# Patient Record
Sex: Male | Born: 1940 | Race: Black or African American | Hispanic: No | Smoking: Former smoker
Health system: Southern US, Community
[De-identification: ages and names within clinical notes are randomized; demographics above are authoritative.]

## PROBLEM LIST (undated history)

## (undated) DIAGNOSIS — I10 Essential (primary) hypertension: Secondary | ICD-10-CM

## (undated) DIAGNOSIS — K279 Peptic ulcer, site unspecified, unspecified as acute or chronic, without hemorrhage or perforation: Secondary | ICD-10-CM

## (undated) DIAGNOSIS — Z789 Other specified health status: Secondary | ICD-10-CM

## (undated) DIAGNOSIS — I2510 Atherosclerotic heart disease of native coronary artery without angina pectoris: Secondary | ICD-10-CM

## (undated) DIAGNOSIS — C801 Malignant (primary) neoplasm, unspecified: Secondary | ICD-10-CM

## (undated) DIAGNOSIS — J189 Pneumonia, unspecified organism: Secondary | ICD-10-CM

## (undated) DIAGNOSIS — J449 Chronic obstructive pulmonary disease, unspecified: Secondary | ICD-10-CM

## (undated) DIAGNOSIS — R918 Other nonspecific abnormal finding of lung field: Secondary | ICD-10-CM

## (undated) DIAGNOSIS — M1990 Unspecified osteoarthritis, unspecified site: Secondary | ICD-10-CM

## (undated) DIAGNOSIS — E119 Type 2 diabetes mellitus without complications: Secondary | ICD-10-CM

## (undated) DIAGNOSIS — F17200 Nicotine dependence, unspecified, uncomplicated: Secondary | ICD-10-CM

## (undated) DIAGNOSIS — R0600 Dyspnea, unspecified: Secondary | ICD-10-CM

## (undated) DIAGNOSIS — E785 Hyperlipidemia, unspecified: Secondary | ICD-10-CM

## (undated) DIAGNOSIS — M109 Gout, unspecified: Secondary | ICD-10-CM

## (undated) DIAGNOSIS — K922 Gastrointestinal hemorrhage, unspecified: Secondary | ICD-10-CM

## (undated) HISTORY — DX: Type 2 diabetes mellitus without complications: E11.9

## (undated) HISTORY — DX: Peptic ulcer, site unspecified, unspecified as acute or chronic, without hemorrhage or perforation: K27.9

## (undated) HISTORY — DX: Essential (primary) hypertension: I10

## (undated) HISTORY — DX: Hyperlipidemia, unspecified: E78.5

## (undated) HISTORY — PX: CARDIAC CATHETERIZATION: SHX172

## (undated) HISTORY — DX: Nicotine dependence, unspecified, uncomplicated: F17.200

## (undated) HISTORY — DX: Gout, unspecified: M10.9

## (undated) HISTORY — DX: Atherosclerotic heart disease of native coronary artery without angina pectoris: I25.10

## (undated) HISTORY — DX: Gastrointestinal hemorrhage, unspecified: K92.2

## (undated) HISTORY — PX: CORONARY STENT PLACEMENT: SHX1402

---

## 2003-08-08 ENCOUNTER — Encounter: Payer: Self-pay | Admitting: Cardiology

## 2003-09-26 ENCOUNTER — Ambulatory Visit (HOSPITAL_COMMUNITY): Admission: RE | Admit: 2003-09-26 | Discharge: 2003-09-28 | Payer: Self-pay | Admitting: Cardiology

## 2004-10-28 ENCOUNTER — Ambulatory Visit: Payer: Self-pay | Admitting: Endocrinology

## 2004-11-05 ENCOUNTER — Ambulatory Visit: Payer: Self-pay | Admitting: Endocrinology

## 2004-11-12 ENCOUNTER — Ambulatory Visit: Payer: Self-pay | Admitting: Endocrinology

## 2004-12-24 ENCOUNTER — Ambulatory Visit: Payer: Self-pay | Admitting: Internal Medicine

## 2004-12-31 ENCOUNTER — Ambulatory Visit: Payer: Self-pay | Admitting: Endocrinology

## 2005-01-04 ENCOUNTER — Encounter: Payer: Self-pay | Admitting: Cardiovascular Disease

## 2005-01-04 ENCOUNTER — Ambulatory Visit: Payer: Self-pay | Admitting: Internal Medicine

## 2005-01-04 ENCOUNTER — Ambulatory Visit: Payer: Self-pay

## 2005-01-06 ENCOUNTER — Ambulatory Visit: Payer: Self-pay | Admitting: Internal Medicine

## 2005-01-07 ENCOUNTER — Ambulatory Visit: Payer: Self-pay | Admitting: Internal Medicine

## 2005-01-10 ENCOUNTER — Ambulatory Visit: Payer: Self-pay | Admitting: *Deleted

## 2005-01-11 ENCOUNTER — Ambulatory Visit (HOSPITAL_COMMUNITY): Admission: RE | Admit: 2005-01-11 | Discharge: 2005-01-12 | Payer: Self-pay | Admitting: *Deleted

## 2005-01-27 ENCOUNTER — Ambulatory Visit: Payer: Self-pay | Admitting: Internal Medicine

## 2005-03-11 ENCOUNTER — Ambulatory Visit: Payer: Self-pay | Admitting: Internal Medicine

## 2005-04-08 ENCOUNTER — Ambulatory Visit: Payer: Self-pay | Admitting: Internal Medicine

## 2005-08-05 ENCOUNTER — Ambulatory Visit: Payer: Self-pay | Admitting: Internal Medicine

## 2005-09-05 ENCOUNTER — Ambulatory Visit: Payer: Self-pay | Admitting: Endocrinology

## 2005-11-11 ENCOUNTER — Ambulatory Visit: Payer: Self-pay | Admitting: Endocrinology

## 2005-11-18 ENCOUNTER — Ambulatory Visit: Payer: Self-pay | Admitting: Endocrinology

## 2005-12-16 ENCOUNTER — Ambulatory Visit: Payer: Self-pay | Admitting: Endocrinology

## 2005-12-29 ENCOUNTER — Ambulatory Visit: Payer: Self-pay | Admitting: Endocrinology

## 2006-03-06 ENCOUNTER — Ambulatory Visit (HOSPITAL_COMMUNITY): Admission: RE | Admit: 2006-03-06 | Discharge: 2006-03-06 | Payer: Self-pay | Admitting: Endocrinology

## 2006-03-06 ENCOUNTER — Ambulatory Visit: Payer: Self-pay | Admitting: Endocrinology

## 2006-04-07 ENCOUNTER — Ambulatory Visit: Payer: Self-pay | Admitting: Endocrinology

## 2006-04-25 ENCOUNTER — Ambulatory Visit: Payer: Self-pay | Admitting: Endocrinology

## 2006-05-02 ENCOUNTER — Ambulatory Visit: Payer: Self-pay | Admitting: Internal Medicine

## 2006-05-18 ENCOUNTER — Ambulatory Visit: Payer: Self-pay | Admitting: Internal Medicine

## 2006-05-23 ENCOUNTER — Ambulatory Visit: Payer: Self-pay | Admitting: Endocrinology

## 2006-05-23 ENCOUNTER — Ambulatory Visit: Payer: Self-pay | Admitting: Internal Medicine

## 2006-06-19 ENCOUNTER — Ambulatory Visit: Payer: Self-pay | Admitting: Internal Medicine

## 2006-06-19 ENCOUNTER — Encounter (INDEPENDENT_AMBULATORY_CARE_PROVIDER_SITE_OTHER): Payer: Self-pay | Admitting: Specialist

## 2006-06-26 ENCOUNTER — Ambulatory Visit: Payer: Self-pay | Admitting: Internal Medicine

## 2006-11-28 ENCOUNTER — Ambulatory Visit: Payer: Self-pay | Admitting: Endocrinology

## 2006-11-28 LAB — CONVERTED CEMR LAB
Albumin: 3.8 g/dL (ref 3.5–5.2)
Alkaline Phosphatase: 80 units/L (ref 39–117)
Basophils Absolute: 0.1 10*3/uL (ref 0.0–0.1)
CO2: 28 meq/L (ref 19–32)
Glomerular Filtration Rate, Af Am: 126 mL/min/{1.73_m2}
Glucose, Bld: 106 mg/dL — ABNORMAL HIGH (ref 70–99)
Ketones, ur: NEGATIVE mg/dL
LDL Cholesterol: 54 mg/dL (ref 0–99)
Lymphocytes Relative: 33.6 % (ref 12.0–46.0)
Monocytes Relative: 14.3 % — ABNORMAL HIGH (ref 3.0–11.0)
Nitrite: NEGATIVE
Platelets: 331 10*3/uL (ref 150–400)
Potassium: 4 meq/L (ref 3.5–5.1)
Sodium: 136 meq/L (ref 135–145)
Specific Gravity, Urine: 1.02 (ref 1.000–1.03)
TSH: 0.95 microintl units/mL (ref 0.35–5.50)
Total Bilirubin: 0.6 mg/dL (ref 0.3–1.2)
Total Protein: 7.2 g/dL (ref 6.0–8.3)
Uric Acid, Serum: 3.9 mg/dL (ref 2.4–7.0)
Urobilinogen, UA: 0.2 (ref 0.0–1.0)
VLDL: 7 mg/dL (ref 0–40)
pH: 6 (ref 5.0–8.0)

## 2006-12-08 ENCOUNTER — Ambulatory Visit: Payer: Self-pay | Admitting: Endocrinology

## 2007-02-09 ENCOUNTER — Ambulatory Visit: Payer: Self-pay | Admitting: Internal Medicine

## 2007-03-09 ENCOUNTER — Ambulatory Visit: Payer: Self-pay | Admitting: Endocrinology

## 2007-09-14 ENCOUNTER — Ambulatory Visit: Payer: Self-pay | Admitting: Internal Medicine

## 2007-10-10 ENCOUNTER — Ambulatory Visit: Payer: Self-pay

## 2007-10-10 ENCOUNTER — Ambulatory Visit: Payer: Self-pay | Admitting: Internal Medicine

## 2007-10-10 LAB — CONVERTED CEMR LAB
HDL: 57.1 mg/dL (ref >39.0)
LDL Cholesterol: 58 mg/dL (ref 0–99)
Triglycerides: 52 mg/dL (ref 0–149)
VLDL: 10 mg/dL (ref 0–40)

## 2008-01-21 ENCOUNTER — Ambulatory Visit: Payer: Self-pay | Admitting: Endocrinology

## 2008-01-21 DIAGNOSIS — M109 Gout, unspecified: Secondary | ICD-10-CM

## 2008-01-21 LAB — CONVERTED CEMR LAB
ALT: 18 units/L (ref 0–53)
AST: 20 units/L (ref 0–37)
Albumin: 3.4 g/dL — ABNORMAL LOW (ref 3.5–5.2)
Bilirubin, Direct: 0.1 mg/dL (ref 0.0–0.3)
Calcium: 9.1 mg/dL (ref 8.4–10.5)
Chloride: 95 meq/L — ABNORMAL LOW (ref 96–112)
Cholesterol: 110 mg/dL (ref 0–200)
Creatinine, Ser: 0.9 mg/dL (ref 0.4–1.5)
Creatinine,U: 154 mg/dL
Eosinophils Relative: 2.4 % (ref 0.0–5.0)
Glucose, Bld: 111 mg/dL — ABNORMAL HIGH (ref 70–99)
HCT: 41.3 % (ref 39.0–52.0)
Ketones, ur: NEGATIVE mg/dL
LDL Cholesterol: 53 mg/dL (ref 0–99)
Neutrophils Relative %: 55.6 % (ref 43.0–77.0)
Nitrite: NEGATIVE
RBC: 4.24 M/uL (ref 4.22–5.81)
RDW: 14.1 % (ref 11.5–14.6)
Sodium: 139 meq/L (ref 135–145)
Specific Gravity, Urine: 1.015 (ref 1.000–1.03)
TSH: 0.83 microintl units/mL (ref 0.35–5.50)
Total Bilirubin: 0.6 mg/dL (ref 0.3–1.2)
Total Protein, Urine: NEGATIVE mg/dL
Uric Acid, Serum: 5.1 mg/dL (ref 2.4–7.0)
Urobilinogen, UA: 0.2 (ref 0.0–1.0)
VLDL: 11 mg/dL (ref 0–40)
WBC: 11.8 10*3/uL — ABNORMAL HIGH (ref 4.5–10.5)
pH: 7 (ref 5.0–8.0)

## 2008-01-28 ENCOUNTER — Ambulatory Visit: Payer: Self-pay | Admitting: Endocrinology

## 2008-01-28 DIAGNOSIS — R05 Cough: Secondary | ICD-10-CM | POA: Insufficient documentation

## 2008-01-28 DIAGNOSIS — R059 Cough, unspecified: Secondary | ICD-10-CM | POA: Insufficient documentation

## 2008-03-31 ENCOUNTER — Ambulatory Visit: Payer: Self-pay | Admitting: Internal Medicine

## 2008-05-14 ENCOUNTER — Ambulatory Visit: Payer: Self-pay | Admitting: Internal Medicine

## 2008-05-14 DIAGNOSIS — E785 Hyperlipidemia, unspecified: Secondary | ICD-10-CM

## 2008-05-14 DIAGNOSIS — K279 Peptic ulcer, site unspecified, unspecified as acute or chronic, without hemorrhage or perforation: Secondary | ICD-10-CM

## 2008-05-14 DIAGNOSIS — I10 Essential (primary) hypertension: Secondary | ICD-10-CM

## 2008-05-14 DIAGNOSIS — M79609 Pain in unspecified limb: Secondary | ICD-10-CM

## 2008-05-14 DIAGNOSIS — I2510 Atherosclerotic heart disease of native coronary artery without angina pectoris: Secondary | ICD-10-CM | POA: Insufficient documentation

## 2008-09-23 ENCOUNTER — Ambulatory Visit: Payer: Self-pay | Admitting: Endocrinology

## 2008-09-23 LAB — CONVERTED CEMR LAB: Hgb A1c MFr Bld: 6.6 % — ABNORMAL HIGH (ref 4.6–6.0)

## 2008-10-27 ENCOUNTER — Ambulatory Visit: Payer: Self-pay | Admitting: Internal Medicine

## 2008-11-28 ENCOUNTER — Telehealth (INDEPENDENT_AMBULATORY_CARE_PROVIDER_SITE_OTHER): Payer: Self-pay | Admitting: *Deleted

## 2009-03-24 ENCOUNTER — Ambulatory Visit: Payer: Self-pay | Admitting: Endocrinology

## 2009-03-26 LAB — CONVERTED CEMR LAB: Uric Acid, Serum: 4.3 mg/dL (ref 4.0–7.8)

## 2009-05-29 ENCOUNTER — Ambulatory Visit: Payer: Self-pay | Admitting: Internal Medicine

## 2009-06-03 ENCOUNTER — Ambulatory Visit: Payer: Self-pay | Admitting: Endocrinology

## 2009-06-03 DIAGNOSIS — E876 Hypokalemia: Secondary | ICD-10-CM | POA: Insufficient documentation

## 2009-06-03 LAB — CONVERTED CEMR LAB
ALT: 21 units/L (ref 0–53)
AST: 23 units/L (ref 0–37)
Albumin: 3.7 g/dL (ref 3.5–5.2)
Alkaline Phosphatase: 79 units/L (ref 39–117)
Basophils Relative: 1.2 % (ref 0.0–3.0)
Calcium: 9.2 mg/dL (ref 8.4–10.5)
Eosinophils Relative: 2.4 % (ref 0.0–5.0)
GFR calc non Af Amer: 173.61 mL/min (ref >60)
Glucose, Bld: 91 mg/dL (ref 70–99)
HCT: 39.8 % (ref 39.0–52.0)
HDL: 57.8 mg/dL (ref >39.00)
Hemoglobin: 13.7 g/dL (ref 13.0–17.0)
Hgb A1c MFr Bld: 6.5 % (ref 4.6–6.5)
Lymphocytes Relative: 31.7 % (ref 12.0–46.0)
Lymphs Abs: 2.7 10*3/uL (ref 0.7–4.0)
Microalb Creat Ratio: 13.1 mg/g (ref 0.0–30.0)
Microalb, Ur: 1.5 mg/dL (ref 0.0–1.9)
Monocytes Relative: 15.2 % — ABNORMAL HIGH (ref 3.0–12.0)
Neutro Abs: 4.2 10*3/uL (ref 1.4–7.7)
PSA: 3 ng/mL (ref 0.10–4.00)
Potassium: 3.4 meq/L — ABNORMAL LOW (ref 3.5–5.1)
RBC: 4.04 M/uL — ABNORMAL LOW (ref 4.22–5.81)
Sodium: 140 meq/L (ref 135–145)
TSH: 0.56 microintl units/mL (ref 0.35–5.50)
Total CHOL/HDL Ratio: 2
WBC: 8.5 10*3/uL (ref 4.5–10.5)

## 2009-07-23 ENCOUNTER — Ambulatory Visit: Payer: Self-pay | Admitting: Internal Medicine

## 2009-07-23 LAB — CONVERTED CEMR LAB
BUN: 14 mg/dL (ref 6–23)
Chloride: 102 meq/L (ref 96–112)
Creatinine, Ser: 0.9 mg/dL (ref 0.4–1.5)
GFR calc non Af Amer: 108.69 mL/min (ref >60)
Glucose, Bld: 82 mg/dL (ref 70–99)
Potassium: 3.6 meq/L (ref 3.5–5.1)

## 2009-10-01 ENCOUNTER — Ambulatory Visit: Payer: Self-pay | Admitting: Endocrinology

## 2009-10-01 DIAGNOSIS — L918 Other hypertrophic disorders of the skin: Secondary | ICD-10-CM

## 2009-10-01 DIAGNOSIS — L908 Other atrophic disorders of skin: Secondary | ICD-10-CM

## 2009-10-07 ENCOUNTER — Telehealth (INDEPENDENT_AMBULATORY_CARE_PROVIDER_SITE_OTHER): Payer: Self-pay | Admitting: *Deleted

## 2010-01-26 ENCOUNTER — Telehealth (INDEPENDENT_AMBULATORY_CARE_PROVIDER_SITE_OTHER): Payer: Self-pay | Admitting: *Deleted

## 2010-03-15 ENCOUNTER — Ambulatory Visit: Payer: Self-pay | Admitting: Internal Medicine

## 2010-03-15 DIAGNOSIS — R5381 Other malaise: Secondary | ICD-10-CM

## 2010-03-15 DIAGNOSIS — I2510 Atherosclerotic heart disease of native coronary artery without angina pectoris: Secondary | ICD-10-CM

## 2010-03-15 DIAGNOSIS — R5383 Other fatigue: Secondary | ICD-10-CM

## 2010-03-15 DIAGNOSIS — F17200 Nicotine dependence, unspecified, uncomplicated: Secondary | ICD-10-CM

## 2010-03-16 ENCOUNTER — Telehealth: Payer: Self-pay | Admitting: Internal Medicine

## 2010-04-19 ENCOUNTER — Encounter: Payer: Self-pay | Admitting: Endocrinology

## 2010-07-14 ENCOUNTER — Ambulatory Visit: Payer: Self-pay | Admitting: Endocrinology

## 2010-07-14 DIAGNOSIS — R21 Rash and other nonspecific skin eruption: Secondary | ICD-10-CM | POA: Insufficient documentation

## 2010-08-11 ENCOUNTER — Ambulatory Visit: Payer: Self-pay | Admitting: Endocrinology

## 2010-08-11 ENCOUNTER — Encounter: Payer: Self-pay | Admitting: Endocrinology

## 2010-08-11 DIAGNOSIS — R972 Elevated prostate specific antigen [PSA]: Secondary | ICD-10-CM

## 2010-08-11 LAB — CONVERTED CEMR LAB
Bilirubin Urine: NEGATIVE
Creatinine,U: 42.8 mg/dL
Hemoglobin, Urine: NEGATIVE
Hgb A1c MFr Bld: 6.6 % — ABNORMAL HIGH (ref 4.6–6.5)
Microalb Creat Ratio: 3.5 mg/g (ref 0.0–30.0)
Microalb, Ur: 1.5 mg/dL (ref 0.0–1.9)
Nitrite: NEGATIVE
Total Protein, Urine: NEGATIVE mg/dL
Urobilinogen, UA: 0.2 (ref 0.0–1.0)

## 2010-09-08 ENCOUNTER — Encounter: Payer: Self-pay | Admitting: Endocrinology

## 2010-09-08 ENCOUNTER — Encounter: Payer: Self-pay | Admitting: Internal Medicine

## 2011-01-09 LAB — CONVERTED CEMR LAB
AST: 25 units/L (ref 0–37)
BUN: 13 mg/dL (ref 6–23)
Basophils Absolute: 0.1 10*3/uL (ref 0.0–0.1)
CO2: 35 meq/L — ABNORMAL HIGH (ref 19–32)
Calcium: 8.7 mg/dL (ref 8.4–10.5)
Calcium: 9 mg/dL (ref 8.4–10.5)
Cholesterol: 121 mg/dL (ref 0–200)
Creatinine, Ser: 0.8 mg/dL (ref 0.4–1.5)
Creatinine, Ser: 0.86 mg/dL (ref 0.40–1.50)
Eosinophils Absolute: 0.2 10*3/uL (ref 0.0–0.7)
GFR calc non Af Amer: 124.26 mL/min (ref >60)
Glucose, Bld: 105 mg/dL — ABNORMAL HIGH (ref 70–99)
HDL: 61.8 mg/dL (ref >39.00)
Lymphocytes Relative: 29.4 % (ref 12.0–46.0)
MCHC: 33.5 g/dL (ref 30.0–36.0)
Monocytes Relative: 13.7 % — ABNORMAL HIGH (ref 3.0–12.0)
Neutrophils Relative %: 53.4 % (ref 43.0–77.0)
Platelets: 306 10*3/uL (ref 150.0–400.0)
Potassium: 3.4 meq/L — ABNORMAL LOW (ref 3.5–5.3)
RDW: 15.8 % — ABNORMAL HIGH (ref 11.5–14.6)
Sodium: 142 meq/L (ref 135–145)
Triglycerides: 46 mg/dL (ref 0.0–149.0)
VLDL: 9.2 mg/dL (ref 0.0–40.0)

## 2011-01-13 NOTE — Progress Notes (Signed)
Summary: PT WANTS LAB RESULTS   Phone Note Call from Patient Call back at Home Phone [phone]   Reason for Call: Talk to Nurse, Talk to Doctor, Lab or Test Results Summary of Call: PT CALL TO GET LAB RESULTS FROM YESTERDAY Initial call taken by: Omer Jack,  March 16, 2010 11:59 AM  Follow-up for Phone Call        [facility] with normal lab results. Follow-up by: Suzan Garibaldi RN

## 2011-01-13 NOTE — Letter (Signed)
Summary: [facility] Office Visit Note   [facility] Office Visit Note   Imported By: Roderic Ovens 09/27/2010 11:19:09  _____________________________________________________________________  External Attachment:    Type:   Image     Comment:   External Document

## 2011-01-13 NOTE — Progress Notes (Signed)
Summary: refill   Phone Note Refill Request Message from:  Patient on January 26, 2010 1:34 PM  Refills Requested: Medication #1:  PLAVIX 75 MG TABS 1 tab once daily [facility] [phone]  Initial call taken by: Judie Grieve,  January 26, 2010 1:35 PM  Follow-up for Phone Call        Rx faxed to pharmacy Follow-up by: Oswald Hillock,  January 27, 2010 10:51 AM    Prescriptions: PLAVIX 75 MG TABS (CLOPIDOGREL BISULFATE) 1 tab once daily  #30 x 6   Entered by:   Oswald Hillock   Authorized by:   Sherrill Raring, MD, [facility]   Signed by:   Oswald Hillock on 01/27/2010   Method used:   Electronically to        [facility] [phone]* (retail)       [address]       [place], [place]  [address]       Ph: [phone] or [phone]       Fax: [phone]   RxID:   [id number]

## 2011-01-13 NOTE — Letter (Signed)
Summary: [facility]   Imported By: Sherian Rein 09/13/2010 14:53:31  _____________________________________________________________________  External Attachment:    Type:   Image     Comment:   External Document

## 2011-01-13 NOTE — Miscellaneous (Signed)
  Medications Added VIAGRA 100 MG TABS (SILDENAFIL CITRATE) as dir       Clinical Lists Changes  Medications: Removed medication of CIALIS 10 MG TABS (TADALAFIL) 1 every day only as needed Added new medication of VIAGRA 100 MG TABS (SILDENAFIL CITRATE) as dir - Signed Rx of VIAGRA 100 MG TABS (SILDENAFIL CITRATE) as dir;  #10 x 0;  Signed;  Entered by: Minus Breeding MD;  Authorized by: Minus Breeding MD;  Method used: Electronically to [facility] [phone]*, [address], [place], [place], [place]  [address], Ph: [phone] or [phone], Fax: [phone]    Prescriptions: VIAGRA 100 MG TABS (SILDENAFIL CITRATE) as dir  #10 x 0   Entered and Authorized by:   Minus Breeding MD   Signed by:   Minus Breeding MD on 04/19/2010   Method used:   Electronically to        [facility] [phone]* (retail)       [address]       [place], [place]  [address]       Ph: [phone] or [phone]       Fax: [phone]   RxID:   [id number]

## 2011-01-13 NOTE — Assessment & Plan Note (Signed)
Summary: yearly f/u / # / cd   Vital Signs:  Patient profile:   70 year old male Height:      73 inches (185.42 cm) Weight:      175 pounds (79.55 kg) BMI:     23.17 O2 Sat:      93 % on Room air Temp:     97.5 degrees F (36.39 degrees C) oral Pulse rate:   93 / minute BP sitting:   128 / 76  (left arm) Cuff size:   large  Vitals Entered By: Brenton Grills MA (August 11, 2010 8:16 AM)  O2 Flow:  Room air CC: Yearly F/U/aj Is Patient Diabetic? Yes   Primary Provider:  dr [name]  CC:  Yearly F/U/aj.  History of Present Illness: here for regular wellness examination.  He's feeling pretty well in general.   Current Medications (verified): 1)  Allopurinol 300 Mg Tabs (Allopurinol) .... Take 1 By Mouth Qd 2)  Crestor 40 Mg Tabs (Rosuvastatin Calcium) .... Take 1 Tablet By Mouth Once A Day 3)  Plavix 75 Mg Tabs (Clopidogrel Bisulfate) .Marland Kitchen.. 1 Tab Once Daily 4)  Methocarbamol 500 Mg Tabs (Methocarbamol) .... At Bedtime As Needed Cramps 5)  Accu-Chek Compact  Strp (Glucose Blood) .... Once Daily, 250.00, and Lancets 6)  Aspirin 81 Mg Tbec (Aspirin) .... Take One Tablet By Mouth Daily 7)  Hyzaar 100-25 Mg Tabs (Losartan Potassium-Hctz) .... Take 1 Tablet Ever Day in The Am 8)  Klor-Con 10 10 Meq Cr-Tabs (Potassium Chloride) .Marland Kitchen.. 1 Qd 9)  Asmanex 30 Metered Doses 220 Mcg/inh Aepb (Mometasone Furoate) .Marland Kitchen.. 1 Puff Every Day 10)  Viagra 100 Mg Tabs (Sildenafil Citrate) .... As Dir 11)  Clotrimazole-Betamethasone 1-0.05 % Crea (Clotrimazole-Betamethasone) .... Three Times A Day As Needed For Rash  Allergies (verified): No Known Drug Allergies  Family History: Reviewed history from 01/28/2008 and no changes required. 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    Physical Exam  General:  normal appearance.   Head:  head: no deformity eyes: no periorbital swelling, no proptosis external nose and ears are normal mouth: no lesion seen Neck:  Supple without thyroid enlargement or tenderness.  Chest Wall:  anterior keloids Heart:  Regular rate and rhythm without murmurs or gallops noted. Normal S1,S2.   Abdomen:  abdomen is soft, nontender.  no hepatosplenomegaly.   not distended.  no hernia  Rectal:  normal external and internal exam.  heme neg  Msk:  muscle bulk and strength are grossly normal.  no obvious joint swelling.  gait is normal and steady  Pulses:  dorsalis pedis intact bilat.  no carotid bruit  Extremities:  no deformity.  no ulcer on the feet.  feet are of normal color and temp.  no edema mycotic toenails.   Neurologic:  cn 2-12 grossly intact.   readily moves all 4's.   sensation is intact to touch on the feet  Skin:  mild eczematous rash on the ankles not diaphoretic Cervical Nodes:  No significant adenopathy.  Psych:  Alert and cooperative; normal mood and affect; normal attention span and concentration.   Additional Exam:  SEPARATE EVALUATION  FOLLOWS--EACH PROBLEM HERE IS NEW, NOT RESPONDING TO TREATMENT, OR POSES SIGNIFICANT RISK TO THE PATIENT'S HEALTH: HISTORY OF THE PRESENT ILLNESS: pt states chronic dry cough in the chest, and slight assoc wheezing.  he says he misses the asmanex due to cost.  elev psa is noted PAST MEDICAL HISTORY reviewed and up to date today REVIEW OF SYSTEMS: denies hemoptysis PHYSICAL EXAMINATION: see vs page clear to auscultation.  no respiratory distress prostate:  normal LAB/XRAY RESULTS: psa=4.4 IMPRESSION: elev psa, uncertain etiology copd.  refer pulm is  refused PLAN: see instruction sheet   Impression & Recommendations:  Problem # 1:  ROUTINE GENERAL MEDICAL EXAM@HEALTH  CARE FACL (ICD-V70.0)  Other Orders: Spirometry w/Graph (94010) Pneumococcal Vaccine ([address]) Admin 1st Vaccine (40981) Admin 1st Vaccine (19147) Flu Vaccine 11yrs + (82956) T-2 View CXR (71020TC) TLB-Uric Acid, Blood (84550-URIC) TLB-A1C / Hgb A1C (Glycohemoglobin) (83036-A1C) TLB-Microalbumin/Creat Ratio, Urine (82043-MALB) TLB-Udip w/ Micro (81001-URINE) TLB-PSA (Prostate Specific Antigen) (84153-PSA) Urology Referral (Urology) Est. Patient Level III (21308) Est. Patient 65& > (65784)  Patient Instructions: 1)  blood tests and chest x ray are being ordered for you today.  please call [phone] to hear your test results. 2)  pending the test results, please continue the same medications for now 3)  please consider these measures for your health:  minimize alcohol.  do not use tobacco products.  have a colonoscopy at least every 10 years from age 85.  keep firearms safely stored.  always use seat belts.  have working smoke alarms in your home.  see an eye doctor and dentist regularly.  never drive under the influence of alcohol or drugs (including prescription drugs).   4)  please let me know what your wishes would be, if artificial life support measures should become necessary.  it is critically important to prevent falling down (keep floor areas well-lit, dry, and free of loose objects) 5)  (update: i left message on phone-tree:  red urol)     Orders Added: 1)  Spirometry w/Graph [94010] 2)  Pneumococcal Vaccine [90732] 3)  Admin 1st Vaccine [90471] 4)  Admin 1st Vaccine [90471] 5)  Flu Vaccine 58yrs + [90658] 6)  T-2 View CXR [71020TC] 7)  TLB-Uric Acid, Blood [84550-URIC] 8)  TLB-A1C / Hgb A1C (Glycohemoglobin) [83036-A1C] 9)  TLB-Microalbumin/Creat Ratio, Urine [82043-MALB] 10)  TLB-Udip w/ Micro [81001-URINE] 11)  TLB-PSA (Prostate Specific Antigen)  [84132-GMW] 12)  Urology Referral [Urology] 13)  Est. Patient Level III [10272] 14)  Est. Patient 65& > [53664]    Immunizations Administered:  Pneumonia Vaccine:    Vaccine Type: Pneumovax    Site: right deltoid    Mfr: Merck    Dose: 0.5 ml    Route: IM    Given by: Brenton Grills MA    Exp. Date: 01/02/2012    Lot #: [id number]    VIS given: 07/09/96 version given August 11, 2010. Flu Vaccine Consent Questions     Do you have a history of severe allergic reactions to this vaccine? no    Any prior history of allergic reactions to egg and/or gelatin? no    Do you have a sensitivity to the preservative Thimersol? no    Do you have a past history of Guillan-Barre Syndrome? no    Do you currently have an acute febrile illness? no    Have you ever had a severe reaction to latex? no    Vaccine information given and explained to patient? yes    Are you currently pregnant?  no    Lot Number:[id number]   Exp Date:06/11/2011   Site Given  Left Deltoid IM    Route: IM    Given by: Brenton Grills MA    Exp. Date: 01/02/2012    Lot #: [id number]    VIS given: 07/09/96 version given August 11, 2010.    Marland Kitchenlbflu

## 2011-01-13 NOTE — Assessment & Plan Note (Signed)
Summary: DR SAE PT/NO  CLINIC --ITCHY FOOT RASH--STC   Vital Signs:  Patient profile:   70 year old male Height:      73 inches (185.42 cm) Weight:      178 pounds (80.91 kg) BMI:     23.57 O2 Sat:      93 % on Room air Temp:     98.3 degrees F (36.83 degrees C) oral Pulse rate:   101 / minute BP sitting:   134 / 66  (left arm) Cuff size:   large  Vitals Entered By: Brenton Grills MA (July 14, 2010 10:57 AM)  O2 Flow:  Room air CC: rash on both feet x 1 week/has used cortizone cream on rash with some relief/aj   Primary Provider:  dr [name]  CC:  rash on both feet x 1 week/has used cortizone cream on rash with some relief/aj.  History of Present Illness: pt states 1 week of slight rash on both feet, and associated itching.    Current Medications (verified): 1)  Allopurinol 300 Mg Tabs (Allopurinol) .... Take 1 By Mouth Qd 2)  Crestor 40 Mg Tabs (Rosuvastatin Calcium) .... Take 1 Tablet By Mouth Once A Day 3)  Plavix 75 Mg Tabs (Clopidogrel Bisulfate) .Marland Kitchen.. 1 Tab Once Daily 4)  Methocarbamol 500 Mg Tabs (Methocarbamol) .... At Bedtime As Needed Cramps 5)  Accu-Chek Compact  Strp (Glucose Blood) .... Once Daily, 250.00, and Lancets 6)  Aspirin 81 Mg Tbec (Aspirin) .... Take One Tablet By Mouth Daily 7)  Hyzaar 100-25 Mg Tabs (Losartan Potassium-Hctz) .... Take 1 Tablet Ever Day in The Am 8)  Klor-Con 10 10 Meq Cr-Tabs (Potassium Chloride) .Marland Kitchen.. 1 Qd 9)  Asmanex 30 Metered Doses 220 Mcg/inh Aepb (Mometasone Furoate) .Marland Kitchen.. 1 Puff Every Day 10)  Viagra 100 Mg Tabs (Sildenafil Citrate) .... As Dir  Allergies (verified): No Known Drug Allergies  Past History:  Past Medical History: Last updated: 09/23/2008 Peptic ulcer disease - per pt hx  LEG PAIN (ICD-729.5) PEPTIC ULCER DISEASE (ICD-533.90) CORONARY ARTERY DISEASE (ICD-414.00) HYPERLIPIDEMIA (ICD-272.4) HYPERTENSION (ICD-401.9) COUGH (ICD-786.2) ROUTINE GENERAL MEDICAL EXAM@HEALTH  CARE FACL (ICD-V70.0) AODM  (ICD-250.00) GOUT, UNSPECIFIED (ICD-274.9) OBSERVATION FOR SUSPECTED MALIGNANT NEOPLASM (ICD-V71.1)  Review of Systems  The patient denies fever.    Physical Exam  General:  normal appearance.   Pulses:  dorsalis pedis intact bilat.   Extremities:  no deformity.  no ulcer on the feet.  feet are of normal color and temp.  no edema. there is a mild polymacular rash on the dorsal aspect of the feet.  there is a 1-cm area of avulsed skin (pt says due to scratching) on the dorsal aspect of the left foot.  no erythema.  Neurologic:  sensation is intact to touch on the feet    Impression & Recommendations:  Problem # 1:  SKIN RASH (ICD-782.1) Assessment New  Medications Added to Medication List This Visit: 1)  Clotrimazole-betamethasone 1-0.05 % Crea (Clotrimazole-betamethasone) .... Three times a day as needed for rash  Other Orders: Est. Patient Level III (62376)  Patient Instructions: 1)  betamethasone-clotrimazole cream three times a day as needed for rash. 2)  Please schedule a physical appointment in 1 month. Prescriptions: CLOTRIMAZOLE-BETAMETHASONE 1-0.05 % CREA (CLOTRIMAZOLE-BETAMETHASONE) three times a day as needed for rash  #1 med tube x 1   Entered and Authorized by:   Minus Breeding MD   Signed by:   Minus Breeding MD on 07/14/2010   Method used:  [facility] to        [facility] [phone]* (retail)       [address]       [place], [place]  [address]       Ph: [phone] or [phone]       Fax: [phone]   RxID:   [id number]

## 2011-01-13 NOTE — Assessment & Plan Note (Signed)
Summary: rov/dmp  Medications Added AMLODIPINE BESYLATE 10 MG TABS (AMLODIPINE BESYLATE) Take one tablet by mouth daily Pt. is out of this Med. HYZAAR 100-25 MG TABS (LOSARTAN POTASSIUM-HCTZ) take 1 tablet ever day in the am ASMANEX 30 METERED DOSES 220 MCG/INH AEPB (MOMETASONE FUROATE) 1 puff every day        Visit Type:  Follow-up Primary Provider:  dr [name]  CC:  cough.  History of Present Illness: Patient is a 70 year old with a history of CAD (last PTCA/stent to the RCA for instent restenosis.)  Also a hx of HTN, dyslipidemia, tobacco use   He denies chest pain.  Breathing is unchanged.  He does feel tired.  COntinues to smoke about 1 ppd.  Did not tolerate Chantix.  Problems Prior to Update: 1)  Other Malaise and Fatigue  (ICD-780.79) 2)  Other Spec Hypertrophic&atrophic Condition Skin  (ICD-701.8) 3)  Hypokalemia  (ICD-276.8) 4)  Encounter For Long-term Use of Other Medications  (ICD-V58.69) 5)  Leg Pain  (ICD-729.5) 6)  Peptic Ulcer Disease  (ICD-533.90) 7)  Coronary Artery Disease  (ICD-414.00) 8)  Hyperlipidemia  (ICD-272.4) 9)  Hypertension  (ICD-401.9) 10)  Cough  (ICD-786.2) 11)  Routine General Medical Exam@health  Care Facl  (ICD-V70.0) 12)  Aodm  (ICD-250.00) 13)  Gout, Unspecified  (ICD-274.9) 14)  Observation For Suspected Malignant Neoplasm  (ICD-V71.1)  Current Medications (verified): 1)  Allopurinol 300 Mg Tabs (Allopurinol) .... Take 1 By Mouth Qd 2)  Crestor 40 Mg Tabs (Rosuvastatin Calcium) .... Take 1 Tablet By Mouth Once A Day 3)  Plavix 75 Mg Tabs (Clopidogrel Bisulfate) .Marland Kitchen.. 1 Tab Once Daily 4)  Amlodipine Besylate 10 Mg Tabs (Amlodipine Besylate) .... Take One Tablet By Mouth Daily Pt. Is Out of This Med. 5)  Methocarbamol 500 Mg Tabs (Methocarbamol) .... At Bedtime As Needed Cramps 6)  Accu-Chek Compact  Strp (Glucose Blood) .... Once Daily, 250.00, and Lancets 7)  Aspirin 81 Mg Tbec (Aspirin) .... Take One Tablet By Mouth Daily 8)  Hyzaar  100-25 Mg Tabs (Losartan Potassium-Hctz) .... Take 1 Tablet Ever Day in The Am 9)  Klor-Con 10 10 Meq Cr-Tabs (Potassium Chloride) .Marland Kitchen.. 1 Qd 10)  Cialis 10 Mg Tabs (Tadalafil) .Marland Kitchen.. 1 Every Day Only As Needed  Allergies: No Known Drug Allergies  Past History:  Past medical, surgical, family and social histories (including risk factors) reviewed, and no changes noted (except as noted below).  Past Medical History: Reviewed history from 09/23/2008 and no changes required. Peptic ulcer disease - per pt hx  LEG PAIN (ICD-729.5) PEPTIC ULCER DISEASE (ICD-533.90) CORONARY ARTERY DISEASE (ICD-414.00) HYPERLIPIDEMIA (ICD-272.4) HYPERTENSION (ICD-401.9) COUGH (ICD-786.2) ROUTINE GENERAL MEDICAL EXAM@HEALTH  CARE FACL (ICD-V70.0) AODM (ICD-250.00) GOUT, UNSPECIFIED (ICD-274.9) OBSERVATION FOR SUSPECTED MALIGNANT NEOPLASM (ICD-V71.1)  Past Surgical History: Reviewed history from 05/14/2008 and no changes required. s/p stent coronary  Family History: Reviewed history from 01/28/2008 and no changes required. 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  Neg to the above problem except as noted above.  Vital Signs:  Patient profile:   70 year old male Height:      73 inches Weight:      182 pounds BMI:     24.10 Pulse rate:   92 / minute BP sitting:   125 / 65  (left  arm) Cuff size:   large  Vitals Entered By: Burnett Kanaris, CNA (March 15, 2010 8:40 AM)  Physical Exam  Additional Exam:  Patient is in NAD HEENT:  Normocephalic, atraumatic. EOMI, PERRLA.  Neck: JVP is normal. No thyromegaly. No bruits.  Lungs: Decreased airflow with exp wheezes. Heart: Regular rate and rhythm. Normal S1, S2. No S3.   No significant murmurs. PMI not displaced.  Abdomen:  Supple, nontender.  Normal bowel sounds. No masses. No hepatomegaly.  Extremities:   Good distal pulses throughout. No lower extremity edema.  Musculoskeletal :moving all extremities.  Neuro:   alert and oriented x3.    EKG  Procedure date:  03/15/2010  Findings:      NSR.  92 bpm.  Impression & Recommendations:  Problem # 1:  CAD, NATIVE VESSEL (ICD-414.01) No symptoms to suggest active ischemia.  Keep on same regimen.  Problem # 2:  HYPERTENSION (ICD-401.9) Has been out of amlodipine for 2 to 3 months.  He checks bp and it is around 120s systolic.  will not refill.  Problem # 3:  HYPERLIPIDEMIA (ICD-272.4) Check fasting today. His updated medication list for this problem includes:    Crestor 40 Mg Tabs (Rosuvastatin calcium) .Marland Kitchen... Take 1 tablet by mouth once a day  Orders: TLB-Lipid Panel (80061-LIPID) TLB-AST (SGOT) (84450-SGOT)  Problem # 4:  HYPOKALEMIA (ICD-276.8) Check today.  Problem # 5:  OTHER MALAISE AND FATIGUE (ICD-780.79) Check labs. TLB-CBC Platelet - w/Differential (85025-CBCD) TLB-TSH (Thyroid Stimulating Hormone) (84443-TSH)  Problem # 6:  TOBACCO ABUSE (ICD-305.1) Counselled.  Given Rx for Asmanex since he is wheezing.  Needs to quit tobacco.  Other Orders: EKG w/ Interpretation (93000) TLB-BMP (Basic Metabolic Panel-BMET) (80048-METABOL)  Patient Instructions: 1)  Your physician recommends that you return for lab work in: lab work today...we will call you with results 2)  Your physician wants you to follow-up in: 9 months  You will receive a reminder letter in the mail two months in advance. If you don't receive a letter, please call our office to schedule the follow-up appointment. Prescriptions: ASMANEX 30 METERED DOSES 220 MCG/INH AEPB (MOMETASONE FUROATE) 1 puff every day  #1 x 12   Entered by:   Layne Benton, RN, BSN   Authorized by:   Sherrill Raring, MD, [facility]   Signed by:   Layne Benton, RN, BSN on 03/15/2010   Method used:   Electronically to         [facility] [phone]* (retail)       [address]       [place], [place]  [address]       Ph: [phone] or [phone]       Fax: [phone]   RxID:   [id number]

## 2011-01-18 ENCOUNTER — Telehealth: Payer: Self-pay | Admitting: Internal Medicine

## 2011-01-27 NOTE — Progress Notes (Signed)
Summary: RX REFILL   Phone Note Refill Request Message from:  Patient on January 18, 2011 3:34 PM  Refills Requested: Medication #1:  PLAVIX 75 MG TABS 1 tab once daily  Method Requested: Telephone to Pharmacy Initial call taken by: Roe Coombs,  January 18, 2011 3:34 PM

## 2011-03-25 ENCOUNTER — Other Ambulatory Visit: Payer: Self-pay | Admitting: Endocrinology

## 2011-04-14 ENCOUNTER — Encounter: Payer: Self-pay | Admitting: Internal Medicine

## 2011-04-14 ENCOUNTER — Other Ambulatory Visit: Payer: Self-pay | Admitting: Endocrinology

## 2011-04-15 ENCOUNTER — Ambulatory Visit (INDEPENDENT_AMBULATORY_CARE_PROVIDER_SITE_OTHER): Payer: BC Managed Care – PPO | Admitting: Internal Medicine

## 2011-04-15 ENCOUNTER — Encounter: Payer: Self-pay | Admitting: Internal Medicine

## 2011-04-15 VITALS — BP 142/64 | HR 102 | Resp 18 | Ht 73.0 in | Wt 173.1 lb

## 2011-04-15 DIAGNOSIS — I10 Essential (primary) hypertension: Secondary | ICD-10-CM

## 2011-04-15 DIAGNOSIS — I2510 Atherosclerotic heart disease of native coronary artery without angina pectoris: Secondary | ICD-10-CM

## 2011-04-15 DIAGNOSIS — E785 Hyperlipidemia, unspecified: Secondary | ICD-10-CM

## 2011-04-15 NOTE — Progress Notes (Signed)
HPIPatient is a 70 year old with a history of CAD (last PTCA/stent to the RCA for instent restenosis.)  Also a hx of HTN, dyslipidemia, tobacco use   He denies chest pain.  Breathing is unchanged.  He does feel tired.  COntinues to smoke about 1 ppd.  Did not tolerate Chantix.   No Known Allergies  Current Outpatient Prescriptions  Medication Sig Dispense Refill  . allopurinol (ZYLOPRIM) 300 MG tablet Take 300 mg by mouth daily.        Marland Kitchen aspirin 81 MG tablet Take 81 mg by mouth daily.        . clopidogrel (PLAVIX) 75 MG tablet Take 75 mg by mouth daily.        Marland Kitchen glucose blood (ACCU-CHEK COMPACT STRIPS) test strip Once daily, dx 250.00       . losartan-hydrochlorothiazide (HYZAAR) 100-25 MG per tablet Take 1 tablet by mouth daily.        . methocarbamol (ROBAXIN) 500 MG tablet 1 tablet at bedtime as needed for cramps      . potassium chloride (KLOR-CON) 10 MEQ CR tablet Take 10 mEq by mouth daily.        . rosuvastatin (CRESTOR) 40 MG tablet Take 40 mg by mouth daily.        Marland Kitchen VIAGRA 100 MG tablet TAKE AS NEEDED  1 tablet  2  . DISCONTD: ASMANEX 30 METERED DOSES 220 MCG/INH inhaler 1 PUFF EVERY DAY  0.24 g  3    Past Medical History  Diagnosis Date  . CAD, NATIVE VESSEL 03/15/2010  . CORONARY ARTERY DISEASE 05/14/2008  . Gout, unspecified 01/21/2008  . HYPERLIPIDEMIA 05/14/2008  . HYPERTENSION 05/14/2008  . HYPOKALEMIA 06/03/2009  . Other malaise and fatigue 03/15/2010  . PEPTIC ULCER DISEASE 05/14/2008  . TOBACCO ABUSE 03/15/2010  . AODM 01/21/2008    Past Surgical History  Procedure Date  . Coronary stent placement     s/p    Family History  Problem Relation Age of Onset  . Cancer Sister     Uncertain type  . Cancer Brother     Uncertain type    History   Social History  . Marital Status: Single    Spouse Name: N/A    Number of Children: 2  . 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 Smoking status: Current Everyday  Smoker  . Smokeless tobacco: Not on file  . Alcohol Use: Yes     1 beer/day  . Drug Use:   . Sexually Active:    Other Topics Concern  . Not on file   Social History Narrative   Widowed 2003    Review of Systems:  All systems reviewed.  They are negative to the above problem except as previously stated.  Vital Signs: BP 142/64  Pulse 102  Resp 18  Ht 6\' 1"  (1.854 m)  Wt 173 lb 1.9 oz (78.527 kg)  BMI 22.84 kg/m2  Physical Exam  HEENT:  Normocephalic, atraumatic. EOMI, PERRLA.  Neck: JVP is normal. No thyromegaly. No bruits.  Lungs: clear to auscultation. No rales no wheezes.  Heart: Regular rate and rhythm. Normal S1, S2. No S3.   No significant murmurs. PMI not displaced.  Abdomen:  Supple, nontender. Normal bowel sounds. No masses. No hepatomegaly.  Extremities:   Good distal pulses throughout. No lower extremity edema.  Musculoskeletal :moving all extremities.  Neuro:   alert and  oriented x3.  CN II-XII grossly intact.  EKG:  Sinus tachycardia.  102 bpm.  RA abnormality. Assessment and Plan:

## 2011-04-15 NOTE — Telephone Encounter (Signed)
Rx called into Pharmacy 

## 2011-04-15 NOTE — Patient Instructions (Signed)
Fasting lab work at [facility] on 5/7. We will call you with results.  Your physician wants you to follow-up in: 12 months with Dr.Ross  You will receive a reminder letter in the mail two months in advance. If you don't receive a letter, please call our office to schedule the follow-up appointment.

## 2011-04-16 NOTE — Assessment & Plan Note (Signed)
Patient appear to be doing well.  He denies any symptoms to suggest angina.  Follow.  Stay active.

## 2011-04-16 NOTE — Assessment & Plan Note (Signed)
Will set up for fasting lipids. 

## 2011-04-16 NOTE — Assessment & Plan Note (Signed)
Keep on current regimen. 

## 2011-04-18 ENCOUNTER — Ambulatory Visit (INDEPENDENT_AMBULATORY_CARE_PROVIDER_SITE_OTHER)
Admission: RE | Admit: 2011-04-18 | Discharge: 2011-04-18 | Disposition: A | Discharge status: Home / Self Care | Payer: BC Managed Care – PPO | Source: Ambulatory Visit | Attending: Endocrinology | Admitting: Endocrinology

## 2011-04-18 ENCOUNTER — Other Ambulatory Visit (INDEPENDENT_AMBULATORY_CARE_PROVIDER_SITE_OTHER): Payer: BC Managed Care – PPO

## 2011-04-18 ENCOUNTER — Ambulatory Visit (INDEPENDENT_AMBULATORY_CARE_PROVIDER_SITE_OTHER): Payer: BC Managed Care – PPO | Admitting: Endocrinology

## 2011-04-18 ENCOUNTER — Encounter: Payer: Self-pay | Admitting: Endocrinology

## 2011-04-18 VITALS — BP 134/68 | HR 101 | Temp 98.0°F | Ht 73.0 in | Wt 174.0 lb

## 2011-04-18 DIAGNOSIS — I2510 Atherosclerotic heart disease of native coronary artery without angina pectoris: Secondary | ICD-10-CM

## 2011-04-18 DIAGNOSIS — J439 Emphysema, unspecified: Secondary | ICD-10-CM | POA: Insufficient documentation

## 2011-04-18 DIAGNOSIS — J449 Chronic obstructive pulmonary disease, unspecified: Secondary | ICD-10-CM

## 2011-04-18 DIAGNOSIS — E119 Type 2 diabetes mellitus without complications: Secondary | ICD-10-CM

## 2011-04-18 DIAGNOSIS — E7800 Pure hypercholesterolemia, unspecified: Secondary | ICD-10-CM

## 2011-04-18 LAB — BASIC METABOLIC PANEL
BUN: 12 mg/dL (ref 6–23)
Creatinine, Ser: 0.7 mg/dL (ref 0.4–1.5)
GFR: 135.51 mL/min (ref >60.00)
Glucose, Bld: 100 mg/dL — ABNORMAL HIGH (ref 70–99)
Potassium: 4 mEq/L (ref 3.5–5.1)

## 2011-04-18 LAB — LIPID PANEL
LDL Cholesterol: 50 mg/dL (ref 0–99)
VLDL: 9.2 mg/dL (ref 0.0–40.0)

## 2011-04-18 MED ORDER — FLUTICASONE-SALMETEROL 100-50 MCG/DOSE IN AEPB: 1.0000 | INHALATION_SPRAY | Freq: Two times a day (BID) | RESPIRATORY_TRACT | Status: DC

## 2011-04-18 MED ORDER — AZITHROMYCIN 500 MG PO TABS: 500.0000 mg | ORAL_TABLET | Freq: Every day | ORAL | Status: DC

## 2011-04-18 NOTE — Patient Instructions (Addendum)
A chest x-ray is being ordered for you today.  please call [phone] to hear your test results.  You will be prompted to enter the 9-digit "MRN" number that appears at the top left of this page, followed by #.  Then you will hear the message. Take loratadine-d (non-prescription) as needed for congestion.  i have sent a prescription for an antibiotic to your pharmacy. i have also sent a prescription for "advair-100."  take 1 puff 2x a day.  rinse mouth after using. Your regular physical is due in September. Refer to a lung specialist.  you will be called with a day and time for an appointment.  Also, i'll ask a home-health agency to get you set up with oxygen.  you will be called with a day and time for an appointment at your home.   It is critically important to quit smoking.  Let me know if you want a prescription to help. here is a sample of "symbicort-160" (similar to "advair-100").  take 1 puff 2x a day.  rinse mouth after using.

## 2011-04-18 NOTE — Progress Notes (Signed)
  Subjective:    Patient ID: Ryan Contreras, male    DOB: 07/02/1941, 69 y.o.   MRN: [id number]  HPI Pt states 1 week of slight wheezing in the chest, and assoc nasal congestion.  He feels allergy probs contribute to sxs, which include rhinorrhea Past Medical History  Diagnosis Date  . CAD, NATIVE VESSEL 03/15/2010  . CORONARY ARTERY DISEASE 05/14/2008  . Gout, unspecified 01/21/2008  . HYPERLIPIDEMIA 05/14/2008  . HYPERTENSION 05/14/2008  . HYPOKALEMIA 06/03/2009  . Other malaise and fatigue 03/15/2010  . PEPTIC ULCER DISEASE 05/14/2008  . TOBACCO ABUSE 03/15/2010  . AODM 01/21/2008    Past Surgical History  Procedure Date  . Coronary stent placement     s/p    History   Social History  . Marital Status: Single    Spouse Name: N/A    Number of Children: 2  . 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 Smoking status: Current Everyday Smoker  . Smokeless tobacco: Not on file  . Alcohol Use: Yes     1 beer/day  . Drug Use:   . Sexually Active:    Other Topics Concern  . Not on file   Social History Narrative   Widowed 2003    Current Outpatient Prescriptions on File Prior to Visit  Medication Sig Dispense Refill  . allopurinol (ZYLOPRIM) 300 MG tablet Take 300 mg by mouth daily.        Marland Kitchen aspirin 81 MG tablet Take 81 mg by mouth daily.        . clopidogrel (PLAVIX) 75 MG tablet Take 75 mg by mouth daily.        Marland Kitchen glucose blood (ACCU-CHEK COMPACT STRIPS) test strip Once daily, dx 250.00       . losartan-hydrochlorothiazide (HYZAAR) 100-25 MG per tablet Take 1 tablet by mouth daily.        . methocarbamol (ROBAXIN) 500 MG tablet 1 tablet at bedtime as needed for cramps      . potassium chloride (KLOR-CON) 10 MEQ CR tablet Take 10 mEq by mouth daily.        . rosuvastatin (CRESTOR) 40 MG tablet Take 40 mg by mouth daily.        Marland Kitchen VIAGRA 100 MG tablet TAKE AS NEEDED  1 tablet  2    No Known Allergies  Family History    Problem Relation Age of Onset  . Cancer Sister     Uncertain type  . Cancer Brother     Uncertain type    BP 134/68  Pulse 101  Temp(Src) 98 F (36.7 C) (Oral)  Ht 6\' 1"  (1.854 m)  Wt 174 lb (78.926 kg)  BMI 22.96 kg/m2  SpO2 85%    Review of Systems Fever is resolved.  He has right ear congestion, but no otalgia.    Objective:   Physical Exam GENERAL: no distress head: no deformity eyes: no periorbital swelling, no proptosis external nose and ears are normal mouth: no lesion seen Ears:  Tm's are red, left > right.    i reviewed cxr report   Assessment & Plan:  Severe copd, worse Hypoxia, due to copd Smoker.  persistent

## 2011-04-22 ENCOUNTER — Telehealth: Payer: Self-pay

## 2011-04-22 NOTE — Telephone Encounter (Signed)
Left message on machine for pt to return my call  

## 2011-04-22 NOTE — Telephone Encounter (Signed)
Pt called requesting results of Xray and lab results. Results are not on the phone tree system, please advise.

## 2011-04-22 NOTE — Telephone Encounter (Signed)
Pt was advised of result of cxr at ov, after he came back from x-ray downstairs.  Showed emphysema only.  There we no labs that day.

## 2011-04-25 NOTE — Telephone Encounter (Signed)
Pt advised of results , referral

## 2011-04-26 NOTE — Assessment & Plan Note (Signed)
[facility] HEALTHCARE                            CARDIOLOGY OFFICE NOTE   RED, MANDT                       MRN:          [id number]  DATE:09/14/2007                            DOB:          11-18-1941    IDENTIFICATION:  [name] is a 70 year old gentleman with a history  of CAD, hypertension, dyslipidemia.  I last saw him back in February of  this year.   In the interval, he denies chest pain.  He is still smoking about 1/2  pack per day.  Active some.   CURRENT MEDICATIONS:  Include:  1. Plavix 75.  2. Aspirin 81 b.i.d.  3. Allopurinol 300.  4. Crestor 40.  5. Hyzaar?  6. Amlodipine 5.  7. Metformin 500 daily.   Patient out of Advair.  Stopped tramadol.   PHYSICAL EXAMINATION:  Patient is in no distress.  His blood pressure is 159/70.  Did not take his medicines until 1 hour  ago.  Pulse is 78.  Weight 188, which is up 3 pounds from last visit.  LUNGS:  Relatively clear.  CARDIAC:  Regular rate and rhythm.  S1 and S2.  No S3.  ABDOMEN:  Benign.  EXTREMITIES:  No significant edema.   IMPRESSION:  1. Coronary artery disease.  Last cardiac catheterization was back in      2006.  Patient had percutaneous coronary intervention with Cypher      stent to the right coronary artery.  He has been on Plavix now for      over a year.  This is for restenosis and a drug-eluting stent.      Would continue for now.  2. Dyslipidemia.  Last lipid panel LDL was 94, HDL 38.  Continue.  I      will need to check a followup.  3. Hypertension.  Not optimally controlled.  He is unclear about his      medicine.  Question if he is on Hyzaar.  We will need to check with      [facility].  If he stopped, he needs to start.  4. Healthcare maintenance.  Counseled on tobacco.  Counseled on      weight.  We will check an abdominal ultrasound with his vascular      disease to evaluate his aorta.  Otherwise, set followup for later      in the winter.    [name],  MD, [facility]  Electronically Signed   PVR/MedQ  DD: 09/14/2007  DT: 09/15/2007  Job #: [id number]

## 2011-04-26 NOTE — Assessment & Plan Note (Signed)
[facility] HEALTHCARE                            CARDIOLOGY OFFICE NOTE   AYODEJI, KEIMIG                       MRN:          [id number]  DATE:03/31/2008                            DOB:          [birth year]    IDENTIFICATION:  Ryan Contreras is a 70 year old gentleman with CAD,  dyslipidemia, hypertension.  I last saw him back in October.   In the interval, his breathing is about the same.  He continues to smoke  about a half-pack per day.  Denies chest pain, not too active.  He does  work at [employer] and is on his feet throughout the day, not clear how  far he walks.   His current medicines include:  1. Plavix 75.  2. Aspirin 81 b.i.d.  3. Allopurinol 300.  4. Crestor 40.  5. Advair (p.r.n.) 100/50 one puff b.i.d. p.r.n.  6. Amlodipine 5.  7. Metformin 500.  8. Hyzaar 50/12.5.  9. Tramadol APAP 37.5/325 p.r.n.   PHYSICAL EXAMINATION:  The patient is in no distress.  Blood pressure is  142/75, pulse is 90 and regular, weight 186 which is down 2 pounds from  October.  His lungs are relatively clear, some rhonchi.  CARDIAC EXAM:  Regular rate and rhythm, S1-S2.  No murmurs.  No S3.  ABDOMEN:  Is benign.  EXTREMITIES:  No edema.   1. Coronary artery disease.  Last catheterization back in 2006.  The      patient had percutaneous transluminal coronary angioplasty/Cypher      stent to the right coronary artery.  Has been maintained on Plavix      therapy.  This is for restenosis.  I would continue on this.  2. Dyslipidemia.  Last lipid panel in October, LDL was excellent at      58, HDL was 57.  Would continue.  3. Hypertension, fair control.  Would not change.  4. Health care maintenance.  Encouraged him to stop smoking.      Encouraged him to increase his activity.   NOTE:  Abdominal ultrasound was done last fall, showed normal aorta.  Mild atherosclerosis.   I will set to see him back in the winter, sooner if problems develop.     [name], MD, [facility]  Electronically Signed    PVR/MedQ  DD: 03/31/2008  DT: 03/31/2008  Job #: [id number]   cc:   [name], MD

## 2011-04-26 NOTE — Assessment & Plan Note (Signed)
[facility] HEALTHCARE                            CARDIOLOGY OFFICE NOTE   Ryan Contreras, Ryan Contreras                       MRN:          [id number]  DATE:10/27/2008                            DOB:          November 29, 1941    IDENTIFICATION:  [name] is a 70 year old gentleman.  He has a  history of CAD, dyslipidemia, hypertension.  I last saw him in April.   In the interval, he notes some cough, mainly at night.  Denies chest  pain.  Breathing is relatively unchanged.  Note, he continues to smoke.   CURRENT MEDICINES:  1. Plavix 75.  2. Aspirin 81 daily.  3. Allopurinol 300 daily.  4. Crestor 40 daily.  5. Advair p.r.n.  6. Amlodipine 5 daily.  7. Hyzaar 50/12.5.   PHYSICAL EXAMINATION:  GENERAL:  The patient is in no distress.  VITAL SIGNS:  Blood pressure is 140/70, pulse is 83, weight not taken.  LUNGS:  Mild decreased air flow bilaterally.  CARDIAC:  Regular rate and rhythm, S1, S2.  No S3, no murmurs.  ABDOMEN:  Benign.  EXTREMITIES:  No edema.   A 12-lead EKG normal sinus rhythm 83 beats per minute.   IMPRESSION:  1. Coronary artery disease.  Last catheterization in 2006.  He      underwent percutaneous transluminal coronary angioplasty/Cypher      stent to the right coronary artery.  This had in-stent restenosis.      I would keep him on the Plavix and aspirin.  Continue other      medicines.  2. Dyslipidemia.  Last lipid panel was back in October.  Excellent      control with an LDL of 58, HDL of 57 that was on Crestor 40.  He      could back down to Crestor 20 and followup a LipoMed in 8 weeks'      time.  3. Hypertension.  Blood pressure is in the 140.  It had been 150.  I      would increase his Norvasc to 10.  4. Health care maintenance.  Counseled on smoking cessation.  I will      also encourage him to continue walking.     [name], MD, [facility]  Electronically Signed    PVR/MedQ  DD: 10/27/2008  DT: 10/28/2008  Job #: [id number]   cc:   Ryan Signs A. [name], MD

## 2011-04-28 ENCOUNTER — Encounter: Payer: Self-pay | Admitting: Pulmonary Disease

## 2011-04-29 NOTE — Assessment & Plan Note (Signed)
[facility] HEALTHCARE                              CARDIOLOGY OFFICE NOTE   Ryan, Contreras                       MRN:          [id number]  DATE:06/26/2006                            DOB:          December 05, 1941    IDENTIFICATION:  [name] is a 70 year old gentleman with a history of  CAD, hypertension, dyslipidemia.  I last saw him in June.  When I saw him I  went up on his antihypertensives.   In the interval he has done okay.  He denies chest pain.   CURRENT MEDICATIONS:  1.  Plavix 75 daily.  2.  Aspirin 81 b.i.d.  3.  Allopurinol 300 daily.  4.  Crestor 40 daily.  5.  Advair p.r.n.  6.  Tramadol q.4h. p.r.n.  7.  Hyzaar 100/25 daily.   PHYSICAL EXAMINATION:  GENERAL:  Patient is in no distress.  VITAL SIGNS:  Blood pressure is 146/78 on arrival, 120/82 on my check.  Pulse 82, weight 177.  LUNGS:  Clear.  CARDIAC:  Regular rate rhythm, S1, S2.  EXTREMITIES:  No edema.   IMPRESSION:  1.  Hypertension, better control.  Would continue on current regimen.  Check      BMET today.  2.  Coronary artery disease clinically stable.  I would continue on Plavix.      Again, he had restenosis and a drug-eluting stent.  Would continue      indefinitely.  3.  Dyslipidemia.  Adequate lipid control back in June.  Would continue on      current regimen.   I will set to see him back in January, sooner if problems develop.   ADDENDUM:  Note, patient had recent colonoscopy done.  Had polyp sent for  pathology.  I do not have those results available.  Follow up in GI.                                [name], MD, [facility]    PVR/MedQ  DD:  06/26/2006  DT:  06/26/2006  Job #:  [id number]

## 2011-04-29 NOTE — Assessment & Plan Note (Signed)
[facility] HEALTHCARE                            CARDIOLOGY OFFICE NOTE   KANAAN, KAGAWA                       MRN:          [id number]  DATE:02/09/2007                            DOB:          July 16, 1941    IDENTIFICATION:  [name] is a 70 year old gentleman with a history  of CAD, hypertension, dyslipidemia.  Last seen in July.   In the interval, he has done okay.  He is still smoking, though, about a  pack a day.  He has had some cough.  Does not take his Advair all the  time.  Denies chest pain.  Breathing is about the same.   CURRENT MEDICATIONS:  1. Plavix 75 daily.  2. Aspirin 81 mg b.i.d.  3. Allopurinol 300 daily.  4. Crestor 40 daily.  5. Advair 100/50 b.i.d. p.r.n.  6. Tramadol q.4 p.r.n.  7. Hyzaar 100/25 daily.  8. Amlodipine 5 daily.  9. Metformin 500 daily.   PHYSICAL EXAM:  The patient is in no distress.  Blood pressure is 140/73, pulse is 86, weight 185, up from 177 in July.  LUNGS:  Clear, but decreased air flow.  Occasional wheeze on the right.  CARDIAC:  Regular rate and rhythm.  S1, S2.  No S3.  No significant  murmurs.  EXTREMITIES:  No edema.   IMPRESSION:  1. Coronary artery disease, clinically stable.  Would continue on      current regimen.  Again, he has had restenosis and a drug-eluting      stent.  Needs Plavix indefinitely.  2. Hypertension, adequate control.  Would follow.  3. Dyslipidemia.  Excellent control by lipid panel.  Would continue on      current regimen.  4. Health care maintenance.  Counseled him on smoking.  Told him to      try to use the Advair on a regular basis to see if his cough      improves.  Encouraged him to walk.   Otherwise, I will set to see him back in the fall, sooner if problems  develop.     [name], MD, [facility]  Electronically Signed    PVR/MedQ  DD: 02/09/2007  DT: 02/09/2007  Job #: [id number]

## 2011-04-29 NOTE — Cardiovascular Report (Signed)
NAME:  Ryan Contreras, Ryan Contreras                ACCOUNT NO.:  [id number]   MEDICAL RECORD NO.:  [id number]          PATIENT TYPE:  OIB   LOCATION:  2855                         FACILITY:  [facility]   PHYSICIAN:  Carole Binning, M.D. LHCDATE OF BIRTH:  03/17/1941   DATE OF PROCEDURE:  DATE OF DISCHARGE:                              CARDIAC CATHETERIZATION   PROCEDURE PERFORMED:  1.  Left heart catheterization with coronary angiography and left      ventriculography.  2.  Percutaneous coronary intervention with placement of a drug-eluting      stent in the proximal right coronary artery.   INDICATIONS:  Ryan Contreras is a 70 year old male with a history of coronary  disease.  In 2004, he underwent placement of a bare metal stent in the  proximal right coronary by Dr. Juanda Chance.  This was a 4.0 x 24-mm stent.  The  patient has been followed in the office by Dr. [name].  He has been  noted to have more frequent ventricular ectopy.  He has had occasional  shortness of breath but no chest pain.  A stress nuclear study revealed  significant inferior wall ischemia.  He is thus referred for cardiac  catheterization.   CATHETERIZATION PROCEDURAL NOTE:  A 6-French sheath was placed in the right  femoral artery.  Coronary angiography was performed with standard Judkins 6-  French catheters.  Left ventriculography was performed with an angled  pigtail catheter.  Contrast was Omnipaque. There were no complications.   RESULTS:   HEMODYNAMICS:  1.  Left ventricular pressure 130/14.  2.  Aortic pressure 130/80. There was no aortic valve gradient.   LEFT VENTRICULOGRAM:  1.  Wall motion is normal.  2.  Ejection fraction is estimated at 55%.  3.  There is trace mitral regurgitation.   CORONARY ARTERIOGRAPHY:  1.  Left main is normal.  2.  Left anterior descending artery has a 60-70% stenosis in the proximal      vessel at the origin of the small first diagonal branch.  The LAD is      otherwise normal  giving rise to a small first diagonal branch, a normal      size second diagonal branch.  3.  Left circumflex has a 30% stenosis in the proximal vessel and a tubular      60% stenosis in the mid vessel extending into the proximal portion of      the first obtuse marginal branch.  The circumflex gives rise to a small      ramus intermedius, a very large first obtuse marginal, and a small      second obtuse marginal branch.  The first obtuse marginal has a 60%      stenosis at its origin as described above.  4.  Right coronary artery is a dominant vessel.  There is a stent in      proximal vessel.  Within the stent there is a 75% in-stent restenosis.      Beyond the stent in the mid vessel, there is a diffuse 30% stenosis.  The distal right coronary artery gives rise to normal size posterior      descending artery, a normal size first posterolateral branch, and a      small second posterolateral branch.   IMPRESSION:  1.  Left ventricular systolic function in the low range of normal.  2.  Three-vessel coronary artery disease characterized by significant in-      stent restenosis in the proximal right coronary artery.  There is      moderate disease in the left anterior descending artery and left      circumflex coronary artery which is not significantly changed compared      to a previous catheterization.  In addition, stress nuclear scan does      document a significant inferior wall ischemia.   PLAN:  Percutaneous intervention of the right coronary artery.   PERCUTANEOUS TRANSLUMINAL CORONARY ANGIOPLASTY PROCEDURAL NOTE:  We utilized  preexisting 6-French sheath in the right femoral artery.  Angiomax was  administered per protocol.  We used a 6-French JR-4 guiding catheter.  An  Asahi soft coronary guidewire was advanced under fluoroscopic guidance into  the distal right coronary artery.  We then positioned a 3.5 x 23-mm Cypher  drug-eluting stent across across the restenotic  segment within the  previously placed stent.  The stent was deployed at 18 atmospheres.  We then  went in with a 4.0 x 20-mm Quantum balloon and inflated this to 14  atmosphere in the distal aspect of stent and 16 atmospheres  in the proximal  aspect of stent.  Intracoronary nitroglycerin was administered.  Final  angiographic images were obtained revealing patency of the right coronary  artery with 0% residual stenosis at the stent site and TIMI III flow into  the distal vessel.   COMPLICATIONS:  None.   RESULTS:  Successful placement of a drug-eluting stent in the proximal right  coronary, a tubular 75% in-stent restenosis was reduced to 0% residual TIMI  III flow.   PLAN:  1.  Angiomax will be discontinued.  2.  Plavix is recommended.  The patient will be treated with Plavix for 1      year.      MWP/MEDQ  D:  01/11/2005  T:  01/11/2005  Job:  [id number]   cc:   [name], M.D.   Sean A. [name], M.D. [facility]   Cardiac Cath Lab

## 2011-04-29 NOTE — Discharge Summary (Signed)
NAME:  Ryan Contreras, Ryan Contreras                          ACCOUNT NO.:  [id number]   MEDICAL RECORD NO.:  [id number]                   PATIENT TYPE:  OIB   LOCATION:  6532                                 FACILITY:  [facility]   PHYSICIAN:  [name], M.D.                  DATE OF BIRTH:  1941-06-05   DATE OF ADMISSION:  09/26/2003  DATE OF DISCHARGE:  09/27/2003                           DISCHARGE SUMMARY - REFERRING   PROCEDURES:  1. Cardiac catheterization.  2. Coronary arteriogram.  3. Left ventriculogram.  4. Closure by Angio-Seal.  5. Percutaneous transluminal coronary angioplasty and Express stent to one     vessel.   HOSPITAL COURSE:  [name] is a 70 year old male with no known history  of coronary artery disease.  He was seen by Dr. [name] and because  of cardiac risk factors and EKG changes, he had a stress test.  The  Cardiolite images showed reversibility in the inferior area with an EF of  40%, so he was scheduled for catheterization.   [name] came in for catheterization on September 26, 2003 and the cardiac  catheterization showed a normal left main, an LAD with a 70% hypodense  lesion in the branch of the OM with a 50% stenosis.  The RCA had an 80%  stenosis and this was treated with an Express stent, reducing the stenosis  to less than 10%.  His EF was 55% with inferior hypokinesis.  He was closed  with Angio-Seal.   His post-procedure CK-MB is 369/11.0.  This was drawn on September 26, 2003  and it will be rechecked on September 27, 2003.  If this is trending down, he  will be considered stable and ready for discharge.  Additionally, he had  some problems with hypoglycemia, where his CBG dropped to 77 and he had an  acute episode of diaphoresis.  This resolved with orange juice.  His CBG  this next morning was 112.  The patient is encouraged to eat regular meals  and stick to a low-fat diet.  He has continued on his home medications and  any medication  changes for better blood pressure control with a blood  pressure of 150/56 and a rate generally greater than 80 will be per M.D.  Additionally, he is a smoker and has an expiratory wheeze, so he was started  on Wellbutrin and a Combivent inhaler will be considered.  Mr. Morren will  follow up with his primary care physician and with cardiology at discharge.   LABORATORY VALUES:  Hemoglobin 14.2, hematocrit 41.7, WBC 11.8, platelets  279,000.  Sodium 138, potassium 3.6, chloride 100, CO2 32, BUN 13,  creatinine 0.8, glucose 112.  CK/MB 369/11.0.   DISCHARGE CONDITION:  Improved.   DISCHARGE DIAGNOSES:  1. Coronary artery disease, status post percutaneous transluminal coronary     angioplasty and Express stent to the right coronary artery, this  admission.  2. Residual coronary artery disease in the left anterior descending at 70%.  3. Preserved left ventricular function with an ejection fraction of 55%, but     inferior hypokinesis.  4. Hypertension.  5. Dyslipidemia.  6. Tobacco use.  7. Family history of premature coronary artery disease.   DISCHARGE INSTRUCTIONS:  1. His activity level is to include no driving for two days and no strenuous     activity for a week.  2. He is to stick to a low-fat diet.  3. He is to call the office for problems with the catheterization site.  4. He is to follow up with Dr. [name] and with Dr. Sherol Dade. [facility] in     two weeks.   DISCHARGE MEDICATIONS:  1. Aspirin 325 mg daily.  2. Plavix 75 mg daily.  3. Lovastatin 80 mg daily.  4. Nitroglycerin as needed (p.r.n.).  5. Maxzide 37.5/25 mg daily.  6. Tylenol p.r.n.  7. Wellbutrin 150 mg daily for three days, then one tab b.i.d.      Lavella Hammock, P.A. [facility]                  [name], M.D.    RG/MEDQ  D:  09/27/2003  T:  09/27/2003  Job:  [id number]   cc:   [name], M.D.   Sean A. [name], M.D. [facility]

## 2011-04-29 NOTE — Discharge Summary (Signed)
Ryan Contreras, Ryan Contreras                ACCOUNT NO.:  [id number]   MEDICAL RECORD NO.:  [id number]          PATIENT TYPE:  OIB   LOCATION:  6525                         FACILITY:  [facility]   PHYSICIAN:  Carole Binning, M.D. LHCDATE OF BIRTH:  Aug 02, 1941   DATE OF ADMISSION:  01/11/2005  DATE OF DISCHARGE:  01/12/2005                                 DISCHARGE SUMMARY   PROCEDURE:  1.  Cardiac catheterization.  2.  Coronary arteriogram.  3.  Left ventriculogram.  4.  Percutaneous transluminal coronary angiography and CYPHER stent x1.   HISTORY OF PRESENT ILLNESS:  The patient is a 70 year old male with known  coronary artery disease.  He was seen in the office and was having  occasional shortness of breath and increased cardiac ectopy.  A Cardiolite  showed inferior ischemia and an ejection fraction of 47%.  These changes  were similar to the changes he had prior to the percutaneous intervention to  the RCA in 2004.  The patient was evaluated by Dr. [name], who felt  that outpatient cardiac catheterization and possible percutaneous  intervention was indicated.  The patient was admitted for this on January 11, 2005.   HOSPITAL COURSE:  The cardiac catheterization showed a 70% LAD, a 60% ramus,  60% circumflex and 75% in-stent restenosis in the RCA.  Dr. Emilie Rutter.  Pulsipher inserted a CYPHER stent, and reduced the stenosis from 75% to 0%,  with TIMI-3 flow.  There were no complications.  He was loaded on Plavix in  the catheterization laboratory and is to continue on it for one year.   Post-procedure his electrocardiogram is without ischemic changes.  He had a  peri-procedure myocardial infarction with his last intervention, and the  cardiac enzymes are pending.  If he does well overnight and has no elevation  in his cardiac enzymes, he is tentatively considered stable for discharge on  January 12, 2005, without outpatient followup arranged.   DISCHARGE DIAGNOSES:  1.  Dyspnea  on exertion, probable anginal equivalent.  2.  Status post cardiac catheterization this admission, with a percutaneous      transluminal coronary angiography and a CYPHER stent to the right      coronary artery, for in-stent restenosis.  3.  Residual coronary artery disease of 60%-70% in the circumflex and the      left anterior descending coronary artery, with medical therapy      recommended.  4.  Hypertension.  5.  Dyslipidemia.  6.  History of erectile dysfunction.  7.  Depression.  8.  Family history of premature coronary artery disease.   DISCHARGE INSTRUCTIONS:  1.  His activity level is to include no driving for two days, and no      strenuous activity for one week.  2.  He is to call the office for problems with the catheterization site.  3.  He is to stick to a low-fat diet.  4.  He is to follow up with Dr. [name] on January 27, 2005, at 3      p.m.  5.  He is to follow up with his primary care physician as needed.   DISCHARGE MEDICATIONS:  1.  Coated aspirin 325 mg daily.  2.  Plavix 75 mg daily.  3.  Toprol XL 25 mg daily.  4.  Crestor, as prior to admission.  5.  Triamterene/HCTZ 37.5/12.5 mg daily.  6.  Nitroglycerin sublingual p.r.n.      RB/MEDQ  D:  01/11/2005  T:  01/11/2005  Job:  [id number]   cc:   [name], M.D. [facility]   [name], M.D.

## 2011-04-29 NOTE — Discharge Summary (Signed)
NAME:  Ryan Contreras, Ryan Contreras                          ACCOUNT NO.:  [id number]   MEDICAL RECORD NO.:  [id number]                   PATIENT TYPE:  OIB   LOCATION:  4730                                 FACILITY:  [facility]   PHYSICIAN:  Jeremaine Maraj Dicator                     DATE OF BIRTH:  09/05/1941   DATE OF ADMISSION:  09/26/2003  DATE OF DISCHARGE:  09/28/2003                           DISCHARGE SUMMARY - REFERRING   PRIMARY CAREGIVER:  Sean A. [name], M.D.   DISCHARGE DIAGNOSES:  1. Abnormal dobutamine Cardiolite study on August 08, 2003, showing moderate     reversibility inferiorly.  Ejection fraction estimated at 40%.  Left     ventricle mildly dilated.  2. Coronary artery disease found at left heart catheterization on September 26, 2003.  This study showed 80% right coronary artery stenosis, 70%     proximal left anterior descending stenosis after the septal perforator,     and a 50% stenosis at the distal left circumflex.  Ejection fraction 55%.  3. Elevated enzymes post catheterization day #1, trending down during the     day.  Would desire early followup in the office with Dr. Tenny Craw.   SECONDARY DIAGNOSES:  1. Hypertension.  2. Dyslipidemia.  3. Current long-term history of tobacco habituation.   DISCHARGE DISPOSITION:  Mr. Digilio is ready for discharge on September 28, 2003, postprocedure day #2.  The catheterization site at the right groin had  no swelling and no bruit.  The right lower extremity is well perfused.  The  patient has had no chest pain during this hospitalization.  He is achieving  98% oxygen saturations on room air.  Cardiac enzymes were obtained  postprocedure on September 26, 2003, at 2258 hours, CK 369 and CK-MB 11.  On  September 27, 2003, at 0845 hours, CK 435 and CK-MB 18.2.  On September 27, 2003, at 1510 hours, CK 409 and CK-MB 15.7.  The patient has maintained  sinus rhythm this hospitalization.  Mental status has been clear.  He has  been afebrile.  He is  go home on September 28, 2003, on the following  medications.   DISCHARGE MEDICATIONS:  1. Enteric-coated aspirin 325 mg daily.  2. Plavix 75 mg daily.  3. Mevacor 80 mg daily at bedtime.  4. Nitroglycerin 0.4 mg one tablet sublingual every five minutes x 3 as     needed.  5. Maxzide 37.5/25 mg one tablet daily.  6. Tylenol 325 mg one to two tablets every four to six hours as needed for     pain.  7. Wellbutrin 150 mg daily for three days and then to increase to two     tablets daily.   DISCHARGE ACTIVITY:  No driving for the next two days.  No strenuous  activity for one week.   DISCHARGE DIET:  Low-sodium, low-cholesterol diet.  WOUND CARE:  If he has any swelling or increased pain at the catheterization  site, he is to call [phone].   FOLLOWUP:  He will have an office visit followup with Dr. Tenny Craw in one week.  Dr. Charlott Rakes office will call with that appointment.   BRIEF HISTORY:  The patient is a 70 year old gentleman referred to the  cardiology clinic for evaluation of abnormal Cardiolite study.  This study  was ordered by his primary caregiver.  The patient has a prior history of  hypertension, dyslipidemia, and tobacco use.  The patient denies any recent  history of chest pain.  He has no significant shortness of breath and has  noted no fatigue, increased dyspnea, or chest pain with exertion.  The  patient was seen by Dr. [name].  Because of his risk factors and a change  in his electrocardiogram, which showed T-wave inversion in the high lateral  lead, he was set up for a dobutamine Cardiolite study.  The perfusion study  showed that the left ventricle was mildly dilated and mildly to moderately  decreased uptake in the inferior wall.  There appeared to be moderate  reversibility inferiorly.  Ejection fraction estimated at 40%.   PROCEDURES:  On September 26, 2003, left heart catheterization by way of the  right femoral artery.  This study showed a proximal 80% stenosis  of the  right coronary artery which was reduced to 10% after placement of a 4.8 x 24  Express 2 stent.  He also has residual disease, a 70% stenosis after the  first septal perforator of the left anterior descending, a 50% distal left  circumflex stenosis, and ejection fraction 55%.   HOSPITAL COURSE:  The patient was admitted to [facility] on September 26, 2003, for a left heart catheterization.  This study is as above.  An  Express stent was placed in the right coronary artery.  The patient  tolerated the procedure well.  He had mild enzyme elevations after the  procedure and was followed for one day.  On September 27, 2003, these were  trending down.  The patient has remained chest pain-free during this  hospitalization.  He goes home with the medications and followup as dictated  above.      Maple Mirza, P.A.                    Keshauna Degraffenreid Dicator    GM/MEDQ  D:  09/28/2003  T:  09/28/2003  Job:  [id number]   cc:   [name], M.D.   Sean A. [name], M.D. [facility]

## 2011-04-29 NOTE — Cardiovascular Report (Signed)
NAME:  Ryan Contreras, Ryan Contreras                          ACCOUNT NO.:  [id number]   MEDICAL RECORD NO.:  [id number]                   PATIENT TYPE:  OIB   LOCATION:  6532                                 FACILITY:  [facility]   PHYSICIAN:  [name], M.D.                  DATE OF BIRTH:  09-Sep-1941   DATE OF PROCEDURE:  09/26/2003  DATE OF DISCHARGE:                              CARDIAC CATHETERIZATION   CLINICAL HISTORY:  [name] is 70 years old and has positive risk  factors for coronary disease and continues to smoke, but has had no  documented coronary disease.  Dr. [name] performed a screening Cardiolite  scan and it showed an ejection fraction of 40% and inferior scar with  superimposed ischemia.  He was seen in consultation by [name] who  scheduled him for evaluation angiography.   PROCEDURE:  The procedure was performed via the right femoral artery using  arterial sheath and 6 French preformed coronary catheter.  A front wall  arterial puncture was performed and Omnipaque contrast was used.  After  completion of the diagnostic study, made decision to proceed with  intervention on the lesion in the proximal right coronary artery.   The patient was given Angiomax bolus and infusion and was given 300 mg of  Plavix.  We used a JR-4 6 [place] guiding cathter with side holes and a short  Luge wire.  We crossed the lesion with the wire without difficulty.  We  direct stented with a 4.0 x 24-mm Express stent deploying this with one  inflation of 18 atmospheres for 30 seconds.  Repeat diagnostics were then  performed through the guiding catheter.  The patient tolerated the procedure  well and the laboratory in satisfactory condition.   RESULTS:  Left main coronary artery:  The left main coronary was free of  significant disease.   Left anterior descending artery:  Left anterior descending artery gave rise  to a septal perforator and a diagonal branch.  Between the septal perforator  and diagonal branch there was hypodense area that appeared to be narrowed  about 70%.   Circumflex artery:  The circumflex artery gave rise to an atrial branch, AV  branch, and a marginal branch.  The distal portion of the marginal branch  had a 50% narrowing.   Right coronary artery:  The right coronary artery was a large vessel that  gave rise to a right ventricular branch, posterior descending branch, and  three posterior lateral branches.  There was 80% narrowing in the proximal  right coronary artery located between two moderate angled bends.   LEFT VENTRICULOGRAM:  The left ventriculogram performed in the RAO  projection showed hypokinesis of the mid inferior wall.  The overall wall  motion was good with an estimated ejection fraction of 65%.   Following stenting of the lesion in the proximal right coronary artery the  stenosis improved from 80% to 10%.  There was dissection seen.   CONCLUSIONS:  1. Coronary artery disease with 70% stenosis in the proximal LAD, 50%     stenosis in the distal marginal branch of     the circumflex artery and 80% narrowing in the proximal right coronary     artery with normal LV function.  2. Successful stenting of the lesion in the proximal right coronary artery     with improvement in stent narrowing from 80% to 10% using a bare metal     stent.                                                 [name], M.D.    BB/MEDQ  D:  09/26/2003  T:  09/27/2003  Job:  [id number]   cc:   [name], M.D. [facility]   [name], M.D.

## 2011-05-04 ENCOUNTER — Institutional Professional Consult (permissible substitution): Payer: Medicare Other | Admitting: Pulmonary Disease

## 2011-05-05 ENCOUNTER — Encounter: Payer: Self-pay | Admitting: Pulmonary Disease

## 2011-05-05 ENCOUNTER — Ambulatory Visit (INDEPENDENT_AMBULATORY_CARE_PROVIDER_SITE_OTHER): Payer: Medicare Other | Admitting: Pulmonary Disease

## 2011-05-05 VITALS — BP 148/66 | HR 80 | Temp 98.3°F | Ht 73.0 in | Wt 177.6 lb

## 2011-05-05 DIAGNOSIS — J449 Chronic obstructive pulmonary disease, unspecified: Secondary | ICD-10-CM

## 2011-05-05 NOTE — Progress Notes (Signed)
  Subjective:    Patient ID: Ryan Contreras, male    DOB: [birth year], 70 y.o.   MRN: [id number]  HPI The pt is a 70y/o male who I have been asked to see for possible copd.  He has a long history of smoking, and continues to do so.  He is a very poor historian, and it is difficult to get a dyspnea history from him.  He is unsure how much sob he really has.  He gets winded with walking a flight of stairs, but will not with taking trash cans to street or bringing groceries in from the car.  He has mild chronic cough with only occasional discolored mucus.  He denies chest congestion.  His most recent cxr shows hyperinflation, and he has not had recent pfts.  He currently is on symbicort, but is unsure how much it has actually helped.  He denies LE edema. He also has oxygen that he rarely wears.   Review of Systems  Constitutional: Negative for fever and unexpected weight change.  HENT: Positive for dental problem. Negative for ear pain, nosebleeds, congestion, sore throat, rhinorrhea, sneezing, trouble swallowing, postnasal drip and sinus pressure.   Eyes: Negative for redness and itching.  Respiratory: Positive for cough and shortness of breath. Negative for chest tightness and wheezing.   Cardiovascular: Negative for palpitations and leg swelling.  Gastrointestinal: Negative for nausea and vomiting.  Genitourinary: Negative for dysuria.  Musculoskeletal: Negative for joint swelling.  Skin: Negative for rash.  Neurological: Negative for headaches.  Hematological: Does not bruise/bleed easily.  Psychiatric/Behavioral: Negative for dysphoric mood. The patient is not nervous/anxious.        Objective:   Physical Exam Constitutional:  Well developed, no acute distress  HENT:  Nares patent without discharge  Oropharynx without exudate, palate and uvula are elongated  Eyes:  Perrla, eomi, no scleral icterus  Neck:  No JVD, no TMG  Cardiovascular:  Normal rate, regular rhythm, no rubs or  gallops.  No murmurs        Intact distal pulses  Pulmonary :  decreased breath sounds, no stridor or respiratory distress   No rales, rhonchi, or wheezing  Abdominal:  Soft, nondistended, bowel sounds present.  No tenderness noted.   Musculoskeletal:  Mild lower extremity edema noted.  Lymph Nodes:  No cervical lymphadenopathy noted  Skin:  No cyanosis noted  Neurologic:  Alert, appropriate, moves all 4 extremities without obvious deficit.         Assessment & Plan:

## 2011-05-05 NOTE — Assessment & Plan Note (Signed)
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  At this point, I have asked him to stay on symbicort and to try and completely stop smoking.  Will schedule for full pfts, and see him back the same day for review.

## 2011-05-05 NOTE — Patient Instructions (Signed)
Stay on symbicort 160/4.5  2 inhalations am and pm.  Rinse mouth well. Will schedule for breathing studies, and see you back same day. You need to stop smoking.  followup with me same day as breathing studies.

## 2011-05-18 ENCOUNTER — Telehealth: Payer: Self-pay | Admitting: Internal Medicine

## 2011-05-18 MED ORDER — LOSARTAN POTASSIUM-HCTZ 100-25 MG PO TABS: 1.0000 | ORAL_TABLET | Freq: Every day | ORAL | Status: DC

## 2011-05-18 NOTE — Telephone Encounter (Signed)
Pt needs his b/p meds called in and he doesn't know what the name of it is and he is completely out and Pharm has already given him some and they wont give anymore and he needs [facility] [place] [phone]

## 2011-05-20 ENCOUNTER — Ambulatory Visit (INDEPENDENT_AMBULATORY_CARE_PROVIDER_SITE_OTHER): Payer: Medicare Other | Admitting: Pulmonary Disease

## 2011-05-20 ENCOUNTER — Encounter: Payer: Self-pay | Admitting: Pulmonary Disease

## 2011-05-20 VITALS — BP 122/66 | HR 94 | Temp 97.9°F | Ht 73.0 in | Wt 180.0 lb

## 2011-05-20 DIAGNOSIS — J449 Chronic obstructive pulmonary disease, unspecified: Secondary | ICD-10-CM

## 2011-05-20 LAB — PULMONARY FUNCTION TEST

## 2011-05-20 MED ORDER — TIOTROPIUM BROMIDE MONOHYDRATE 18 MCG IN CAPS: 18.0000 ug | ORAL_CAPSULE | Freq: Every day | RESPIRATORY_TRACT | Status: DC

## 2011-05-20 NOTE — Progress Notes (Signed)
PFT done today. 

## 2011-05-20 NOTE — Assessment & Plan Note (Signed)
The pt has severe airflow obstruction that I suspect is due to emphysema.  He is still smoking, and I have told him the first line therapy is smoking cessation.  He has done better with the symbicort, but would also like to try adding spiriva to his regimen to see if his symptoms improve.

## 2011-05-20 NOTE — Patient Instructions (Signed)
Stay on symbicort inhaler as you are doing.  2 inhalations am and pm.  Keep mouth rinsed. Will try you on spiriva one inhalation each am.  If this helps you, go ahead and fill prescription and stay on medication. You must stop smoking!!  This is the most important thing you can do. followup with me in 4mos.

## 2011-05-20 NOTE — Progress Notes (Signed)
  Subjective:    Patient ID: Ryan Contreras, male    DOB: 07/15/1941, 70 y.o.   MRN: [id number]  HPI The pt comes in today for f/u of his recent pfts.  He was found to have severe airflow obstruction, no restriction.  I have reviewed the study with him in detail, and answered all of his questions.  He is continuing to smoke, but is trying to cut back.    Review of Systems  Constitutional: Negative for fever and unexpected weight change.  HENT: Positive for dental problem. Negative for ear pain, nosebleeds, congestion, sore throat, rhinorrhea, sneezing, trouble swallowing, postnasal drip and sinus pressure.   Eyes: Negative for redness and itching.  Respiratory: Positive for cough, shortness of breath and wheezing. Negative for chest tightness.   Cardiovascular: Negative for palpitations and leg swelling.  Gastrointestinal: Negative for nausea and vomiting.  Genitourinary: Negative for dysuria.  Musculoskeletal: Negative for joint swelling.  Skin: Negative for rash.  Neurological: Negative for headaches.  Hematological: Does not bruise/bleed easily.  Psychiatric/Behavioral: Negative for dysphoric mood. The patient is not nervous/anxious.        Objective:   Physical Exam Wd male in nad.   Chest with decreased bs, no wheezing Cor with rrr LE with mild edema, no cyanosis  Alert and oriented, moves all 4        Assessment & Plan:

## 2011-06-02 ENCOUNTER — Other Ambulatory Visit: Payer: Self-pay | Admitting: Endocrinology

## 2011-06-09 ENCOUNTER — Encounter: Payer: Self-pay | Admitting: Pulmonary Disease

## 2011-07-16 ENCOUNTER — Encounter: Payer: Self-pay | Admitting: Internal Medicine

## 2011-07-20 ENCOUNTER — Other Ambulatory Visit: Payer: Self-pay | Admitting: *Deleted

## 2011-07-20 MED ORDER — ALLOPURINOL 300 MG PO TABS: 300.0000 mg | ORAL_TABLET | Freq: Every day | ORAL | Status: DC

## 2011-07-20 NOTE — Telephone Encounter (Signed)
R'cd fax from [facility] for refill of Allopurinol  Last OV-04/18/2011  Last filled-06/02/2011

## 2011-08-05 ENCOUNTER — Other Ambulatory Visit: Payer: Self-pay | Admitting: Internal Medicine

## 2011-08-26 ENCOUNTER — Other Ambulatory Visit: Payer: Self-pay | Admitting: Endocrinology

## 2011-09-19 ENCOUNTER — Ambulatory Visit: Payer: Medicare Other | Admitting: Pulmonary Disease

## 2011-10-07 ENCOUNTER — Other Ambulatory Visit: Payer: Self-pay | Admitting: Endocrinology

## 2011-10-12 ENCOUNTER — Ambulatory Visit (INDEPENDENT_AMBULATORY_CARE_PROVIDER_SITE_OTHER): Payer: BC Managed Care – PPO | Admitting: Pulmonary Disease

## 2011-10-12 ENCOUNTER — Encounter: Payer: Self-pay | Admitting: Pulmonary Disease

## 2011-10-12 VITALS — BP 110/72 | HR 92 | Temp 98.2°F | Ht 73.0 in | Wt 189.6 lb

## 2011-10-12 DIAGNOSIS — J449 Chronic obstructive pulmonary disease, unspecified: Secondary | ICD-10-CM

## 2011-10-12 NOTE — Progress Notes (Signed)
  Subjective:    Patient ID: Ryan Contreras, male    DOB: 1941/06/22, 70 y.o.   MRN: [id number]  HPI The patient comes in today for followup of his known emphysema.  Spiriva was added to his regimen at the last visit, and the patient feels this has helped his breathing to some degree.  He still has significant dyspnea on exertion at times, but unfortunately continues to smoke.  He has tried to cut back on his quantity of smoking.  He has a mild cough with clear mucus production, but no chest congestion.   Review of Systems  Constitutional: Negative for fever and unexpected weight change.  HENT: Negative for ear pain, nosebleeds, congestion, sore throat, rhinorrhea, sneezing, trouble swallowing, dental problem, postnasal drip and sinus pressure.   Eyes: Negative for redness and itching.  Respiratory: Positive for cough, shortness of breath and wheezing. Negative for chest tightness.   Cardiovascular: Negative for palpitations and leg swelling.  Gastrointestinal: Negative for nausea and vomiting.  Genitourinary: Negative for dysuria.  Musculoskeletal: Negative for joint swelling.  Skin: Negative for rash.  Neurological: Negative for headaches.  Hematological: Does not bruise/bleed easily.  Psychiatric/Behavioral: Negative for dysphoric mood. The patient is not nervous/anxious.        Objective:   Physical Exam Overweight male in no acute distress Nose without purulence or discharge noted Chest with decreased breath sounds, no wheezes or rhonchi Cardiac exam with regular rate and rhythm Lower extremities without edema, no cyanosis noted Alert and oriented, moves all 4 extremities.       Assessment & Plan:

## 2011-10-12 NOTE — Patient Instructions (Signed)
Will give you the flu shot today Stay on symbicort and spiriva Really work on quitting smoking.  This is the key for your breathing to improve followup with me in 6mos, but call if your breathing worsens.

## 2011-10-12 NOTE — Assessment & Plan Note (Signed)
The patient has seen some improvement in his breathing with intensification of his bronchodilator regimen.  However, I have explained to him that smoking cessation is the key component to further improvement.  However like him to continue on his current pulmonary medications, and to work on some type of conditioning program.  Will give the patient a flu shot today, and he is to follow up with me in 6 months.

## 2011-10-21 ENCOUNTER — Other Ambulatory Visit: Payer: Self-pay | Admitting: Internal Medicine

## 2011-12-07 ENCOUNTER — Ambulatory Visit: Payer: BC Managed Care – PPO | Admitting: Internal Medicine

## 2011-12-07 ENCOUNTER — Ambulatory Visit (INDEPENDENT_AMBULATORY_CARE_PROVIDER_SITE_OTHER): Payer: BC Managed Care – PPO | Admitting: Internal Medicine

## 2011-12-07 ENCOUNTER — Telehealth: Payer: Self-pay | Admitting: Pulmonary Disease

## 2011-12-07 ENCOUNTER — Encounter: Payer: Self-pay | Admitting: Internal Medicine

## 2011-12-07 VITALS — BP 120/82 | HR 97 | Temp 98.9°F

## 2011-12-07 DIAGNOSIS — J441 Chronic obstructive pulmonary disease with (acute) exacerbation: Secondary | ICD-10-CM

## 2011-12-07 DIAGNOSIS — J069 Acute upper respiratory infection, unspecified: Secondary | ICD-10-CM

## 2011-12-07 MED ORDER — DOXYCYCLINE HYCLATE 100 MG PO TABS: 100.0000 mg | ORAL_TABLET | Freq: Two times a day (BID) | ORAL | Status: DC

## 2011-12-07 MED ORDER — PREDNISONE (PAK) 10 MG PO TABS: 10.0000 mg | ORAL_TABLET | ORAL | Status: DC

## 2011-12-07 NOTE — Patient Instructions (Signed)
It was good to see you today. use doxycycline antibiotics twice a day for one week and prednisone taper for next 6 days to help cough and wheeze symptoms Your prescription(s) have been submitted to your pharmacy. Please take as directed and contact our office if you believe you are having problem(s) with the medication(s). Continue Spiriva and Symbicort inhalers as discussed, call if symptoms worse or unimproved with this treatment

## 2011-12-07 NOTE — Telephone Encounter (Signed)
I spoke with pt and he states he has been coughing and had some increase SOB x 1 week. I offered OV with CDY tomorrow. I advised pt if he felt like he could not wait until then then he either needed to go to UC/ED or call his pcp to see if he could get in with them. Pt voiced his understanding and states he will call his pcp first.

## 2011-12-07 NOTE — Progress Notes (Signed)
  Subjective:    HPI  complains of chest congestion and cold symptoms  Onset 4-5 days ago, wax/wane symptoms  Initially associated with rhinorrhea, sneezing, sore throat, mild headache and low grade fever Now shortness of breath, productive cough, myalgias, and mild-mod chest congestion No relief with OTC meds Variable compliance with prescribed inhalers, therapy limited by cost of medications Precipitated by sick contacts  Past Medical History  Diagnosis Date  . CAD, NATIVE VESSEL 03/15/2010  . CORONARY ARTERY DISEASE 05/14/2008  . Gout, unspecified 01/21/2008  . HYPERLIPIDEMIA 05/14/2008  . HYPERTENSION 05/14/2008  . HYPOKALEMIA 06/03/2009  . Other malaise and fatigue 03/15/2010  . PEPTIC ULCER DISEASE 05/14/2008  . TOBACCO ABUSE 03/15/2010  . AODM 01/21/2008    Review of Systems Constitutional: No night sweats, no unexpected weight change Pulmonary: No pleurisy or hemoptysis Cardiovascular: No chest pain or palpitations     Objective:   Physical Exam BP 120/82  Pulse 97  Temp(Src) 98.9 F (37.2 C) (Oral)  SpO2 90% GEN: nontoxic appearing but audible chest congestion HENT: NCAT, no sinus tenderness bilaterally, nares without clear discharge, oropharynx mild erythema, no exudate Eyes: Vision grossly intact, no conjunctivitis Lungs: L base rhonchi with faint end exp wheeze, no increased work of breathing at rest Cardiovascular: Regular rate and rhythm, no bilateral edema      Assessment & Plan:  Viral URI  Cough, postnasal drip related to above COPD exac due to above   Empiric antibiotics prescribed due to coexisting medical dz (COPD) Pred pak taper - new prescriptions done Continue spiriva and Symicort - encouraged compliance with these (limited due to cost) Symptomatic care with Tylenol or Advil, hydration and rest -  salt gargle advised as needed

## 2011-12-12 ENCOUNTER — Telehealth: Payer: Self-pay

## 2011-12-12 NOTE — Telephone Encounter (Signed)
Patient informed. 

## 2011-12-12 NOTE — Telephone Encounter (Signed)
Pt called stating he has not has a BM in 3 days. Pt has tried OTC Miralax with no help. Pt is appt for 12/14/2011 but is requesting Rx to treat until appt, please advise.

## 2011-12-12 NOTE — Telephone Encounter (Signed)
OK for OTC mag citrate - 1 bottle now

## 2011-12-14 ENCOUNTER — Ambulatory Visit (INDEPENDENT_AMBULATORY_CARE_PROVIDER_SITE_OTHER): Payer: BC Managed Care – PPO | Admitting: Internal Medicine

## 2011-12-14 ENCOUNTER — Encounter: Payer: Self-pay | Admitting: Internal Medicine

## 2011-12-14 VITALS — BP 100/72 | HR 108 | Temp 97.3°F

## 2011-12-14 DIAGNOSIS — K5900 Constipation, unspecified: Secondary | ICD-10-CM | POA: Diagnosis not present

## 2011-12-14 DIAGNOSIS — K5901 Slow transit constipation: Secondary | ICD-10-CM

## 2011-12-14 MED ORDER — LACTULOSE 10 GM/15ML PO SOLN: 20.0000 g | Freq: Three times a day (TID) | ORAL | Status: DC | PRN

## 2011-12-14 MED ORDER — SENNA-DOCUSATE SODIUM 8.6-50 MG PO TABS: 1.0000 | ORAL_TABLET | Freq: Every day | ORAL | Status: DC

## 2011-12-14 NOTE — Progress Notes (Signed)
  Subjective:    Patient ID: Ryan Contreras, male    DOB: [birth year], 71 y.o.   MRN: [id number]  Constipation This is a recurrent problem. The current episode started in the past 7 days. The problem is unchanged. His stool frequency is 1 time per week or less. The patient is not on a high fiber diet. He does not exercise regularly. There has not been adequate water intake. Associated symptoms include back pain and bloating. Pertinent negatives include no abdominal pain, diarrhea, fecal incontinence, fever, nausea, rectal pain, vomiting or weight loss. Risk factors include obesity. He has tried stool softeners for the symptoms. The treatment provided mild relief.    Past Medical History  Diagnosis Date  . CAD, NATIVE VESSEL   . Gout, unspecified   . HYPERLIPIDEMIA   . HYPERTENSION   . PEPTIC ULCER DISEASE   . TOBACCO ABUSE   . Type 2 diabetes mellitus     Review of Systems  Constitutional: Negative for fever and weight loss.  Gastrointestinal: Positive for constipation and bloating. Negative for nausea, vomiting, abdominal pain, diarrhea and rectal pain.  Musculoskeletal: Positive for back pain.       Objective:   Physical Exam BP 100/72  Pulse 108  Temp(Src) 97.3 F (36.3 C) (Oral)  SpO2 90% Gen: NAD Lung: CTA CV: RRR Abd: S +BS, NTND  Lab Results  Component Value Date   WBC 8.8 03/15/2010   HGB 14.4 03/15/2010   HCT 42.9 03/15/2010   PLT 306.0 03/15/2010   GLUCOSE 100* 04/18/2011   CHOL 114 04/18/2011   TRIG 46.0 04/18/2011   HDL 55.30 04/18/2011   LDLCALC 50 04/18/2011   ALT 21 06/03/2009   AST 25 03/15/2010   NA 139 04/18/2011   K 4.0 04/18/2011   CL 96 04/18/2011   CREATININE 0.7 04/18/2011   BUN 12 04/18/2011   CO2 37* 04/18/2011   TSH 0.51 03/15/2010   PSA 4.40* 08/11/2010   HGBA1C 6.6* 08/11/2010   MICROALBUR 1.5 08/11/2010       Assessment & Plan:  Constipation - lactulose and Senna - also advised Witch hazel pads and increased water/fiber

## 2011-12-14 NOTE — Patient Instructions (Signed)
It was good to see you today. For your constipation, use Lactulose drink 3x/day until bowels start - also start Senokot S every day Your prescription(s) have been submitted to your pharmacy. Please take as directed and contact our office if you believe you are having problem(s) with the medication(s). Constipation in Adults Constipation is having fewer than 2 bowel movements per week. Usually, the stools are hard. As we grow older, constipation is more common. If you try to fix constipation with laxatives, the problem may get worse. This is because laxatives taken over a long period of time make the colon muscles weaker. A low-fiber diet, not taking in enough fluids, and taking some medicines may make these problems worse. MEDICATIONS THAT MAY CAUSE CONSTIPATION  Water pills (diuretics).     Calcium channel blockers (used to control blood pressure and for the heart).     Certain pain medicines (narcotics).     Anticholinergics.    Anti-inflammatory agents.     Antacids that contain aluminum.  DISEASES THAT CONTRIBUTE TO CONSTIPATION  Diabetes.     Parkinson's disease.     Dementia.    Stroke.    Depression.    Illnesses that cause problems with salt and water metabolism.  HOME CARE INSTRUCTIONS    Constipation is usually best cared for without medicines. Increasing dietary fiber and eating more fruits and vegetables is the best way to manage constipation.     Slowly increase fiber intake to 25 to 38 grams per day. Whole grains, fruits, vegetables, and legumes are good sources of fiber. A dietitian can further help you incorporate high-fiber foods into your diet.     Drink enough water and fluids to keep your urine clear or pale yellow.     A fiber supplement may be added to your diet if you cannot get enough fiber from foods.     Increasing your activities also helps improve regularity.     Suppositories, as suggested by your caregiver, will also help. If you are using  antacids, such as aluminum or calcium containing products, it will be helpful to switch to products containing magnesium if your caregiver says it is okay.     If you have been given a liquid injection (enema) today, this is only a temporary measure. It should not be relied on for treatment of longstanding (chronic) constipation.     Stronger measures, such as magnesium sulfate, should be avoided if possible. This may cause uncontrollable diarrhea. Using magnesium sulfate may not allow you time to make it to the bathroom.  SEEK IMMEDIATE MEDICAL CARE IF:    There is bright red blood in the stool.     The constipation stays for more than 4 days.     There is belly (abdominal) or rectal pain.     You do not seem to be getting better.     You have any questions or concerns.  MAKE SURE YOU:    Understand these instructions.     Will watch your condition.     Will get help right away if you are not doing well or get worse.  Document Released: 08/26/2004 Document Revised: 08/10/2011 Document Reviewed: 07/16/2008 [facility] Patient Information 2012 [place], [place].

## 2011-12-16 ENCOUNTER — Other Ambulatory Visit (INDEPENDENT_AMBULATORY_CARE_PROVIDER_SITE_OTHER): Payer: BC Managed Care – PPO

## 2011-12-16 ENCOUNTER — Ambulatory Visit (INDEPENDENT_AMBULATORY_CARE_PROVIDER_SITE_OTHER): Payer: BC Managed Care – PPO | Admitting: Endocrinology

## 2011-12-16 ENCOUNTER — Encounter: Payer: Self-pay | Admitting: Endocrinology

## 2011-12-16 DIAGNOSIS — Z79899 Other long term (current) drug therapy: Secondary | ICD-10-CM

## 2011-12-16 DIAGNOSIS — I10 Essential (primary) hypertension: Secondary | ICD-10-CM | POA: Diagnosis not present

## 2011-12-16 DIAGNOSIS — R972 Elevated prostate specific antigen [PSA]: Secondary | ICD-10-CM | POA: Diagnosis not present

## 2011-12-16 DIAGNOSIS — E785 Hyperlipidemia, unspecified: Secondary | ICD-10-CM | POA: Diagnosis not present

## 2011-12-16 DIAGNOSIS — E119 Type 2 diabetes mellitus without complications: Secondary | ICD-10-CM

## 2011-12-16 DIAGNOSIS — E876 Hypokalemia: Secondary | ICD-10-CM | POA: Diagnosis not present

## 2011-12-16 DIAGNOSIS — M109 Gout, unspecified: Secondary | ICD-10-CM

## 2011-12-16 DIAGNOSIS — I2510 Atherosclerotic heart disease of native coronary artery without angina pectoris: Secondary | ICD-10-CM

## 2011-12-16 LAB — CBC WITH DIFFERENTIAL/PLATELET
Basophils Relative: 0.1 % (ref 0.0–3.0)
Eosinophils Absolute: 0.1 10*3/uL (ref 0.0–0.7)
Eosinophils Relative: 0.4 % (ref 0.0–5.0)
Hemoglobin: 13.5 g/dL (ref 13.0–17.0)
Lymphocytes Relative: 11.5 % — ABNORMAL LOW (ref 12.0–46.0)
MCHC: 32.9 g/dL (ref 30.0–36.0)
Neutro Abs: 17.9 10*3/uL — ABNORMAL HIGH (ref 1.4–7.7)
Neutrophils Relative %: 78.2 % — ABNORMAL HIGH (ref 43.0–77.0)
RBC: 4.18 Mil/uL — ABNORMAL LOW (ref 4.22–5.81)
WBC: 22.9 10*3/uL (ref 4.5–10.5)

## 2011-12-16 LAB — URINALYSIS, ROUTINE W REFLEX MICROSCOPIC
Bilirubin Urine: NEGATIVE
Leukocytes, UA: NEGATIVE
Nitrite: NEGATIVE
Specific Gravity, Urine: 1.01 (ref 1.000–1.030)
Total Protein, Urine: NEGATIVE
pH: 6.5 (ref 5.0–8.0)

## 2011-12-16 LAB — HEMOGLOBIN A1C
Hgb A1c MFr Bld: 6.9 % — ABNORMAL HIGH (ref 4.6–6.5)
Hgb A1c MFr Bld: 6.9 % — ABNORMAL HIGH (ref 4.6–6.5)

## 2011-12-16 LAB — MICROALBUMIN / CREATININE URINE RATIO
Creatinine,U: 83.7 mg/dL
Microalb Creat Ratio: 0.7 mg/g (ref 0.0–30.0)
Microalb, Ur: 0.6 mg/dL (ref 0.0–1.9)

## 2011-12-16 MED ORDER — FLUCONAZOLE 150 MG PO TABS: 150.0000 mg | ORAL_TABLET | Freq: Every day | ORAL | Status: AC

## 2011-12-16 MED ORDER — HYDROCODONE-ACETAMINOPHEN 10-325 MG PO TABS: 1.0000 | ORAL_TABLET | Freq: Four times a day (QID) | ORAL | Status: AC | PRN

## 2011-12-16 MED ORDER — LACTULOSE 10 GM/15ML PO SOLN: 20.0000 g | Freq: Two times a day (BID) | ORAL | Status: AC

## 2011-12-16 NOTE — Progress Notes (Signed)
  Subjective:    Patient ID: Ryan Contreras, male    DOB: 1941/12/04, 71 y.o.   MRN: [id number]  HPI Pt reports 1 week of pain at the perineal area.  No assoc brbpr.   Constipation:  Much better with lactulose. DM:  He has lost a few lbs only.   Past Medical History  Diagnosis Date  . CAD, NATIVE VESSEL   . Gout, unspecified   . HYPERLIPIDEMIA   . HYPERTENSION   . PEPTIC ULCER DISEASE   . TOBACCO ABUSE   . Type 2 diabetes mellitus     Past Surgical History  Procedure Laterality Date  . Coronary stent placement      s/p    History   Social History  . Marital Status: Single    Spouse Name: N/A    Number of Children: 2  . 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 Smoking status: Current Every Day Smoker -- 1.00 packs/day for 50 years    Types: Cigarettes  . Smokeless tobacco: Not on file     Comment: currently smoking 5 to 6 cigs daily  . Alcohol Use: Yes     Comment: 1 beer/day  . Drug Use: Not on file  . Sexually Active: Not on file   Other Topics Concern  . Not on file   Social History Narrative   Widowed 2003    Current Outpatient Prescriptions on File Prior to Visit  Medication Sig Dispense Refill  . aspirin 81 MG tablet Take 81 mg by mouth daily.        Marland Kitchen glucose blood (ACCU-CHEK COMPACT STRIPS) test strip Once daily, dx 250.00       . methocarbamol (ROBAXIN) 500 MG tablet 1 tablet at bedtime as needed for cramps       No current facility-administered medications on file prior to visit.    No Known Allergies  Family History  Problem Relation Age of Onset  . Cancer Sister     Uncertain type  . Cancer Brother     Uncertain type    BP 92/62  Pulse 93  Temp(Src) 98.7 F (37.1 C) (Oral)  Ht 6\' 1"  (1.854 m)  Wt 184 lb 9.6 oz (83.734 kg)  BMI 24.36 kg/m2  SpO2 93%    Review of Systems  Gastrointestinal: Negative for anal bleeding.  Genitourinary: Negative for hematuria and  difficulty urinating.       Objective:   Physical Exam VITAL SIGNS:  See vs page GENERAL: no distress ABDOMEN: abdomen is soft, nontender.  no hepatosplenomegaly.   not distended.  no hernia Rectal: nst/heme neg.  There is slight swelling/tend at the perineal area.    a1c is noted    Assessment & Plan:  Rectal pain, new, uncertain etiology Constipation, improved DM, well-controlled

## 2011-12-16 NOTE — Patient Instructions (Addendum)
Reduce lactulose to 2x a day. blood and urine tests are being requested for you today.  please call [phone] to hear your test results.  You will be prompted to enter the 9-digit "MRN" number that appears at the top left of this page, followed by #.  Then you will hear the message. I hope you feel better soon.  If you don't feel better by next week, please call back.  i have sent a prescription to your pharmacy, for a different type of antibiotic. Here is a prescription for a pain pill.

## 2011-12-17 ENCOUNTER — Encounter: Payer: Self-pay | Admitting: Family Medicine

## 2011-12-17 ENCOUNTER — Ambulatory Visit (INDEPENDENT_AMBULATORY_CARE_PROVIDER_SITE_OTHER): Payer: BC Managed Care – PPO | Admitting: Family Medicine

## 2011-12-17 VITALS — BP 100/60 | HR 86 | Temp 97.7°F | Ht 73.0 in | Wt 185.0 lb

## 2011-12-17 DIAGNOSIS — K612 Anorectal abscess: Secondary | ICD-10-CM

## 2011-12-17 DIAGNOSIS — K610 Anal abscess: Secondary | ICD-10-CM

## 2011-12-17 NOTE — Progress Notes (Signed)
  Subjective:    Patient ID: Ryan Contreras, male    DOB: [birth year], 71 y.o.   MRN: [id number]  HPI  Acute visit. Saturday clinic. Few day history of perianal pain. Seen yesterday. Complete blood count significant for white blood cells 22,000. Patient was told (per answering service) to followup today to reassess. Has had some progression of perianal pain and swelling. No fever or chills. Appetite fair. Has pain with sitting and walking.  History type 2 diabetes.   Review of Systems  Constitutional: Negative for fever, chills and appetite change.  Gastrointestinal: Negative for abdominal pain and diarrhea.       Objective:   Physical Exam  Constitutional: He appears well-developed and well-nourished.  Cardiovascular: Normal rate and regular rhythm.   Pulmonary/Chest: Effort normal and breath sounds normal. No respiratory distress. He has no wheezes. He has no rales.  Abdominal: Soft. There is no tenderness.  Genitourinary:       Patient has significant perianal swelling mostly around the 12:00 position. Tender to palpation with some increased fluctuance. No erythema.          Assessment & Plan:  Large perianal abscess. We recommended incision and drainage and patient consented. Skin prepped with Betadine. Anesthesia 1% plain Xylocaine. Using a #11 blade a linear incision made approximately one and1/2 cm length area of maximum fluctuance. Copious amount of purulence expressed. Abscess cavity explored with curved hemostats. Cavity packed with quarter-inch gauze. Patient tolerated well. Outer dressing applied. Wound care instruction given. Keep dry. Reassess in 2 days.  Patient felt better with considerably less pain following I&D

## 2011-12-17 NOTE — Patient Instructions (Signed)
Keep area dry until follow up. You will need to have packing removed then. Go ahead and start antibiotic prescribed

## 2011-12-19 ENCOUNTER — Ambulatory Visit (INDEPENDENT_AMBULATORY_CARE_PROVIDER_SITE_OTHER): Payer: BC Managed Care – PPO | Admitting: Endocrinology

## 2011-12-19 ENCOUNTER — Telehealth: Payer: Self-pay

## 2011-12-19 ENCOUNTER — Encounter: Payer: Self-pay | Admitting: Endocrinology

## 2011-12-19 VITALS — BP 122/66 | HR 77 | Temp 98.0°F | Ht 72.0 in | Wt 184.0 lb

## 2011-12-19 DIAGNOSIS — R972 Elevated prostate specific antigen [PSA]: Secondary | ICD-10-CM

## 2011-12-19 DIAGNOSIS — K612 Anorectal abscess: Secondary | ICD-10-CM

## 2011-12-19 LAB — BASIC METABOLIC PANEL
CO2: 34 mEq/L — ABNORMAL HIGH (ref 19–32)
Calcium: 8.6 mg/dL (ref 8.4–10.5)
Creatinine, Ser: 0.9 mg/dL (ref 0.4–1.5)
GFR: 115.26 mL/min (ref >60.00)
Sodium: 131 mEq/L — ABNORMAL LOW (ref 135–145)

## 2011-12-19 LAB — HEPATIC FUNCTION PANEL
ALT: 26 U/L (ref 0–53)
Bilirubin, Direct: 0.2 mg/dL (ref 0.0–0.3)
Total Bilirubin: 0.7 mg/dL (ref 0.3–1.2)
Total Protein: 8 g/dL (ref 6.0–8.3)

## 2011-12-19 LAB — LIPID PANEL
LDL Cholesterol: 51 mg/dL (ref 0–99)
Total CHOL/HDL Ratio: 2

## 2011-12-19 LAB — TSH: TSH: 0.56 u[IU]/mL (ref 0.35–5.50)

## 2011-12-19 LAB — URIC ACID: Uric Acid, Serum: 3.5 mg/dL — ABNORMAL LOW (ref 4.0–7.8)

## 2011-12-19 NOTE — Telephone Encounter (Signed)
[facility] Triage Call Report Triage Record Num: [id number] Operator: Kelle Darting Patient Name: [facility] Call Date & Time: 12/16/2011 6:00:53PM Patient Phone: [phone] PCP: Romero Belling Patient Gender: Male PCP Fax : [phone] Patient DOB: June 27, 1941 Practice Name: [facility] Reason for Call: Caller: Karen/; PCP: Romero Belling; CB#: [phone]; Call Reason: Clydie Braun is calling from [facility] regarding a CBC ordered on Jameson, Burda by [name], Sean.;Critical result of a WBC: 22,900; Pt's CB # of [phone] OR [phone]; Sx Onset: 6 days ago (12/11/11); Sx notes: Pt. was seen for perineal pain 12/16/11, no fever at office per Dr. Caryl Never who was on-call; Pt. at this time is feeling OK, still having pain that is the same, denies any chills at this time, was placed on Abx that he is to start tonight; Afebrile; Guideline Used: Note; Appt Scheduled?: No; Pt. understands that if his pain gets worse or if he starts having chills he is to go to the ED for evaluation per Dr. Caryl Never, if things are OK, can be re-evaluated in the office tomorrow. Protocol(s) Used: Office Note Recommended Outcome per Protocol: Information Noted and Sent to Office Reason for Outcome: Caller information to office Care Advice: ~ 12/16/2011 6:30:05PM Page 1 of 1 [email or web address]

## 2011-12-19 NOTE — Patient Instructions (Addendum)
Keep the draining area covered with paper towels, until the draining stops.   I hope you feel better soon.  If you don't feel better by next week, please call back.   (update: ref urol)

## 2011-12-23 NOTE — Progress Notes (Signed)
Subjective:    Patient ID: Ryan Contreras, male    DOB: 06/27/1941, 71 y.o.   MRN: [id number]  HPI The state of at least three ongoing medical problems is addressed today: DM: he has not required any med for this.  No weight change. Pt had i and d of perineal abscess 2 days ago.  He feels better. Pt has not recently seen urol for elev psa.  No urol sxs Past Medical History  Diagnosis Date  . CAD, NATIVE VESSEL   . Gout, unspecified   . HYPERLIPIDEMIA   . HYPERTENSION   . PEPTIC ULCER DISEASE   . TOBACCO ABUSE   . Type 2 diabetes mellitus     Past Surgical History  Procedure Date  . Coronary stent placement     s/p    History   Social History  . Marital Status: Single    Spouse Name: N/A    Number of Children: 2  . 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 Smoking status: Current Everyday Smoker -- 1.0 packs/day for 50 years    Types: Cigarettes  . Smokeless tobacco: Not on file   Comment: currently smoking 5 to 6 cigs daily  . Alcohol Use: Yes     1 beer/day  . Drug Use: Not on file  . Sexually Active: Not on file   Other Topics Concern  . Not on file   Social History Narrative   Widowed 2003    Current Outpatient Prescriptions on File Prior to Visit  Medication Sig Dispense Refill  . allopurinol (ZYLOPRIM) 300 MG tablet Take 1 tablet (300 mg total) by mouth daily.  30 tablet  4  . aspirin 81 MG tablet Take 81 mg by mouth daily.        . budesonide-formoterol (SYMBICORT) 160-4.5 MCG/ACT inhaler Inhale 2 puffs into the lungs 2 (two) times daily.        . clopidogrel (PLAVIX) 75 MG tablet TAKE 1 TABLET BY MOUTH EVERY DAY  30 tablet  6  . CRESTOR 40 MG tablet TAKE 1 TABLET BY MOUTH EVERY DAY  30 tablet  4  . glucose blood (ACCU-CHEK COMPACT STRIPS) test strip Once daily, dx 250.00       . HYDROcodone-acetaminophen (NORCO) 10-325 MG per tablet Take 1 tablet by mouth every 6 (six) hours as needed for  pain.  30 tablet  0  . KLOR-CON 10 10 MEQ CR tablet TAKE 1 TABLET DAILY  30 tablet  4  . lactulose (CHRONULAC) 10 GM/15ML solution Take 30 mLs (20 g total) by mouth 2 (two) times daily.  240 mL  5  . losartan-hydrochlorothiazide (HYZAAR) 100-25 MG per tablet Take 1 tablet by mouth daily.  30 tablet  12  . methocarbamol (ROBAXIN) 500 MG tablet 1 tablet at bedtime as needed for cramps      . sennosides-docusate sodium (SENOKOT-S) 8.6-50 MG tablet Take 1 tablet by mouth daily.  30 tablet  0  . tiotropium (SPIRIVA HANDIHALER) 18 MCG inhalation capsule Place 1 capsule (18 mcg total) into inhaler and inhale daily.  30 capsule  6  . VIAGRA 100 MG tablet TAKE AS NEEDED  1 tablet  4    No Known Allergies  Family History  Problem Relation Age of Onset  . Cancer Sister     Uncertain type  . Cancer Brother     Uncertain type    BP  122/66  Pulse 77  Temp(Src) 98 F (36.7 C) (Oral)  Ht 6' (1.829 m)  Wt 184 lb (83.462 kg)  BMI 24.95 kg/m2  SpO2 93%  Review of Systems Denies fever and dysuria    Objective:   Physical Exam VITAL SIGNS:  See vs page GENERAL: no distress Perineum:  Slight drainage persists.  Slight tenderness, but no fluctuance  Lab Results  Component Value Date   WBC 22.9* 12/16/2011   HGB 13.5 12/16/2011   HCT 40.9 12/16/2011   PLT 429.0* 12/16/2011   GLUCOSE 103* 12/16/2011   CHOL 123 12/16/2011   TRIG 77.0 12/16/2011   HDL 56.60 12/16/2011   LDLCALC 51 12/16/2011   ALT 26 12/16/2011   AST 29 12/16/2011   NA 131* 12/16/2011   K 4.6 12/16/2011   CL 89* 12/16/2011   CREATININE 0.9 12/16/2011   BUN 14 12/16/2011   CO2 34* 12/16/2011   TSH 0.56 12/16/2011   PSA 4.70* 12/16/2011   HGBA1C 6.9* 12/16/2011   MICROALBUR 0.6 12/16/2011      Assessment & Plan:  Perineal abscess, better elev psa, slightly worse DM, well-controlled

## 2011-12-27 ENCOUNTER — Ambulatory Visit: Payer: BC Managed Care – PPO | Admitting: Internal Medicine

## 2012-01-06 ENCOUNTER — Encounter: Payer: Self-pay | Admitting: Endocrinology

## 2012-01-06 ENCOUNTER — Ambulatory Visit (INDEPENDENT_AMBULATORY_CARE_PROVIDER_SITE_OTHER)
Admission: RE | Admit: 2012-01-06 | Discharge: 2012-01-06 | Disposition: A | Discharge status: Home / Self Care | Payer: BC Managed Care – PPO | Source: Ambulatory Visit | Attending: Endocrinology | Admitting: Endocrinology

## 2012-01-06 ENCOUNTER — Ambulatory Visit (INDEPENDENT_AMBULATORY_CARE_PROVIDER_SITE_OTHER): Payer: BC Managed Care – PPO | Admitting: Endocrinology

## 2012-01-06 VITALS — BP 130/88 | HR 101 | Temp 97.9°F | Ht 73.0 in | Wt 185.0 lb

## 2012-01-06 DIAGNOSIS — J209 Acute bronchitis, unspecified: Secondary | ICD-10-CM

## 2012-01-06 DIAGNOSIS — R05 Cough: Secondary | ICD-10-CM

## 2012-01-06 DIAGNOSIS — R059 Cough, unspecified: Secondary | ICD-10-CM

## 2012-01-06 MED ORDER — PROMETHAZINE-CODEINE 6.25-10 MG/5ML PO SYRP: 5.0000 mL | ORAL_SOLUTION | ORAL | Status: AC | PRN

## 2012-01-06 MED ORDER — AZITHROMYCIN 500 MG PO TABS: 500.0000 mg | ORAL_TABLET | Freq: Every day | ORAL | Status: AC

## 2012-01-06 NOTE — Progress Notes (Signed)
Subjective:    Patient ID: Ryan Contreras, male    DOB: 22-Feb-1941, 71 y.o.   MRN: [id number]  HPI Pt states few weeks of slight prod-quality cough in the chest, but no assoc fever. Past Medical History  Diagnosis Date  . CAD, NATIVE VESSEL   . Gout, unspecified   . HYPERLIPIDEMIA   . HYPERTENSION   . PEPTIC ULCER DISEASE   . TOBACCO ABUSE   . Type 2 diabetes mellitus     Past Surgical History  Procedure Date  . Coronary stent placement     s/p    History   Social History  . Marital Status: Single    Spouse Name: N/A    Number of Children: 2  . 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 Smoking status: Current Everyday Smoker -- 1.0 packs/day for 50 years    Types: Cigarettes  . Smokeless tobacco: Not on file   Comment: currently smoking 5 to 6 cigs daily  . Alcohol Use: Yes     1 beer/day  . Drug Use: Not on file  . Sexually Active: Not on file   Other Topics Concern  . Not on file   Social History Narrative   Widowed 2003    Current Outpatient Prescriptions on File Prior to Visit  Medication Sig Dispense Refill  . allopurinol (ZYLOPRIM) 300 MG tablet Take 1 tablet (300 mg total) by mouth daily.  30 tablet  4  . aspirin 81 MG tablet Take 81 mg by mouth daily.        . budesonide-formoterol (SYMBICORT) 160-4.5 MCG/ACT inhaler Inhale 2 puffs into the lungs 2 (two) times daily.        . clopidogrel (PLAVIX) 75 MG tablet TAKE 1 TABLET BY MOUTH EVERY DAY  30 tablet  6  . CRESTOR 40 MG tablet TAKE 1 TABLET BY MOUTH EVERY DAY  30 tablet  4  . glucose blood (ACCU-CHEK COMPACT STRIPS) test strip Once daily, dx 250.00       . KLOR-CON 10 10 MEQ CR tablet TAKE 1 TABLET DAILY  30 tablet  4  . losartan-hydrochlorothiazide (HYZAAR) 100-25 MG per tablet Take 1 tablet by mouth daily.  30 tablet  12  . methocarbamol (ROBAXIN) 500 MG tablet 1 tablet at bedtime as needed for cramps      . sennosides-docusate  sodium (SENOKOT-S) 8.6-50 MG tablet Take 1 tablet by mouth daily.  30 tablet  0  . tiotropium (SPIRIVA HANDIHALER) 18 MCG inhalation capsule Place 1 capsule (18 mcg total) into inhaler and inhale daily.  30 capsule  6  . VIAGRA 100 MG tablet TAKE AS NEEDED  1 tablet  4    No Known Allergies  Family History  Problem Relation Age of Onset  . Cancer Sister     Uncertain type  . Cancer Brother     Uncertain type    BP 130/88  Pulse 101  Temp(Src) 97.9 F (36.6 C) (Oral)  Ht 6\' 1"  (1.854 m)  Wt 185 lb (83.915 kg)  BMI 24.41 kg/m2  SpO2 93%    Review of Systems Denies earache and sob.      Objective:   Physical Exam VITAL SIGNS:  See vs page GENERAL: no distress head: no deformity eyes: no periorbital swelling, no proptosis external nose and ears are normal mouth: no lesion seen LUNGS:  Clear to auscultation   CXR: NAD  Assessment & Plan:  URI, new

## 2012-01-06 NOTE — Patient Instructions (Addendum)
A chest-x-ray is being requested for you today.  please call [phone] to hear your test results.  You will be prompted to enter the 9-digit "MRN" number that appears at the top left of this page, followed by #.  Then you will hear the message. Here are 2 prescriptions:  Antibiotic, and cough syrup. here is a sample of "advair-250."  take 1 puff 2x a day.  rinse mouth after using. I hope you feel better soon.  If you don't feel better by next week, please call back.   (update: i left message on phone-tree:  rx as we discussed)

## 2012-01-16 ENCOUNTER — Other Ambulatory Visit: Payer: Self-pay | Admitting: Endocrinology

## 2012-01-25 DIAGNOSIS — N401 Enlarged prostate with lower urinary tract symptoms: Secondary | ICD-10-CM | POA: Diagnosis not present

## 2012-01-25 DIAGNOSIS — R972 Elevated prostate specific antigen [PSA]: Secondary | ICD-10-CM | POA: Diagnosis not present

## 2012-01-25 DIAGNOSIS — N529 Male erectile dysfunction, unspecified: Secondary | ICD-10-CM | POA: Diagnosis not present

## 2012-02-04 ENCOUNTER — Other Ambulatory Visit: Payer: Self-pay | Admitting: Endocrinology

## 2012-02-20 ENCOUNTER — Other Ambulatory Visit: Payer: Self-pay | Admitting: Internal Medicine

## 2012-03-03 ENCOUNTER — Other Ambulatory Visit: Payer: Self-pay | Admitting: Endocrinology

## 2012-03-23 ENCOUNTER — Telehealth: Payer: Self-pay | Admitting: Endocrinology

## 2012-03-23 NOTE — Telephone Encounter (Signed)
Per pt lost inhaler -requesting another--pt ph# [phone]

## 2012-03-23 NOTE — Telephone Encounter (Signed)
Pt has lost his Spriva inhaler and wants office to call pharmacy so that he can get an early refill-was prescribed by Dr. Zada Girt.

## 2012-03-23 NOTE — Telephone Encounter (Signed)
Pt has refills, but insurance won't cover rx until 4/16, pt just picked up rx on 3/23, without insurance it will be $290. Called pt to inform, no answer, unable to leave message.

## 2012-03-23 NOTE — Telephone Encounter (Signed)
Please refill x 1  

## 2012-03-26 NOTE — Telephone Encounter (Signed)
Pt informed that he can pick up Spiriva inhaler tomorrow.

## 2012-04-11 ENCOUNTER — Encounter: Payer: Self-pay | Admitting: Pulmonary Disease

## 2012-04-11 ENCOUNTER — Ambulatory Visit (INDEPENDENT_AMBULATORY_CARE_PROVIDER_SITE_OTHER): Payer: BC Managed Care – PPO | Admitting: Pulmonary Disease

## 2012-04-11 VITALS — BP 124/68 | HR 91 | Temp 98.0°F | Ht 73.0 in | Wt 193.6 lb

## 2012-04-11 DIAGNOSIS — J449 Chronic obstructive pulmonary disease, unspecified: Secondary | ICD-10-CM | POA: Diagnosis not present

## 2012-04-11 MED ORDER — BUDESONIDE-FORMOTEROL FUMARATE 160-4.5 MCG/ACT IN AERO: 2.0000 | INHALATION_SPRAY | Freq: Two times a day (BID) | RESPIRATORY_TRACT | Status: DC

## 2012-04-11 NOTE — Progress Notes (Signed)
  Subjective:    Patient ID: Ryan Contreras, male    DOB: 1941-06-25, 71 y.o.   MRN: [id number]  HPI The patient comes in today for followup of his COPD.  He is stable on Spiriva, but he has stopped using symbicort because his prescription ran out.  He has had a few episodes of acute asthmatic bronchitis since the last visit, however is continuing to smoke.  He currently feels that he is at his usual baseline with no chest congestion or purulence.   Review of Systems  Constitutional: Negative for fever and unexpected weight change.  HENT: Positive for congestion. Negative for ear pain, nosebleeds, sore throat, rhinorrhea, sneezing, trouble swallowing, dental problem, postnasal drip and sinus pressure.   Eyes: Negative for redness and itching.  Respiratory: Positive for cough and wheezing. Negative for chest tightness and shortness of breath.   Cardiovascular: Negative for palpitations and leg swelling.  Gastrointestinal: Negative for nausea and vomiting.  Genitourinary: Negative for dysuria.  Musculoskeletal: Negative for joint swelling.  Skin: Negative for rash.  Neurological: Negative for headaches.  Hematological: Does not bruise/bleed easily.  Psychiatric/Behavioral: Negative for dysphoric mood. The patient is not nervous/anxious.        Objective:   Physical Exam Well developed male in nad Nose without purulence or discharge Chest with decreased bs, no wheezing Cor with rrr LE without edema, no cyanosis Alert and oriented, moves all 4 extremities       Assessment & Plan:

## 2012-04-11 NOTE — Assessment & Plan Note (Signed)
The patient has severe obstructive lung disease, and unfortunately continues to smoke.  He has discontinued symbicort on his own, and I stressed to him the importance of inhaled corticosteroids in this patients who are having recurrent exacerbations.  I have also explained to him that smoking cessation would probably eliminate most of his flareups.  He will follow up in 6 months if doing well.

## 2012-04-11 NOTE — Patient Instructions (Signed)
Stay on spiriva, and you need to get back on symbicort to prevent "flareups".   Stop smoking.  This is the key to staying well.  Stay as active as possible. followup with me in 6mos.

## 2012-04-19 ENCOUNTER — Encounter: Payer: Self-pay | Admitting: Internal Medicine

## 2012-04-19 ENCOUNTER — Ambulatory Visit (INDEPENDENT_AMBULATORY_CARE_PROVIDER_SITE_OTHER): Payer: BC Managed Care – PPO | Admitting: Internal Medicine

## 2012-04-19 VITALS — BP 100/52 | HR 84 | Temp 97.8°F | Resp 16 | Wt 191.5 lb

## 2012-04-19 DIAGNOSIS — H60399 Other infective otitis externa, unspecified ear: Secondary | ICD-10-CM | POA: Diagnosis not present

## 2012-04-19 DIAGNOSIS — H6690 Otitis media, unspecified, unspecified ear: Secondary | ICD-10-CM | POA: Insufficient documentation

## 2012-04-19 DIAGNOSIS — H6090 Unspecified otitis externa, unspecified ear: Secondary | ICD-10-CM

## 2012-04-19 MED ORDER — NEOMYCIN-COLIST-HC-THONZONIUM 3.3-3-10-0.5 MG/ML OT SUSP: 3.0000 [drp] | Freq: Four times a day (QID) | OTIC | Status: AC

## 2012-04-19 MED ORDER — AMOXICILLIN 875 MG PO TABS: 875.0000 mg | ORAL_TABLET | Freq: Two times a day (BID) | ORAL | Status: DC

## 2012-04-19 NOTE — Patient Instructions (Signed)
Otitis Externa Otitis externa ("swimmer's ear") is a germ (bacterial) or fungal infection of the outer ear canal (from the eardrum to the outside of the ear). Swimming in dirty water may cause swimmer's ear. It also may be caused by moisture in the ear from water remaining after swimming or bathing. Often the first signs of infection may be itching in the ear canal. This may progress to ear canal swelling, redness, and pus drainage, which may be signs of infection. HOME CARE INSTRUCTIONS   Apply the antibiotic drops to the ear canal as prescribed by your doctor.   This can be a very painful medical condition. A strong pain reliever may be prescribed.   Only take over-the-counter or prescription medicines for pain, discomfort, or fever as directed by your caregiver.   If your caregiver has given you a follow-up appointment, it is very important to keep that appointment. Not keeping the appointment could result in a chronic or permanent injury, pain, hearing loss and disability. If there is any problem keeping the appointment, you must call back to this facility for assistance.  PREVENTION   It is important to keep your ear dry. Use the corner of a towel to wick water out of the ear canal after swimming or bathing.   Avoid scratching in your ear. This can damage the ear canal or remove the protective wax lining the canal and make it easier for germs (bacteria) or a fungus to grow.   You may use ear drops made of rubbing alcohol and vinegar after swimming to prevent future "swimmer's ear" infections. Make up a small bottle of equal parts white vinegar and alcohol. Put 3 or 4 drops into each ear after swimming.   Avoid swimming in lakes, polluted water, or poorly chlorinated pools.  SEEK MEDICAL CARE IF:   An oral temperature above 102 F (38.9 C) develops.   Your ear is still painful after 3 days and shows signs of getting worse (redness, swelling, pain, or pus).  MAKE SURE YOU:   Understand  these instructions.   Will watch your condition.   Will get help right away if you are not doing well or get worse.  Document Released: 11/28/2005 Document Revised: 11/17/2011 Document Reviewed: 07/04/2008 Exit[employer] Patient Information 2012 [employer]   Otitis Media, Adult A middle ear infection is an infection in the space behind the eardrum. The medical name for this is "otitis media." It may happen after a common cold. It is caused by a germ that starts growing in that space. You may feel swollen glands in your neck on the side of the ear infection. HOME CARE INSTRUCTIONS   Take your medicine as directed until it is gone, even if you feel better after the first few days.   Only take over-the-counter or prescription medicines for pain, discomfort, or fever as directed by your caregiver.   Occasional use of a nasal decongestant a couple times per day may help with discomfort and help the eustachian tube to drain better.  Follow up with your caregiver in 10 to 14 days or as directed, to be certain that the infection has cleared. Not keeping the appointment could result in a chronic or permanent injury, pain, hearing loss and disability. If there is any problem keeping the appointment, you must call back to this facility for assistance. SEEK IMMEDIATE MEDICAL CARE IF:   You are not getting better in 2 to 3 days.   You have pain that is not controlled   with medication.   You feel worse instead of better.   You cannot use the medication as directed.   You develop swelling, redness or pain around the ear or stiffness in your neck.  MAKE SURE YOU:   Understand these instructions.   Will watch your condition.   Will get help right away if you are not doing well or get worse.  Document Released: 09/02/2004 Document Revised: 11/17/2011 Document Reviewed: 07/04/2008 Exit[employer] Patient Information 2012 [employer]. 

## 2012-04-20 ENCOUNTER — Encounter: Payer: Self-pay | Admitting: Internal Medicine

## 2012-04-20 NOTE — Assessment & Plan Note (Signed)
Start amoxil 

## 2012-04-20 NOTE — Progress Notes (Signed)
Subjective:    Patient ID: Ryan Contreras, male    DOB: 01-28-1941, 71 y.o.   MRN: [id number]  Otalgia  There is pain in the right ear. This is a new problem. The current episode started in the past 7 days. The problem occurs constantly. The problem has been gradually worsening. There has been no fever. The pain is at a severity of 2/10. The pain is mild. Pertinent negatives include no abdominal pain, coughing, diarrhea, drainage, ear discharge, headaches, hearing loss, neck pain, rash, rhinorrhea, sore throat or vomiting. He has tried nothing for the symptoms.      Review of Systems  Constitutional: Negative for fever, chills, diaphoresis, activity change, appetite change, fatigue and unexpected weight change.  HENT: Positive for ear pain. Negative for hearing loss, nosebleeds, congestion, sore throat, facial swelling, rhinorrhea, sneezing, drooling, mouth sores, trouble swallowing, neck pain, neck stiffness, dental problem, voice change, postnasal drip, sinus pressure, tinnitus and ear discharge.   Eyes: Negative.   Respiratory: Negative for apnea, cough, choking, chest tightness, shortness of breath, wheezing and stridor.   Cardiovascular: Negative for chest pain, palpitations and leg swelling.  Gastrointestinal: Negative for vomiting, abdominal pain and diarrhea.  Genitourinary: Negative.   Musculoskeletal: Negative for myalgias, back pain, joint swelling, arthralgias and gait problem.  Skin: Negative for color change, pallor, rash and wound.  Neurological: Negative for headaches.  Hematological: Negative for adenopathy. Does not bruise/bleed easily.       Objective:   Physical Exam  Vitals reviewed. Constitutional: He is oriented to person, place, and time. Vital signs are normal. He appears well-developed and well-nourished.  Non-toxic appearance. He does not have a sickly appearance. He does not appear ill. No distress.  HENT:  Head: Normocephalic and atraumatic. No trismus in  the jaw.  Right Ear: Hearing normal. No lacerations. There is swelling (EAC is mildly swollen). No drainage. No foreign bodies. No mastoid tenderness. Tympanic membrane is injected, erythematous and retracted. Tympanic membrane is not scarred, not perforated and not bulging. Tympanic membrane mobility is normal. No middle ear effusion. No hemotympanum. No decreased hearing is noted.  Left Ear: Hearing, tympanic membrane, external ear and ear canal normal.  Nose: No mucosal edema or rhinorrhea. Right sinus exhibits no maxillary sinus tenderness and no frontal sinus tenderness. Left sinus exhibits no maxillary sinus tenderness and no frontal sinus tenderness.  Mouth/Throat: Mucous membranes are normal. Mucous membranes are not pale, not dry and not cyanotic. No uvula swelling. No oropharyngeal exudate, posterior oropharyngeal edema, posterior oropharyngeal erythema or tonsillar abscesses.  Eyes: Conjunctivae are normal. Right eye exhibits no discharge. Left eye exhibits no discharge. No scleral icterus.  Neck: Normal range of motion. Neck supple. No JVD present. No tracheal deviation present. No thyromegaly present.  Cardiovascular: Normal rate, regular rhythm, normal heart sounds and intact distal pulses.  Exam reveals no gallop and no friction rub.   No murmur heard. Pulmonary/Chest: Effort normal and breath sounds normal. No stridor. No respiratory distress. He has no wheezes. He has no rales. He exhibits no tenderness.  Abdominal: Soft. Bowel sounds are normal. He exhibits no distension and no mass. There is no tenderness. There is no rebound and no guarding.  Musculoskeletal: Normal range of motion. He exhibits no edema and no tenderness.  Lymphadenopathy:       Head (right side): No preauricular, no posterior auricular and no occipital adenopathy present.       Head (left side): No preauricular, no posterior auricular and no occipital adenopathy  present.    He has no cervical adenopathy.    He  has no axillary adenopathy.  Neurological: He is oriented to person, place, and time.  Skin: Skin is warm and dry. No rash noted. He is not diaphoretic. No erythema. No pallor.  Psychiatric: He has a normal mood and affect. His behavior is normal. Judgment and thought content normal.          Assessment & Plan:

## 2012-04-20 NOTE — Assessment & Plan Note (Signed)
Start cortisporin otic susp 

## 2012-04-26 ENCOUNTER — Ambulatory Visit: Payer: BC Managed Care – PPO | Admitting: Internal Medicine

## 2012-04-27 ENCOUNTER — Encounter: Payer: Self-pay | Admitting: Internal Medicine

## 2012-04-27 ENCOUNTER — Ambulatory Visit (INDEPENDENT_AMBULATORY_CARE_PROVIDER_SITE_OTHER): Payer: BC Managed Care – PPO | Admitting: Internal Medicine

## 2012-04-27 VITALS — BP 130/68 | HR 86 | Ht 73.0 in | Wt 190.0 lb

## 2012-04-27 DIAGNOSIS — I2510 Atherosclerotic heart disease of native coronary artery without angina pectoris: Secondary | ICD-10-CM | POA: Diagnosis not present

## 2012-04-27 DIAGNOSIS — R0602 Shortness of breath: Secondary | ICD-10-CM

## 2012-04-27 NOTE — Progress Notes (Addendum)
HPI Patient is a 70 year old with a history of CAD (last intervention was PTCA/stent to RCA for instent restenosis).  Also a history of HTN, HL, tobacco use, COPD.  I saw him in May of last year. Last lipid panel in January LDL was 51, HDL was 56. Since seen denies CP.  Does get a little tired quicker.  MAy be because of breathing   COPD is a little worse.  Still smoking about 1/2 ppd.  But he has slowed down. Has never had CP  Just SOB. No Known Allergies  Current Outpatient Prescriptions  Medication Sig Dispense Refill  . allopurinol (ZYLOPRIM) 300 MG tablet TAKE 1 TABLET BY MOUTH EVERY DAY  30 tablet  4  . aspirin 81 MG tablet Take 81 mg by mouth daily.        . budesonide-formoterol (SYMBICORT) 160-4.5 MCG/ACT inhaler Inhale 2 puffs into the lungs 2 (two) times daily.  1 Inhaler  5  . clopidogrel (PLAVIX) 75 MG tablet TAKE 1 TABLET BY MOUTH EVERY DAY  30 tablet  3  . CRESTOR 40 MG tablet TAKE 1 TABLET BY MOUTH EVERY DAY  30 tablet  5  . glucose blood (ACCU-CHEK COMPACT STRIPS) test strip Once daily, dx 250.00       . KLOR-CON 10 10 MEQ tablet TAKE 1 TABLET DAILY  30 tablet  5  . losartan-hydrochlorothiazide (HYZAAR) 100-25 MG per tablet Take 1 tablet by mouth daily.  30 tablet  12  . methocarbamol (ROBAXIN) 500 MG tablet 1 tablet at bedtime as needed for cramps      . neomycin-colistin-hydrocortisone-thonzonium (CORTISPORIN-TC) 3.02-11-09-0.5 MG/ML otic suspension Place 3 drops into the right ear 4 (four) times daily.  10 mL  0  . tiotropium (SPIRIVA HANDIHALER) 18 MCG inhalation capsule Place 1 capsule (18 mcg total) into inhaler and inhale daily.  30 capsule  6  . VIAGRA 100 MG tablet TAKE AS NEEDED  1 tablet  4    Past Medical History  Diagnosis Date  . CAD, NATIVE VESSEL   . Gout, unspecified   . HYPERLIPIDEMIA   . HYPERTENSION   . PEPTIC ULCER DISEASE   . TOBACCO ABUSE   . Type 2 diabetes mellitus     Past Surgical History  Procedure Date  . Coronary stent placement    s/p    Family History  Problem Relation Age of Onset  . Cancer Sister     Uncertain type  . Cancer Brother     Uncertain type    History   Social History  . Marital Status: Single    Spouse Name: N/A    Number of Children: 2  . 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 Smoking status: Current Everyday Smoker -- 1.0 packs/day for 50 years    Types: Cigarettes  . Smokeless tobacco: Not on file   Comment: currently smoking 5 to 6 cigs daily  . Alcohol Use: Yes     1 beer/day  . Drug Use: Not on file  . Sexually Active: Not on file   Other Topics Concern  . Not on file   Social History Narrative   Widowed 2003    Review of Systems:  All systems reviewed.  They are negative to the above problem except as previously stated.  Vital Signs: BP 130/68  Pulse 86  Ht 6\' 1"  (1.854 m)  Wt 190 lb (86.183 kg)  BMI 25.07 kg/m2  Physical Exam Patient is in NAD  Coughing. HEENT:  Normocephalic, atraumatic. EOMI, PERRLA.  Neck: JVP is normal. No thyromegaly. No bruits.  Lungs: Decreased airflow.  Wheezes.  No rales.  Heart: Regular rate and rhythm. Normal S1, S2. No S3.   No significant murmurs. PMI not displaced.  Abdomen:  Supple, nontender. Normal bowel sounds. No masses. No hepatomegaly.  Extremities:   Good distal pulses throughout. No lower extremity edema.  Musculoskeletal :moving all extremities.  Neuro:   alert and oriented x3.  CN II-XII grossly intact.  EKG:  SR  86.    Assessment and Plan:  1.  CAD.  Patient has never had CP.  Last cath in 2006 he had 60 to 70% LAD lesion; 60% LCx and 70% instent restensosi of RCA.  Had intervention.  Cath prompted by abnormal myoview. HE has never had CP  Just SOB.  He now notes that he is more SOB  More fatigued.  Exam does show decreased airflow and this may explain all or part of his fatigue. His sats did go down to 89% with walking I would recomm a dobutamine  myoview.  2/  COPD  He needs to stop smoking.  I will not prescribe O2 today.  Will f/u with [facility]  3.  HL  Keep on Crestor.   06/04/12.  Patient had [facility] which showed inferior scar with mild periinfarct ischemia.  LVEF 50% with inf hypokinesis. Echo showed normal LVEF and normal wall motion.  Question if prob with RCA   He had known COPD but would recomm L heart cath to rule out progressive CAD.  Has had progressive dyspnea.  No history of CP in past, even remote.  [name]

## 2012-04-27 NOTE — Patient Instructions (Signed)
Your physician has requested that you have a dobutamine myoview. For furth information please visit [email or web address] Please follow instruction sheet, as given.  Your physician wants you to follow-up in:12 months You will receive a reminder letter in the mail two months in advance. If you don't receive a letter, please call our office to schedule the follow-up appointment.

## 2012-05-03 ENCOUNTER — Ambulatory Visit (HOSPITAL_COMMUNITY): Payer: BC Managed Care – PPO | Attending: Cardiology | Admitting: Radiology

## 2012-05-03 VITALS — BP 131/80 | Ht 73.0 in | Wt 190.0 lb

## 2012-05-03 DIAGNOSIS — R0602 Shortness of breath: Secondary | ICD-10-CM | POA: Diagnosis not present

## 2012-05-03 DIAGNOSIS — I2510 Atherosclerotic heart disease of native coronary artery without angina pectoris: Secondary | ICD-10-CM | POA: Diagnosis not present

## 2012-05-03 MED ORDER — TECHNETIUM TC 99M TETROFOSMIN IV KIT
33.0000 | PACK | Freq: Once | INTRAVENOUS | Status: AC | PRN
  Administered 2012-05-03: 33 via INTRAVENOUS

## 2012-05-03 MED ORDER — TECHNETIUM TC 99M TETROFOSMIN IV KIT
11.0000 | PACK | Freq: Once | INTRAVENOUS | Status: AC | PRN
  Administered 2012-05-03: 11 via INTRAVENOUS

## 2012-05-03 MED ORDER — REGADENOSON 0.4 MG/5ML IV SOLN
0.4000 mg | Freq: Once | INTRAVENOUS | Status: AC
  Administered 2012-05-03: 0.4 mg via INTRAVENOUS

## 2012-05-03 NOTE — Progress Notes (Signed)
[facility] SITE 3 NUCLEAR MED [address] [place] [address] [phone]  Cardiology Nuclear Med Cahlil Sattar is a 71 y.o. male     MRN : [id number]     DOB: 28-Jan-1941  Procedure Date: 05/03/2012  Nuclear Med Background Indication for Stress Test:  Evaluation for Ischemia, Stent Patency and PTCA Patency History:  2006 MPS- inferior wall ischemia, 2006-PTCA, Stent- RCA EF 55% Cardiac Risk Factors: Hypertension, Lipids, NIDDM and Smoker  Symptoms:  DOE, Fatigue and Rapid HR   Nuclear Pre-Procedure Caffeine/Decaff Intake:  None NPO After: 12:00am   Lungs:  Mild expiratory wheezes, Inhaler used prior to stress test O2 Sat: 95% on room air. IV 0.9% NS with Angio Cath:  20g  IV Site: R Antecubital  IV Started by:  Stanton Kidney, EMT-P  Chest Size (in):  44 Cup Size: n/a  Height: 6\' 1"  (1.854 m)  Weight:  190 lb (86.183 kg)  BMI:  Body mass index is 25.07 kg/(m^2). Tech Comments:  NA    Nuclear Med Study 1 or 2 day study: 1 day  Stress Test Type:  Lexiscan  Reading MD: Willa Rough, MD  Order Authorizing Provider:  [name], MD  Resting Radionuclide: Technetium 20m Tetrofosmin  Resting Radionuclide Dose: 10.7 mCi   Stress Radionuclide:  Technetium 73m Tetrofosmin  Stress Radionuclide Dose: 31.9 mCi           Stress Protocol Rest HR: 65 Stress HR: 85  Rest BP: 131/80 Stress BP: 135/79  Exercise Time (min): n/a METS: n/a   Predicted Max HR: 152 bpm % Max HR: 55.92 bpm Rate Pressure Product: 21308   Dose of Adenosine (mg):  n/a Dose of Lexiscan: 0.4 mg  Dose of Atropine (mg): n/a Dose of Dobutamine: n/a mcg/kg/min (at max HR)  Stress Test Technologist: Bonnita Levan, RN  Nuclear Technologist:  Domenic Polite, CNMT     Rest Procedure:  Myocardial perfusion imaging was performed at rest 45 minutes following the intravenous administration of Technetium 74m Tetrofosmin. Rest ECG: NSR - Normal EKG  Stress Procedure:  The patient received IV  Lexiscan 0.4 mg over 15-seconds.  Technetium 13m Tetrofosmin injected at 30-seconds.  There were no significant changes with Lexiscan.  Quantitative spect images were obtained after a 45 minute delay. Stress ECG: No significant change from baseline ECG  QPS Raw Data Images:  Patient motion noted; appropriate software correction applied. Stress Images:  There is moderate decrease activity and a moderate sized area affecting the base, mid, and apical inferior segments. Rest Images:  Images are similar to the stress study with slightly less decreased activity Subtraction (SDS):  Slight ischemia Transient Ischemic Dilatation (Normal <1.22):  0.97 Lung/Heart Ratio (Normal <0.45):  0.30  Quantitative Gated Spect Images QGS EDV:  116 ml QGS ESV:  58 ml  Impression Exercise Capacity:  Lexiscan with no exercise. BP Response:  Normal blood pressure Clinical Symptoms:  feels weird ECG Impression:  No significant ST segment change suggestive of ischemia. Comparison with Prior Nuclear Study:   By history the patient had a nuclear study in 2006. This study is not available for review at this time.  Overall Impression:  There is a moderate scar in the inferior wall and inferior septum. There is mild peri-infarct ischemia. There is hypokinesis in these areas.  LV Ejection Fraction: 50%.  LV Wall Motion:  Hypokinesis of the inferior wall and inferior septum.  Willa Rough, MD

## 2012-05-08 ENCOUNTER — Other Ambulatory Visit: Payer: Self-pay | Admitting: *Deleted

## 2012-05-08 DIAGNOSIS — R0602 Shortness of breath: Secondary | ICD-10-CM

## 2012-05-15 ENCOUNTER — Ambulatory Visit (HOSPITAL_COMMUNITY): Payer: BC Managed Care – PPO | Attending: Cardiovascular Disease

## 2012-05-15 DIAGNOSIS — J449 Chronic obstructive pulmonary disease, unspecified: Secondary | ICD-10-CM | POA: Insufficient documentation

## 2012-05-15 DIAGNOSIS — R0609 Other forms of dyspnea: Secondary | ICD-10-CM | POA: Diagnosis not present

## 2012-05-15 DIAGNOSIS — R0602 Shortness of breath: Secondary | ICD-10-CM

## 2012-05-15 DIAGNOSIS — J4489 Other specified chronic obstructive pulmonary disease: Secondary | ICD-10-CM | POA: Insufficient documentation

## 2012-05-15 DIAGNOSIS — R0989 Other specified symptoms and signs involving the circulatory and respiratory systems: Secondary | ICD-10-CM | POA: Diagnosis not present

## 2012-05-15 DIAGNOSIS — R5383 Other fatigue: Secondary | ICD-10-CM | POA: Insufficient documentation

## 2012-05-15 DIAGNOSIS — E119 Type 2 diabetes mellitus without complications: Secondary | ICD-10-CM | POA: Insufficient documentation

## 2012-05-15 DIAGNOSIS — F17200 Nicotine dependence, unspecified, uncomplicated: Secondary | ICD-10-CM | POA: Insufficient documentation

## 2012-05-15 DIAGNOSIS — R5381 Other malaise: Secondary | ICD-10-CM | POA: Insufficient documentation

## 2012-05-15 DIAGNOSIS — I2510 Atherosclerotic heart disease of native coronary artery without angina pectoris: Secondary | ICD-10-CM | POA: Insufficient documentation

## 2012-05-15 DIAGNOSIS — E785 Hyperlipidemia, unspecified: Secondary | ICD-10-CM | POA: Insufficient documentation

## 2012-05-15 DIAGNOSIS — I10 Essential (primary) hypertension: Secondary | ICD-10-CM | POA: Insufficient documentation

## 2012-05-15 NOTE — Progress Notes (Signed)
Echocardiogram performed.  

## 2012-05-17 ENCOUNTER — Telehealth: Payer: Self-pay | Admitting: Internal Medicine

## 2012-05-17 NOTE — Telephone Encounter (Signed)
Pt had test done and would like results

## 2012-05-17 NOTE — Telephone Encounter (Signed)
I spoke with the pt and gave him the preliminary results on his echocardiogram.  I made him aware that Dr Tenny Craw has not reviewed this test at this time but his EF was normal and wall motion was normal.

## 2012-06-01 ENCOUNTER — Encounter: Payer: Self-pay | Admitting: *Deleted

## 2012-06-01 ENCOUNTER — Telehealth: Payer: Self-pay | Admitting: Internal Medicine

## 2012-06-01 DIAGNOSIS — R0602 Shortness of breath: Secondary | ICD-10-CM

## 2012-06-01 DIAGNOSIS — I2510 Atherosclerotic heart disease of native coronary artery without angina pectoris: Secondary | ICD-10-CM

## 2012-06-01 DIAGNOSIS — Z01810 Encounter for preprocedural cardiovascular examination: Secondary | ICD-10-CM

## 2012-06-01 NOTE — Telephone Encounter (Signed)
New problem:  Test results.  

## 2012-06-01 NOTE — Telephone Encounter (Signed)
LHC scheduled for  Friday 06/08/12 with Dr Eden Emms @ 9:30 am labs 06/05/12 for review/ orders placed. Dr Tenny Craw to write [facility] cath orders.

## 2012-06-04 ENCOUNTER — Other Ambulatory Visit: Payer: Self-pay | Admitting: *Deleted

## 2012-06-04 MED ORDER — LOSARTAN POTASSIUM-HCTZ 100-25 MG PO TABS: 1.0000 | ORAL_TABLET | Freq: Every day | ORAL | Status: DC

## 2012-06-04 NOTE — Telephone Encounter (Signed)
Orders done.  Addended HP

## 2012-06-05 ENCOUNTER — Other Ambulatory Visit (INDEPENDENT_AMBULATORY_CARE_PROVIDER_SITE_OTHER): Payer: BC Managed Care – PPO

## 2012-06-05 DIAGNOSIS — Z01810 Encounter for preprocedural cardiovascular examination: Secondary | ICD-10-CM

## 2012-06-05 DIAGNOSIS — I2510 Atherosclerotic heart disease of native coronary artery without angina pectoris: Secondary | ICD-10-CM | POA: Diagnosis not present

## 2012-06-05 DIAGNOSIS — R0602 Shortness of breath: Secondary | ICD-10-CM

## 2012-06-05 LAB — BASIC METABOLIC PANEL
BUN: 15 mg/dL (ref 6–23)
Calcium: 8.9 mg/dL (ref 8.4–10.5)
Creatinine, Ser: 0.9 mg/dL (ref 0.4–1.5)
GFR: 110.58 mL/min (ref >60.00)

## 2012-06-05 LAB — CBC WITH DIFFERENTIAL/PLATELET
Basophils Relative: 0.7 % (ref 0.0–3.0)
Eosinophils Relative: 2.2 % (ref 0.0–5.0)
Lymphocytes Relative: 30.4 % (ref 12.0–46.0)
MCV: 98.5 fl (ref 78.0–100.0)
Monocytes Absolute: 1.5 10*3/uL — ABNORMAL HIGH (ref 0.1–1.0)
Neutrophils Relative %: 53.2 % (ref 43.0–77.0)
Platelets: 264 10*3/uL (ref 150.0–400.0)
RBC: 4.23 Mil/uL (ref 4.22–5.81)
WBC: 11.1 10*3/uL — ABNORMAL HIGH (ref 4.5–10.5)

## 2012-06-05 LAB — PROTIME-INR
INR: 0.9 ratio (ref 0.8–1.0)
Prothrombin Time: 10.3 s (ref 10.2–12.4)

## 2012-06-06 ENCOUNTER — Telehealth: Payer: Self-pay | Admitting: Cardiovascular Disease

## 2012-06-06 NOTE — Telephone Encounter (Signed)
LMTCB ./CY 

## 2012-06-06 NOTE — Telephone Encounter (Signed)
New msg Pt was concerned about procedure he is to have on Friday. Please call

## 2012-06-07 ENCOUNTER — Telehealth: Payer: Self-pay | Admitting: *Deleted

## 2012-06-07 NOTE — Telephone Encounter (Signed)
Fu call Patient wants to know will he stay overnight for his procedure. He is anxious

## 2012-06-07 NOTE — Telephone Encounter (Signed)
Spoke with pt and advised he would go home unless there was an intervention--pt states he would rather stay in hosp overnite as he has no one to care for him--advised he should make arrangements for someone to at his home in the event he would need help--pt agrees to find someone

## 2012-06-08 ENCOUNTER — Inpatient Hospital Stay (HOSPITAL_BASED_OUTPATIENT_CLINIC_OR_DEPARTMENT_OTHER)
Admission: RE | Admit: 2012-06-08 | Discharge: 2012-06-08 | Disposition: A | Discharge status: Home / Self Care | Payer: BC Managed Care – PPO | Source: Ambulatory Visit | Attending: Cardiovascular Disease | Admitting: Cardiovascular Disease

## 2012-06-08 ENCOUNTER — Encounter (HOSPITAL_BASED_OUTPATIENT_CLINIC_OR_DEPARTMENT_OTHER)
Admission: RE | Disposition: A | Discharge status: Home / Self Care | Payer: Self-pay | Source: Ambulatory Visit | Attending: Cardiovascular Disease

## 2012-06-08 DIAGNOSIS — J4489 Other specified chronic obstructive pulmonary disease: Secondary | ICD-10-CM | POA: Insufficient documentation

## 2012-06-08 DIAGNOSIS — I2510 Atherosclerotic heart disease of native coronary artery without angina pectoris: Secondary | ICD-10-CM | POA: Insufficient documentation

## 2012-06-08 DIAGNOSIS — J449 Chronic obstructive pulmonary disease, unspecified: Secondary | ICD-10-CM | POA: Insufficient documentation

## 2012-06-08 DIAGNOSIS — E785 Hyperlipidemia, unspecified: Secondary | ICD-10-CM | POA: Insufficient documentation

## 2012-06-08 DIAGNOSIS — I10 Essential (primary) hypertension: Secondary | ICD-10-CM | POA: Insufficient documentation

## 2012-06-08 DIAGNOSIS — Z9861 Coronary angioplasty status: Secondary | ICD-10-CM | POA: Insufficient documentation

## 2012-06-08 DIAGNOSIS — R9439 Abnormal result of other cardiovascular function study: Secondary | ICD-10-CM | POA: Insufficient documentation

## 2012-06-08 DIAGNOSIS — F17200 Nicotine dependence, unspecified, uncomplicated: Secondary | ICD-10-CM | POA: Insufficient documentation

## 2012-06-08 SURGERY — JV LEFT HEART CATHETERIZATION WITH CORONARY ANGIOGRAM
Anesthesia: Moderate Sedation

## 2012-06-08 MED ORDER — ONDANSETRON HCL 4 MG/2ML IJ SOLN: 4.0000 mg | Freq: Four times a day (QID) | INTRAMUSCULAR | Status: DC | PRN

## 2012-06-08 MED ORDER — SODIUM CHLORIDE 0.9 % IV SOLN: 250.0000 mL | INTRAVENOUS | Status: DC

## 2012-06-08 MED ORDER — SODIUM CHLORIDE 0.9 % IV SOLN: INTRAVENOUS | Status: DC

## 2012-06-08 MED ORDER — ASPIRIN EC 325 MG PO TBEC: 325.0000 mg | DELAYED_RELEASE_TABLET | Freq: Every day | ORAL | Status: DC

## 2012-06-08 MED ORDER — SODIUM CHLORIDE 0.9 % IJ SOLN: 3.0000 mL | Freq: Two times a day (BID) | INTRAMUSCULAR | Status: DC

## 2012-06-08 MED ORDER — ASPIRIN 81 MG PO CHEW: 324.0000 mg | CHEWABLE_TABLET | ORAL | Status: DC

## 2012-06-08 MED ORDER — SODIUM CHLORIDE 0.9 % IJ SOLN: 3.0000 mL | INTRAMUSCULAR | Status: DC | PRN

## 2012-06-08 MED ORDER — SODIUM CHLORIDE 0.45 % IV SOLN: INTRAVENOUS | Status: AC

## 2012-06-08 MED ORDER — SODIUM CHLORIDE 0.9 % IV SOLN: 250.0000 mL | INTRAVENOUS | Status: DC | PRN

## 2012-06-08 MED ORDER — ACETAMINOPHEN 325 MG PO TABS: 650.0000 mg | ORAL_TABLET | ORAL | Status: DC | PRN

## 2012-06-08 MED ORDER — DIAZEPAM 2 MG PO TABS: 2.0000 mg | ORAL_TABLET | Freq: Two times a day (BID) | ORAL | Status: DC | PRN

## 2012-06-08 MED ORDER — OXYCODONE-ACETAMINOPHEN 5-325 MG PO TABS: 1.0000 | ORAL_TABLET | ORAL | Status: DC | PRN

## 2012-06-08 NOTE — OR Nursing (Signed)
Dr Nishan at bedside to discuss results and treatment plan with pt and family 

## 2012-06-08 NOTE — H&P (Signed)
Ryan Pates, MD 06/04/2012 1:44 PM Addendum  HPI  Patient is a 71 year old with a history of CAD (last intervention was PTCA/stent to RCA for instent restenosis). Also a history of HTN, HL, tobacco use, COPD. I saw him in May of last year.  Last lipid panel in January LDL was 51, HDL was 56.  Since seen denies CP. Does get a little tired quicker. MAy be because of breathing COPD is a little worse. Still smoking about 1/2 ppd. But he has slowed down.  Has never had CP Just SOB.  No Known Allergies  Current Outpatient Prescriptions   Medication  Sig  Dispense  Refill   .  allopurinol (ZYLOPRIM) 300 MG tablet  TAKE 1 TABLET BY MOUTH EVERY DAY  30 tablet  4   .  aspirin 81 MG tablet  Take 81 mg by mouth daily.     .  budesonide-formoterol (SYMBICORT) 160-4.5 MCG/ACT inhaler  Inhale 2 puffs into the lungs 2 (two) times daily.  1 Inhaler  5   .  clopidogrel (PLAVIX) 75 MG tablet  TAKE 1 TABLET BY MOUTH EVERY DAY  30 tablet  3   .  CRESTOR 40 MG tablet  TAKE 1 TABLET BY MOUTH EVERY DAY  30 tablet  5   .  glucose blood (ACCU-CHEK COMPACT STRIPS) test strip  Once daily, dx 250.00     .  KLOR-CON 10 10 MEQ tablet  TAKE 1 TABLET DAILY  30 tablet  5   .  losartan-hydrochlorothiazide (HYZAAR) 100-25 MG per tablet  Take 1 tablet by mouth daily.  30 tablet  12   .  methocarbamol (ROBAXIN) 500 MG tablet  1 tablet at bedtime as needed for cramps     .  neomycin-colistin-hydrocortisone-thonzonium (CORTISPORIN-TC) 3.02-11-09-0.5 MG/ML otic suspension  Place 3 drops into the right ear 4 (four) times daily.  10 mL  0   .  tiotropium (SPIRIVA HANDIHALER) 18 MCG inhalation capsule  Place 1 capsule (18 mcg total) into inhaler and inhale daily.  30 capsule  6   .  VIAGRA 100 MG tablet  TAKE AS NEEDED  1 tablet  4    Past Medical History   Diagnosis  Date   .  CAD, NATIVE VESSEL    .  Gout, unspecified    .  HYPERLIPIDEMIA    .  HYPERTENSION    .  PEPTIC ULCER DISEASE    .  TOBACCO ABUSE    .  Type 2 diabetes  mellitus     Past Surgical History   Procedure  Date   .  Coronary stent placement      s/p    Family History   Problem  Relation  Age of Onset   .  Cancer  Sister       Uncertain type    .  Cancer  Brother       Uncertain type    History    Social History   .  Marital Status:  Single     Spouse Name:  N/A     Number of Children:  2   .  Years of Education:  N/A    Occupational History   .  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  Smoking status:  Current Everyday Smoker -- 1.0 packs/day for 50 years     Types:  Cigarettes   .  Smokeless tobacco:  Not on file  Comment: currently smoking 5 to 6 cigs daily    .  Alcohol Use:  Yes      1 beer/day   .  Drug Use:  Not on file   .  Sexually Active:  Not on file    Other Topics  Concern   .  Not on file    Social History Narrative    Widowed 2003    Review of Systems: All systems reviewed. They are negative to the above problem except as previously stated.  Vital Signs:  BP 130/68  Pulse 86  Ht 6\' 1"  (1.854 m)  Wt 190 lb (86.183 kg)  BMI 25.07 kg/m2  Physical Exam  Patient is in NAD Coughing.  HEENT: Normocephalic, atraumatic. EOMI, PERRLA.  Neck: JVP is normal. No thyromegaly. No bruits.  Lungs: Decreased airflow. Wheezes. No rales.  Heart: Regular rate and rhythm. Normal S1, S2. No S3. No significant murmurs. PMI not displaced.  Abdomen: Supple, nontender. Normal bowel sounds. No masses. No hepatomegaly.  Extremities: Good distal pulses throughout. No lower extremity edema.  Musculoskeletal :moving all extremities.  Neuro: alert and oriented x3. CN II-XII grossly intact.  EKG: SR 86.  Assessment and Plan:  1. CAD. Patient has never had CP. Last cath in 2006 he had 60 to 70% LAD lesion; 60% LCx and 70% instent restensosi of RCA. Had intervention. Cath prompted by abnormal myoview.  HE has never had CP Just SOB. He now notes that he is more SOB More fatigued. Exam does show decreased airflow  and this may explain all or part of his fatigue.  His sats did go down to 89% with walking  I would recomm a dobutamine myoview.  2/ COPD He needs to stop smoking. I will not prescribe O2 today. Will f/u with [facility]  3. HL Keep on Crestor.  06/04/12.  Patient had [facility] which showed inferior scar with mild periinfarct ischemia. LVEF 50% with inf hypokinesis.  Echo showed normal LVEF and normal wall motion.  Question if prob with RCA  He had known COPD but would recomm L heart cath to rule out progressive CAD. Has had progressive dyspnea. No history of CP in past, even remote.     Patient examned chart reviewed  Set up for cath by Dr Tenny Craw.  Myovue abnormal done 05/03/12   Overall Impression: There is a moderate scar in the inferior wall and inferior septum. There is mild peri-infarct ischemia. There is hypokinesis in these areas.  LV Ejection Fraction: 50%. LV Wall Motion: Hypokinesis of the inferior wall and inferior septum.  Risks discussed willing to proceed  Ryan Contreras 9:15 AM 06/08/2012

## 2012-06-08 NOTE — CV Procedure (Addendum)
      Catheterization   Indication: CAD with distant history of stent Abnormal myovue with inferior infarct and mild perinfarct ischemia  Procedure: After informed consent and clinical "time out" the right groin was prepped and draped in a sterile fashion.  A 5Fr sheath was placed in the right femoral artery using seldinger technique and local lidocaine.  Standard JL4, JR4 and angled pigtail catheters were used to engage the coronary arteries.  Coronary arteries were visualized in orthogonal views using caudal and cranial angulation.  RAO ventriculography was done using 25* cc of contrast.    Medications:   Versed: 2 mg's  Fentanyl: 0 ug's  Coronary Arteries: Right dominant with no anomalies  LM: Normal  LAD: normal proximally, mid vessel has a hypodense area with moderate 60% stenosis.  Difficult to isolate from D1 take off.  Looks similar to last cath in 2006 Distal vessel is normal    D1: 40% ostial     Circumflex: 40% mid vessel disease   OM1: large branching artery mid circumflex lesion involves take off of OM 40%    RCA: Widely patent stent in proximal vessel  40% mid vessel disease normal distal vessel   PDA: normal  PLA: normal  Ventriculography: EF: 55 %, minimsl inferobasal hypokinesis  Hemodynamics:  Aortic Pressure: 136 69  mmHg  LV Pressure: 132 17  mmHg  Impression:  Will need to review films with interventionalist.  None available.  LAD lesion looks same as 2006.  Patient not having chest pain and myovue not ischemic In anterior distribution.  Likely favor medical Rx with D/C smoking.    Addendum:  Films reviewed with Dr Gerarda Gunther with medical Rx does not think LAD is flow limiting  Charlton Haws 06/08/2012 10:00 AM

## 2012-06-08 NOTE — OR Nursing (Signed)
Discharge instructions reviewed and signed, pt stated understanding, ambulated in hall without difficulty, site level 0, transported to sister's car via wheelchair 

## 2012-06-08 NOTE — OR Nursing (Signed)
Tegaderm dressing applied, site level 0, bedrest begins at 1015 

## 2012-06-21 ENCOUNTER — Encounter: Payer: Self-pay | Admitting: Internal Medicine

## 2012-06-21 ENCOUNTER — Ambulatory Visit (INDEPENDENT_AMBULATORY_CARE_PROVIDER_SITE_OTHER): Payer: BC Managed Care – PPO | Admitting: Internal Medicine

## 2012-06-21 VITALS — BP 138/69 | HR 98 | Ht 73.0 in | Wt 193.0 lb

## 2012-06-21 DIAGNOSIS — I2510 Atherosclerotic heart disease of native coronary artery without angina pectoris: Secondary | ICD-10-CM | POA: Diagnosis not present

## 2012-06-21 NOTE — Progress Notes (Signed)
HPI Patient is a 71 year old with a history of CAD (last intervention was PTCA/stent to RCA for instent restenosis). Also a history of HTN, HL, tobacco use, COPD. I Last lipid panel in January LDL was 51, HDL was 56.   I saw the patient earlier this summer.  He was complaining of worsening SOB.  With his Hx I recomm a myoview scan to r/o ischemia.  This showed inferior scar with mild periinfarct ischemia.  Echo showed normal walll motion.  With his hx of interventions to the RCA I recomm a L heart cath to define, r/o worsening in RCA. No Known Allergies  Current Outpatient Prescriptions  Medication Sig Dispense Refill  . allopurinol (ZYLOPRIM) 300 MG tablet TAKE 1 TABLET BY MOUTH EVERY DAY  30 tablet  4  . aspirin 81 MG tablet Take 81 mg by mouth daily.        . budesonide-formoterol (SYMBICORT) 160-4.5 MCG/ACT inhaler Inhale 2 puffs into the lungs 2 (two) times daily.  1 Inhaler  5  . clopidogrel (PLAVIX) 75 MG tablet TAKE 1 TABLET BY MOUTH EVERY DAY  30 tablet  3  . CRESTOR 40 MG tablet TAKE 1 TABLET BY MOUTH EVERY DAY  30 tablet  5  . glucose blood (ACCU-CHEK COMPACT STRIPS) test strip Once daily, dx 250.00       . KLOR-CON 10 10 MEQ tablet TAKE 1 TABLET DAILY  30 tablet  5  . losartan-hydrochlorothiazide (HYZAAR) 100-25 MG per tablet Take 1 tablet by mouth daily.  30 tablet  12  . methocarbamol (ROBAXIN) 500 MG tablet 1 tablet at bedtime as needed for cramps      . VIAGRA 100 MG tablet TAKE AS NEEDED  1 tablet  4  . tiotropium (SPIRIVA HANDIHALER) 18 MCG inhalation capsule Place 1 capsule (18 mcg total) into inhaler and inhale daily.  30 capsule  6    Past Medical History  Diagnosis Date  . CAD, NATIVE VESSEL   . Gout, unspecified   . HYPERLIPIDEMIA   . HYPERTENSION   . PEPTIC ULCER DISEASE   . TOBACCO ABUSE   . Type 2 diabetes mellitus     Past Surgical History  Procedure Date  . Coronary stent placement     s/p    Family History  Problem Relation Age of Onset  . Cancer  Sister     Uncertain type  . Cancer Brother     Uncertain type    History   Social History  . Marital Status: Single    Spouse Name: N/A    Number of Children: 2  . 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 Smoking status: Current Everyday Smoker -- 1.0 packs/day for 50 years    Types: Cigarettes  . Smokeless tobacco: Not on file   Comment: currently smoking 5 to 6 cigs daily  . Alcohol Use: Yes     1 beer/day  . Drug Use: Not on file  . Sexually Active: Not on file   Other Topics Concern  . Not on file   Social History Narrative   Widowed 2003    Review of Systems:  All systems reviewed.  They are negative to the above problem except as previously stated.  Vital Signs: BP 138/69  Pulse 98  Ht 6\' 1"  (1.854 m)  Wt 193 lb (87.544 kg)  BMI 25.46 kg/m2  SpO2 91%  Physical Exam  Patient is in NAD HEENT:  Normocephalic, atraumatic. EOMI  Neck: JVP is normal.  Lungs: Decreased airflow.  No Rales. Heart: Regular rate and rhythm. Normal S1, S2. No S3.   No significant murmurs. PMI not displaced.  Abdomen:  Supple, nontender. Normal bowel sounds. No masses. No hepatomegaly.  Extremities:   Good distal pulses throughout. No lower extremity edema.  Musculoskeletal :moving all extremities.  Neuro:   alert and oriented x3.  CN II-XII grossly intact.  EKG:  SInus rhythm 85 bpm. Assessment and Plan:  1.  CAD>  Recent cath shows no severe stenoses.  Symptoms are most likely related to COPD.  Would keep on Plavix with history of instent restenosis even with recent findings. Keep on same regimen.  COunselled again on smoking cessation.  2.  HTN  BP is adequate.  Keep on same regimen  3.  HL  LDL is 51.  HDL 57.  Keep on same regimen.

## 2012-06-21 NOTE — Patient Instructions (Signed)
Your physician wants you to follow-up in: January 2014 You will receive a reminder letter in the mail two months in advance. If you don't receive a letter, please call our office to schedule the follow-up appointment.

## 2012-07-17 ENCOUNTER — Other Ambulatory Visit: Payer: Self-pay | Admitting: Internal Medicine

## 2012-07-21 ENCOUNTER — Ambulatory Visit (INDEPENDENT_AMBULATORY_CARE_PROVIDER_SITE_OTHER): Payer: BC Managed Care – PPO | Admitting: Family Medicine

## 2012-07-21 ENCOUNTER — Encounter: Payer: Self-pay | Admitting: Family Medicine

## 2012-07-21 VITALS — BP 122/62 | HR 81 | Temp 98.1°F | Ht 73.0 in | Wt 192.0 lb

## 2012-07-21 DIAGNOSIS — T148XXA Other injury of unspecified body region, initial encounter: Secondary | ICD-10-CM | POA: Diagnosis not present

## 2012-07-21 NOTE — Assessment & Plan Note (Signed)
New.  Pt on both ASA and Plavix.  No evidence of hematoma or skin lesion.  Likely due to recent outdoor work despite pt's inability to remember an injury.  Reviewed supportive care and red flags that should prompt return.  Pt expressed understanding and is in agreement w/ plan.

## 2012-07-21 NOTE — Patient Instructions (Addendum)
This appears to be a large bruise and not a bite It is not infected Apply heat or ice (whichever feels better) to help the discoloration go away Call with any questions or concerns- particularly if the swelling or pain worsens Hang in there!!!

## 2012-07-21 NOTE — Progress Notes (Signed)
  Subjective:    Patient ID: Ryan Contreras, male    DOB: Jul 23, 1941, 71 y.o.   MRN: [id number]  HPI Bite on arm- pt was cutting shrubs on Thursday, discovered Friday evening on L upper arm.  Not painful.  No known injury to arm.  Not swollen.   Review of Systems For ROS see HPI     Objective:   Physical Exam  Vitals reviewed. Constitutional: He appears well-developed and well-nourished. No distress.  Skin: Skin is warm and dry. No rash noted. No erythema.       Pt w/ large bruise w/out hematoma on L upper arm Corresponding smaller bruise on R upper arm No evidence of bite at either site          Assessment & Plan:

## 2012-08-01 ENCOUNTER — Telehealth: Payer: Self-pay | Admitting: Internal Medicine

## 2012-08-01 DIAGNOSIS — N401 Enlarged prostate with lower urinary tract symptoms: Secondary | ICD-10-CM | POA: Diagnosis not present

## 2012-08-01 DIAGNOSIS — R972 Elevated prostate specific antigen [PSA]: Secondary | ICD-10-CM | POA: Diagnosis not present

## 2012-08-01 NOTE — Telephone Encounter (Signed)
New problem:  Per Fannie Knee patient on ASA &  Plavix  , need to do prostate ultrasound  &  biopsy. Please advise on medication when to stop / start. Also patient will be on Levaquin 500 mg one time day. Starting the day before the procedure . Would this be O.K with Dr. Tenny Craw.

## 2012-08-01 NOTE — Telephone Encounter (Signed)
Ryan Contreras is scheduled for a ultrasound & biopsy of the prostate at [facility] on 09/17/12.  They want to know if he can hold his ASA (81mg ) and Plavix?  If so, for how long before and how long after?  They prefer that he hold the ASA 5days prior and the Plavix 7 days prior if that is ok.  They also will have him on Levaquin for 3 days (day before, day of procedure and day after).

## 2012-08-08 NOTE — Telephone Encounter (Signed)
OK to hold ASA and Plavix prior to procedure as described.  Would resume after.

## 2012-08-09 NOTE — Telephone Encounter (Signed)
[facility]ed [facility] and left message that Dr.Ross has approved this patient to hold ASA 5 days and Plavix 7 days prior to prostate biopsy and to resume medications after procedure.

## 2012-08-14 ENCOUNTER — Ambulatory Visit (INDEPENDENT_AMBULATORY_CARE_PROVIDER_SITE_OTHER)
Admission: RE | Admit: 2012-08-14 | Discharge: 2012-08-14 | Disposition: A | Discharge status: Home / Self Care | Payer: BC Managed Care – PPO | Source: Ambulatory Visit | Attending: Endocrinology | Admitting: Endocrinology

## 2012-08-14 ENCOUNTER — Ambulatory Visit (INDEPENDENT_AMBULATORY_CARE_PROVIDER_SITE_OTHER): Payer: BC Managed Care – PPO | Admitting: Endocrinology

## 2012-08-14 ENCOUNTER — Encounter: Payer: Self-pay | Admitting: Endocrinology

## 2012-08-14 ENCOUNTER — Other Ambulatory Visit (INDEPENDENT_AMBULATORY_CARE_PROVIDER_SITE_OTHER): Payer: BC Managed Care – PPO

## 2012-08-14 VITALS — BP 122/68 | HR 82 | Temp 97.9°F | Resp 16 | Wt 190.5 lb

## 2012-08-14 DIAGNOSIS — E119 Type 2 diabetes mellitus without complications: Secondary | ICD-10-CM

## 2012-08-14 DIAGNOSIS — R05 Cough: Secondary | ICD-10-CM

## 2012-08-14 DIAGNOSIS — J4489 Other specified chronic obstructive pulmonary disease: Secondary | ICD-10-CM

## 2012-08-14 DIAGNOSIS — J209 Acute bronchitis, unspecified: Secondary | ICD-10-CM

## 2012-08-14 DIAGNOSIS — J449 Chronic obstructive pulmonary disease, unspecified: Secondary | ICD-10-CM

## 2012-08-14 LAB — HEMOGLOBIN A1C: Hgb A1c MFr Bld: 6.3 % (ref 4.6–6.5)

## 2012-08-14 MED ORDER — DOXYCYCLINE HYCLATE 100 MG PO TABS: 100.0000 mg | ORAL_TABLET | Freq: Two times a day (BID) | ORAL | Status: AC

## 2012-08-14 NOTE — Patient Instructions (Addendum)
A chest-x-ray, and a blood test are being requested for you today.  You will receive a letter with results. i have sent a prescription to your pharmacy, for an antibiotic pill. Please resume your inhalers.  Here are some samples.   Please come in for a regular physical after 12/15/12.

## 2012-08-14 NOTE — Progress Notes (Signed)
Subjective:    Patient ID: Ryan Contreras, male    DOB: June 28, 1941, 71 y.o.   MRN: [id number]  HPI Pt states 1 week of moderate prod-quality cough in the chest, and assoc rhinorrhea.  He has a slight right earache.   He ran out of his mdi's Past Medical History  Diagnosis Date  . CAD, NATIVE VESSEL   . Gout, unspecified   . HYPERLIPIDEMIA   . HYPERTENSION   . PEPTIC ULCER DISEASE   . TOBACCO ABUSE   . Type 2 diabetes mellitus     Past Surgical History  Procedure Date  . Coronary stent placement     s/p    History   Social History  . Marital Status: Single    Spouse Name: N/A    Number of Children: 2  . 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 Smoking status: Current Everyday Smoker -- 1.0 packs/day for 50 years    Types: Cigarettes  . Smokeless tobacco: Not on file   Comment: currently smoking 5 to 6 cigs daily  . Alcohol Use: Yes     1 beer/day  . Drug Use: Not on file  . Sexually Active: Not on file   Other Topics Concern  . Not on file   Social History Narrative   Widowed 2003    Current Outpatient Prescriptions on File Prior to Visit  Medication Sig Dispense Refill  . allopurinol (ZYLOPRIM) 300 MG tablet TAKE 1 TABLET BY MOUTH EVERY DAY  30 tablet  4  . aspirin 81 MG tablet Take 81 mg by mouth daily.        . budesonide-formoterol (SYMBICORT) 160-4.5 MCG/ACT inhaler Inhale 2 puffs into the lungs 2 (two) times daily.  1 Inhaler  5  . clopidogrel (PLAVIX) 75 MG tablet TAKE 1 TABLET BY MOUTH EVERY DAY  30 tablet  11  . CRESTOR 40 MG tablet TAKE 1 TABLET BY MOUTH EVERY DAY  30 tablet  5  . glucose blood (ACCU-CHEK COMPACT STRIPS) test strip Once daily, dx 250.00       . KLOR-CON 10 10 MEQ tablet TAKE 1 TABLET DAILY  30 tablet  5  . losartan-hydrochlorothiazide (HYZAAR) 100-25 MG per tablet Take 1 tablet by mouth daily.  30 tablet  12  . methocarbamol (ROBAXIN) 500 MG tablet 1 tablet at bedtime  as needed for cramps      . VIAGRA 100 MG tablet TAKE AS NEEDED  1 tablet  4  . tiotropium (SPIRIVA HANDIHALER) 18 MCG inhalation capsule Place 1 capsule (18 mcg total) into inhaler and inhale daily.  30 capsule  6    No Known Allergies  Family History  Problem Relation Age of Onset  . Cancer Sister     Uncertain type  . Cancer Brother     Uncertain type    BP 122/68  Pulse 82  Temp 97.9 F (36.6 C) (Oral)  Resp 16  Wt 190 lb 8 oz (86.41 kg)  Review of Systems Denies sob and fever    Objective:   Physical Exam VITAL SIGNS:  See vs page GENERAL: no distress head: no deformity eyes: no periorbital swelling, no proptosis external nose and ears are normal mouth: no lesion seen Both eac's and tm's are normal LUNGS:  Clear to auscultation  Lab Results  Component Value Date   HGBA1C 6.3 08/14/2012   (cxr: nad)    Assessment &  Plan:  DM: well-controlled on no medication Acute bronchitis, recurrent COPD, therapy limited by noncompliance.  i'll do the best i can.

## 2012-08-15 ENCOUNTER — Encounter: Payer: Self-pay | Admitting: Endocrinology

## 2012-08-17 ENCOUNTER — Telehealth: Payer: Self-pay | Admitting: *Deleted

## 2012-08-17 NOTE — Telephone Encounter (Signed)
Called pt to inform of lab results, pt informed (letter also mailed to pt). Pt also informed to come in to have 02 sat done for paperwork from [facility].

## 2012-08-24 ENCOUNTER — Other Ambulatory Visit: Payer: Self-pay | Admitting: Endocrinology

## 2012-08-31 ENCOUNTER — Telehealth: Payer: Self-pay | Admitting: Endocrinology

## 2012-08-31 NOTE — Telephone Encounter (Signed)
Caller: Jalyn/Patient; Patient Name: Ryan Contreras; PCP: Romero Belling (Adults only); Best Callback Phone Number: [phone]; Reason for call: Was in on 08/14/12 and treated for ear infection.  Calling today because ear buzzing present on the 9/3 visit still persists.  The right ear.  Denies other symptoms.  describes buzzing as constant--patient can still sleep but is aware of it when he is awake-feels like hearing is decreased; Triaged using Ear: Hearing Change with a disposition to see provider within 72 hours decreased hearing or ringing in ear and symptoms began after starting new med.  Patient think it may have been present with ear infection prior to starting treatment but is not sure.  Offered to send to scheduler to make an appointment, but declined.  Stated he would call back.  Care advice given.  Caller demonstrated understanding.

## 2012-09-06 ENCOUNTER — Encounter: Payer: Self-pay | Admitting: Endocrinology

## 2012-09-06 ENCOUNTER — Ambulatory Visit (INDEPENDENT_AMBULATORY_CARE_PROVIDER_SITE_OTHER): Payer: BC Managed Care – PPO | Admitting: Endocrinology

## 2012-09-06 VITALS — BP 134/78 | HR 93 | Temp 97.0°F | Wt 191.0 lb

## 2012-09-06 DIAGNOSIS — H6120 Impacted cerumen, unspecified ear: Secondary | ICD-10-CM

## 2012-09-06 NOTE — Patient Instructions (Addendum)
Try putting some drops of peroxide into your right ear, 3 times a day. I hope you feel better soon.  If you don't feel better by next week, please come back. Please come back for a regular physical appointment in 3 months

## 2012-09-08 NOTE — Progress Notes (Signed)
  Subjective:    Patient ID: Ryan Contreras, male    DOB: Feb 05, 1941, 71 y.o.   MRN: [id number]  HPI Pt states few days of sensation of "blockage," at the right ear, and assoc decreased hearing Past Medical History  Diagnosis Date  . CAD, NATIVE VESSEL   . Gout, unspecified   . HYPERLIPIDEMIA   . HYPERTENSION   . PEPTIC ULCER DISEASE   . TOBACCO ABUSE   . Type 2 diabetes mellitus     Past Surgical History  Procedure Date  . Coronary stent placement     s/p    History   Social History  . Marital Status: Single    Spouse Name: N/A    Number of Children: 2  . 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 Smoking status: Current Every Day Smoker -- 1.0 packs/day for 50 years    Types: Cigarettes  . Smokeless tobacco: Not on file   Comment: currently smoking 5 to 6 cigs daily  . Alcohol Use: Yes     1 beer/day  . Drug Use: Not on file  . Sexually Active: Not on file   Other Topics Concern  . Not on file   Social History Narrative   Widowed 2003    Current Outpatient Prescriptions on File Prior to Visit  Medication Sig Dispense Refill  . allopurinol (ZYLOPRIM) 300 MG tablet TAKE 1 TABLET BY MOUTH EVERY DAY  30 tablet  4  . aspirin 81 MG tablet Take 81 mg by mouth daily.        . budesonide-formoterol (SYMBICORT) 160-4.5 MCG/ACT inhaler Inhale 2 puffs into the lungs 2 (two) times daily.  1 Inhaler  5  . clopidogrel (PLAVIX) 75 MG tablet TAKE 1 TABLET BY MOUTH EVERY DAY  30 tablet  11  . CRESTOR 40 MG tablet TAKE 1 TABLET BY MOUTH EVERY DAY  30 tablet  5  . glucose blood (ACCU-CHEK COMPACT STRIPS) test strip Once daily, dx 250.00       . KLOR-CON 10 10 MEQ tablet TAKE 1 TABLET DAILY  30 tablet  5  . losartan-hydrochlorothiazide (HYZAAR) 100-25 MG per tablet Take 1 tablet by mouth daily.  30 tablet  12  . methocarbamol (ROBAXIN) 500 MG tablet 1 tablet at bedtime as needed for cramps      . VIAGRA 100 MG tablet  TAKE AS NEEDED  1 tablet  4    No Known Allergies  Family History  Problem Relation Age of Onset  . Cancer Sister     Uncertain type  . Cancer Brother     Uncertain type    BP 134/78  Pulse 93  Temp 97 F (36.1 C) (Oral)  Wt 191 lb (86.637 kg)  SpO2 94%    Review of Systems Denies otalgia    Objective:   Physical Exam VITAL SIGNS:  See vs page GENERAL: no distress Right eac: occluded with cerumen  Intervention: right eac is irrigated  Repeat exam: much less cerumen, and pt feels better       Assessment & Plan:  Cerumen impaction, better

## 2012-09-11 ENCOUNTER — Encounter: Payer: Self-pay | Admitting: Internal Medicine

## 2012-09-13 ENCOUNTER — Telehealth: Payer: Self-pay | Admitting: Endocrinology

## 2012-09-13 NOTE — Telephone Encounter (Signed)
Pt was seen last week by Dr. [name] for a cerumen impaction. Pt states that problem has not gotten any better. It is "about the same." Please advise.

## 2012-09-13 NOTE — Telephone Encounter (Signed)
We do not have supplies in yet.

## 2012-09-13 NOTE — Telephone Encounter (Signed)
If we have the ability to rinse ears here, please advise ov, and we'll address

## 2012-09-14 NOTE — Telephone Encounter (Signed)
Harriett Sine from [place] office is calling Pt to get them placed on [facility] acute schedule today if possible.

## 2012-09-17 ENCOUNTER — Ambulatory Visit (INDEPENDENT_AMBULATORY_CARE_PROVIDER_SITE_OTHER): Payer: BC Managed Care – PPO | Admitting: Internal Medicine

## 2012-09-17 ENCOUNTER — Encounter: Payer: Self-pay | Admitting: Internal Medicine

## 2012-09-17 VITALS — BP 110/72 | HR 91 | Temp 98.0°F | Ht 73.0 in | Wt 195.0 lb

## 2012-09-17 DIAGNOSIS — H6120 Impacted cerumen, unspecified ear: Secondary | ICD-10-CM

## 2012-09-17 DIAGNOSIS — J449 Chronic obstructive pulmonary disease, unspecified: Secondary | ICD-10-CM | POA: Diagnosis not present

## 2012-09-17 DIAGNOSIS — I10 Essential (primary) hypertension: Secondary | ICD-10-CM | POA: Diagnosis not present

## 2012-09-17 NOTE — Assessment & Plan Note (Signed)
BP Readings from Last 3 Encounters:  09/17/12 110/72  09/06/12 134/78  08/14/12 122/68   The current medical regimen is effective;  continue present plan and medications.

## 2012-09-17 NOTE — Assessment & Plan Note (Signed)
Continue tobacco abuse, severe obst dz - no acute flare today The current medical regimen is effective;  continue present plan and medications. Advised cessation today

## 2012-09-17 NOTE — Patient Instructions (Signed)
It was good to see you today. We have reviewed your prior records including labs and tests today Medications reviewed, no changes at this time. Your ears have been irrigated of wax today -let us know if continued hearing problems persist for referral to audiologist and hearing testing Continue to think about giving up cigarettes! Use nicotine gum, nicotine patches or electronic cigarettes. If you're interested in medication to help you quit, please call  - let me know how I can help!

## 2012-09-17 NOTE — Progress Notes (Signed)
  Subjective:    Patient ID: Ryan Contreras, male    DOB: [birth year], 71 y.o.   MRN: [id number]  HPI  Here for wax in ear associated with mild decrease hearing - no pain or drainage Hx same  Past Medical History  Diagnosis Date  . CAD, NATIVE VESSEL   . Gout, unspecified   . HYPERLIPIDEMIA   . HYPERTENSION   . PEPTIC ULCER DISEASE   . TOBACCO ABUSE   . Type 2 diabetes mellitus      Review of Systems  Constitutional: Negative for fever and fatigue.  HENT: Positive for hearing loss. Negative for ear pain, tinnitus and ear discharge.   Neurological: Negative for dizziness and headaches.       Objective:   Physical Exam BP 110/72  Pulse 91  Temp 98 F (36.7 C) (Oral)  Ht 6\' 1"  (1.854 m)  Wt 195 lb (88.451 kg)  BMI 25.73 kg/m2  SpO2 92% Weight: 195 lb (88.451 kg)  Constitutional:  He appears well-developed and well-nourished. No distress.  HENT: NCAT, non tender sinus - L>R cerumen impaction, after irrigation, B TMs clear without erythema or effusion Neck: Normal range of motion. Neck supple. No JVD present. No thyromegaly present.  Cardiovascular: Normal rate, regular rhythm and normal heart sounds.  No murmur heard. no BLE edema Pulmonary/Chest: Effort normal and breath sounds normal. No respiratory distress. no wheezes.  Neurological: he is alert and oriented to person, place, and time. No cranial nerve deficit. Coordination normal.  Psychiatric: he has a normal mood and affect. behavior is normal. Judgment and thought content normal.   Procedure: wax removal Reason: wax impaction Risks and benefits of procedure discussed with the patient who agrees to proceed. Ear(s) irrigated with warm water. Large amount of wax removed. Instrumentation with metal ear loop was performed to accomplish wax removal. the patient tolerated procedure well.    Assessment & Plan:   Cerumen impaction - see above, resolved

## 2012-09-19 ENCOUNTER — Other Ambulatory Visit: Payer: Self-pay | Admitting: Endocrinology

## 2012-09-19 NOTE — Telephone Encounter (Signed)
Refill req for [facility]. Last filled on 01/26/12, Pt last seen on 9/26.

## 2012-10-03 ENCOUNTER — Ambulatory Visit: Payer: BC Managed Care – PPO | Admitting: Pulmonary Disease

## 2012-10-03 DIAGNOSIS — R972 Elevated prostate specific antigen [PSA]: Secondary | ICD-10-CM | POA: Diagnosis not present

## 2012-10-12 ENCOUNTER — Ambulatory Visit: Payer: BC Managed Care – PPO | Admitting: Pulmonary Disease

## 2012-10-25 ENCOUNTER — Telehealth: Payer: Self-pay | Admitting: Endocrinology

## 2012-10-25 NOTE — Telephone Encounter (Signed)
Tiffany with [facility] called stating that a CMN was sent over and the MD stated the patient did not need O2 and when the discharge order was sent over the MD wrote back that the order was from 2012 and could not be signed in 2013. Tiffany would like to know if they should discontinue the O2 or what? Please advise.

## 2012-10-26 ENCOUNTER — Ambulatory Visit (INDEPENDENT_AMBULATORY_CARE_PROVIDER_SITE_OTHER): Payer: BC Managed Care – PPO | Admitting: Pulmonary Disease

## 2012-10-26 ENCOUNTER — Encounter: Payer: Self-pay | Admitting: Pulmonary Disease

## 2012-10-26 VITALS — BP 112/60 | HR 72 | Temp 98.3°F | Ht 73.0 in | Wt 196.2 lb

## 2012-10-26 DIAGNOSIS — J449 Chronic obstructive pulmonary disease, unspecified: Secondary | ICD-10-CM

## 2012-10-26 NOTE — Progress Notes (Signed)
  Subjective:    Patient ID: Ryan Contreras, male    DOB: [birth year], 71 y.o.   MRN: [id number]  HPI The patient comes in today for followup of his known COPD.  He is continuing to smoke, and unfortunately has continued to have issues with access to medications.  He is using a dulera that he was given as needed, and it is unclear if he is really taking his maintenance medications.  He is having issues affording his medications.  He believes his exertional tolerance is at baseline, and denies having chest congestion or purulence.   Review of Systems  Constitutional: Negative for fever and unexpected weight change.  HENT: Positive for congestion and rhinorrhea. Negative for ear pain, nosebleeds, sore throat, sneezing, trouble swallowing, dental problem, postnasal drip and sinus pressure.   Eyes: Negative for redness and itching.  Respiratory: Positive for cough and wheezing. Negative for chest tightness and shortness of breath.   Cardiovascular: Negative for palpitations and leg swelling.  Gastrointestinal: Negative for nausea and vomiting.  Genitourinary: Negative for dysuria.  Musculoskeletal: Negative for joint swelling.  Skin: Negative for rash.  Neurological: Negative for headaches.  Hematological: Bruises/bleeds easily.  Psychiatric/Behavioral: Negative for dysphoric mood. The patient is not nervous/anxious.        Objective:   Physical Exam Well-developed male in no acute distress Nose without purulent discharge noted Neck without JVD or lymphadenopathy Chest with very decreased breath sounds throughout, no wheezing Cardiac exam with regular rate and rhythm Lower extremities without edema, no cyanosis Alert and oriented, moves all 4 extremities.       Assessment & Plan:

## 2012-10-26 NOTE — Patient Instructions (Addendum)
Stay on symbicort 160/4.5  2 inhalations am and pm everyday, and keep mouth rinsed. Stay on spiriva one inhalation each am Stay on albuterol for rescue/emergencies only, 2 puffs every 6 hrs if needed. Stop smoking.  This is the key to staying well. followup with me in 6mos

## 2012-10-26 NOTE — Assessment & Plan Note (Signed)
The patient unfortunately continues to smoke, and is having a difficult time affording his medications.  He tells me that he has not had a recent flareup, and I have stressed the importance of total smoking cessation.  He has had a flu shot this year.  I will continue him on his same medications, and see if we can get him qualified for the patient assistance program.

## 2012-12-03 ENCOUNTER — Other Ambulatory Visit: Payer: Self-pay

## 2012-12-03 ENCOUNTER — Telehealth: Payer: Self-pay | Admitting: Endocrinology

## 2012-12-03 MED ORDER — SILDENAFIL CITRATE 100 MG PO TABS: 100.0000 mg | ORAL_TABLET | ORAL | Status: DC | PRN

## 2012-12-03 NOTE — Telephone Encounter (Signed)
Patient called in requesting a refill of viagra

## 2012-12-10 ENCOUNTER — Telehealth: Payer: Self-pay | Admitting: Endocrinology

## 2012-12-10 MED ORDER — ROSUVASTATIN CALCIUM 40 MG PO TABS: 40.0000 mg | ORAL_TABLET | Freq: Every day | ORAL | Status: DC

## 2012-12-10 NOTE — Telephone Encounter (Signed)
The patient called to request refill of his "cholesterol medicine".  I believe the patient is requesting Crestor rx refill. The patient stated he is out of his medication and stated his pharmacy has had no response from Dr. George Hugh office. Please call the patient at [phone].

## 2012-12-18 ENCOUNTER — Ambulatory Visit (INDEPENDENT_AMBULATORY_CARE_PROVIDER_SITE_OTHER): Payer: BC Managed Care – PPO | Admitting: Endocrinology

## 2012-12-18 VITALS — BP 122/76 | HR 100 | Temp 97.5°F | Wt 196.0 lb

## 2012-12-18 DIAGNOSIS — E119 Type 2 diabetes mellitus without complications: Secondary | ICD-10-CM | POA: Diagnosis not present

## 2012-12-18 DIAGNOSIS — M109 Gout, unspecified: Secondary | ICD-10-CM

## 2012-12-18 DIAGNOSIS — E876 Hypokalemia: Secondary | ICD-10-CM

## 2012-12-18 DIAGNOSIS — Z0000 Encounter for general adult medical examination without abnormal findings: Secondary | ICD-10-CM | POA: Diagnosis not present

## 2012-12-18 DIAGNOSIS — R972 Elevated prostate specific antigen [PSA]: Secondary | ICD-10-CM

## 2012-12-18 DIAGNOSIS — I10 Essential (primary) hypertension: Secondary | ICD-10-CM | POA: Diagnosis not present

## 2012-12-18 DIAGNOSIS — E785 Hyperlipidemia, unspecified: Secondary | ICD-10-CM

## 2012-12-18 DIAGNOSIS — Z79899 Other long term (current) drug therapy: Secondary | ICD-10-CM

## 2012-12-18 MED ORDER — VARENICLINE TARTRATE 0.5 MG PO TABS: 0.5000 mg | ORAL_TABLET | Freq: Two times a day (BID) | ORAL | Status: DC

## 2012-12-18 NOTE — Progress Notes (Signed)
Subjective:    Patient ID: Ryan Contreras, male    DOB: 1941-03-16, 72 y.o.   MRN: [id number]  HPI here for regular wellness examination.  He's feeling pretty well in general, and says chronic med probs are stable, except as noted below Past Medical History  Diagnosis Date  . CAD, NATIVE VESSEL   . Gout, unspecified   . HYPERLIPIDEMIA   . HYPERTENSION   . PEPTIC ULCER DISEASE   . TOBACCO ABUSE   . Type 2 diabetes mellitus     Past Surgical History  Procedure Date  . Coronary stent placement     s/p    History   Social History  . Marital Status: Single    Spouse Name: N/A    Number of Children: 2  . 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 Smoking status: Current Every Day Smoker -- 1.0 packs/day for 50 years    Types: Cigarettes  . Smokeless tobacco: Not on file     Comment: currently smoking 5 to 6 cigs daily  . Alcohol Use: Yes     Comment: 1 beer/day  . Drug Use: Not on file  . Sexually Active: Not on file   Other Topics Concern  . Not on file   Social History Narrative   Widowed 2003    Current Outpatient Prescriptions on File Prior to Visit  Medication Sig Dispense Refill  . allopurinol (ZYLOPRIM) 300 MG tablet TAKE 1 TABLET BY MOUTH EVERY DAY  30 tablet  4  . aspirin 81 MG tablet Take 81 mg by mouth daily.        . budesonide-formoterol (SYMBICORT) 160-4.5 MCG/ACT inhaler Inhale 2 puffs into the lungs 2 (two) times daily.  1 Inhaler  5  . clopidogrel (PLAVIX) 75 MG tablet TAKE 1 TABLET BY MOUTH EVERY DAY  30 tablet  11  . glucose blood (ACCU-CHEK COMPACT STRIPS) test strip Once daily, dx 250.00       . KLOR-CON 10 10 MEQ tablet TAKE 1 TABLET DAILY  30 tablet  5  . losartan-hydrochlorothiazide (HYZAAR) 100-25 MG per tablet Take 1 tablet by mouth daily.  30 tablet  12  . methocarbamol (ROBAXIN) 500 MG tablet 1 tablet at bedtime as needed for cramps      . rosuvastatin (CRESTOR) 40 MG tablet  Take 1 tablet (40 mg total) by mouth daily.  30 tablet  5  . sildenafil (VIAGRA) 100 MG tablet Take 1 tablet (100 mg total) by mouth as needed for erectile dysfunction.  1 tablet  4  . tiotropium (SPIRIVA HANDIHALER) 18 MCG inhalation capsule Place 18 mcg into inhaler and inhale daily.        No Known Allergies  Family History  Problem Relation Age of Onset  . Cancer Sister     Uncertain type  . Cancer Brother     Uncertain type    BP 122/76  Pulse 100  Temp 97.5 F (36.4 C) (Oral)  Wt 196 lb (88.905 kg)  SpO2 95%     Review of Systems  Constitutional: Negative for fever and unexpected weight change.  HENT: Negative for hearing loss.   Eyes: Negative for visual disturbance.  Respiratory: Negative for shortness of breath.   Cardiovascular: Negative for chest pain.  Gastrointestinal: Negative for anal bleeding.  Genitourinary: Negative for hematuria.  Musculoskeletal: Negative for back pain.  Skin: Negative for rash.  Neurological: Negative for  syncope and headaches.  Hematological: Does not bruise/bleed easily.  Psychiatric/Behavioral: Negative for dysphoric mood.       Objective:   Physical Exam VS: see vs page GEN: no distress HEAD: head: no deformity eyes: no periorbital swelling, no proptosis external nose and ears are normal mouth: no lesion seen NECK: supple, thyroid is not enlarged CHEST WALL: no deformity.  Multiple keliods LUNGS: clear to auscultation BREASTS:  No gynecomastia CV: reg rate and rhythm, no murmur ABD: abdomen is soft, nontender.  no hepatosplenomegaly.  not distended.  no hernia GENITALIA/RECTAL/PROSTATE:  sees urology MUSCULOSKELETAL: muscle bulk and strength are grossly normal.  no obvious joint swelling.  gait is normal and steady EXTEMITIES: no deformity.  no ulcer on the feet.  feet are of normal color and temp.  no edema.  There is bilateral onychomycosis, and varicosities. PULSES: dorsalis pedis intact bilat.  no carotid  bruit NEURO:  cn 2-12 grossly intact.   readily moves all 4's.  sensation is intact to touch on the feet SKIN:  Normal texture and temperature.  No rash or suspicious lesion is visible.   NODES:  None palpable at the neck PSYCH: alert, oriented x3.  Does not appear anxious nor depressed.       Assessment & Plan:  Wellness visit today, with problems stable, except as noted. we discussed code status.  pt requests full code, but would not want to be started or maintained on artificial life-support measures if there was not a reasonable chance of recovery

## 2012-12-18 NOTE — Patient Instructions (Addendum)
please consider these measures for your health:  minimize alcohol.  do not use tobacco products.  have a colonoscopy at least every 10 years from age 70.  keep firearms safely stored.  always use seat belts.  have working smoke alarms in your home.  see an eye doctor and dentist regularly.  never drive under the influence of alcohol or drugs (including prescription drugs).  please let me know what your wishes would be, if artificial life support measures should become necessary.  it is critically important to prevent falling down (keep floor areas well-lit, dry, and free of loose objects.  If you have a cane, walker, or wheelchair, you should use it, even for short trips around the house.  Also, try not to rush). i have sent a prescription to your pharmacy, for a medication to help you quit smoking:    Varenicline oral tablets What is this medicine? VARENICLINE (var EN i kleen) is used to help people quit smoking. It can reduce the symptoms caused by stopping smoking. It is used with a patient support program recommended by your physician. This medicine may be used for other purposes; ask your health care provider or pharmacist if you have questions. What should I tell my health care provider before I take this medicine? They need to know if you have any of these conditions: -bipolar disorder, depression, schizophrenia or other mental illness -heart disease -kidney disease -peripheral vascular disease -stroke -suicidal thoughts, plans, or attempt; a previous suicide attempt by you or a family member -an unusual or allergic reaction to varenicline, other medicines, foods, dyes, or preservatives -pregnant or trying to get pregnant -breast-feeding How should I use this medicine? You should set a date to stop smoking and tell your doctor. Start this medicine one week before the quit date. You can also start taking this medicine before you choose a quit date, and then pick a quit date that is between  8 and 35 days of treatment with this medicine. Stick to your plan; ask about support groups or other ways to help you remain a 'quitter'. Take this medicine by mouth after eating. Take with a full glass of water. Follow the directions on the prescription label. Take your doses at regular intervals. Do not take your medicine more often than directed. A special MedGuide will be given to you by the pharmacist with each prescription and refill. Be sure to read this information carefully each time. Talk to your pediatrician regarding the use of this medicine in children. This medicine is not approved for use in children. Overdosage: If you think you have taken too much of this medicine contact a poison control center or emergency room at once. NOTE: This medicine is only for you. Do not share this medicine with others. What if I miss a dose? If you miss a dose, take it as soon as you can. If it is almost time for your next dose, take only that dose. Do not take double or extra doses. What may interact with this medicine? -insulin -other stop smoking aids -theophylline -warfarin This list may not describe all possible interactions. Give your health care provider a list of all the medicines, herbs, non-prescription drugs, or dietary supplements you use. Also tell them if you smoke, drink alcohol, or use illegal drugs. Some items may interact with your medicine. What should I watch for while using this medicine? Visit your doctor or health care professional for regular check ups. Ask for ongoing advice and encouragement  from your doctor or healthcare professional, friends, and family to help you quit. If you smoke while on this medication, quit again Your mouth may get dry. Chewing sugarless gum or sucking hard candy, and drinking plenty of water may help. Contact your doctor if the problem does not go away or is severe. You may get drowsy or dizzy. Do not drive, use machinery, or do anything that needs  mental alertness until you know how this medicine affects you. Do not stand or sit up quickly, especially if you are an older patient. This reduces the risk of dizzy or fainting spells. The use of this medicine may increase the chance of suicidal thoughts or actions. Pay special attention to how you are responding while on this medicine. Any worsening of mood, or thoughts of suicide or dying should be reported to your health care professional right away. What side effects may I notice from receiving this medicine? Side effects that you should report to your doctor or health care professional as soon as possible: -allergic reactions like skin rash, itching or hives, swelling of the face, lips, tongue, or throat -breathing problems -changes in vision -chest pain or chest tightness -confusion, trouble speaking or understanding -fast, irregular heartbeat -feeling faint or lightheaded, falls -fever -pain in legs when walking -problems with balance, talking, walking -ringing in ears -sudden numbness or weakness of the face, arm or leg -suicidal thoughts or other mood changes -trouble passing urine or change in the amount of urine -unusual bleeding or bruising -unusually weak or tired Side effects that usually do not require medical attention (report to your doctor or health care professional if they continue or are bothersome): -constipation -headache -nausea, vomiting -strange dreams -stomach gas -trouble sleeping This list may not describe all possible side effects. Call your doctor for medical advice about side effects. You may report side effects to FDA at 1-800-FDA-1088. Where should I keep my medicine? Keep out of the reach of children. Store at room temperature between 15 and 30 degrees C (59 and 86 degrees F). Throw away any unused medicine after the expiration date. NOTE: This sheet is a summary. It may not cover all possible information. If you have questions about this medicine,  talk to your doctor, pharmacist, or health care provider.  2013, [employer]. (07/05/2010 3:12:38 PM)

## 2012-12-24 ENCOUNTER — Ambulatory Visit: Payer: BC Managed Care – PPO | Admitting: Internal Medicine

## 2012-12-31 ENCOUNTER — Encounter: Payer: Self-pay | Admitting: Internal Medicine

## 2012-12-31 ENCOUNTER — Ambulatory Visit (INDEPENDENT_AMBULATORY_CARE_PROVIDER_SITE_OTHER): Payer: BC Managed Care – PPO | Admitting: Internal Medicine

## 2012-12-31 VITALS — BP 154/82 | HR 94 | Temp 98.6°F | Resp 16 | Wt 195.0 lb

## 2012-12-31 DIAGNOSIS — D485 Neoplasm of uncertain behavior of skin: Secondary | ICD-10-CM

## 2012-12-31 NOTE — Progress Notes (Signed)
Patient ID: Ryan Contreras, male   DOB: 18-Sep-1941, 72 y.o.   MRN: [id number]  Ref by Dr [name]   Procedure Note :     Procedure :  Skin biopsy   Indication:  Changing mole (s ) on face, eyelids - polypoid; symptomatic   Risks including unsuccessful procedure , bleeding, infection, bruising, scar, a need for another complete procedure and others were explained to the patient in detail as well as the benefits. Informed consent was obtained and signed.   The patient was placed in a decubitus position.  Lesion #1 on  R upper eyelid  measuring 3 mm   Skin over lesion #1  was prepped with Betadine and alcohol  and anesthetized with 0.3 cc of 2% lidocaine and epinephrine, using a 25-gauge 1 inch needle.  Shave biopsy with a sterile Dermablade was carried out in the usual fashion. Hyfrecator was used to destroy the rest of the lesion potentially left behind and for hemostasis. Band-Aid was applied with antibiotic ointment.    Lesion #2 on L upper eyelid    measuring 3 mm   Skin over lesion #2  was prepped with Betadine and alcohol  and anesthetized with 0.3 cc of 2% lidocaine and epinephrine each, using a 25-gauge 1 inch needle.  Shave biopsy with a sterile Dermablade was carried out in the usual fashion. Hyfrecator was used to destroy the rest of the lesion potentially left behind and for hemostasis. Band-Aid was applied with antibiotic ointment.  Lesion #3-6 on  B cheeks measuring 2 mm   Skin  was prepped with Betadine and alcohol  and anesthetized with 0.3 cc of 2% lidocaine and epinephrine, using a 25-gauge 1 inch needle.  Shave biopsy with a sterile Dermablade was carried out in the usual fashion. Hyfrecator was used to destroy the rest of the lesion potentially left behind and for hemostasis. Band-Aid was applied with antibiotic ointment.   Tolerated well. Complications none.  #1 was sent to the path lab #2-6 discarded      Postprocedure instructions :    A Band-Aid should be   changed twice daily. You can take a shower tomorrow.  Keep the wounds clean. You can wash them with liquid soap and water. Pat dry with gauze or a Kleenex tissue  Before applying antibiotic ointment and a Band-Aid.   You need to report immediately  if fever, chills or any signs of infection develop.    The biopsy results should be available in 1 -2 weeks.

## 2012-12-31 NOTE — Assessment & Plan Note (Signed)
Skin bx, removal - see procedure

## 2012-12-31 NOTE — Patient Instructions (Addendum)
Postprocedure instructions :    A Band-Aid should be  changed twice daily. You can take a shower tomorrow.  Keep the wounds clean. You can wash them with liquid soap and water. Pat dry with gauze or a Kleenex tissue  Before applying antibiotic ointment and a Band-Aid.   You need to report immediately  if fever, chills or any signs of infection develop.    The biopsy results should be available in 1 -2 weeks. 

## 2013-01-03 ENCOUNTER — Telehealth: Payer: Self-pay | Admitting: Internal Medicine

## 2013-01-03 NOTE — Telephone Encounter (Signed)
Pt informed

## 2013-01-03 NOTE — Telephone Encounter (Signed)
Ryan Contreras, please, inform patient that his bx was ok Thx

## 2013-01-24 ENCOUNTER — Ambulatory Visit: Payer: BC Managed Care – PPO | Admitting: Internal Medicine

## 2013-02-18 ENCOUNTER — Other Ambulatory Visit: Payer: Self-pay | Admitting: *Deleted

## 2013-02-18 MED ORDER — ALLOPURINOL 300 MG PO TABS: 300.0000 mg | ORAL_TABLET | Freq: Every day | ORAL | Status: DC

## 2013-02-19 ENCOUNTER — Other Ambulatory Visit: Payer: Self-pay | Admitting: *Deleted

## 2013-02-19 MED ORDER — ALLOPURINOL 300 MG PO TABS: 300.0000 mg | ORAL_TABLET | Freq: Every day | ORAL | Status: DC

## 2013-02-24 DIAGNOSIS — Z0279 Encounter for issue of other medical certificate: Secondary | ICD-10-CM

## 2013-02-27 ENCOUNTER — Telehealth: Payer: Self-pay | Admitting: Endocrinology

## 2013-02-27 NOTE — Telephone Encounter (Signed)
Is paperwork ready to be picked up - [phone] - Sherri S.

## 2013-02-28 ENCOUNTER — Telehealth: Payer: Self-pay | Admitting: Endocrinology

## 2013-02-28 NOTE — Telephone Encounter (Signed)
As requested, I have mailed the patient's DMV paperwork to the patient's home / Ryan S.

## 2013-02-28 NOTE — Telephone Encounter (Signed)
Pt states he will bring paperwork back to the office to be filled out.

## 2013-03-11 DIAGNOSIS — H2589 Other age-related cataract: Secondary | ICD-10-CM | POA: Diagnosis not present

## 2013-03-11 DIAGNOSIS — H5200 Hypermetropia, unspecified eye: Secondary | ICD-10-CM | POA: Diagnosis not present

## 2013-03-14 DIAGNOSIS — D075 Carcinoma in situ of prostate: Secondary | ICD-10-CM | POA: Diagnosis not present

## 2013-03-14 DIAGNOSIS — R972 Elevated prostate specific antigen [PSA]: Secondary | ICD-10-CM | POA: Diagnosis not present

## 2013-03-14 DIAGNOSIS — N401 Enlarged prostate with lower urinary tract symptoms: Secondary | ICD-10-CM | POA: Diagnosis not present

## 2013-03-18 ENCOUNTER — Other Ambulatory Visit (HOSPITAL_COMMUNITY): Payer: Self-pay | Admitting: Urology

## 2013-03-18 DIAGNOSIS — R972 Elevated prostate specific antigen [PSA]: Secondary | ICD-10-CM

## 2013-03-22 ENCOUNTER — Ambulatory Visit (INDEPENDENT_AMBULATORY_CARE_PROVIDER_SITE_OTHER): Payer: BC Managed Care – PPO | Admitting: Internal Medicine

## 2013-03-22 ENCOUNTER — Encounter: Payer: Self-pay | Admitting: Internal Medicine

## 2013-03-22 VITALS — BP 127/62 | HR 88 | Ht 73.0 in | Wt 187.8 lb

## 2013-03-22 DIAGNOSIS — I2510 Atherosclerotic heart disease of native coronary artery without angina pectoris: Secondary | ICD-10-CM | POA: Diagnosis not present

## 2013-03-22 DIAGNOSIS — E785 Hyperlipidemia, unspecified: Secondary | ICD-10-CM

## 2013-03-22 LAB — LIPID PANEL
LDL Cholesterol: 57 mg/dL (ref 0–99)
VLDL: 11.6 mg/dL (ref 0.0–40.0)

## 2013-03-22 NOTE — Patient Instructions (Addendum)
STOP SMOKING!!!!!!  Your physician wants you to follow-up in: 9 months with Dr. Tenny Craw.  You will receive a reminder letter in the mail two months in advance. If you don't receive a letter, please call our office to schedule the follow-up appointment.  LABS TODAY - Lipid Panel.

## 2013-03-22 NOTE — Progress Notes (Signed)
HPI Patient is a 72 year old with a history of CAD (last intervention was PTCA/stent to RCA for instent restenosis). Also a history of HTN, HL, tobacco use, COPD. I  Last lipid panel in January LDL was 51, HDL was 56.  I saw the patient earlier this summer. He was complaining of worsening SOB. With his Hx I recomm a myoview scan to r/o ischemia. This showed inferior scar with mild periinfarct ischemia. Echo showed normal walll motion. With his hx of interventions to the RCA I recomm a L heart cath to define, r/o worsening in RCA. LM: Normal   LAD: normal proximally, mid vessel has a hypodense area with moderate 60% stenosis. Difficult to isolate from D1 take off. Looks similar to last cath in 2006 Distal vessel is normal  D1: 40% ostial  Circumflex: 40% mid vessel disease  OM1: large branching artery mid circumflex lesion involves take off of OM 40%  RCA: Widely patent stent in proximal vessel 40% mid vessel disease normal distal vessel  PDA: normal  PLA: normal  Ventriculography: EF: 55 %, minimsl inferobasal hypokinesis  Hemodynamics:  Aortic Pressure: 136 69 mmHg LV Pressure: 132 17 mmHg  Impression: Will need to review films with interventionalist. None available. LAD lesion looks same as 2006. Patient denis CP  Breating is stable.  Not too active.  Still smoking   No Known Allergies  No Known Allergies  Current Outpatient Prescriptions  Medication Sig Dispense Refill  . allopurinol (ZYLOPRIM) 300 MG tablet Take 1 tablet (300 mg total) by mouth daily.  30 tablet  1  . aspirin 81 MG tablet Take 81 mg by mouth daily.        . budesonide-formoterol (SYMBICORT) 160-4.5 MCG/ACT inhaler Inhale 2 puffs into the lungs 2 (two) times daily.  1 Inhaler  5  . clopidogrel (PLAVIX) 75 MG tablet TAKE 1 TABLET BY MOUTH EVERY DAY  30 tablet  11  . glucose blood (ACCU-CHEK COMPACT STRIPS) test strip Once daily, dx 250.00       . KLOR-CON 10 10 MEQ tablet TAKE 1 TABLET DAILY  30 tablet  5  .  losartan-hydrochlorothiazide (HYZAAR) 100-25 MG per tablet Take 1 tablet by mouth daily.  30 tablet  12  . methocarbamol (ROBAXIN) 500 MG tablet 1 tablet at bedtime as needed for cramps      . rosuvastatin (CRESTOR) 40 MG tablet Take 1 tablet (40 mg total) by mouth daily.  30 tablet  5  . sildenafil (VIAGRA) 100 MG tablet Take 1 tablet (100 mg total) by mouth as needed for erectile dysfunction.  1 tablet  4  . tiotropium (SPIRIVA HANDIHALER) 18 MCG inhalation capsule Place 18 mcg into inhaler and inhale daily.      . varenicline (CHANTIX) 0.5 MG tablet Take 1 tablet (0.5 mg total) by mouth 2 (two) times daily. Please refill with 1 mg (continuing) packs  60 tablet  5   No current facility-administered medications for this visit.    Past Medical History  Diagnosis Date  . CAD, NATIVE VESSEL   . Gout, unspecified   . HYPERLIPIDEMIA   . HYPERTENSION   . PEPTIC ULCER DISEASE   . TOBACCO ABUSE   . Type 2 diabetes mellitus     Past Surgical History  Procedure Laterality Date  . Coronary stent placement      s/p    Family History  Problem Relation Age of Onset  . Cancer Sister     Uncertain type  .  Cancer Brother     Uncertain type    History   Social History  . Marital Status: Single    Spouse Name: N/A    Number of Children: 2  . 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 Smoking status: Current Every Day Smoker -- 1.00 packs/day for 50 years    Types: Cigarettes  . Smokeless tobacco: Not on file     Comment: currently smoking 5 to 6 cigs daily  . Alcohol Use: Yes     Comment: 1 beer/day  . Drug Use: Not on file  . Sexually Active: Not on file   Other Topics Concern  . Not on file   Social History Narrative   Widowed 2003    Review of Systems:  All systems reviewed.  They are negative to the above problem except as previously stated.  Vital Signs: BP 127/62  Pulse 88  Ht 6\' 1"  (1.854 m)  Wt 187  lb 12.8 oz (85.186 kg)  BMI 24.78 kg/m2  SpO2 96%  Physical Exam Patient is in NAD HEENT:  Normocephalic, atraumatic. EOMI, PERRLA.  Neck: JVP is normal.  No bruits.  Lungs: clear to auscultation. No rales no wheezes.  Heart: Regular rate and rhythm. Normal S1, S2. No S3.   No significant murmurs. PMI not displaced.  Abdomen:  Supple, nontender. Normal bowel sounds. No masses. No hepatomegaly.  Extremities:   Good distal pulses throughout. No lower extremity edema.  Musculoskeletal :moving all extremities.  Neuro:   alert and oriented x3.  CN II-XII grossly intact.  EKG  SR 83.   Assessment and Plan:  1.  CAD  No symptoms to sugg angina  Keep on  Same regimen  2. COPD  Will try to get samples of inhalers  COunselled on tob  3.  HL  WIll check lipids today.  4.  HTN  Adequate control  Encouraged him to stay active.  F/U in Jan 2015.

## 2013-03-26 ENCOUNTER — Other Ambulatory Visit: Payer: Self-pay | Admitting: Pulmonary Disease

## 2013-03-27 ENCOUNTER — Telehealth: Payer: Self-pay | Admitting: Pulmonary Disease

## 2013-03-27 MED ORDER — BUDESONIDE-FORMOTEROL FUMARATE 160-4.5 MCG/ACT IN AERO: 2.0000 | INHALATION_SPRAY | Freq: Two times a day (BID) | RESPIRATORY_TRACT | Status: DC

## 2013-03-27 NOTE — Telephone Encounter (Signed)
Rx has been sent in. Pt is aware. Has a pending OV with [facility] 04/25/13 @ 9am.

## 2013-03-29 ENCOUNTER — Ambulatory Visit (HOSPITAL_COMMUNITY)
Admission: RE | Admit: 2013-03-29 | Discharge: 2013-03-29 | Disposition: A | Discharge status: Home / Self Care | Payer: BC Managed Care – PPO | Source: Ambulatory Visit | Attending: Urology | Admitting: Urology

## 2013-03-29 DIAGNOSIS — R972 Elevated prostate specific antigen [PSA]: Secondary | ICD-10-CM | POA: Insufficient documentation

## 2013-03-29 DIAGNOSIS — N400 Enlarged prostate without lower urinary tract symptoms: Secondary | ICD-10-CM | POA: Diagnosis not present

## 2013-03-29 DIAGNOSIS — N402 Nodular prostate without lower urinary tract symptoms: Secondary | ICD-10-CM | POA: Diagnosis not present

## 2013-03-29 LAB — CREATININE, SERUM
Creatinine, Ser: 0.94 mg/dL (ref 0.50–1.35)
GFR calc Af Amer: >90 mL/min (ref >90)
GFR calc non Af Amer: 83 mL/min — ABNORMAL LOW (ref >90)

## 2013-03-29 MED ORDER — GADOBENATE DIMEGLUMINE 529 MG/ML IV SOLN
17.0000 mL | Freq: Once | INTRAVENOUS | Status: AC | PRN
  Administered 2013-03-29: 17 mL via INTRAVENOUS

## 2013-04-18 ENCOUNTER — Other Ambulatory Visit: Payer: Self-pay | Admitting: *Deleted

## 2013-04-18 MED ORDER — ALLOPURINOL 300 MG PO TABS: 300.0000 mg | ORAL_TABLET | Freq: Every day | ORAL | Status: DC

## 2013-04-18 MED ORDER — POTASSIUM CHLORIDE ER 10 MEQ PO TBCR: 10.0000 meq | EXTENDED_RELEASE_TABLET | Freq: Every day | ORAL | Status: DC

## 2013-04-25 ENCOUNTER — Ambulatory Visit (INDEPENDENT_AMBULATORY_CARE_PROVIDER_SITE_OTHER): Payer: BC Managed Care – PPO | Admitting: Pulmonary Disease

## 2013-04-25 ENCOUNTER — Encounter: Payer: Self-pay | Admitting: Pulmonary Disease

## 2013-04-25 VITALS — BP 142/60 | HR 88 | Temp 97.9°F | Ht 73.0 in | Wt 188.8 lb

## 2013-04-25 DIAGNOSIS — J449 Chronic obstructive pulmonary disease, unspecified: Secondary | ICD-10-CM

## 2013-04-25 DIAGNOSIS — J4489 Other specified chronic obstructive pulmonary disease: Secondary | ICD-10-CM

## 2013-04-25 NOTE — Assessment & Plan Note (Signed)
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  He is also having issues affording his medications, and we'll therefore give him paperwork for the patient assistance programs from the pharmaceutical companies.  I have stressed to him again the importance of smoking cessation, as well as staying compliant on his medications.

## 2013-04-25 NOTE — Progress Notes (Signed)
  Subjective:    Patient ID: Ryan Contreras, male    DOB: [birth year], 72 y.o.   MRN: [id number]  HPI The patient comes in today for followup of his known severe COPD.  He continues to have issues affording his medications, and is using them sporadically.  He also continues to smoke.  He feels that his breathing is at his usual baseline, and denies any significant cough, mucus, chest congestion.  He does use his rescue inhaler periodically.   Review of Systems  Constitutional: Negative for fever and unexpected weight change.  HENT: Positive for congestion, rhinorrhea and postnasal drip. Negative for ear pain, nosebleeds, sore throat, sneezing, trouble swallowing, dental problem and sinus pressure.   Eyes: Negative for redness and itching.  Respiratory: Positive for cough, shortness of breath and wheezing. Negative for chest tightness.   Cardiovascular: Negative for palpitations and leg swelling.  Gastrointestinal: Positive for constipation. Negative for nausea and vomiting.  Genitourinary: Negative for dysuria.  Musculoskeletal: Negative for joint swelling.  Skin: Negative for rash.  Neurological: Negative for headaches.  Hematological: Does not bruise/bleed easily.  Psychiatric/Behavioral: Negative for dysphoric mood. The patient is not nervous/anxious.        Objective:   Physical Exam Well developed male in nad Nose without purulence or discharge noted Neck without LN or TMG Chest with decreased bs, no crackles or wheezing Cor with rrr LE with minimal edema, no cyanosis Alert and oriented, moves all 4.        Assessment & Plan:

## 2013-04-25 NOTE — Patient Instructions (Addendum)
Stay on your current breathing medications.  Will give you paperwork for patient assistance program for these. Try and stop smoking as we discussed. followup with me in 6mos.

## 2013-06-09 ENCOUNTER — Other Ambulatory Visit: Payer: Self-pay | Admitting: Pulmonary Disease

## 2013-06-10 ENCOUNTER — Telehealth: Payer: Self-pay | Admitting: Pulmonary Disease

## 2013-06-10 ENCOUNTER — Other Ambulatory Visit: Payer: Self-pay | Admitting: Endocrinology

## 2013-06-10 NOTE — Telephone Encounter (Signed)
Pt symbicort was refilled earlier. Only other inhaler on list spiriva.  I called [facility] to confirm they did received this.  I called and spoke with pt and made him aware of this.  Nothing further was needed

## 2013-07-24 ENCOUNTER — Other Ambulatory Visit: Payer: Self-pay | Admitting: Internal Medicine

## 2013-08-08 ENCOUNTER — Other Ambulatory Visit: Payer: Self-pay | Admitting: Pulmonary Disease

## 2013-08-09 ENCOUNTER — Other Ambulatory Visit: Payer: Self-pay | Admitting: *Deleted

## 2013-08-09 MED ORDER — ROSUVASTATIN CALCIUM 40 MG PO TABS: 40.0000 mg | ORAL_TABLET | Freq: Every day | ORAL | Status: DC

## 2013-08-26 ENCOUNTER — Encounter: Payer: Self-pay | Admitting: Endocrinology

## 2013-08-26 ENCOUNTER — Ambulatory Visit (INDEPENDENT_AMBULATORY_CARE_PROVIDER_SITE_OTHER): Payer: BC Managed Care – PPO | Admitting: Endocrinology

## 2013-08-26 ENCOUNTER — Telehealth: Payer: Self-pay | Admitting: Pulmonary Disease

## 2013-08-26 VITALS — BP 126/70 | HR 78 | Ht 73.0 in | Wt 190.0 lb

## 2013-08-26 DIAGNOSIS — Z79899 Other long term (current) drug therapy: Secondary | ICD-10-CM

## 2013-08-26 DIAGNOSIS — E876 Hypokalemia: Secondary | ICD-10-CM

## 2013-08-26 DIAGNOSIS — E119 Type 2 diabetes mellitus without complications: Secondary | ICD-10-CM

## 2013-08-26 DIAGNOSIS — M109 Gout, unspecified: Secondary | ICD-10-CM

## 2013-08-26 DIAGNOSIS — I10 Essential (primary) hypertension: Secondary | ICD-10-CM | POA: Diagnosis not present

## 2013-08-26 DIAGNOSIS — R972 Elevated prostate specific antigen [PSA]: Secondary | ICD-10-CM

## 2013-08-26 LAB — HEPATIC FUNCTION PANEL
ALT: 20 U/L (ref 0–53)
AST: 26 U/L (ref 0–37)
Total Bilirubin: 0.4 mg/dL (ref 0.3–1.2)

## 2013-08-26 LAB — CBC WITH DIFFERENTIAL/PLATELET
Basophils Absolute: 0.1 K/uL (ref 0.0–0.1)
Basophils Relative: 0.7 % (ref 0.0–3.0)
Eosinophils Absolute: 0.3 K/uL (ref 0.0–0.7)
Eosinophils Relative: 2.5 % (ref 0.0–5.0)
HCT: 41.6 % (ref 39.0–52.0)
Hemoglobin: 13.6 g/dL (ref 13.0–17.0)
Lymphocytes Relative: 31.8 % (ref 12.0–46.0)
Lymphs Abs: 3.3 K/uL (ref 0.7–4.0)
MCHC: 32.8 g/dL (ref 30.0–36.0)
MCV: 99.3 fl (ref 78.0–100.0)
Monocytes Absolute: 1.3 K/uL — ABNORMAL HIGH (ref 0.1–1.0)
Monocytes Relative: 12.8 % — ABNORMAL HIGH (ref 3.0–12.0)
Neutro Abs: 5.4 K/uL (ref 1.4–7.7)
Neutrophils Relative %: 52.2 % (ref 43.0–77.0)
Platelets: 276 K/uL (ref 150.0–400.0)
RBC: 4.19 Mil/uL — ABNORMAL LOW (ref 4.22–5.81)
RDW: 15.6 % — ABNORMAL HIGH (ref 11.5–14.6)
WBC: 10.3 K/uL (ref 4.5–10.5)

## 2013-08-26 LAB — BASIC METABOLIC PANEL WITH GFR
BUN: 12 mg/dL (ref 6–23)
CO2: 31 meq/L (ref 19–32)
Calcium: 8.6 mg/dL (ref 8.4–10.5)
Chloride: 98 meq/L (ref 96–112)
Creatinine, Ser: 0.8 mg/dL (ref 0.4–1.5)
GFR: 117.88 mL/min
Glucose, Bld: 107 mg/dL — ABNORMAL HIGH (ref 70–99)
Potassium: 3.7 meq/L (ref 3.5–5.1)
Sodium: 136 meq/L (ref 135–145)

## 2013-08-26 LAB — URIC ACID: Uric Acid, Serum: 4.4 mg/dL (ref 4.0–7.8)

## 2013-08-26 LAB — URINALYSIS, ROUTINE W REFLEX MICROSCOPIC
Bilirubin Urine: NEGATIVE
Leukocytes, UA: NEGATIVE
Nitrite: NEGATIVE
Urobilinogen, UA: 0.2 (ref 0.0–1.0)

## 2013-08-26 LAB — PSA: PSA: 9.13 ng/mL — ABNORMAL HIGH (ref 0.10–4.00)

## 2013-08-26 MED ORDER — BUDESONIDE-FORMOTEROL FUMARATE 160-4.5 MCG/ACT IN AERO: INHALATION_SPRAY | RESPIRATORY_TRACT | Status: DC

## 2013-08-26 NOTE — Telephone Encounter (Signed)
I spoke with pt. He stated the symbicort is very expensive. I gave him pt assistant forms to fill out and he will bring them back to [place] with the information needed to submit to the company. 1 sample was giving to pt. Nothing further needed

## 2013-08-26 NOTE — Patient Instructions (Addendum)
blood tests are being requested for you today.  We'll contact you with results. Please come back for a regular physical appointment in 6 months. 

## 2013-08-26 NOTE — Telephone Encounter (Signed)
Pt is in the lobby now. Ryan Contreras

## 2013-08-26 NOTE — Telephone Encounter (Signed)
[id number] x1 

## 2013-08-26 NOTE — Progress Notes (Signed)
Subjective:    Patient ID: Ryan Contreras, male    DOB: 1940-12-29, 72 y.o.   MRN: [id number]  HPI The state of at least three ongoing medical problems is addressed today, with interval history of each noted here: Pt returns for f/u of type 2 DM (dx'ed 2009; he has never required medication).  Denies weight change. Gout: no recent sxs. Hypokalemia: he denies muscle cramps Past Medical History  Diagnosis Date  . CAD, NATIVE VESSEL   . Gout, unspecified   . HYPERLIPIDEMIA   . HYPERTENSION   . PEPTIC ULCER DISEASE   . TOBACCO ABUSE   . Type 2 diabetes mellitus     Past Surgical History  Procedure Laterality Date  . Coronary stent placement      s/p    History   Social History  . Marital Status: Single    Spouse Name: N/A    Number of Children: 2  . 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 Smoking status: Current Every Day Smoker -- 1.00 packs/day for 50 years    Types: Cigarettes  . Smokeless tobacco: Not on file     Comment: currently smoking 5 to 6 cigs daily  . Alcohol Use: Yes     Comment: 1 beer/day  . Drug Use: Not on file  . Sexual Activity: Not on file   Other Topics Concern  . Not on file   Social History Narrative   Widowed 2003    Current Outpatient Prescriptions on File Prior to Visit  Medication Sig Dispense Refill  . allopurinol (ZYLOPRIM) 300 MG tablet Take 1 tablet (300 mg total) by mouth daily.  30 tablet  3  . aspirin 81 MG tablet Take 81 mg by mouth daily.        . clopidogrel (PLAVIX) 75 MG tablet TAKE 1 TABLET BY MOUTH EVERY DAY  30 tablet  5  . glucose blood (ACCU-CHEK COMPACT STRIPS) test strip Once daily, dx 250.00       . losartan-hydrochlorothiazide (HYZAAR) 100-25 MG per tablet TAKE 1 TABLET EVERY DAY  30 tablet  8  . methocarbamol (ROBAXIN) 500 MG tablet 1 tablet at bedtime as needed for cramps      . potassium chloride (KLOR-CON 10) 10 MEQ tablet Take 1 tablet (10 mEq  total) by mouth daily.  30 tablet  4  . rosuvastatin (CRESTOR) 40 MG tablet Take 1 tablet (40 mg total) by mouth daily.  30 tablet  2  . sildenafil (VIAGRA) 100 MG tablet Take 1 tablet (100 mg total) by mouth as needed for erectile dysfunction.  1 tablet  4  . SYMBICORT 160-4.5 MCG/ACT inhaler INHALE 2 PUFFS BY MOUTH TWICE A DAY  1 Inhaler  2  . tiotropium (SPIRIVA HANDIHALER) 18 MCG inhalation capsule Place 18 mcg into inhaler and inhale daily.      . varenicline (CHANTIX) 0.5 MG tablet Take 1 tablet (0.5 mg total) by mouth 2 (two) times daily. Please refill with 1 mg (continuing) packs  60 tablet  5   No current facility-administered medications on file prior to visit.    No Known Allergies  Family History  Problem Relation Age of Onset  . Cancer Sister     Uncertain type  . Cancer Brother     Uncertain type    BP 126/70  Pulse 78  Ht 6\' 1"  (1.854 m)  Wt 190 lb (86.183 kg)  BMI 25.07 kg/m2  SpO2 98%  Review of Systems Denies cough and chest pain    Objective:   Physical Exam VITAL SIGNS:  See vs page GENERAL: no distress      Assessment & Plan:  DM: stable.  No med needed Gout: well-controlled hypokalemia: resolved

## 2013-10-05 ENCOUNTER — Other Ambulatory Visit: Payer: Self-pay | Admitting: Endocrinology

## 2013-10-07 ENCOUNTER — Other Ambulatory Visit: Payer: Self-pay | Admitting: *Deleted

## 2013-10-07 MED ORDER — POTASSIUM CHLORIDE ER 10 MEQ PO TBCR: 10.0000 meq | EXTENDED_RELEASE_TABLET | Freq: Every day | ORAL | Status: DC

## 2013-10-28 ENCOUNTER — Ambulatory Visit (INDEPENDENT_AMBULATORY_CARE_PROVIDER_SITE_OTHER): Payer: BC Managed Care – PPO | Admitting: Pulmonary Disease

## 2013-10-28 ENCOUNTER — Encounter: Payer: Self-pay | Admitting: Pulmonary Disease

## 2013-10-28 VITALS — BP 116/58 | HR 90 | Temp 97.8°F | Ht 73.0 in | Wt 191.6 lb

## 2013-10-28 DIAGNOSIS — F17200 Nicotine dependence, unspecified, uncomplicated: Secondary | ICD-10-CM

## 2013-10-28 DIAGNOSIS — J438 Other emphysema: Secondary | ICD-10-CM | POA: Diagnosis not present

## 2013-10-28 DIAGNOSIS — J439 Emphysema, unspecified: Secondary | ICD-10-CM

## 2013-10-28 MED ORDER — BUDESONIDE-FORMOTEROL FUMARATE 160-4.5 MCG/ACT IN AERO: INHALATION_SPRAY | RESPIRATORY_TRACT | Status: DC

## 2013-10-28 NOTE — Assessment & Plan Note (Signed)
The patient has remained at a stable baseline from a breathing standpoint, but continues to have dyspnea on exertion and a chronic cough with white foamy mucus.  He is staying on symbicort, but has not brought in his paperwork for Spiriva.  I have encouraged him to do this.  He also is continuing to smoke, and I explained that his symptoms will not improve until he is able to stop completely.

## 2013-10-28 NOTE — Patient Instructions (Signed)
Stay on symbicort 2 puffs am and pm everyday. If you will bring in paperwork for spiriva, will see if we can get you some assistance on this medication.  If you don't try, you will never know. Stop smoking.  This is the key to improving your breathing and cough. Can try mucinex dm one in am and pm for your cough and mucus. followup with me in 6mos.

## 2013-10-28 NOTE — Addendum Note (Signed)
Addended by: Maisie Fus on: 10/28/2013 10:05 AM   Modules accepted: Orders

## 2013-10-28 NOTE — Progress Notes (Signed)
  Subjective:    Patient ID: Ryan Contreras, male    DOB: 1941/08/06, 72 y.o.   MRN: [id number]  HPI The patient comes in today for followup of his known COPD.  Unfortunately he continues to smoke, and has not been able to afford Spiriva.  However, he has not brought in his paperwork for the patient assistance program either.  He does stay on symbicort compliantly.  He feels that his breathing is at baseline, but has had some increased cough with white foamy mucus.  He has not had any purulence or chest congestion.  I have reminded him that ongoing smoking can irritate his cough.   Review of Systems  Constitutional: Negative for fever and unexpected weight change.  HENT: Negative for congestion, dental problem, ear pain, nosebleeds, postnasal drip, rhinorrhea, sinus pressure, sneezing, sore throat and trouble swallowing.   Eyes: Negative for redness and itching.  Respiratory: Positive for cough. Negative for chest tightness, shortness of breath and wheezing.   Cardiovascular: Negative for palpitations and leg swelling.  Gastrointestinal: Negative for nausea and vomiting.  Genitourinary: Negative for dysuria.  Musculoskeletal: Negative for joint swelling.  Skin: Negative for rash.  Neurological: Negative for headaches.  Hematological: Does not bruise/bleed easily.  Psychiatric/Behavioral: Negative for dysphoric mood. The patient is not nervous/anxious.        Objective:   Physical Exam Overweight male in no acute distress Nose without purulent discharge noted Neck without lymphadenopathy or thyromegaly Chest with very diminished breath sounds, no active wheezing Cardiac exam with regular rate and rhythm, 2/6 systolic murmur Lower extremities without edema, no cyanosis alert and oriented, moves all 4 extremities.       Assessment & Plan:

## 2013-11-11 ENCOUNTER — Other Ambulatory Visit: Payer: Self-pay

## 2013-11-11 MED ORDER — ALLOPURINOL 300 MG PO TABS: 300.0000 mg | ORAL_TABLET | Freq: Every day | ORAL | Status: DC

## 2013-11-11 NOTE — Telephone Encounter (Signed)
Allopurinol refilled sent to [facility]. /met

## 2013-11-29 ENCOUNTER — Other Ambulatory Visit: Payer: Self-pay | Admitting: Endocrinology

## 2013-12-07 ENCOUNTER — Other Ambulatory Visit: Payer: Self-pay | Admitting: Pulmonary Disease

## 2013-12-09 NOTE — Telephone Encounter (Signed)
Symbicort 160 rx was sent to [facility] on 10/28/13 #1 x 6. Called [facility].  Was advised rx from Nov 2014 was received. Nothing further is needed.  Will sign off.

## 2014-02-22 ENCOUNTER — Other Ambulatory Visit: Payer: Self-pay | Admitting: Internal Medicine

## 2014-03-03 ENCOUNTER — Ambulatory Visit (INDEPENDENT_AMBULATORY_CARE_PROVIDER_SITE_OTHER): Payer: BC Managed Care – PPO | Admitting: Internal Medicine

## 2014-03-03 ENCOUNTER — Encounter: Payer: Self-pay | Admitting: Internal Medicine

## 2014-03-03 VITALS — BP 128/61 | HR 64 | Ht 73.0 in | Wt 183.0 lb

## 2014-03-03 DIAGNOSIS — J449 Chronic obstructive pulmonary disease, unspecified: Secondary | ICD-10-CM | POA: Diagnosis not present

## 2014-03-03 DIAGNOSIS — E785 Hyperlipidemia, unspecified: Secondary | ICD-10-CM | POA: Diagnosis not present

## 2014-03-03 DIAGNOSIS — Z720 Tobacco use: Secondary | ICD-10-CM

## 2014-03-03 DIAGNOSIS — I2510 Atherosclerotic heart disease of native coronary artery without angina pectoris: Secondary | ICD-10-CM

## 2014-03-03 DIAGNOSIS — F17200 Nicotine dependence, unspecified, uncomplicated: Secondary | ICD-10-CM | POA: Diagnosis not present

## 2014-03-03 LAB — CBC WITH DIFFERENTIAL/PLATELET
BASOS PCT: 0.9 % (ref 0.0–3.0)
Basophils Absolute: 0.1 10*3/uL (ref 0.0–0.1)
EOS PCT: 1.8 % (ref 0.0–5.0)
Eosinophils Absolute: 0.2 10*3/uL (ref 0.0–0.7)
HEMATOCRIT: 40.5 % (ref 39.0–52.0)
Hemoglobin: 13.2 g/dL (ref 13.0–17.0)
Lymphocytes Relative: 29.7 % (ref 12.0–46.0)
Lymphs Abs: 2.9 10*3/uL (ref 0.7–4.0)
MCHC: 32.5 g/dL (ref 30.0–36.0)
MCV: 98.8 fl (ref 78.0–100.0)
MONO ABS: 1.4 10*3/uL — AB (ref 0.1–1.0)
MONOS PCT: 14.5 % — AB (ref 3.0–12.0)
Neutro Abs: 5.2 10*3/uL (ref 1.4–7.7)
Neutrophils Relative %: 53.1 % (ref 43.0–77.0)
Platelets: 294 10*3/uL (ref 150.0–400.0)
RBC: 4.1 Mil/uL — AB (ref 4.22–5.81)
RDW: 16 % — ABNORMAL HIGH (ref 11.5–14.6)
WBC: 9.7 10*3/uL (ref 4.5–10.5)

## 2014-03-03 LAB — BASIC METABOLIC PANEL
BUN: 12 mg/dL (ref 6–23)
CO2: 34 mEq/L — ABNORMAL HIGH (ref 19–32)
Calcium: 8.8 mg/dL (ref 8.4–10.5)
Chloride: 97 mEq/L (ref 96–112)
Creatinine, Ser: 0.8 mg/dL (ref 0.4–1.5)
GFR: 121.06 mL/min (ref >60.00)
Glucose, Bld: 102 mg/dL — ABNORMAL HIGH (ref 70–99)
POTASSIUM: 3.5 meq/L (ref 3.5–5.1)
SODIUM: 138 meq/L (ref 135–145)

## 2014-03-03 LAB — LIPID PANEL
Cholesterol: 127 mg/dL (ref 0–200)
HDL: 65.6 mg/dL (ref >39.00)
LDL Cholesterol: 51 mg/dL (ref 0–99)
TRIGLYCERIDES: 51 mg/dL (ref 0.0–149.0)
Total CHOL/HDL Ratio: 2
VLDL: 10.2 mg/dL (ref 0.0–40.0)

## 2014-03-03 LAB — AST: AST: 23 U/L (ref 0–37)

## 2014-03-03 NOTE — Progress Notes (Signed)
HPI Patient is a 73 year old with a history of CAD (last intervention was PTCA/stent to RCA for instent restenosis). Also a history of HTN, HL, tobacco use, COPD. I  Last cath:   LAD: normal proximally, mid vessel has a hypodense area with moderate 60% stenosis. Difficult to isolate from D1 take off. Looks similar to last cath in 2006 Distal vessel is normal  D1: 40% ostial  Circumflex: 40% mid vessel disease  OM1: large branching artery mid circumflex lesion involves take off of OM 40%  RCA: Widely patent stent in proximal vessel 40% mid vessel disease normal distal vessel  PDA: normal PLA: normal  Ventriculography: EF: 55 %, minimsl inferobasal hypokinesis  Plan was for medical therapy as lesion did not appear flow limiting  Patient was last in clinic in April 2014  At that time LDL was 57 He denis CP Still smoking 1/3 ppd.  Says he has whitish sputum.    No Known Allergies  Current Outpatient Prescriptions  Medication Sig Dispense Refill  . allopurinol (ZYLOPRIM) 300 MG tablet Take 1 tablet (300 mg total) by mouth daily.  30 tablet  3  . aspirin 81 MG tablet Take 81 mg by mouth daily.        . budesonide-formoterol (SYMBICORT) 160-4.5 MCG/ACT inhaler INHALE 2 PUFFS BY MOUTH TWICE A DAY  1 Inhaler  6  . clopidogrel (PLAVIX) 75 MG tablet TAKE 1 TABLET BY MOUTH EVERY DAY  30 tablet  3  . CRESTOR 40 MG tablet TAKE 1 TABLET BY MOUTH EVERY DAY  30 tablet  2  . glucose blood (ACCU-CHEK COMPACT STRIPS) test strip Once daily, dx 250.00       . losartan-hydrochlorothiazide (HYZAAR) 100-25 MG per tablet TAKE 1 TABLET EVERY DAY  30 tablet  8  . methocarbamol (ROBAXIN) 500 MG tablet 1 tablet at bedtime as needed for cramps      . potassium chloride (KLOR-CON 10) 10 MEQ tablet Take 1 tablet (10 mEq total) by mouth daily.  30 tablet  4  . sildenafil (VIAGRA) 100 MG tablet Take 1 tablet (100 mg total) by mouth as needed for erectile dysfunction.  1 tablet  4  . tiotropium (SPIRIVA HANDIHALER) 18 MCG  inhalation capsule Place 18 mcg into inhaler and inhale daily.       No current facility-administered medications for this visit.    Past Medical History  Diagnosis Date  . CAD, NATIVE VESSEL   . Gout, unspecified   . HYPERLIPIDEMIA   . HYPERTENSION   . PEPTIC ULCER DISEASE   . TOBACCO ABUSE   . Type 2 diabetes mellitus     Past Surgical History  Procedure Laterality Date  . Coronary stent placement      s/p    Family History  Problem Relation Age of Onset  . Cancer Sister     Uncertain type  . Cancer Brother     Uncertain type    History   Social History  . Marital Status: Single    Spouse Name: N/A    Number of Children: 2  . 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 Smoking status: Current Every Day Smoker -- 1.00 packs/day for 50 years    Types: Cigarettes  . Smokeless tobacco: Not on file     Comment: currently smoking 10 cigs daily  . Alcohol Use: Yes     Comment: 1 beer/day  .  Drug Use: Not on file  . Sexual Activity: Not on file   Other Topics Concern  . Not on file   Social History Narrative   Widowed 2003    Review of Systems:  All systems reviewed.  They are negative to the above problem except as previously stated.  Vital Signs: BP 128/61  Pulse 64  Ht 6\' 1"  (1.854 m)  Wt 183 lb (83.008 kg)  BMI 24.15 kg/m2  Physical Exam Patient is in NAD HEENT:  Normocephalic, atraumatic. EOMI, PERRLA.  Neck: JVP is normal.  No bruits.  Lungs: clear to auscultation. No rales  Diffuse wheezes.   Heart: Regular rate and rhythm. Normal S1, S2. No S3.   No significant murmurs. PMI not displaced.  Abdomen:  Supple, nontender. Normal bowel sounds. No masses. No hepatomegaly.  Extremities:   Good distal pulses throughout. No lower extremity edema.  Musculoskeletal :moving all extremities.  Neuro:   alert and oriented x3.  CN II-XII grossly intact.  EKG  SR 84  Occasional PAC  .    Assessment and Plan:  1.  CAD  No symptoms to sugg angina  Keep on  Same regimen  Check BMET and CBC    2. COPD  F/U with K Clance  COunselled on tob  3.  HL  WIll check lipids today.  4.  HTN  Adequate control  F/U in December.

## 2014-03-03 NOTE — Patient Instructions (Signed)
Your physician wants you to follow-up in: [place] will receive a reminder letter in the mail two months in advance. If you don't receive a letter, please call our office to schedule the follow-up appointment.   Your physician recommends that you HAVE LAB WORK TODAY

## 2014-03-14 ENCOUNTER — Encounter: Payer: Self-pay | Admitting: *Deleted

## 2014-03-21 ENCOUNTER — Other Ambulatory Visit: Payer: Self-pay | Admitting: Endocrinology

## 2014-04-16 ENCOUNTER — Other Ambulatory Visit: Payer: Self-pay

## 2014-04-16 MED ORDER — ALLOPURINOL 300 MG PO TABS: 300.0000 mg | ORAL_TABLET | Freq: Every day | ORAL | Status: DC

## 2014-04-28 ENCOUNTER — Ambulatory Visit (INDEPENDENT_AMBULATORY_CARE_PROVIDER_SITE_OTHER): Payer: BC Managed Care – PPO | Admitting: Pulmonary Disease

## 2014-04-28 ENCOUNTER — Encounter: Payer: Self-pay | Admitting: Pulmonary Disease

## 2014-04-28 VITALS — BP 130/70 | HR 90 | Temp 98.1°F | Ht 73.0 in | Wt 179.8 lb

## 2014-04-28 DIAGNOSIS — J438 Other emphysema: Secondary | ICD-10-CM | POA: Diagnosis not present

## 2014-04-28 DIAGNOSIS — J439 Emphysema, unspecified: Secondary | ICD-10-CM

## 2014-04-28 DIAGNOSIS — I2510 Atherosclerotic heart disease of native coronary artery without angina pectoris: Secondary | ICD-10-CM

## 2014-04-28 MED ORDER — ALBUTEROL SULFATE HFA 108 (90 BASE) MCG/ACT IN AERS: 2.0000 | INHALATION_SPRAY | Freq: Four times a day (QID) | RESPIRATORY_TRACT | Status: DC | PRN

## 2014-04-28 NOTE — Progress Notes (Signed)
   Subjective:    Patient ID: Ryan Contreras, male    DOB: August 07, 1941, 73 y.o.   MRN: [id number]  HPI The patient comes in today for followup of his known severe COPD. He is maintaining on Symbicort, and tells me he has not had an acute exacerbation since last visit. We have tried to get him on Spiriva, but the patient could not afford the medication, and was not willing to fill out patient assistance paperwork. He also continues to smoke. He feels that his exertional tolerance is at baseline.   Review of Systems  Constitutional: Negative for fever and unexpected weight change.  HENT: Negative for congestion, dental problem, ear pain, nosebleeds, postnasal drip, rhinorrhea, sinus pressure, sneezing, sore throat and trouble swallowing.   Eyes: Negative for redness and itching.  Respiratory: Positive for cough and shortness of breath. Negative for chest tightness and wheezing.   Cardiovascular: Negative for palpitations and leg swelling.  Gastrointestinal: Negative for nausea and vomiting.  Genitourinary: Negative for dysuria.  Musculoskeletal: Negative for joint swelling.  Skin: Negative for rash.  Neurological: Negative for headaches.  Hematological: Does not bruise/bleed easily.  Psychiatric/Behavioral: Negative for dysphoric mood. The patient is not nervous/anxious.        Objective:   Physical Exam Overweight male in no acute distress Nose without purulence or discharge noted Neck without lymphadenopathy or thyromegaly Chest with decreased breath sounds, no active wheezing Cardiac exam with regular rate and rhythm Lower extremities with mild edema, no cyanosis Alert and oriented, moves all 4 extremities.       Assessment & Plan:

## 2014-04-28 NOTE — Assessment & Plan Note (Signed)
The patient appears to be maintaining his usual baseline on his current bronchodilator regimen. I have stressed to him again the importance of total smoking cessation. He is not willing to fill out the paperwork for Spiriva patient assistance program, and is not able to afford the medication on his own. We'll therefore maintain him on Symbicort, and encouraged him to stop smoking.

## 2014-04-28 NOTE — Patient Instructions (Signed)
Stay on symbicort am and pm everyday.  Rinse mouth out well. You need to keep a rescue inhaler available in case you get into trouble.  Use proair 2 inhalations up to every 6hrs for emergencies only.  Keep this with you.  We will send in a prescription for you to have on hand as needed. Stop smoking. followup with me again in 48mos.

## 2014-04-28 NOTE — Addendum Note (Signed)
Addended by: Lilli Few on: 04/28/2014 09:57 AM   Modules accepted: Orders

## 2014-06-01 ENCOUNTER — Other Ambulatory Visit: Payer: Self-pay | Admitting: Endocrinology

## 2014-07-01 ENCOUNTER — Other Ambulatory Visit: Payer: Self-pay | Admitting: Endocrinology

## 2014-07-02 ENCOUNTER — Other Ambulatory Visit: Payer: Self-pay

## 2014-07-02 MED ORDER — ALLOPURINOL 300 MG PO TABS: 300.0000 mg | ORAL_TABLET | Freq: Every day | ORAL | Status: DC

## 2014-09-03 ENCOUNTER — Other Ambulatory Visit: Payer: Self-pay | Admitting: Internal Medicine

## 2014-09-11 ENCOUNTER — Other Ambulatory Visit: Payer: Self-pay | Admitting: Endocrinology

## 2014-09-12 NOTE — Telephone Encounter (Signed)
Please refill x 1 cpx is due 

## 2014-09-18 ENCOUNTER — Encounter: Payer: Self-pay | Admitting: Internal Medicine

## 2014-09-25 ENCOUNTER — Other Ambulatory Visit: Payer: Self-pay | Admitting: Endocrinology

## 2014-09-26 NOTE — Telephone Encounter (Signed)
Rx sent 

## 2014-09-26 NOTE — Telephone Encounter (Signed)
Please advise if ok to refill rx. Medication was last refill on 12/12/2012.

## 2014-09-26 NOTE — Telephone Encounter (Signed)
Please refill x 1 cpx is due 

## 2014-10-03 ENCOUNTER — Other Ambulatory Visit: Payer: Self-pay | Admitting: Endocrinology

## 2014-10-03 ENCOUNTER — Other Ambulatory Visit: Payer: Self-pay | Admitting: Pulmonary Disease

## 2014-10-03 NOTE — Telephone Encounter (Signed)
Please advise if ok to refill rx pt last seen on 08/26/2013. Thanks!

## 2014-10-03 NOTE — Telephone Encounter (Signed)
Please refill x 1 Ov is due  

## 2014-10-03 NOTE — Telephone Encounter (Signed)
Rx sent 

## 2014-10-15 ENCOUNTER — Encounter: Payer: Self-pay | Admitting: Internal Medicine

## 2014-10-27 ENCOUNTER — Ambulatory Visit (INDEPENDENT_AMBULATORY_CARE_PROVIDER_SITE_OTHER): Payer: BC Managed Care – PPO | Admitting: Pulmonary Disease

## 2014-10-27 ENCOUNTER — Encounter: Payer: Self-pay | Admitting: Pulmonary Disease

## 2014-10-27 VITALS — BP 124/72 | HR 91 | Temp 98.0°F | Ht 73.0 in | Wt 182.8 lb

## 2014-10-27 DIAGNOSIS — J438 Other emphysema: Secondary | ICD-10-CM

## 2014-10-27 DIAGNOSIS — I2510 Atherosclerotic heart disease of native coronary artery without angina pectoris: Secondary | ICD-10-CM

## 2014-10-27 NOTE — Assessment & Plan Note (Signed)
The patient has been stable since the last visit on his current bronchodilator regimen, but unfortunately continues to smoke. I have had a long discussion with him again about the importance of total smoking cessation. I've also encouraged him to work aggressively on some type of conditioning program.

## 2014-10-27 NOTE — Progress Notes (Signed)
   Subjective:    Patient ID: Ryan Contreras, male    DOB: 27-Jul-1941, 73 y.o.   MRN: [id number]  HPI The patient comes in today for follow-up of his known severe COPD. He is staying on Symbicort compliantly, but has not been able to afford Spiriva in the past. Unfortunately, he continues to smoke, and really has no intention to quit. I have talked with him further about this today. He has not had an acute exacerbation or pulmonary infection since last visit. He has mild cough with only white sputum production.   Review of Systems  Constitutional: Negative for fever and unexpected weight change.  HENT: Negative for congestion, dental problem, ear pain, nosebleeds, postnasal drip, rhinorrhea, sinus pressure, sneezing, sore throat and trouble swallowing.   Eyes: Negative for redness and itching.  Respiratory: Positive for cough, shortness of breath and wheezing. Negative for chest tightness.   Cardiovascular: Negative for palpitations and leg swelling.  Gastrointestinal: Negative for nausea and vomiting.  Genitourinary: Negative for dysuria.  Musculoskeletal: Negative for joint swelling.  Skin: Negative for rash.  Neurological: Negative for headaches.  Hematological: Does not bruise/bleed easily.  Psychiatric/Behavioral: Negative for dysphoric mood. The patient is not nervous/anxious.        Objective:   Physical Exam Well-developed male in no acute distress Nose without purulence or discharge noted Neck without lymphadenopathy or thyromegaly Chest with decreased breath sounds, no active wheezing Cardiac exam with regular rate and rhythm Lower extremities without edema, no cyanosis Alert and oriented, moves all 4 extremities.       Assessment & Plan:

## 2014-10-27 NOTE — Patient Instructions (Signed)
Continue on symbicort 2 puffs am and pm everyday. Rinse mouth well. Keep your albuterol handy for rescue if needed. Stop smoking.  This is the most important part of your treatment  followup with me again in 71mos.

## 2014-10-31 ENCOUNTER — Telehealth: Payer: Self-pay | Admitting: Endocrinology

## 2014-10-31 MED ORDER — ALLOPURINOL 300 MG PO TABS: 300.0000 mg | ORAL_TABLET | Freq: Every day | ORAL | Status: DC

## 2014-10-31 NOTE — Telephone Encounter (Signed)
Please refill x 3 months cpx < 3 months.

## 2014-10-31 NOTE — Telephone Encounter (Signed)
See below and please advise if ok to refill. Pt was last seen on 08/26/2013. Thanks!

## 2014-10-31 NOTE — Telephone Encounter (Signed)
Rx sent to pharmacy   

## 2014-10-31 NOTE — Telephone Encounter (Signed)
Allopurinol can we call in enough out hold him until his appt

## 2014-11-04 ENCOUNTER — Other Ambulatory Visit: Payer: Self-pay | Admitting: Endocrinology

## 2014-11-08 ENCOUNTER — Other Ambulatory Visit: Payer: Self-pay | Admitting: Endocrinology

## 2014-11-10 NOTE — Telephone Encounter (Signed)
Rx sent to pharamacy.  

## 2014-11-10 NOTE — Telephone Encounter (Signed)
Please advise if ok to refill rx. Pt was last seen on 08/26/2013. Thanks!

## 2014-11-10 NOTE — Telephone Encounter (Signed)
Please refill x 1 cpx is due 

## 2014-11-14 ENCOUNTER — Encounter: Payer: Self-pay | Admitting: Endocrinology

## 2014-11-14 ENCOUNTER — Ambulatory Visit (INDEPENDENT_AMBULATORY_CARE_PROVIDER_SITE_OTHER): Payer: BC Managed Care – PPO | Admitting: Endocrinology

## 2014-11-14 VITALS — BP 138/70 | HR 82 | Temp 98.3°F | Ht 73.0 in | Wt 175.0 lb

## 2014-11-14 DIAGNOSIS — Z0000 Encounter for general adult medical examination without abnormal findings: Secondary | ICD-10-CM

## 2014-11-14 DIAGNOSIS — E119 Type 2 diabetes mellitus without complications: Secondary | ICD-10-CM

## 2014-11-14 DIAGNOSIS — M109 Gout, unspecified: Secondary | ICD-10-CM

## 2014-11-14 DIAGNOSIS — R972 Elevated prostate specific antigen [PSA]: Secondary | ICD-10-CM

## 2014-11-14 DIAGNOSIS — Z0189 Encounter for other specified special examinations: Secondary | ICD-10-CM

## 2014-11-14 NOTE — Progress Notes (Signed)
we discussed code status.  pt requests full code, but would not want to be started or maintained on artificial life-support measures if there was not a reasonable chance of recovery 

## 2014-11-14 NOTE — Patient Instructions (Addendum)
please consider these measures for your health:  minimize alcohol.  do not use tobacco products.  have a colonoscopy at least every 10 years from age 73.   keep firearms safely stored.  always use seat belts.  have working smoke alarms in your home.  see an eye doctor and dentist regularly.  never drive under the influence of alcohol or drugs (including prescription drugs).   please let me know what your wishes would be, if artificial life support measures should become necessary.  it is critically important to prevent falling down (keep floor areas well-lit, dry, and free of loose objects.  If you have a cane, walker, or wheelchair, you should use it, even for short trips around the house.  Also, try not to rush). Blood and urine tests are being requested for you today.  We'll let you know about the results.   Please come back for a follow-up appointment in 6 months.

## 2014-11-14 NOTE — Progress Notes (Signed)
Subjective:    Patient ID: Ryan Contreras, male    DOB: [birth year], 73 y.o.   MRN: [id number]  HPI Pt is here for regular wellness examination, and is feeling pretty well in general, and says chronic med probs are stable, except as noted below.   Past Medical History  Diagnosis Date  . CAD, NATIVE VESSEL   . Gout, unspecified   . HYPERLIPIDEMIA   . HYPERTENSION   . PEPTIC ULCER DISEASE   . TOBACCO ABUSE   . Type 2 diabetes mellitus     Past Surgical History  Procedure Laterality Date  . Coronary stent placement      s/p    History   Social History  . Marital Status: Single    Spouse Name: N/A    Number of Children: 2  . 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 Smoking status: Current Every Day Smoker -- 0.50 packs/day for 50 years    Types: Cigarettes  . Smokeless tobacco: Not on file  . Alcohol Use: 0.0 oz/week    0 Not specified per week     Comment: 1 beer/day  . Drug Use: Not on file  . Sexual Activity: Not on file   Other Topics Concern  . Not on file   Social History Narrative   Widowed 2003    Current Outpatient Prescriptions on File Prior to Visit  Medication Sig Dispense Refill  . albuterol (PROAIR HFA) 108 (90 BASE) MCG/ACT inhaler Inhale 2 puffs into the lungs every 6 (six) hours as needed for wheezing or shortness of breath. 1 Inhaler 3  . allopurinol (ZYLOPRIM) 300 MG tablet Take 1 tablet (300 mg total) by mouth daily. 30 tablet 2  . aspirin 81 MG tablet Take 81 mg by mouth daily.      . clopidogrel (PLAVIX) 75 MG tablet TAKE 1 TABLET BY MOUTH EVERY DAY 30 tablet 2  . CRESTOR 40 MG tablet TAKE 1 TABLET BY MOUTH EVERY DAY 30 tablet 0  . glucose blood (ACCU-CHEK COMPACT STRIPS) test strip Once daily, dx 250.00     . losartan-hydrochlorothiazide (HYZAAR) 100-25 MG per tablet TAKE 1 TABLET EVERY DAY 30 tablet 1  . losartan-hydrochlorothiazide (HYZAAR) 100-25 MG per tablet TAKE 1 TABLET  BY MOUTH EVERY DAY 30 tablet 0  . methocarbamol (ROBAXIN) 500 MG tablet 1 tablet at bedtime as needed for cramps    . potassium chloride (KLOR-CON M10) 10 MEQ tablet Take 1 tablet by mouth daily. APPOINTMENT NEEDED FOR FURTHER REFILLS. 30 tablet 0  . sildenafil (VIAGRA) 100 MG tablet Take 1 tablet (100 mg total) by mouth as needed for erectile dysfunction. 1 tablet 4  . SYMBICORT 160-4.5 MCG/ACT inhaler INHALE 2 PUFFS BY MOUTH TWICE A DAY 1 Inhaler 3  . [DISCONTINUED] potassium chloride (KLOR-CON 10) 10 MEQ tablet Take 1 tablet (10 mEq total) by mouth daily. 30 tablet 4   No current facility-administered medications on file prior to visit.    No Known Allergies  Family History  Problem Relation Age of Onset  . Cancer Sister     Uncertain type  . Cancer Brother     Uncertain type    BP 425/95 mmHg  Pulse 82  Temp(Src) 98.3 F (36.8 C) (Oral)  Ht 6\' 1"  (1.854 m)  Wt 175 lb (79.379 kg)  BMI 23.09 kg/m2  SpO2 92%    Review of Systems  Constitutional: Negative  for fever and unexpected weight change.  HENT: Negative for hearing loss.   Eyes: Negative for visual disturbance.  Respiratory: Negative for shortness of breath.   Cardiovascular: Negative for chest pain.  Endocrine: Negative for cold intolerance.  Musculoskeletal: Negative for back pain.  Skin: Negative for rash.  Allergic/Immunologic: Negative for environmental allergies.  Neurological: Negative for syncope and headaches.  Hematological: Does not bruise/bleed easily.  Psychiatric/Behavioral: Negative for dysphoric mood.       Objective:   Physical Exam VS: see vs page GEN: no distress HEAD: head: no deformity eyes: no periorbital swelling, no proptosis external nose and ears are normal mouth: no lesion seen NECK: supple, thyroid is not enlarged CHEST WALL: no deformity LUNGS: clear to auscultation BREASTS:  No gynecomastia CV: reg rate and rhythm, no murmur ABD: abdomen is soft, nontender.  no  hepatosplenomegaly.  not distended.  no hernia GENITALIA/RECTAL/PROSTATE: sees urology MUSCULOSKELETAL: muscle bulk and strength are grossly normal.  no obvious joint swelling.  gait is normal and steady EXTEMITIES: no deformity.  no ulcer on the feet.  feet are of normal color and temp.  no edema PULSES: dorsalis pedis intact bilat.  no carotid bruit NEURO:  cn 2-12 grossly intact.   readily moves all 4's.  sensation is intact to touch on the feet SKIN:  Normal texture and temperature.  No rash or suspicious lesion is visible.   NODES:  None palpable at the neck. PSYCH: alert, well-oriented.  Does not appear anxious nor depressed.        Assessment & Plan:  Wellness visit today, with problems stable, except as noted.   Patient is advised the following: Patient Instructions  please consider these measures for your health:  minimize alcohol.  do not use tobacco products.  have a colonoscopy at least every 10 years from age 32.   keep firearms safely stored.  always use seat belts.  have working smoke alarms in your home.  see an eye doctor and dentist regularly.  never drive under the influence of alcohol or drugs (including prescription drugs).   please let me know what your wishes would be, if artificial life support measures should become necessary.  it is critically important to prevent falling down (keep floor areas well-lit, dry, and free of loose objects.  If you have a cane, walker, or wheelchair, you should use it, even for short trips around the house.  Also, try not to rush). Blood and urine tests are being requested for you today.  We'll let you know about the results.   Please come back for a follow-up appointment in 6 months.  '

## 2014-11-16 LAB — CBC WITH DIFFERENTIAL/PLATELET
Basophils Absolute: 0 10*3/uL (ref 0.0–0.1)
Basophils Relative: 0.6 % (ref 0.0–3.0)
EOS ABS: 0.2 10*3/uL (ref 0.0–0.7)
EOS PCT: 2.2 % (ref 0.0–5.0)
HEMATOCRIT: 46.1 % (ref 39.0–52.0)
Hemoglobin: 14.7 g/dL (ref 13.0–17.0)
LYMPHS ABS: 2.5 10*3/uL (ref 0.7–4.0)
Lymphocytes Relative: 28.3 % (ref 12.0–46.0)
MCHC: 32 g/dL (ref 30.0–36.0)
MCV: 98.6 fl (ref 78.0–100.0)
MONO ABS: 1.4 10*3/uL — AB (ref 0.1–1.0)
Monocytes Relative: 16.2 % — ABNORMAL HIGH (ref 3.0–12.0)
NEUTROS PCT: 52.7 % (ref 43.0–77.0)
Neutro Abs: 4.7 10*3/uL (ref 1.4–7.7)
PLATELETS: 273 10*3/uL (ref 150.0–400.0)
RBC: 4.68 Mil/uL (ref 4.22–5.81)
RDW: 16.7 % — ABNORMAL HIGH (ref 11.5–15.5)
WBC: 8.9 10*3/uL (ref 4.0–10.5)

## 2014-11-16 LAB — MICROALBUMIN / CREATININE URINE RATIO
Creatinine,U: 111 mg/dL
Microalb Creat Ratio: 0.9 mg/g (ref 0.0–30.0)
Microalb, Ur: 1 mg/dL (ref 0.0–1.9)

## 2014-11-16 LAB — URINALYSIS, ROUTINE W REFLEX MICROSCOPIC
BILIRUBIN URINE: NEGATIVE
HGB URINE DIPSTICK: NEGATIVE
Ketones, ur: NEGATIVE
Leukocytes, UA: NEGATIVE
NITRITE: NEGATIVE
RBC / HPF: NONE SEEN (ref 0–?)
Specific Gravity, Urine: 1.02 (ref 1.000–1.030)
TOTAL PROTEIN, URINE-UPE24: NEGATIVE
Urine Glucose: NEGATIVE
pH: 6 (ref 5.0–8.0)

## 2014-11-16 LAB — HEPATIC FUNCTION PANEL
ALT: 19 U/L (ref 0–53)
AST: 26 U/L (ref 0–37)
Albumin: 4.1 g/dL (ref 3.5–5.2)
Alkaline Phosphatase: 79 U/L (ref 39–117)
BILIRUBIN DIRECT: 0 mg/dL (ref 0.0–0.3)
BILIRUBIN TOTAL: 1 mg/dL (ref 0.2–1.2)
Total Protein: 7.1 g/dL (ref 6.0–8.3)

## 2014-11-16 LAB — LIPID PANEL
CHOL/HDL RATIO: 2
Cholesterol: 155 mg/dL (ref 0–200)
HDL: 77.4 mg/dL (ref >39.00)
LDL CALC: 68 mg/dL (ref 0–99)
NonHDL: 77.6
TRIGLYCERIDES: 46 mg/dL (ref 0.0–149.0)
VLDL: 9.2 mg/dL (ref 0.0–40.0)

## 2014-11-16 LAB — BASIC METABOLIC PANEL
BUN: 14 mg/dL (ref 6–23)
CHLORIDE: 99 meq/L (ref 96–112)
CO2: 30 meq/L (ref 19–32)
Calcium: 9.1 mg/dL (ref 8.4–10.5)
Creatinine, Ser: 0.7 mg/dL (ref 0.4–1.5)
GFR: 134.1 mL/min (ref >60.00)
GLUCOSE: 104 mg/dL — AB (ref 70–99)
Potassium: 4.2 mEq/L (ref 3.5–5.1)
Sodium: 140 mEq/L (ref 135–145)

## 2014-11-16 LAB — TSH: TSH: 0.77 u[IU]/mL (ref 0.35–4.50)

## 2014-11-16 LAB — URIC ACID: Uric Acid, Serum: 4.8 mg/dL (ref 4.0–7.8)

## 2014-11-16 LAB — PSA: PSA: 7.07 ng/mL — ABNORMAL HIGH (ref 0.10–4.00)

## 2014-11-16 LAB — HEMOGLOBIN A1C: Hgb A1c MFr Bld: 6.9 % — ABNORMAL HIGH (ref 4.6–6.5)

## 2014-11-17 ENCOUNTER — Telehealth: Payer: Self-pay | Admitting: Endocrinology

## 2014-11-17 NOTE — Telephone Encounter (Signed)
Patient would like to know if his lab results are in    Please advise    Thank You

## 2014-11-17 NOTE — Telephone Encounter (Signed)
Pt advised of lab work. Psa is lower - good. A1C was good no medication needed. Pt is to return for follow up visit in 6 months. Pt voiced understanding.

## 2014-11-24 ENCOUNTER — Other Ambulatory Visit: Payer: Self-pay | Admitting: Endocrinology

## 2014-11-25 NOTE — Telephone Encounter (Signed)
Patient stated that he has called the billing dept, he is call for you to set up him and appt

## 2014-11-25 NOTE — Telephone Encounter (Signed)
Contacted pt. He stated he had a backed up bill at [facility] urology and had to pay it. Pt states he contacted [facility] and has it worked out where he will pay the day of his office visit. [facility] contacted and advised ok to schedule pt for office visit.

## 2014-12-10 ENCOUNTER — Other Ambulatory Visit: Payer: Self-pay | Admitting: Endocrinology

## 2014-12-26 ENCOUNTER — Ambulatory Visit: Payer: BC Managed Care – PPO | Admitting: Internal Medicine

## 2015-01-01 ENCOUNTER — Other Ambulatory Visit: Payer: Self-pay | Admitting: Endocrinology

## 2015-01-01 DIAGNOSIS — R3915 Urgency of urination: Secondary | ICD-10-CM | POA: Diagnosis not present

## 2015-01-01 DIAGNOSIS — R972 Elevated prostate specific antigen [PSA]: Secondary | ICD-10-CM | POA: Diagnosis not present

## 2015-01-01 DIAGNOSIS — N401 Enlarged prostate with lower urinary tract symptoms: Secondary | ICD-10-CM | POA: Diagnosis not present

## 2015-01-01 DIAGNOSIS — N5201 Erectile dysfunction due to arterial insufficiency: Secondary | ICD-10-CM | POA: Diagnosis not present

## 2015-01-03 ENCOUNTER — Other Ambulatory Visit: Payer: Self-pay | Admitting: Endocrinology

## 2015-01-06 DIAGNOSIS — Z0279 Encounter for issue of other medical certificate: Secondary | ICD-10-CM

## 2015-01-08 ENCOUNTER — Telehealth: Payer: Self-pay | Admitting: Endocrinology

## 2015-01-08 NOTE — Telephone Encounter (Signed)
Patient would like to know if his transportation paper are ready. Please advise

## 2015-01-08 NOTE — Telephone Encounter (Signed)
Contacted pt and advised we have completed paper work and faxed to [facility] on 01/06/2015.

## 2015-01-16 NOTE — Progress Notes (Signed)
Cardiology Office Note   Date:  01/23/2015   ID:  Ryan Contreras, DOB 06-13-1941, MRN [id number]  PCP:  Renato Shin, MD  Cardiologist:   Dorris Carnes, MD  Patinet is a 74 yo who I follow for CAD and CP      History of Present Illness: Ryan Contreras is a 74 y.o. male with a history of  CAD (last intervention was PTCA/stent to RCA for instent restenosis). Also a history of HTN, HL, tobacco use, COPD. I  Last cath:  LAD: normal proximally, mid vessel has a hypodense area with moderate 60% stenosis. Difficult to isolate from D1 take off. Looks similar to last cath in 2006 Distal vessel is normal  D1: 40% ostial  Circumflex: 40% mid vessel disease  OM1: large branching artery mid circumflex lesion involves take off of OM 40%  RCA: Widely patent stent in proximal vessel 40% mid vessel disease normal distal vessel  PDA: normal PLA: normal  Ventriculography: EF: 55 %, minimsl inferobasal hypokinesis  Plan was for medical therapy as lesion did not appear flow limiting  Since I saw him in March 2015 he has done fairly well  No CP  Breathing is stable  Continues to smoke a little        Current Outpatient Prescriptions  Medication Sig Dispense Refill  . albuterol (PROAIR HFA) 108 (90 BASE) MCG/ACT inhaler Inhale 2 puffs into the lungs every 6 (six) hours as needed for wheezing or shortness of breath. 1 Inhaler 3  . allopurinol (ZYLOPRIM) 300 MG tablet Take 1 tablet (300 mg total) by mouth daily. 30 tablet 2  . aspirin 81 MG tablet Take 81 mg by mouth daily.      . clopidogrel (PLAVIX) 75 MG tablet TAKE 1 TABLET BY MOUTH EVERY DAY 30 tablet 2  . CRESTOR 40 MG tablet TAKE 1 TABLET BY MOUTH EVERY DAY 30 tablet 0  . glucose blood (ACCU-CHEK COMPACT STRIPS) test strip Once daily, dx 250.00     . KLOR-CON M10 10 MEQ tablet TAKE 1 TABLET BY MOUTH DAILY. APPOINTMENT NEEDED FOR FURTHER REFILLS. 30 tablet 3  . losartan-hydrochlorothiazide (HYZAAR) 100-25 MG per tablet TAKE 1 TABLET BY  MOUTH EVERY DAY 30 tablet 0  . methocarbamol (ROBAXIN) 500 MG tablet 1 tablet at bedtime as needed for cramps    . sildenafil (VIAGRA) 100 MG tablet Take 1 tablet (100 mg total) by mouth as needed for erectile dysfunction. 1 tablet 4  . SYMBICORT 160-4.5 MCG/ACT inhaler INHALE 2 PUFFS BY MOUTH TWICE A DAY 1 Inhaler 3  . tamsulosin (FLOMAX) 0.4 MG CAPS capsule Take 0.4 mg by mouth daily.  11  . [DISCONTINUED] potassium chloride (KLOR-CON 10) 10 MEQ tablet Take 1 tablet (10 mEq total) by mouth daily. 30 tablet 4   No current facility-administered medications for this visit.    Allergies:   Review of patient's allergies indicates no known allergies.   Past Medical History  Diagnosis Date  . CAD, NATIVE VESSEL   . Gout, unspecified   . HYPERLIPIDEMIA   . HYPERTENSION   . PEPTIC ULCER DISEASE   . TOBACCO ABUSE   . Type 2 diabetes mellitus     Past Surgical History  Procedure Laterality Date  . Coronary stent placement      s/p     Social History:  The patient  reports that he has been smoking Cigarettes.  He has a 25 pack-year smoking history. He does not have any smokeless tobacco  history on file. He reports that he drinks alcohol.   Family History:  The patient's family history includes Cancer in his brother and sister.    ROS:  Please see the history of present illness. All other systems are reviewed and  Negative to the above problem except as noted.    PHYSICAL EXAM: VS:  BP 140/70 mmHg  Pulse 89  Ht 6\' 1"  (1.854 m)  Wt 183 lb 9.6 oz (83.28 kg)  BMI 24.23 kg/m2  SpO2 99%  GEN: Well nourished, well developed, in no acute distress HEENT: normalPoor dentition   Neck: no JVD Right carotid bruit  NO masses Cardiac: RRR; no murmurs, rubs, or gallops,no edema  Respiratory:  clear to auscultation bilaterally, normal work of breathing GI: soft, nontender, nondistended, + BS  No hepatomegaly  MS: no deformity Moving all extremities   Skin: warm and dry, no rash Neuro:   Strength and sensation are intact Psych: euthymic mood, full affect   EKG:  No EKG ordered    Lipid Panel    Component Value Date/Time   CHOL 155 11/14/2014 1015   TRIG 46.0 11/14/2014 1015   TRIG 36 11/28/2006 0837   HDL 77.40 11/14/2014 1015   CHOLHDL 2 11/14/2014 1015   CHOLHDL 2.0 CALC 11/28/2006 0837   VLDL 9.2 11/14/2014 1015   LDLCALC 68 11/14/2014 1015      Wt Readings from Last 3 Encounters:  01/23/15 183 lb 9.6 oz (83.28 kg)  11/14/14 175 lb (79.379 kg)  10/27/14 182 lb 12.8 oz (82.918 kg)      ASSESSMENT AND PLAN: 1  CAD  No symptoms of angina  Keep on same regimen   2  HL  Labs in Dec were good  But he is having a problem with copah for crestor  Will switch to 80 lipitor and check lipids in May  3.   HTN  Adequate control  4.  COPD  Continues to smoke a little  Counselled.    5.  Carotid bruit  Will set up for USN     Current medicines are reviewed at length with the patient today.  The patient has concerns regarding medicines as noted    The following changes have been made:  Change to lipitor   Labs/ tests ordered today include:     Disposition:   FU with me 1 year   Signed, Dorris Carnes, MD  01/23/2015 8:16 AM    [facility] [address], [place], [place]  [address] Phone: [phone]; Fax: [phone]

## 2015-01-19 ENCOUNTER — Ambulatory Visit: Payer: BC Managed Care – PPO | Admitting: Internal Medicine

## 2015-01-23 ENCOUNTER — Ambulatory Visit (INDEPENDENT_AMBULATORY_CARE_PROVIDER_SITE_OTHER): Payer: BC Managed Care – PPO | Admitting: Internal Medicine

## 2015-01-23 ENCOUNTER — Encounter: Payer: Self-pay | Admitting: Internal Medicine

## 2015-01-23 VITALS — BP 140/70 | HR 89 | Ht 73.0 in | Wt 183.6 lb

## 2015-01-23 DIAGNOSIS — R0989 Other specified symptoms and signs involving the circulatory and respiratory systems: Secondary | ICD-10-CM | POA: Diagnosis not present

## 2015-01-23 DIAGNOSIS — E785 Hyperlipidemia, unspecified: Secondary | ICD-10-CM | POA: Diagnosis not present

## 2015-01-23 MED ORDER — ATORVASTATIN CALCIUM 80 MG PO TABS: 80.0000 mg | ORAL_TABLET | Freq: Every day | ORAL | Status: DC

## 2015-01-23 NOTE — Patient Instructions (Addendum)
Your physician has recommended you make the following change in your medication:  1.) STOP CRESTOR 2.) START LIPITOR (ATORVASTATIN) 80 MG ONCE DAILY  Your physician has requested that you have a carotid duplex. This test is an ultrasound of the carotid arteries in your neck. It looks at blood flow through these arteries that supply the brain with blood. Allow one hour for this exam. There are no restrictions or special instructions.  Your physician recommends that you return for lab work in: [place] ([place])  Your physician wants you to follow-up in: [place].  You will receive a reminder letter in the mail two months in advance. If you don't receive a letter, please call our office to schedule the follow-up appointment.

## 2015-01-24 DIAGNOSIS — M272 Inflammatory conditions of jaws: Secondary | ICD-10-CM | POA: Diagnosis not present

## 2015-01-24 DIAGNOSIS — Z6823 Body mass index (BMI) 23.0-23.9, adult: Secondary | ICD-10-CM | POA: Diagnosis not present

## 2015-01-24 DIAGNOSIS — E780 Pure hypercholesterolemia: Secondary | ICD-10-CM | POA: Diagnosis not present

## 2015-01-24 DIAGNOSIS — L03211 Cellulitis of face: Secondary | ICD-10-CM | POA: Diagnosis not present

## 2015-01-24 DIAGNOSIS — F17210 Nicotine dependence, cigarettes, uncomplicated: Secondary | ICD-10-CM | POA: Diagnosis not present

## 2015-01-24 DIAGNOSIS — I10 Essential (primary) hypertension: Secondary | ICD-10-CM | POA: Diagnosis not present

## 2015-01-26 ENCOUNTER — Ambulatory Visit: Payer: BC Managed Care – PPO | Admitting: Internal Medicine

## 2015-01-29 ENCOUNTER — Other Ambulatory Visit: Payer: Self-pay | Admitting: Internal Medicine

## 2015-01-30 ENCOUNTER — Ambulatory Visit (INDEPENDENT_AMBULATORY_CARE_PROVIDER_SITE_OTHER): Payer: BC Managed Care – PPO | Admitting: Internal Medicine

## 2015-01-30 ENCOUNTER — Encounter: Payer: Self-pay | Admitting: Internal Medicine

## 2015-01-30 VITALS — BP 130/80 | HR 91 | Temp 98.7°F | Wt 183.0 lb

## 2015-01-30 DIAGNOSIS — B079 Viral wart, unspecified: Secondary | ICD-10-CM | POA: Diagnosis not present

## 2015-01-30 NOTE — Patient Instructions (Signed)
Postprocedure instructions :     Keep the wounds clean. You can wash them with liquid soap and water. Pat dry with gauze or a Kleenex tissue  Before applying antibiotic ointment   You need to report immediately  if  any signs of infection develop.    

## 2015-01-30 NOTE — Assessment & Plan Note (Signed)
See procedure 

## 2015-01-30 NOTE — Progress Notes (Signed)
   Procedure Note :     Procedure : Cryosurgery   Indication:  Wart(s) on face   Risks including unsuccessful procedure , bleeding, infection, bruising, scar, a need for a repeat  procedure and others were explained to the patient in detail as well as the benefits. Informed consent was obtained verbally.   16  lesion(s) on face was/were treated with liquid nitrogen on a Q-tip in a usual fasion . Band-Aid was applied and antibiotic ointment was given for a later use.   Tolerated well. Complications none.   Postprocedure instructions :     Keep the wounds clean. You can wash them with liquid soap and water. Pat dry with gauze or a Kleenex tissue  Before applying antibiotic ointment and a Band-Aid.   You need to report immediately  if  any signs of infection develop.

## 2015-01-30 NOTE — Progress Notes (Signed)
Pre visit review using our clinic review tool, if applicable. No additional management support is needed unless otherwise documented below in the visit note. 

## 2015-01-31 ENCOUNTER — Encounter: Payer: Self-pay | Admitting: Internal Medicine

## 2015-01-31 ENCOUNTER — Telehealth: Payer: Self-pay | Admitting: Endocrinology

## 2015-01-31 NOTE — Telephone Encounter (Signed)
emmi mailed  °

## 2015-02-02 ENCOUNTER — Other Ambulatory Visit: Payer: Self-pay | Admitting: Endocrinology

## 2015-02-02 NOTE — Telephone Encounter (Signed)
Please advise if ok to refill rx. Medication is not listed on current list.  Thanks!

## 2015-02-16 ENCOUNTER — Other Ambulatory Visit: Payer: Self-pay | Admitting: Pulmonary Disease

## 2015-02-20 ENCOUNTER — Other Ambulatory Visit: Payer: Self-pay | Admitting: Endocrinology

## 2015-02-22 ENCOUNTER — Other Ambulatory Visit: Payer: Self-pay | Admitting: Endocrinology

## 2015-02-23 ENCOUNTER — Other Ambulatory Visit: Payer: Self-pay | Admitting: *Deleted

## 2015-02-23 MED ORDER — LOSARTAN POTASSIUM-HCTZ 100-25 MG PO TABS: 1.0000 | ORAL_TABLET | Freq: Every day | ORAL | Status: DC

## 2015-02-24 ENCOUNTER — Telehealth: Payer: Self-pay | Admitting: Internal Medicine

## 2015-02-24 NOTE — Telephone Encounter (Signed)
New message     Pt c/o medication issue:  1. Name of Medication: atovastatin 2. How are you currently taking this medication (dosage and times per day)? 80mg  daily 3. Are you having a reaction (difficulty breathing--STAT)? no 4. What is your medication issue? Pt was taking crestor 40mg  and now he is on atovastatin 80mg .  Pt want to know why is he taking a higher milligram medication?

## 2015-02-24 NOTE — Telephone Encounter (Signed)
I spoke with the patient and made him aware that the Crestor 40 mg is equivalent to atorvastatin 80 mg. He just started this yesterday as he just finished the crestor tablets. He will notify us if her starts to develop any joint/ muscle aches/ pains.

## 2015-03-18 ENCOUNTER — Other Ambulatory Visit: Payer: Self-pay | Admitting: Pulmonary Disease

## 2015-04-03 ENCOUNTER — Encounter (HOSPITAL_COMMUNITY): Payer: BC Managed Care – PPO

## 2015-04-03 ENCOUNTER — Other Ambulatory Visit: Payer: BC Managed Care – PPO

## 2015-04-10 ENCOUNTER — Other Ambulatory Visit (INDEPENDENT_AMBULATORY_CARE_PROVIDER_SITE_OTHER): Payer: BC Managed Care – PPO | Admitting: *Deleted

## 2015-04-10 ENCOUNTER — Ambulatory Visit (HOSPITAL_COMMUNITY): Payer: BC Managed Care – PPO | Attending: Cardiovascular Disease

## 2015-04-10 DIAGNOSIS — R0989 Other specified symptoms and signs involving the circulatory and respiratory systems: Secondary | ICD-10-CM | POA: Insufficient documentation

## 2015-04-10 DIAGNOSIS — E785 Hyperlipidemia, unspecified: Secondary | ICD-10-CM | POA: Diagnosis not present

## 2015-04-10 DIAGNOSIS — I6523 Occlusion and stenosis of bilateral carotid arteries: Secondary | ICD-10-CM

## 2015-04-10 LAB — LIPID PANEL
CHOLESTEROL: 122 mg/dL (ref 0–200)
HDL: 54.5 mg/dL (ref >39.00)
LDL Cholesterol: 57 mg/dL (ref 0–99)
NonHDL: 67.5
Total CHOL/HDL Ratio: 2
Triglycerides: 51 mg/dL (ref 0.0–149.0)
VLDL: 10.2 mg/dL (ref 0.0–40.0)

## 2015-04-10 NOTE — Progress Notes (Signed)
Carotid duplex performed 

## 2015-04-13 ENCOUNTER — Telehealth: Payer: Self-pay | Admitting: Internal Medicine

## 2015-04-13 ENCOUNTER — Ambulatory Visit (INDEPENDENT_AMBULATORY_CARE_PROVIDER_SITE_OTHER): Payer: BC Managed Care – PPO | Admitting: Endocrinology

## 2015-04-13 ENCOUNTER — Ambulatory Visit
Admission: RE | Admit: 2015-04-13 | Discharge: 2015-04-13 | Disposition: A | Discharge status: Home / Self Care | Payer: Medicare Other | Source: Ambulatory Visit | Attending: Endocrinology | Admitting: Endocrinology

## 2015-04-13 ENCOUNTER — Encounter: Payer: Self-pay | Admitting: Endocrinology

## 2015-04-13 VITALS — BP 138/70 | HR 91 | Temp 97.9°F | Wt 182.0 lb

## 2015-04-13 DIAGNOSIS — R972 Elevated prostate specific antigen [PSA]: Secondary | ICD-10-CM

## 2015-04-13 DIAGNOSIS — E876 Hypokalemia: Secondary | ICD-10-CM | POA: Diagnosis not present

## 2015-04-13 DIAGNOSIS — E119 Type 2 diabetes mellitus without complications: Secondary | ICD-10-CM | POA: Diagnosis not present

## 2015-04-13 DIAGNOSIS — E1159 Type 2 diabetes mellitus with other circulatory complications: Secondary | ICD-10-CM | POA: Diagnosis not present

## 2015-04-13 DIAGNOSIS — R05 Cough: Secondary | ICD-10-CM

## 2015-04-13 DIAGNOSIS — R059 Cough, unspecified: Secondary | ICD-10-CM

## 2015-04-13 LAB — BASIC METABOLIC PANEL
BUN: 11 mg/dL (ref 6–23)
CO2: 33 mEq/L — ABNORMAL HIGH (ref 19–32)
CREATININE: 0.69 mg/dL (ref 0.40–1.50)
Calcium: 8.9 mg/dL (ref 8.4–10.5)
Chloride: 99 mEq/L (ref 96–112)
GFR: 145.2 mL/min (ref >60.00)
GLUCOSE: 103 mg/dL — AB (ref 70–99)
Potassium: 3.9 mEq/L (ref 3.5–5.1)
Sodium: 139 mEq/L (ref 135–145)

## 2015-04-13 LAB — PSA: PSA: 7.81 ng/mL — AB (ref 0.10–4.00)

## 2015-04-13 LAB — HEMOGLOBIN A1C: Hgb A1c MFr Bld: 6.4 % (ref 4.6–6.5)

## 2015-04-13 NOTE — Telephone Encounter (Signed)
New message ° ° ° ° °Want lab results °

## 2015-04-13 NOTE — Patient Instructions (Signed)
blood tests are requested for you today.  We'll let you know about the results. Please come back for a regular physical appointment in 7 months (must be after 11/15/15).       Smoking Cessation Quitting smoking is important to your health and has many advantages. However, it is not always easy to quit since nicotine is a very addictive drug. Oftentimes, people try 3 times or more before being able to quit. This document explains the best ways for you to prepare to quit smoking. Quitting takes hard work and a lot of effort, but you can do it. ADVANTAGES OF QUITTING SMOKING  You will live longer, feel better, and live better.  Your body will feel the impact of quitting smoking almost immediately.  Within 20 minutes, blood pressure decreases. Your pulse returns to its normal level.  After 8 hours, carbon monoxide levels in the blood return to normal. Your oxygen level increases.  After 24 hours, the chance of having a heart attack starts to decrease. Your breath, hair, and body stop smelling like smoke.  After 48 hours, damaged nerve endings begin to recover. Your sense of taste and smell improve.  After 72 hours, the body is virtually free of nicotine. Your bronchial tubes relax and breathing becomes easier.  After 2 to 12 weeks, lungs can hold more air. Exercise becomes easier and circulation improves.  The risk of having a heart attack, stroke, cancer, or lung disease is greatly reduced.  After 1 year, the risk of coronary heart disease is cut in half.  After 5 years, the risk of stroke falls to the same as a nonsmoker.  After 10 years, the risk of lung cancer is cut in half and the risk of other cancers decreases significantly.  After 15 years, the risk of coronary heart disease drops, usually to the level of a nonsmoker.  If you are pregnant, quitting smoking will improve your chances of having a healthy baby.  The people you live with, especially any children, will be  healthier.  You will have extra money to spend on things other than cigarettes. QUESTIONS TO THINK ABOUT BEFORE ATTEMPTING TO QUIT You may want to talk about your answers with your health care provider.  Why do you want to quit?  If you tried to quit in the past, what helped and what did not?  What will be the most difficult situations for you after you quit? How will you plan to handle them?  Who can help you through the tough times? Your family? Friends? A health care provider?  What pleasures do you get from smoking? What ways can you still get pleasure if you quit? Here are some questions to ask your health care provider:  How can you help me to be successful at quitting?  What medicine do you think would be best for me and how should I take it?  What should I do if I need more help?  What is smoking withdrawal like? How can I get information on withdrawal? GET READY  Set a quit date.  Change your environment by getting rid of all cigarettes, ashtrays, matches, and lighters in your home, car, or work. Do not let people smoke in your home.  Review your past attempts to quit. Think about what worked and what did not. GET SUPPORT AND ENCOURAGEMENT You have a better chance of being successful if you have help. You can get support in many ways.  Tell your family, friends, and coworkers  that you are going to quit and need their support. Ask them not to smoke around you.  Get individual, group, or telephone counseling and support. Programs are available at [employer] and health centers. Call your local health department for information about programs in your area.  Spiritual beliefs and practices may help some smokers quit.  Download a "quit meter" on your computer to keep track of quit statistics, such as how long you have gone without smoking, cigarettes not smoked, and money saved.  Get a self-help book about quitting smoking and staying off tobacco. [place] yourself from urges to smoke. Talk to someone, go for a walk, or occupy your time with a task.  Change your normal routine. Take a different route to work. Drink tea instead of coffee. Eat breakfast in a different place.  Reduce your stress. Take a hot bath, exercise, or read a book.  Plan something enjoyable to do every day. Reward yourself for not smoking.  Explore interactive web-based programs that specialize in helping you quit. GET MEDICINE AND USE IT CORRECTLY Medicines can help you stop smoking and decrease the urge to smoke. Combining medicine with the above behavioral methods and support can greatly increase your chances of successfully quitting smoking.  Nicotine replacement therapy helps deliver nicotine to your body without the negative effects and risks of smoking. Nicotine replacement therapy includes nicotine gum, lozenges, inhalers, nasal sprays, and skin patches. Some may be available over-the-counter and others require a prescription.  Antidepressant medicine helps people abstain from smoking, but how this works is unknown. This medicine is available by prescription.  Nicotinic receptor partial agonist medicine simulates the effect of nicotine in your brain. This medicine is available by prescription. Ask your health care provider for advice about which medicines to use and how to use them based on your health history. Your health care provider will tell you what side effects to look out for if you choose to be on a medicine or therapy. Carefully read the information on the package. Do not use any other product containing nicotine while using a nicotine replacement product.  RELAPSE OR DIFFICULT SITUATIONS Most relapses occur within the first 3 months after quitting. Do not be discouraged if you start smoking again. Remember, most people try several times before finally quitting. You may have symptoms of withdrawal because your body is used to nicotine.  You may crave cigarettes, be irritable, feel very hungry, cough often, get headaches, or have difficulty concentrating. The withdrawal symptoms are only temporary. They are strongest when you first quit, but they will go away within 10-14 days. To reduce the chances of relapse, try to:  Avoid drinking alcohol. Drinking lowers your chances of successfully quitting.  Reduce the amount of caffeine you consume. Once you quit smoking, the amount of caffeine in your body increases and can give you symptoms, such as a rapid heartbeat, sweating, and anxiety.  Avoid smokers because they can make you want to smoke.  Do not let weight gain distract you. Many smokers will gain weight when they quit, usually less than 10 pounds. Eat a healthy diet and stay active. You can always lose the weight gained after you quit.  Find ways to improve your mood other than smoking. FOR MORE INFORMATION  www.smokefree.gov  Document Released: 11/22/2001 Document Revised: 04/14/2014 Document Reviewed: 03/08/2012 [facility] Patient Information 2015 [place], [place]. This information is not intended to replace advice given to you by your health care  provider. Make sure you discuss any questions you have with your health care provider.

## 2015-04-13 NOTE — Progress Notes (Signed)
Subjective:    Patient ID: Ryan Contreras, male    DOB: July 25, 1941, 74 y.o.   MRN: [id number]  HPI  The state of at least three ongoing medical problems is addressed today, with interval history of each noted here: Hypokalemia: he has fatigue.  Dyslipidemia: he denies weight change.  Elevated PSA: he denies decreased urinary stream.  Past Medical History  Diagnosis Date  . CAD, NATIVE VESSEL   . Gout, unspecified   . HYPERLIPIDEMIA   . HYPERTENSION   . PEPTIC ULCER DISEASE   . TOBACCO ABUSE   . Type 2 diabetes mellitus     Past Surgical History  Procedure Laterality Date  . Coronary stent placement      s/p    History   Social History  . Marital Status: Single    Spouse Name: N/A  . Number of Children: 2  . 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 Smoking status: Current Every Day Smoker -- 0.50 packs/day for 50 years    Types: Cigarettes  . Smokeless tobacco: Not on file  . Alcohol Use: 0.0 oz/week    0 Standard drinks or equivalent per week     Comment: 1 beer/day  . Drug Use: Not on file  . Sexual Activity: Not on file   Other Topics Concern  . Not on file   Social History Narrative   Widowed 2003    Current Outpatient Prescriptions on File Prior to Visit  Medication Sig Dispense Refill  . allopurinol (ZYLOPRIM) 300 MG tablet TAKE 1 TABLET (300 MG TOTAL) BY MOUTH DAILY. 30 tablet 2  . aspirin 81 MG tablet Take 81 mg by mouth daily.      Marland Kitchen atorvastatin (LIPITOR) 80 MG tablet Take 1 tablet (80 mg total) by mouth daily. 30 tablet 11  . clopidogrel (PLAVIX) 75 MG tablet TAKE 1 TABLET BY MOUTH EVERY DAY 30 tablet 10  . glucose blood (ACCU-CHEK COMPACT STRIPS) test strip Once daily, dx 250.00     . KLOR-CON M10 10 MEQ tablet TAKE 1 TABLET BY MOUTH DAILY. APPOINTMENT NEEDED FOR FURTHER REFILLS. 30 tablet 3  . losartan-hydrochlorothiazide (HYZAAR) 100-25 MG per tablet Take 1 tablet by mouth daily.  30 tablet 3  . methocarbamol (ROBAXIN) 500 MG tablet 1 tablet at bedtime as needed for cramps    . PROAIR HFA 108 (90 BASE) MCG/ACT inhaler INHALE 2 PUFFS INTO THE LUNGS EVERY 6 (SIX) HOURS AS NEEDED FOR WHEEZING OR SHORTNESS OF BREATH. 8.5 Inhaler 3  . sildenafil (VIAGRA) 100 MG tablet Take 1 tablet (100 mg total) by mouth as needed for erectile dysfunction. 1 tablet 4  . SYMBICORT 160-4.5 MCG/ACT inhaler INHALE 2 PUFFS BY MOUTH TWICE A DAY 10.2 Inhaler 3  . tamsulosin (FLOMAX) 0.4 MG CAPS capsule Take 0.4 mg by mouth daily.  11  . [DISCONTINUED] potassium chloride (KLOR-CON 10) 10 MEQ tablet Take 1 tablet (10 mEq total) by mouth daily. 30 tablet 4   No current facility-administered medications on file prior to visit.    No Known Allergies  Family History  Problem Relation Age of Onset  . Cancer Sister     Uncertain type  . Cancer Brother     Uncertain type    BP 001/74 mmHg  Pulse 91  Temp(Src) 97.9 F (36.6 C) (Oral)  Wt 182 lb (82.555 kg)  SpO2 91%     Review of Systems  Denies chest pain and sob.  He has an intermittent cough    Objective:   Physical Exam VITAL SIGNS:  See vs page GENERAL: no distress LUNGS:  Clear to auscultation Pulses: dorsalis pedis intact bilat.   MSK: no deformity of the feet CV: no leg edema Skin:  no ulcer on the feet.  normal color and temp on the feet. Neuro: sensation is intact to touch on the feet.   Ext: There is bilateral onychomycosis of the toenails.    Lab Results  Component Value Date   WBC 8.9 11/14/2014   HGB 14.7 11/14/2014   HCT 46.1 11/14/2014   PLT 273.0 11/14/2014   GLUCOSE 103* 04/13/2015   CHOL 122 04/10/2015   TRIG 51.0 04/10/2015   HDL 54.50 04/10/2015   LDLCALC 57 04/10/2015   ALT 19 11/14/2014   AST 26 11/14/2014   NA 139 04/13/2015   K 3.9 04/13/2015   CL 99 04/13/2015   CREATININE 0.69 04/13/2015   BUN 11 04/13/2015   CO2 33* 04/13/2015   TSH 0.77 11/14/2014   PSA 7.81* 04/13/2015   INR 0.9  06/05/2012   HGBA1C 6.4 04/13/2015   MICROALBUR 1.0 11/14/2014       Assessment & Plan:  Smoker: Pt says he has tried quit-smoking meds, and declines further rx Elevated PSA, stable Dyslipidemia: well-controlled Hypokalemia: well-replaced  Please continue the same medications.   Patient is advised the following: Patient Instructions  blood tests are requested for you today.  We'll let you know about the results. Please come back for a regular physical appointment in 7 months (must be after 11/15/15).       Smoking Cessation Quitting smoking is important to your health and has many advantages. However, it is not always easy to quit since nicotine is a very addictive drug. Oftentimes, people try 3 times or more before being able to quit. This document explains the best ways for you to prepare to quit smoking. Quitting takes hard work and a lot of effort, but you can do it. ADVANTAGES OF QUITTING SMOKING  You will live longer, feel better, and live better.  Your body will feel the impact of quitting smoking almost immediately.  Within 20 minutes, blood pressure decreases. Your pulse returns to its normal level.  After 8 hours, carbon monoxide levels in the blood return to normal. Your oxygen level increases.  After 24 hours, the chance of having a heart attack starts to decrease. Your breath, hair, and body stop smelling like smoke.  After 48 hours, damaged nerve endings begin to recover. Your sense of taste and smell improve.  After 72 hours, the body is virtually free of nicotine. Your bronchial tubes relax and breathing becomes easier.  After 2 to 12 weeks, lungs can hold more air. Exercise becomes easier and circulation improves.  The risk of having a heart attack, stroke, cancer, or lung disease is greatly reduced.  After 1 year, the risk of coronary heart disease is cut in half.  After 5 years, the risk of stroke falls to the same as a nonsmoker.  After 10 years,  the risk of lung cancer is cut in half and the risk of other cancers decreases significantly.  After 15 years, the risk of coronary heart disease drops, usually to the level of a nonsmoker.  If you are pregnant, quitting smoking will improve your chances of having a healthy baby.  The people you live with, especially any children, will be healthier.  You  will have extra money to spend on things other than cigarettes. QUESTIONS TO THINK ABOUT BEFORE ATTEMPTING TO QUIT You may want to talk about your answers with your health care provider.  Why do you want to quit?  If you tried to quit in the past, what helped and what did not?  What will be the most difficult situations for you after you quit? How will you plan to handle them?  Who can help you through the tough times? Your family? Friends? A health care provider?  What pleasures do you get from smoking? What ways can you still get pleasure if you quit? Here are some questions to ask your health care provider:  How can you help me to be successful at quitting?  What medicine do you think would be best for me and how should I take it?  What should I do if I need more help?  What is smoking withdrawal like? How can I get information on withdrawal? GET READY  Set a quit date.  Change your environment by getting rid of all cigarettes, ashtrays, matches, and lighters in your home, car, or work. Do not let people smoke in your home.  Review your past attempts to quit. Think about what worked and what did not. GET SUPPORT AND ENCOURAGEMENT You have a better chance of being successful if you have help. You can get support in many ways.  Tell your family, friends, and coworkers that you are going to quit and need their support. Ask them not to smoke around you.  Get individual, group, or telephone counseling and support. Programs are available at [employer] and health centers. Call your local health department for information  about programs in your area.  Spiritual beliefs and practices may help some smokers quit.  Download a "quit meter" on your computer to keep track of quit statistics, such as how long you have gone without smoking, cigarettes not smoked, and money saved.  Get a self-help book about quitting smoking and staying off tobacco. [place] yourself from urges to smoke. Talk to someone, go for a walk, or occupy your time with a task.  Change your normal routine. Take a different route to work. Drink tea instead of coffee. Eat breakfast in a different place.  Reduce your stress. Take a hot bath, exercise, or read a book.  Plan something enjoyable to do every day. Reward yourself for not smoking.  Explore interactive web-based programs that specialize in helping you quit. GET MEDICINE AND USE IT CORRECTLY Medicines can help you stop smoking and decrease the urge to smoke. Combining medicine with the above behavioral methods and support can greatly increase your chances of successfully quitting smoking.  Nicotine replacement therapy helps deliver nicotine to your body without the negative effects and risks of smoking. Nicotine replacement therapy includes nicotine gum, lozenges, inhalers, nasal sprays, and skin patches. Some may be available over-the-counter and others require a prescription.  Antidepressant medicine helps people abstain from smoking, but how this works is unknown. This medicine is available by prescription.  Nicotinic receptor partial agonist medicine simulates the effect of nicotine in your brain. This medicine is available by prescription. Ask your health care provider for advice about which medicines to use and how to use them based on your health history. Your health care provider will tell you what side effects to look out for if you choose to be on a medicine or therapy. Carefully read the information  on the package. Do not use any other product  containing nicotine while using a nicotine replacement product.  RELAPSE OR DIFFICULT SITUATIONS Most relapses occur within the first 3 months after quitting. Do not be discouraged if you start smoking again. Remember, most people try several times before finally quitting. You may have symptoms of withdrawal because your body is used to nicotine. You may crave cigarettes, be irritable, feel very hungry, cough often, get headaches, or have difficulty concentrating. The withdrawal symptoms are only temporary. They are strongest when you first quit, but they will go away within 10-14 days. To reduce the chances of relapse, try to:  Avoid drinking alcohol. Drinking lowers your chances of successfully quitting.  Reduce the amount of caffeine you consume. Once you quit smoking, the amount of caffeine in your body increases and can give you symptoms, such as a rapid heartbeat, sweating, and anxiety.  Avoid smokers because they can make you want to smoke.  Do not let weight gain distract you. Many smokers will gain weight when they quit, usually less than 10 pounds. Eat a healthy diet and stay active. You can always lose the weight gained after you quit.  Find ways to improve your mood other than smoking. FOR MORE INFORMATION  www.smokefree.gov  Document Released: 11/22/2001 Document Revised: 04/14/2014 Document Reviewed: 03/08/2012 [facility] Patient Information 2015 [place], [place]. This information is not intended to replace advice given to you by your health care provider. Make sure you discuss any questions you have with your health care provider.

## 2015-04-13 NOTE — Telephone Encounter (Signed)
Left message for patient to call back  

## 2015-04-14 NOTE — Telephone Encounter (Signed)
Lab results called to patient by Moody Bruins, LPN, at other provider office. Closing this encounter.

## 2015-04-27 ENCOUNTER — Ambulatory Visit (INDEPENDENT_AMBULATORY_CARE_PROVIDER_SITE_OTHER): Payer: BC Managed Care – PPO | Admitting: Pulmonary Disease

## 2015-04-27 ENCOUNTER — Encounter: Payer: Self-pay | Admitting: Pulmonary Disease

## 2015-04-27 VITALS — BP 120/78 | HR 77 | Temp 97.8°F | Ht 73.0 in | Wt 183.2 lb

## 2015-04-27 DIAGNOSIS — J438 Other emphysema: Secondary | ICD-10-CM

## 2015-04-27 NOTE — Patient Instructions (Signed)
Stay on symbicort everyday Work on some type of exercise Try to quit smoking.  This is the most important treatment for you followup with Dr. Lenna Gilford in 62mo.

## 2015-04-27 NOTE — Progress Notes (Signed)
   Subjective:    Patient ID: Ryan Contreras, male    DOB: 04-29-1941, 74 y.o.   MRN: [id number]  HPI Patient comes in today for follow-up of his known COPD. He is staying on his bronchodilator regimen, but unfortunately continues to smoke. He feels that his exertional tolerance is at baseline, and has not had an acute exacerbation since the last visit. He does continue to have cough and nonpurulent mucus, but I have explained this is directly related to his smoking.   Review of Systems  Constitutional: Negative for fever and unexpected weight change.  HENT: Negative for congestion, dental problem, ear pain, nosebleeds, postnasal drip, rhinorrhea, sinus pressure, sneezing, sore throat and trouble swallowing.   Eyes: Negative for redness and itching.  Respiratory: Positive for cough and wheezing. Negative for chest tightness and shortness of breath.   Cardiovascular: Negative for palpitations and leg swelling.  Gastrointestinal: Negative for nausea and vomiting.  Genitourinary: Negative for dysuria.  Musculoskeletal: Negative for joint swelling.  Skin: Negative for rash.  Neurological: Negative for headaches.  Hematological: Does not bruise/bleed easily.  Psychiatric/Behavioral: Negative for dysphoric mood. The patient is not nervous/anxious.        Objective:   Physical Exam Overweight male in no acute distress Nose without purulence or discharge noted Neck without lymphadenopathy or thyromegaly Chest with decreased breath sounds, no active wheezing Cardiac exam with regular rate and rhythm Lower extremities without edema, no cyanosis Alert and oriented, moves all 4 extremities.       Assessment & Plan:

## 2015-04-27 NOTE — Assessment & Plan Note (Signed)
The patient appears to be at his usual baseline from the last visit, and denies any recent acute exacerbation. He is staying on Symbicort, as well as as needed albuterol. Unfortunately, he continues to smoke, and I have stressed to him the importance of total smoking cessation. I have asked him to stay as active as possible, and to follow-up again in 6 months.

## 2015-05-15 ENCOUNTER — Other Ambulatory Visit: Payer: Self-pay | Admitting: Endocrinology

## 2015-06-10 ENCOUNTER — Other Ambulatory Visit: Payer: Self-pay | Admitting: Endocrinology

## 2015-07-10 ENCOUNTER — Telehealth: Payer: Self-pay | Admitting: Pulmonary Disease

## 2015-07-10 NOTE — Telephone Encounter (Signed)
Called and left detailed message on voicemail advising patient that Sample is at front to pick up. Patient called back and was advised by [facility] that sample is up front. Nothing further needed.

## 2015-07-24 DIAGNOSIS — I10 Essential (primary) hypertension: Secondary | ICD-10-CM | POA: Diagnosis not present

## 2015-07-24 DIAGNOSIS — M109 Gout, unspecified: Secondary | ICD-10-CM | POA: Diagnosis not present

## 2015-07-24 DIAGNOSIS — Z6823 Body mass index (BMI) 23.0-23.9, adult: Secondary | ICD-10-CM | POA: Diagnosis not present

## 2015-07-24 DIAGNOSIS — I252 Old myocardial infarction: Secondary | ICD-10-CM | POA: Diagnosis not present

## 2015-07-24 DIAGNOSIS — K047 Periapical abscess without sinus: Secondary | ICD-10-CM | POA: Diagnosis not present

## 2015-07-24 DIAGNOSIS — R1310 Dysphagia, unspecified: Secondary | ICD-10-CM | POA: Diagnosis not present

## 2015-07-24 DIAGNOSIS — Z955 Presence of coronary angioplasty implant and graft: Secondary | ICD-10-CM | POA: Diagnosis not present

## 2015-07-24 DIAGNOSIS — F17210 Nicotine dependence, cigarettes, uncomplicated: Secondary | ICD-10-CM | POA: Diagnosis not present

## 2015-07-24 DIAGNOSIS — R221 Localized swelling, mass and lump, neck: Secondary | ICD-10-CM | POA: Diagnosis not present

## 2015-08-14 ENCOUNTER — Ambulatory Visit (INDEPENDENT_AMBULATORY_CARE_PROVIDER_SITE_OTHER): Payer: BC Managed Care – PPO | Admitting: Endocrinology

## 2015-08-14 ENCOUNTER — Encounter: Payer: Self-pay | Admitting: Endocrinology

## 2015-08-14 VITALS — BP 134/70 | HR 97 | Temp 98.2°F | Ht 73.0 in | Wt 185.0 lb

## 2015-08-14 DIAGNOSIS — R609 Edema, unspecified: Secondary | ICD-10-CM | POA: Diagnosis not present

## 2015-08-14 DIAGNOSIS — E1159 Type 2 diabetes mellitus with other circulatory complications: Secondary | ICD-10-CM | POA: Diagnosis not present

## 2015-08-14 LAB — URINALYSIS, ROUTINE W REFLEX MICROSCOPIC
BILIRUBIN URINE: NEGATIVE
Ketones, ur: NEGATIVE
Leukocytes, UA: NEGATIVE
Nitrite: NEGATIVE
PH: 7.5 (ref 5.0–8.0)
SPECIFIC GRAVITY, URINE: 1.015 (ref 1.000–1.030)
Total Protein, Urine: NEGATIVE
Urine Glucose: NEGATIVE
Urobilinogen, UA: 1 (ref 0.0–1.0)

## 2015-08-14 LAB — BASIC METABOLIC PANEL
BUN: 13 mg/dL (ref 6–23)
CO2: 39 mEq/L — ABNORMAL HIGH (ref 19–32)
CREATININE: 0.73 mg/dL (ref 0.40–1.50)
Calcium: 8.9 mg/dL (ref 8.4–10.5)
Chloride: 98 mEq/L (ref 96–112)
GFR: 135.93 mL/min (ref >60.00)
Glucose, Bld: 96 mg/dL (ref 70–99)
Potassium: 3.7 mEq/L (ref 3.5–5.1)
Sodium: 143 mEq/L (ref 135–145)

## 2015-08-14 LAB — BRAIN NATRIURETIC PEPTIDE: PRO B NATRI PEPTIDE: 141 pg/mL — AB (ref 0.0–100.0)

## 2015-08-14 LAB — POCT GLYCOSYLATED HEMOGLOBIN (HGB A1C): Hemoglobin A1C: 6.4

## 2015-08-14 MED ORDER — FUROSEMIDE 20 MG PO TABS: 20.0000 mg | ORAL_TABLET | Freq: Every day | ORAL | Status: DC

## 2015-08-14 MED ORDER — POTASSIUM CHLORIDE ER 10 MEQ PO TBCR: 10.0000 meq | EXTENDED_RELEASE_TABLET | Freq: Every day | ORAL | Status: DC

## 2015-08-14 NOTE — Progress Notes (Signed)
Subjective:    Patient ID: Ryan Contreras, male    DOB: 02-Aug-1941, 74 y.o.   MRN: [id number]  HPI Pt states 1 week of slight swelling of the legs, but no assoc pain.   Past Medical History  Diagnosis Date  . CAD, NATIVE VESSEL   . Gout, unspecified   . HYPERLIPIDEMIA   . HYPERTENSION   . PEPTIC ULCER DISEASE   . TOBACCO ABUSE   . Type 2 diabetes mellitus     Past Surgical History  Procedure Laterality Date  . Coronary stent placement      s/p    Social History   Social History  . Marital Status: Single    Spouse Name: N/A  . Number of Children: 2  . 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 Smoking status: Current Every Day Smoker -- 0.50 packs/day for 50 years    Types: Cigarettes  . Smokeless tobacco: Not on file  . Alcohol Use: 0.0 oz/week    0 Standard drinks or equivalent per week     Comment: 1 beer/day  . Drug Use: Not on file  . Sexual Activity: Not on file   Other Topics Concern  . Not on file   Social History Narrative   Widowed 2003    Current Outpatient Prescriptions on File Prior to Visit  Medication Sig Dispense Refill  . allopurinol (ZYLOPRIM) 300 MG tablet TAKE 1 TABLET BY MOUTH EVERY DAY 30 tablet 2  . aspirin 81 MG tablet Take 81 mg by mouth daily.      Marland Kitchen atorvastatin (LIPITOR) 80 MG tablet Take 1 tablet (80 mg total) by mouth daily. 30 tablet 11  . clopidogrel (PLAVIX) 75 MG tablet TAKE 1 TABLET BY MOUTH EVERY DAY 30 tablet 10  . glucose blood (ACCU-CHEK COMPACT STRIPS) test strip Once daily, dx 250.00     . KLOR-CON M10 10 MEQ tablet TAKE 1 TABLET BY MOUTH DAILY. APPOINTMENT NEEDED FOR FURTHER REFILLS. 30 tablet 3  . losartan-hydrochlorothiazide (HYZAAR) 100-25 MG per tablet Take 1 tablet by mouth daily. 30 tablet 3  . methocarbamol (ROBAXIN) 500 MG tablet 1 tablet at bedtime as needed for cramps    . PROAIR HFA 108 (90 BASE) MCG/ACT inhaler INHALE 2 PUFFS INTO THE LUNGS  EVERY 6 (SIX) HOURS AS NEEDED FOR WHEEZING OR SHORTNESS OF BREATH. 8.5 Inhaler 3  . sildenafil (VIAGRA) 100 MG tablet Take 1 tablet (100 mg total) by mouth as needed for erectile dysfunction. 1 tablet 4  . SYMBICORT 160-4.5 MCG/ACT inhaler INHALE 2 PUFFS BY MOUTH TWICE A DAY 10.2 Inhaler 3  . tamsulosin (FLOMAX) 0.4 MG CAPS capsule Take 0.4 mg by mouth daily.  11   No current facility-administered medications on file prior to visit.    No Known Allergies  Family History  Problem Relation Age of Onset  . Cancer Sister     Uncertain type  . Cancer Brother     Uncertain type    BP 081/44 mmHg  Pulse 97  Temp(Src) 98.2 F (36.8 C) (Oral)  Ht '6\' 1"'$  (1.854 m)  Wt 185 lb (83.915 kg)  BMI 24.41 kg/m2  SpO2 91%    Review of Systems Denies sob and chest pain    Objective:   Physical Exam VITAL SIGNS:  See vs page GENERAL: no distress LUNGS:  Clear to auscultation HEART:  Regular rate and rhythm without murmurs noted. Normal  S1,S2.   Ext: 1+ bilat leg edema  A1c=6.4% i personally reviewed electrocardiogram tracing (today): Indication: edema Impression: Right atrial enlargement: possible pulmonary disease.     Assessment & Plan:  DM: well-controlled Edema, new, prob due to right sided failure.  Patient is advised the following: Patient Instructions  blood and urine tests, and a chest x-ray are requested for you today.  We'll let you know about the results.  I hope you feel better soon.  If you don't feel better by next week, please call back.  Please call sooner if you get worse.    addendum: i have sent a prescription to your pharmacy, for lasix

## 2015-08-14 NOTE — Patient Instructions (Addendum)
blood and urine tests, and a chest x-ray are requested for you today.  We'll let you know about the results.  I hope you feel better soon.  If you don't feel better by next week, please call back.  Please call sooner if you get worse.

## 2015-08-17 ENCOUNTER — Other Ambulatory Visit: Payer: Self-pay | Admitting: Endocrinology

## 2015-08-18 ENCOUNTER — Telehealth: Payer: Self-pay | Admitting: Endocrinology

## 2015-08-18 NOTE — Telephone Encounter (Signed)
I contacted the pt and advised of results from 08/14/2015. Please see result for further documentation.

## 2015-08-18 NOTE — Telephone Encounter (Signed)
Patient is calling for the results of his lab work.

## 2015-09-11 ENCOUNTER — Encounter: Payer: Self-pay | Admitting: Endocrinology

## 2015-09-11 ENCOUNTER — Ambulatory Visit: Payer: Medicare Other | Admitting: Endocrinology

## 2015-09-11 ENCOUNTER — Ambulatory Visit (INDEPENDENT_AMBULATORY_CARE_PROVIDER_SITE_OTHER): Payer: BC Managed Care – PPO | Admitting: Endocrinology

## 2015-09-11 VITALS — BP 128/68 | HR 97 | Temp 98.6°F | Ht 73.0 in | Wt 182.0 lb

## 2015-09-11 DIAGNOSIS — R609 Edema, unspecified: Secondary | ICD-10-CM

## 2015-09-11 DIAGNOSIS — R0600 Dyspnea, unspecified: Secondary | ICD-10-CM

## 2015-09-11 LAB — BASIC METABOLIC PANEL
BUN: 17 mg/dL (ref 6–23)
CHLORIDE: 95 meq/L — AB (ref 96–112)
CO2: 42 meq/L — AB (ref 19–32)
Calcium: 8.5 mg/dL (ref 8.4–10.5)
Creatinine, Ser: 0.84 mg/dL (ref 0.40–1.50)
GFR: 115.58 mL/min (ref >60.00)
GLUCOSE: 104 mg/dL — AB (ref 70–99)
POTASSIUM: 3.7 meq/L (ref 3.5–5.1)
SODIUM: 141 meq/L (ref 135–145)

## 2015-09-11 LAB — BRAIN NATRIURETIC PEPTIDE: PRO B NATRI PEPTIDE: 216 pg/mL — AB (ref 0.0–100.0)

## 2015-09-11 NOTE — Patient Instructions (Signed)
blood tests are requested for you today.  We'll let you know about the results. If we can, we'll increase the fluid pill.   Please come back for a regular physical appointment in 2-3 months (must be after 11/15/15).

## 2015-09-11 NOTE — Progress Notes (Signed)
Subjective:    Patient ID: Ryan Contreras, male    DOB: January 02, 1941, 74 y.o.   MRN: [id number]  HPI  The state of at least three ongoing medical problems is addressed today, with interval history of each noted here: Edema: pt says persists Hypokalemia: he gets good diuretic effect from the lasix. COPD: no change in chronic sob. Past Medical History  Diagnosis Date  . CAD, NATIVE VESSEL   . Gout, unspecified   . HYPERLIPIDEMIA   . HYPERTENSION   . PEPTIC ULCER DISEASE   . TOBACCO ABUSE   . Type 2 diabetes mellitus     Past Surgical History  Procedure Laterality Date  . Coronary stent placement      s/p    Social History   Social History  . Marital Status: Single    Spouse Name: N/A  . Number of Children: 2  . 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 Smoking status: Current Every Day Smoker -- 0.50 packs/day for 50 years    Types: Cigarettes  . Smokeless tobacco: Not on file  . Alcohol Use: 0.0 oz/week    0 Standard drinks or equivalent per week     Comment: 1 beer/day  . Drug Use: Not on file  . Sexual Activity: Not on file   Other Topics Concern  . Not on file   Social History Narrative   Widowed 2003    Current Outpatient Prescriptions on File Prior to Visit  Medication Sig Dispense Refill  . allopurinol (ZYLOPRIM) 300 MG tablet TAKE 1 TABLET BY MOUTH EVERY DAY 30 tablet 2  . aspirin 81 MG tablet Take 81 mg by mouth daily.      Marland Kitchen atorvastatin (LIPITOR) 80 MG tablet Take 1 tablet (80 mg total) by mouth daily. 30 tablet 11  . clopidogrel (PLAVIX) 75 MG tablet TAKE 1 TABLET BY MOUTH EVERY DAY 30 tablet 10  . furosemide (LASIX) 20 MG tablet Take 1 tablet (20 mg total) by mouth daily. 30 tablet 3  . glucose blood (ACCU-CHEK COMPACT STRIPS) test strip Once daily, dx 250.00     . KLOR-CON M10 10 MEQ tablet TAKE 1 TABLET BY MOUTH DAILY. APPOINTMENT NEEDED FOR FURTHER REFILLS. 30 tablet 3  .  losartan-hydrochlorothiazide (HYZAAR) 100-25 MG per tablet TAKE 1 TABLET BY MOUTH EVERY DAY 30 tablet 3  . methocarbamol (ROBAXIN) 500 MG tablet 1 tablet at bedtime as needed for cramps    . potassium chloride (K-DUR) 10 MEQ tablet Take 1 tablet (10 mEq total) by mouth daily. 30 tablet 11  . PROAIR HFA 108 (90 BASE) MCG/ACT inhaler INHALE 2 PUFFS INTO THE LUNGS EVERY 6 (SIX) HOURS AS NEEDED FOR WHEEZING OR SHORTNESS OF BREATH. 8.5 Inhaler 3  . sildenafil (VIAGRA) 100 MG tablet Take 1 tablet (100 mg total) by mouth as needed for erectile dysfunction. 1 tablet 4  . SYMBICORT 160-4.5 MCG/ACT inhaler INHALE 2 PUFFS BY MOUTH TWICE A DAY 10.2 Inhaler 3  . tamsulosin (FLOMAX) 0.4 MG CAPS capsule Take 0.4 mg by mouth daily.  11   No current facility-administered medications on file prior to visit.    No Known Allergies  Family History  Problem Relation Age of Onset  . Cancer Sister     Uncertain type  . Cancer Brother     Uncertain type    BP 194/17 mmHg  Pulse 97  Temp(Src) 98.6 F (37 C) (  Oral)  Ht '6\' 1"'$  (1.854 m)  Wt 182 lb (82.555 kg)  BMI 24.02 kg/m2  SpO2 92%  Review of Systems Denies leg pain and chest pain.    Objective:   Physical Exam VITAL SIGNS:  See vs page GENERAL: no distress LUNGS:  Clear to auscultation Ext: 2+ bilat leg edema   Lab Results  Component Value Date   CREATININE 0.84 09/11/2015   BUN 17 09/11/2015   NA 141 09/11/2015   K 3.7 09/11/2015   CL 95* 09/11/2015   CO2 42* 09/11/2015   BNP=216    Assessment & Plan:  Edema, perssitent, prob due to cor pulmonale.  Sob, chronic: most likely due to copd.  Please continue the same rx.  Hypokalemia: well-replaced. Metabolic alkalosis: due to copd and diuretic rx  Patient is advised the following: Patient Instructions  blood tests are requested for you today.  We'll let you know about the results. If we can, we'll increase the fluid pill.   Please come back for a regular physical appointment in 2-3  months (must be after 11/15/15).   addendum: Please continue the same medications.  Ref cardiol for f/u

## 2015-10-06 ENCOUNTER — Other Ambulatory Visit: Payer: Self-pay | Admitting: *Deleted

## 2015-10-06 MED ORDER — BUDESONIDE-FORMOTEROL FUMARATE 160-4.5 MCG/ACT IN AERO: 2.0000 | INHALATION_SPRAY | Freq: Two times a day (BID) | RESPIRATORY_TRACT | Status: DC

## 2015-10-11 NOTE — Progress Notes (Signed)
Cardiology Office Note   Date:  10/12/2015   ID:  Ryan Contreras, DOB August 28, 1941, MRN [id number]  PCP:  Renato Shin, MD  Cardiologist:   Dorris Carnes, MD   No chief complaint on file.     History of Present Illness: Ryan Contreras is a 74 y.o. male with a history ofCAD (last intervention was PTCA/stent to RCA for instent restenosis). Also a history of HTN, HL, tobacco use, COPD. I  Last cath:  LAD: normal proximally, mid vessel has a hypodense area with moderate 60% stenosis. Difficult to isolate from D1 take off. Looks similar to last cath in 2006 Distal vessel is normal  D1: 40% ostial  Circumflex: 40% mid vessel disease  OM1: large branching artery mid circumflex lesion involves take off of OM 40%  RCA: Widely patent stent in proximal vessel 40% mid vessel disease normal distal vessel  PDA: normal PLA: normal  Ventriculography: EF: 55 %, minimsl inferobasal hypokinesis  Plan was for medical therapy as lesion did not appear flow limiting  I saw pt in Feb 2016     Clear cough  Some wheezing  Still tobacco    Current Outpatient Prescriptions  Medication Sig Dispense Refill  . allopurinol (ZYLOPRIM) 300 MG tablet TAKE 1 TABLET BY MOUTH EVERY DAY 30 tablet 2  . aspirin 81 MG tablet Take 81 mg by mouth daily.      Marland Kitchen atorvastatin (LIPITOR) 80 MG tablet Take 1 tablet (80 mg total) by mouth daily. 30 tablet 11  . budesonide-formoterol (SYMBICORT) 160-4.5 MCG/ACT inhaler Inhale 2 puffs into the lungs 2 (two) times daily. 10.2 Inhaler 2  . clopidogrel (PLAVIX) 75 MG tablet TAKE 1 TABLET BY MOUTH EVERY DAY 30 tablet 10  . furosemide (LASIX) 20 MG tablet Take 1 tablet (20 mg total) by mouth daily. 30 tablet 3  . losartan-hydrochlorothiazide (HYZAAR) 100-25 MG per tablet TAKE 1 TABLET BY MOUTH EVERY DAY 30 tablet 3  . methocarbamol (ROBAXIN) 500 MG tablet 1 tablet at bedtime as needed for cramps    . potassium chloride (K-DUR) 10 MEQ tablet Take 1 tablet (10 mEq total) by  mouth daily. 30 tablet 11  . PROAIR HFA 108 (90 BASE) MCG/ACT inhaler INHALE 2 PUFFS INTO THE LUNGS EVERY 6 (SIX) HOURS AS NEEDED FOR WHEEZING OR SHORTNESS OF BREATH. 8.5 Inhaler 3  . sildenafil (VIAGRA) 100 MG tablet Take 1 tablet (100 mg total) by mouth as needed for erectile dysfunction. 1 tablet 4  . tamsulosin (FLOMAX) 0.4 MG CAPS capsule Take 0.4 mg by mouth daily.  11   No current facility-administered medications for this visit.    Allergies:   Review of patient's allergies indicates no known allergies.   Past Medical History  Diagnosis Date  . CAD, NATIVE VESSEL   . Gout, unspecified   . HYPERLIPIDEMIA   . HYPERTENSION   . PEPTIC ULCER DISEASE   . TOBACCO ABUSE   . Type 2 diabetes mellitus [facility])     Past Surgical History  Procedure Laterality Date  . Coronary stent placement      s/p     Social History:  The patient  reports that he has been smoking Cigarettes.  He has a 25 pack-year smoking history. He has never used smokeless tobacco. He reports that he drinks alcohol.   Family History:  The patient's family history includes Arthritis in his mother; Cancer in his brother and sister; Hypertension in his father.    ROS:  Please see  the history of present illness. All other systems are reviewed and  Negative to the above problem except as noted.    PHYSICAL EXAM: VS:  BP 112/64 mmHg  Pulse 95  Ht '6\' 1"'$  (1.854 m)  Wt 184 lb 6.4 oz (83.643 kg)  BMI 24.33 kg/m2  SpO2 91%  GEN: Well nourished, well developed, in no acute distress HEENT: normal Neck: no JVD, carotid bruits, or masses Cardiac: RRR; no murmurs, rubs, or gallops,no edema  Respiratory:  clear to auscultation bilaterally, normal work of breathing GI: soft, nontender, nondistended, + BS  No hepatomegaly  MS: no deformity Moving all extremities   Skin: warm and dry, no rash Neuro:  Strength and sensation are intact Psych: euthymic mood, full affect   EKG:  EKG is  not ordered today.   Lipid  Panel    Component Value Date/Time   CHOL 122 04/10/2015 0804   TRIG 51.0 04/10/2015 0804   TRIG 36 11/28/2006 0837   HDL 54.50 04/10/2015 0804   CHOLHDL 2 04/10/2015 0804   CHOLHDL 2.0 CALC 11/28/2006 0837   VLDL 10.2 04/10/2015 0804   LDLCALC 57 04/10/2015 0804      Wt Readings from Last 3 Encounters:  10/12/15 184 lb 6.4 oz (83.643 kg)  09/11/15 182 lb (82.555 kg)  08/14/15 185 lb (83.915 kg)      ASSESSMENT AND PLAN:  1  CAD   I am not convinced of angina    2.  HL  Excellent  control  3.  HTN  Good   4  Tobacco  Still smoking some  Counselled on   Says he lacks get up and go  Will check CBC and TSH  No recent change    Otherwise f/u in 6 months     Signed, Dorris Carnes, MD  10/12/2015 9:32 AM    [address], [place], [place]  [address] Phone: [phone]; Fax: [phone]

## 2015-10-12 ENCOUNTER — Encounter: Payer: Self-pay | Admitting: Internal Medicine

## 2015-10-12 ENCOUNTER — Ambulatory Visit (INDEPENDENT_AMBULATORY_CARE_PROVIDER_SITE_OTHER): Payer: BC Managed Care – PPO | Admitting: Internal Medicine

## 2015-10-12 VITALS — BP 112/64 | HR 95 | Ht 73.0 in | Wt 184.4 lb

## 2015-10-12 DIAGNOSIS — R531 Weakness: Secondary | ICD-10-CM

## 2015-10-12 DIAGNOSIS — R5383 Other fatigue: Secondary | ICD-10-CM

## 2015-10-12 LAB — CBC WITH DIFFERENTIAL/PLATELET
BASOS PCT: 1 % (ref 0–1)
Basophils Absolute: 0.1 10*3/uL (ref 0.0–0.1)
EOS ABS: 0.2 10*3/uL (ref 0.0–0.7)
Eosinophils Relative: 2 % (ref 0–5)
HCT: 44.7 % (ref 39.0–52.0)
Hemoglobin: 14.5 g/dL (ref 13.0–17.0)
Lymphocytes Relative: 33 % (ref 12–46)
Lymphs Abs: 3 10*3/uL (ref 0.7–4.0)
MCH: 31.6 pg (ref 26.0–34.0)
MCHC: 32.4 g/dL (ref 30.0–36.0)
MCV: 97.4 fL (ref 78.0–100.0)
MONOS PCT: 18 % — AB (ref 3–12)
MPV: 9.5 fL (ref 8.6–12.4)
Monocytes Absolute: 1.6 10*3/uL — ABNORMAL HIGH (ref 0.1–1.0)
NEUTROS ABS: 4.2 10*3/uL (ref 1.7–7.7)
NEUTROS PCT: 46 % (ref 43–77)
PLATELETS: 243 10*3/uL (ref 150–400)
RBC: 4.59 MIL/uL (ref 4.22–5.81)
RDW: 15 % (ref 11.5–15.5)
WBC: 9.1 10*3/uL (ref 4.0–10.5)

## 2015-10-12 LAB — TSH: TSH: 0.743 u[IU]/mL (ref 0.350–4.500)

## 2015-10-12 NOTE — Patient Instructions (Signed)
Medication Instructions:  Your physician recommends that you continue on your current medications as directed. Please refer to the Current Medication list given to you today.   Labwork: Your physician recommends that you return for lab work TODAY (CBC, TSH)   Testing/Procedures: none  Follow-Up: Your physician wants you to follow-up in: 6 months with Dr. Harrington Challenger. You will receive a reminder letter in the mail two months in advance. If you don't receive a letter, please call our office to schedule the follow-up appointment.    Any Other Special Instructions Will Be Listed Below (If Applicable).     If you need a refill on your cardiac medications before your next appointment, please call your pharmacy.

## 2015-10-15 ENCOUNTER — Telehealth: Payer: Self-pay | Admitting: Internal Medicine

## 2015-10-15 NOTE — Telephone Encounter (Signed)
New Message   Pt callng about lab work

## 2015-10-15 NOTE — Telephone Encounter (Signed)
Informed of lab results

## 2015-11-02 ENCOUNTER — Ambulatory Visit (INDEPENDENT_AMBULATORY_CARE_PROVIDER_SITE_OTHER): Payer: BC Managed Care – PPO | Admitting: Pulmonary Disease

## 2015-11-02 ENCOUNTER — Encounter: Payer: Self-pay | Admitting: Pulmonary Disease

## 2015-11-02 VITALS — BP 126/70 | HR 95 | Temp 98.6°F | Wt 188.4 lb

## 2015-11-02 DIAGNOSIS — Z720 Tobacco use: Secondary | ICD-10-CM | POA: Diagnosis not present

## 2015-11-02 DIAGNOSIS — J449 Chronic obstructive pulmonary disease, unspecified: Secondary | ICD-10-CM

## 2015-11-02 DIAGNOSIS — E785 Hyperlipidemia, unspecified: Secondary | ICD-10-CM

## 2015-11-02 DIAGNOSIS — I10 Essential (primary) hypertension: Secondary | ICD-10-CM | POA: Diagnosis not present

## 2015-11-02 DIAGNOSIS — F17210 Nicotine dependence, cigarettes, uncomplicated: Secondary | ICD-10-CM

## 2015-11-02 DIAGNOSIS — I2510 Atherosclerotic heart disease of native coronary artery without angina pectoris: Secondary | ICD-10-CM | POA: Diagnosis not present

## 2015-11-02 MED ORDER — BUDESONIDE-FORMOTEROL FUMARATE 160-4.5 MCG/ACT IN AERO: 2.0000 | INHALATION_SPRAY | Freq: Two times a day (BID) | RESPIRATORY_TRACT | Status: DC

## 2015-11-02 MED ORDER — ALBUTEROL SULFATE HFA 108 (90 BASE) MCG/ACT IN AERS: INHALATION_SPRAY | RESPIRATORY_TRACT | Status: DC

## 2015-11-02 NOTE — Progress Notes (Signed)
Subjective:     Patient ID: Ryan Contreras, male   DOB: Apr 23, 1941, 74 y.o.   MRN: [id number]  HPI ~  10/27/14:  565moROV w/ Ryan Contreras>        The patient comes in today for follow-up of his known severe COPD. He is staying on Symbicort compliantly, but has not been able to afford Spiriva in the past. Unfortunately, he continues to smoke, and really has no intention to quit. I have talked with him further about this today. He has not had an acute exacerbation or pulmonary infection since last visit. He has mild cough with only white sputum production.      REC>  The patient has been stable since the last visit on his current bronchodilator regimen, but unfortunately continues to smoke. I have had a long discussion with him again about the importance of total smoking cessation. I've also encouraged him to work aggressively on some type of conditioning program.   - Continue on Symbicort 2 puffs am and pm everyday. Rinse mouth well. - Keep your albuterol handy for rescue if needed. - Stop smoking.This is the most important part of your treatment. - Followup with me again in 645mo ~  04/27/15:  65m20moV w/ Ryan Contreras>        Patient comes in today for follow-up of his known COPD. He is staying on his bronchodilator regimen, but unfortunately continues to smoke. He feels that his exertional tolerance is at baseline, and has not had an acute exacerbation since the last visit. He does continue to have cough and nonpurulent mucus, but I have explained this is directly related to his smoking.      REC>  The patient appears to be at his usual baseline from the last visit, and denies any recent acute exacerbation. He is staying on Symbicort, as well as as needed albuterol. Unfortunately, he continues to smoke, and I have stressed to him the importance of total smoking cessation. I have asked him to stay as active as possible, and to follow-up again in 6 months. - Stay on symbicort everyday - Work on some type of exercise - Try  to quit smoking.This is the most important treatment for you - Followup with Dr. NadLenna Contreras 65mo49mo~  November 02, 2015:  65mo 165mow/ SN>        Ryan Contreras iDayvian1 y/4 still smokes ~1/2ppd, and still works as a [employer] Contreras;  He remains on Symbicort160-2spBid and uses ProairHFA prn;  He notes mild cough, small amt of clear mucus w/o discoloration or blood; he notes SOB/DOE if her rushes or takes the stairs, level ground and ADLs are OK (no change for yrs);  He denies any recent infections...   He started smoking in his teens, he has been smoking for 55 yrs up to 1ppd, and decr to 1/2ppd recently...      EXAM shows Afeb, VSS, O2sat=93% on RA at rest;  HEENT- neg, Mallampati4;  Chest- decr BS bilat & few scat rhonchi, no w/r;  Heart- RR w/o m/r/g heard;  Abd- soft, neg;  Ext- VI, no c/c/e;  Neuro- no focal deficits...   PFT 05/20/11 showed FVC=3.52 (72%), FEV1=1.52 (46%), %1sec=43, mid-flows reduced at 19% predicted; post bronchodil there was a 7% improvement in FEV1;  TLC=6.64 (91%), RV=3.04 (113%), RV/TLC=46;  DLCO=80% predicted.  CXR 04/13/15 showed norm heart size, COPD, mild right base Atx, NAD...   LABS 9-09/2015:  [place]ms- ok x HCO3=42 indicative  of CO2 retention & renal compensation (he may benefit from Diamox);  CBC- wnl;  TSH=0.74;  BNP=216...  NOTE> he is on Hyzaar 100-25 AND Lasix20... IMP/PLAN>>  Ryan Contreras desperately needs to quit smoking- discussed Nicotine replacement options and offered Chantix;  Breathing meds refilled;  I rec that he STOP the Lasix for now & substitute ACETAZOLAMIDE '250mg'$  one tab daily in the afternoon;  He has already had the 2016 Flu vaccine;  Plan ROV in 57mosooner if needed prn...    Past Medical History  Diagnosis Date  . CAD, NATIVE VESSEL >> followed by DrRoss, CATH 05/2012= 60%LAD, 40%D1, 40%Circ, 40%RCA, EF=55% w/ min inferobasal HK... He has Hx PTA/stent in RCA   . Gout, unspecified   . HYPERLIPIDEMIA   . HYPERTENSION   . PEPTIC ULCER DISEASE   .  TOBACCO ABUSE   . Type 2 diabetes mellitus ([facility]     Past Surgical History  Procedure Laterality Date  . Coronary stent placement      s/p    Outpatient Encounter Prescriptions as of 11/02/2015  Medication Sig  . albuterol (PROAIR HFA) 108 (90 BASE) MCG/ACT inhaler INHALE 2 PUFFS INTO THE LUNGS EVERY 6 (SIX) HOURS AS NEEDED FOR WHEEZING OR SHORTNESS OF BREATH.  .Marland Kitchenallopurinol (ZYLOPRIM) 300 MG tablet TAKE 1 TABLET BY MOUTH EVERY DAY  . aspirin 81 MG tablet Take 81 mg by mouth daily.    .Marland Kitchenatorvastatin (LIPITOR) 80 MG tablet Take 1 tablet (80 mg total) by mouth daily.  . budesonide-formoterol (SYMBICORT) 160-4.5 MCG/ACT inhaler Inhale 2 puffs into the lungs 2 (two) times daily.  . clopidogrel (PLAVIX) 75 MG tablet TAKE 1 TABLET BY MOUTH EVERY DAY  . furosemide (LASIX) 20 MG tablet Take 1 tablet (20 mg total) by mouth daily.  .Marland Kitchenlosartan-hydrochlorothiazide (HYZAAR) 100-25 MG per tablet TAKE 1 TABLET BY MOUTH EVERY DAY  . methocarbamol (ROBAXIN) 500 MG tablet 1 tablet at bedtime as needed for cramps  . potassium chloride (K-DUR) 10 MEQ tablet Take 1 tablet (10 mEq total) by mouth daily.  . sildenafil (VIAGRA) 100 MG tablet Take 1 tablet (100 mg total) by mouth as needed for erectile dysfunction.  . tamsulosin (FLOMAX) 0.4 MG CAPS capsule Take 0.4 mg by mouth daily.    No Known Allergies   Immunization History  Administered Date(s) Administered  . Influenza Whole 08/11/2010, 10/10/2012  . Influenza,inj,Quad PF,36+ Mos 09/11/2013  . Influenza-Unspecified 09/11/2014, 09/14/2015  . Pneumococcal Conjugate-13 06/03/2009  . Pneumococcal Polysaccharide-23 08/11/2010  . Td 12/13/2002    Current Medications, Allergies, Past Medical History, Past Surgical History, Family History, and Social History were reviewed in [employer].   Review of Systems   Constitutional: Negative for fever and unexpected weight change.  HENT: Negative for congestion, dental  problem, ear pain, nosebleeds, postnasal drip, rhinorrhea, sinus pressure, sneezing, sore throat and trouble swallowing.  Eyes: Negative for redness and itching.  Respiratory: Positive for cough and no wheezing. Negative for chest tightness and shortness of breath.  Cardiovascular: Negative for palpitations and leg swelling.  Gastrointestinal: Negative for nausea and vomiting.  Genitourinary: Negative for dysuria.  Musculoskeletal: Negative for joint swelling.  Skin: Negative for rash.  Neurological: Negative for headaches.  Hematological: Does not bruise/bleed easily.  Psychiatric/Behavioral: Negative for dysphoric mood. The patient is not nervous/anxious.    Objective:   Physical Exam    sl overweight male in no acute distress Nose without purulence or discharge noted Neck without lymphadenopathy or thyromegaly Chest with  decreased breath sounds, no active wheezing Cardiac exam with regular rate and rhythm Lower extremities without edema, no cyanosis Alert and oriented, moves all 4 extremities.   Assessment:     IMP >>     Severe airflow obstruction w/ GOLD Stage3 COPD>  He is on Symbicort160-2spBid & ProairHFA prn; must quit smoking, stay active...     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    On-going tobacco abuse>  He continues to smoke 1/2ppd; we discussed smoking cessation options...     Cardiac>  HBP, CAD (DrRoss)    Medical>  HL, BPH w/ BOO, Elev PSA ([place]), Gout  PLAN >>     Keylin desperately needs to quit smoking- discussed Nicotine replacement options and offered Chantix;  Breathing meds refilled;  I rec that he STOP the Lasix for now & substitute ACETAZOLAMIDE '250mg'$  one tab daily in the afternoon;  He has already had the 2016 Flu vaccine;  Plan ROV in 52mosooner if needed prn.     Plan:     Patient's Medications  New  Prescriptions   ACETAZOLAMIDE (DIAMOX) 250 MG TABLET  Take 1 tablet (250 mg total) by mouth daily in the PM    Previous Medications   ALLOPURINOL (ZYLOPRIM) 300 MG TABLET    TAKE 1 TABLET BY MOUTH EVERY DAY   ASPIRIN 81 MG TABLET    Take 81 mg by mouth daily.     ATORVASTATIN (LIPITOR) 80 MG TABLET    Take 1 tablet (80 mg total) by mouth daily.   CLOPIDOGREL (PLAVIX) 75 MG TABLET    TAKE 1 TABLET BY MOUTH EVERY DAY   LOSARTAN-HYDROCHLOROTHIAZIDE (HYZAAR) 100-25 MG PER TABLET    TAKE 1 TABLET BY MOUTH EVERY DAY   METHOCARBAMOL (ROBAXIN) 500 MG TABLET    1 tablet at bedtime as needed for cramps   POTASSIUM CHLORIDE (K-DUR) 10 MEQ TABLET    Take 1 tablet (10 mEq total) by mouth daily.   SILDENAFIL (VIAGRA) 100 MG TABLET    Take 1 tablet (100 mg total) by mouth as needed for erectile dysfunction.   TAMSULOSIN (FLOMAX) 0.4 MG CAPS CAPSULE    Take 0.4 mg by mouth daily.  Modified Medications   Modified Medication Previous Medication   ALBUTEROL (PROAIR HFA) 108 (90 BASE) MCG/ACT INHALER PROAIR HFA 108 (90 BASE) MCG/ACT inhaler      INHALE 2 PUFFS INTO THE LUNGS EVERY 6 (SIX) HOURS AS NEEDED FOR WHEEZING OR SHORTNESS OF BREATH.    INHALE 2 PUFFS INTO THE LUNGS EVERY 6 (SIX) HOURS AS NEEDED FOR WHEEZING OR SHORTNESS OF BREATH.   BUDESONIDE-FORMOTEROL (SYMBICORT) 160-4.5 MCG/ACT INHALER budesonide-formoterol (SYMBICORT) 160-4.5 MCG/ACT inhaler      Inhale 2 puffs into the lungs 2 (two) times daily.    Inhale 2 puffs into the lungs 2 (two) times daily.  Discontinued Medications   No medications on file

## 2015-11-02 NOTE — Patient Instructions (Signed)
Today we updated your med list in our EPIC system...    Continue your current medications the same...    We refilled your meds per request...  Huston, you need to quit the last of those cigarettes!!!  Try the NICOTINE patch or GUM OTC...  Watch out for infection 7 call at the 1st sign of discolored phlegm etc...  For the constipation>  Try MIRALAX powder in water daily, or SENAKOT-S tabs at bedtime...  Call for any questions...  Let's plan a follow up visit in 32mo sooner if needed for problems..Marland KitchenMarland Kitchen

## 2015-11-03 ENCOUNTER — Telehealth: Payer: Self-pay | Admitting: Pulmonary Disease

## 2015-11-03 MED ORDER — ACETAZOLAMIDE 250 MG PO TABS: 250.0000 mg | ORAL_TABLET | Freq: Every day | ORAL | Status: DC

## 2015-11-03 NOTE — Telephone Encounter (Signed)
Per SN>>Call pt and inform him that SN has reviewed his labs and wants to make one medication change. Inform pt to stop taking Lasix '20mg'$  and start Diamox '250mg'$  one tab PO Q afternoon instead. Inform pt that SN has already called medication into his pharmacy and the medication will cost $10.   Called and spoke with pt and informed of SN rec and new medication change  Pt voiced understanding of new medication and instructions    Nothing further is needed at this time.

## 2015-11-13 ENCOUNTER — Encounter: Payer: BC Managed Care – PPO | Admitting: Endocrinology

## 2015-11-16 ENCOUNTER — Ambulatory Visit (INDEPENDENT_AMBULATORY_CARE_PROVIDER_SITE_OTHER): Payer: Medicare Other | Admitting: Endocrinology

## 2015-11-16 ENCOUNTER — Encounter: Payer: Self-pay | Admitting: Endocrinology

## 2015-11-16 VITALS — BP 124/63 | HR 103 | Temp 98.0°F | Ht 73.0 in | Wt 182.0 lb

## 2015-11-16 DIAGNOSIS — F17200 Nicotine dependence, unspecified, uncomplicated: Secondary | ICD-10-CM | POA: Diagnosis not present

## 2015-11-16 DIAGNOSIS — E1159 Type 2 diabetes mellitus with other circulatory complications: Secondary | ICD-10-CM | POA: Diagnosis not present

## 2015-11-16 DIAGNOSIS — Z0000 Encounter for general adult medical examination without abnormal findings: Secondary | ICD-10-CM

## 2015-11-16 LAB — POCT GLYCOSYLATED HEMOGLOBIN (HGB A1C): Hemoglobin A1C: 6.2

## 2015-11-16 NOTE — Progress Notes (Signed)
we discussed code status.  pt requests full code, but would not want to be started or maintained on artificial life-support measures if there was not a reasonable chance of recovery 

## 2015-11-16 NOTE — Progress Notes (Signed)
Subjective:    Patient ID: Ryan Contreras, male    DOB: 18-Aug-1941, 74 y.o.   MRN: [id number]  HPI Pt is here for regular wellness examination, and is feeling pretty well in general, and says chronic med probs are stable, except as noted below Past Medical History  Diagnosis Date  . CAD, NATIVE VESSEL   . Gout, unspecified   . HYPERLIPIDEMIA   . HYPERTENSION   . PEPTIC ULCER DISEASE   . TOBACCO ABUSE   . Type 2 diabetes mellitus [facility])     Past Surgical History  Procedure Laterality Date  . Coronary stent placement      s/p    Social History   Social History  . Marital Status: Single    Spouse Name: N/A  . Number of Children: 2  . 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 Smoking status: Current Every Day Smoker -- 0.50 packs/day for 50 years    Types: Cigarettes  . Smokeless tobacco: Never Used  . Alcohol Use: 0.0 oz/week    0 Standard drinks or equivalent per week     Comment: 1 beer/day  . Drug Use: Not on file  . Sexual Activity: Not on file   Other Topics Concern  . Not on file   Social History Narrative   Widowed 2003    Current Outpatient Prescriptions on File Prior to Visit  Medication Sig Dispense Refill  . acetaZOLAMIDE (DIAMOX) 250 MG tablet Take 1 tablet (250 mg total) by mouth daily. 30 tablet 2  . albuterol (PROAIR HFA) 108 (90 BASE) MCG/ACT inhaler INHALE 2 PUFFS INTO THE LUNGS EVERY 6 (SIX) HOURS AS NEEDED FOR WHEEZING OR SHORTNESS OF BREATH. 8.5 Inhaler 3  . allopurinol (ZYLOPRIM) 300 MG tablet TAKE 1 TABLET BY MOUTH EVERY DAY 30 tablet 2  . aspirin 81 MG tablet Take 81 mg by mouth daily.      Marland Kitchen atorvastatin (LIPITOR) 80 MG tablet Take 1 tablet (80 mg total) by mouth daily. 30 tablet 11  . budesonide-formoterol (SYMBICORT) 160-4.5 MCG/ACT inhaler Inhale 2 puffs into the lungs 2 (two) times daily. 10.2 Inhaler 2  . clopidogrel (PLAVIX) 75 MG tablet TAKE 1 TABLET BY MOUTH EVERY DAY  30 tablet 10  . furosemide (LASIX) 20 MG tablet Take 1 tablet (20 mg total) by mouth daily. 30 tablet 3  . losartan-hydrochlorothiazide (HYZAAR) 100-25 MG per tablet TAKE 1 TABLET BY MOUTH EVERY DAY 30 tablet 3  . methocarbamol (ROBAXIN) 500 MG tablet 1 tablet at bedtime as needed for cramps    . potassium chloride (K-DUR) 10 MEQ tablet Take 1 tablet (10 mEq total) by mouth daily. 30 tablet 11  . sildenafil (VIAGRA) 100 MG tablet Take 1 tablet (100 mg total) by mouth as needed for erectile dysfunction. 1 tablet 4  . tamsulosin (FLOMAX) 0.4 MG CAPS capsule Take 0.4 mg by mouth daily.  11   No current facility-administered medications on file prior to visit.    No Known Allergies  Family History  Problem Relation Age of Onset  . Cancer Sister     Uncertain type  . Cancer Brother     Uncertain type  . Arthritis Mother   . Hypertension Father     BP 124/63 mmHg  Pulse 103  Temp(Src) 98 F (36.7 C) (Oral)  Ht '6\' 1"'$  (1.854 m)  Wt 182 lb (82.555 kg)  BMI 24.02 kg/m2  SpO2 91%   Review of Systems  Constitutional: Positive for fatigue.  HENT: Negative for nosebleeds.   Eyes: Negative for itching.  Respiratory: Negative for shortness of breath.   Cardiovascular: Negative for chest pain.  Gastrointestinal: Negative for blood in stool.  Endocrine: Positive for cold intolerance.  Genitourinary: Negative for hematuria.  Musculoskeletal: Negative for back pain.  Skin: Negative for rash.  Allergic/Immunologic: Negative for environmental allergies.  Neurological: Negative for syncope.  Psychiatric/Behavioral: Negative for sleep disturbance.       Objective:   Physical Exam VS: see vs page GEN: no distress HEAD: head: no deformity eyes: no periorbital swelling, no proptosis external nose and ears are normal mouth: no lesion seen NECK: supple, thyroid is not enlarged CHEST WALL: no deformity LUNGS: clear to auscultation BREASTS:  No gynecomastia CV: reg rate and rhythm, no  murmur ABD: abdomen is soft, nontender.  no hepatosplenomegaly.  not distended.  no hernia.   RECTAL/PROSTATE: sees urology.   MUSCULOSKELETAL: muscle bulk and strength are grossly normal.  no obvious joint swelling.  gait is normal and steady.   EXTEMITIES: no deformity.  no ulcer on the feet.  feet are of normal color and temp.  no edema. PULSES: dorsalis pedis intact bilat.  no carotid bruit. NEURO:  cn 2-12 grossly intact.   readily moves all 4's.  sensation is intact to touch on the feet.   SKIN:  Normal texture and temperature.  No rash or suspicious lesion is visible.   NODES:  None palpable at the neck PSYCH: alert, well-oriented.  Does not appear anxious nor depressed.     Assessment & Plan:  Wellness visit today, with problems stable, except as noted.  We'll check a CT scan, due to smoking.    Subjective:   Patient here for Medicare annual wellness visit and management of other chronic and acute problems.     Risk factors: advanced age    43 of Physicians Providing Medical Care to Patient:  See "snapshot"   Activities of Daily Living: In your present state of health, do you have any difficulty performing the following activities?:  Preparing food and eating?: No  Bathing yourself: No  Getting dressed: No  Using the toilet:No  Moving around from place to place: No  In the past year have you fallen or had a near fall?:No    Home Safety: Has smoke detector and wears seat belts. Firearms are safely stored.  Diet and Exercise  Current exercise habits: pt says not very much Dietary issues discussed: pt reports a healthy diet   Depression Screen  Q1: Over the past two weeks, have you felt down, depressed or hopeless? no  Q2: Over the past two weeks, have you felt little interest or pleasure in doing things? no   The following portions of the patient's history were reviewed and updated as appropriate: allergies, current medications, past family history, past medical  history, past social history, past surgical history and problem list.   Review of Systems  Denies hearing loss, and visual loss Objective:   Vision:  Sees opthalmologist Hearing: grossly normal Body mass index:  See vs page Msk: pt easily and quickly performs "get-up-and-go" from a sitting position Cognitive Impairment Assessment: cognition, memory and judgment appear normal.  remembers 2/3 at 5 minutes.  Good recall.  can easily read and write a sentence.  alert and oriented x 3.    Assessment:   Medicare wellness utd on preventive parameters    Plan:  During the course of the visit the patient was educated and counseled about appropriate screening and preventive services including:        Fall prevention   Diabetes screening  Nutrition counseling   Vaccines / LABS Zostavax / Pneumococcal Vaccine  today  PSA is up to date  Patient Instructions (the written plan) was given to the patient.

## 2015-11-16 NOTE — Patient Instructions (Addendum)
please consider these measures for your health:  minimize alcohol.  do not use tobacco products.  have a colonoscopy at least every 10 years from age 72.   keep firearms safely stored.  always use seat belts.  have working smoke alarms in your home.  see an eye doctor and dentist regularly.  never drive under the influence of alcohol or drugs (including prescription drugs).  good diet and exercise significantly improve the control of your diabetes.  please let me know if you wish to be referred to a dietician.  high blood sugar is very risky to your health.  you should see an eye doctor and dentist every year.  It is very important to get all recommended vaccinations.  it is critically important to prevent falling down (keep floor areas well-lit, dry, and free of loose objects.  If you have a cane, walker, or wheelchair, you should use it, even for short trips around the house.  Also, try not to rush).   Please come back for a follow-up appointment in 6 months Let's check a CT scan.  you will receive a phone call, about a day and time for an appointment.

## 2015-11-26 ENCOUNTER — Other Ambulatory Visit: Payer: Self-pay | Admitting: Endocrinology

## 2015-11-26 ENCOUNTER — Ambulatory Visit
Admission: RE | Admit: 2015-11-26 | Discharge: 2015-11-26 | Disposition: A | Discharge status: Home / Self Care | Payer: BC Managed Care – PPO | Source: Ambulatory Visit | Attending: Endocrinology | Admitting: Endocrinology

## 2015-11-26 ENCOUNTER — Other Ambulatory Visit: Payer: Medicare Other

## 2015-11-26 DIAGNOSIS — Z87891 Personal history of nicotine dependence: Secondary | ICD-10-CM | POA: Diagnosis not present

## 2015-11-26 DIAGNOSIS — F17200 Nicotine dependence, unspecified, uncomplicated: Secondary | ICD-10-CM

## 2015-11-26 DIAGNOSIS — F17210 Nicotine dependence, cigarettes, uncomplicated: Secondary | ICD-10-CM | POA: Diagnosis not present

## 2015-11-26 DIAGNOSIS — R918 Other nonspecific abnormal finding of lung field: Secondary | ICD-10-CM

## 2015-11-27 ENCOUNTER — Encounter: Payer: Self-pay | Admitting: Endocrinology

## 2015-12-01 ENCOUNTER — Telehealth: Payer: Self-pay | Admitting: Acute Care

## 2015-12-01 NOTE — Telephone Encounter (Signed)
I spent 15 minutes on the phone with Ryan Contreras explaining the process we follow to work up the 4B lung nodules noted on his LDCT screening ordered by Dr. Loanne Drilling. I explained the PET scan, and what he can expect on the morning of 12/11/15. I also explained that based on how the scan looks, we will take the results to the thoracic conference on 12/17/15 for discussion with the multi-disciplinary group of physicians to determine the best plan of care for him. I explained that this could range from watching the nodules and repeating the scan in a few months, to referring him for surgery. I told him I would call him either 12/11/15, after the scan is read, or 12/15/15 based on when the results of the scan are available to me. I told him I would call him again after the thoracic conference on 12/17/15 to explain the recommendations of the team, and that we will make the appointment with the specialist per their recommendations. I also have given him my name and number so he has a contact point to ask questions as they arise, or to clarify anything he may need clarified. He has verbalized understanding of all of the above and had no further questions upon ending the call.He has my contact information.

## 2015-12-03 ENCOUNTER — Telehealth: Payer: Self-pay | Admitting: Acute Care

## 2015-12-03 ENCOUNTER — Other Ambulatory Visit: Payer: Self-pay | Admitting: Acute Care

## 2015-12-03 DIAGNOSIS — R918 Other nonspecific abnormal finding of lung field: Secondary | ICD-10-CM

## 2015-12-03 NOTE — Telephone Encounter (Signed)
Left VM letting patient know someone will be contacting him about getting pulmonary function tests done. There was no answer at either phone number listed.

## 2015-12-11 ENCOUNTER — Ambulatory Visit (HOSPITAL_COMMUNITY)
Admission: RE | Admit: 2015-12-11 | Discharge: 2015-12-11 | Disposition: A | Discharge status: Home / Self Care | Payer: BC Managed Care – PPO | Source: Ambulatory Visit | Attending: Acute Care | Admitting: Acute Care

## 2015-12-11 ENCOUNTER — Ambulatory Visit (HOSPITAL_COMMUNITY)
Admission: RE | Admit: 2015-12-11 | Discharge: 2015-12-11 | Disposition: A | Discharge status: Home / Self Care | Payer: BC Managed Care – PPO | Source: Ambulatory Visit | Attending: Endocrinology | Admitting: Endocrinology

## 2015-12-11 DIAGNOSIS — R590 Localized enlarged lymph nodes: Secondary | ICD-10-CM | POA: Diagnosis not present

## 2015-12-11 DIAGNOSIS — R918 Other nonspecific abnormal finding of lung field: Secondary | ICD-10-CM

## 2015-12-11 DIAGNOSIS — C3411 Malignant neoplasm of upper lobe, right bronchus or lung: Secondary | ICD-10-CM | POA: Insufficient documentation

## 2015-12-11 LAB — PULMONARY FUNCTION TEST
DL/VA % pred: 49 %
DL/VA: 2.36 ml/min/mmHg/L
DLCO UNC % PRED: 31 %
DLCO UNC: 11.32 ml/min/mmHg
FEF 25-75 POST: 0.37 L/s
FEF 25-75 Pre: 0.32 L/sec
FEF2575-%Change-Post: 16 %
FEF2575-%PRED-POST: 13 %
FEF2575-%Pred-Pre: 11 %
FEV1-%Change-Post: -1 %
FEV1-%PRED-POST: 34 %
FEV1-%Pred-Pre: 35 %
FEV1-POST: 1.11 L
FEV1-Pre: 1.13 L
FEV1FVC-%Change-Post: -7 %
FEV1FVC-%PRED-PRE: 48 %
FEV6-%CHANGE-POST: 3 %
FEV6-%PRED-POST: 61 %
FEV6-%PRED-PRE: 59 %
FEV6-Post: 2.5 L
FEV6-Pre: 2.42 L
FEV6FVC-%Change-Post: -2 %
FEV6FVC-%PRED-POST: 80 %
FEV6FVC-%PRED-PRE: 82 %
FVC-%CHANGE-POST: 6 %
FVC-%PRED-PRE: 71 %
FVC-%Pred-Post: 76 %
FVC-POST: 3.25 L
FVC-Pre: 3.06 L
POST FEV1/FVC RATIO: 34 %
PRE FEV1/FVC RATIO: 37 %
PRE FEV6/FVC RATIO: 79 %
Post FEV6/FVC ratio: 77 %
RV % pred: 197 %
RV: 5.25 L
TLC % PRED: 112 %
TLC: 8.63 L

## 2015-12-11 LAB — GLUCOSE, CAPILLARY: Glucose-Capillary: 104 mg/dL — ABNORMAL HIGH (ref 65–99)

## 2015-12-11 MED ORDER — ALBUTEROL SULFATE (2.5 MG/3ML) 0.083% IN NEBU
2.5000 mg | INHALATION_SOLUTION | Freq: Once | RESPIRATORY_TRACT | Status: AC
  Administered 2015-12-11: 2.5 mg via RESPIRATORY_TRACT

## 2015-12-11 MED ORDER — FLUDEOXYGLUCOSE F - 18 (FDG) INJECTION
8.8800 | Freq: Once | INTRAVENOUS | Status: AC | PRN
  Administered 2015-12-11: 8.88 via INTRAVENOUS

## 2015-12-15 ENCOUNTER — Telehealth: Payer: Self-pay | Admitting: Acute Care

## 2015-12-15 NOTE — Telephone Encounter (Signed)
I called [name] to let him know he will be discussed at this weeks thoracic conference. I told him I will call him after the conference and let him know the recommendation of the physicians. He verbalized understanding.

## 2015-12-17 ENCOUNTER — Telehealth: Payer: Self-pay | Admitting: Acute Care

## 2015-12-17 ENCOUNTER — Telehealth: Payer: Self-pay | Admitting: Endocrinology

## 2015-12-17 NOTE — Telephone Encounter (Signed)
Patient is calling for the results of his Xray.

## 2015-12-17 NOTE — Telephone Encounter (Signed)
I called and spoke with Ryan Contreras. I explained that we reviewed his scans at the thoracic conference this morning and the multidisciplinary group felt the next step for him is to have a biopsy. I explained that a biopsy is a tissue sample of the nodule that will be looked at under the microscope by a pathologist to help [place] get a diagnosis , which will determine his best plan of care. I have told him I will call him with the plan after speaking with Dr. Lamonte Sakai. He verbalized understanding of all of the above and had no further questions.

## 2015-12-17 NOTE — Telephone Encounter (Signed)
Pt advised of note below and voiced understanding.  

## 2015-12-17 NOTE — Telephone Encounter (Signed)
PET scan showed the nodule and 1 lymph node.  You will be contacted about what the next step is.

## 2015-12-17 NOTE — Telephone Encounter (Signed)
Could you please review the NM pet scan  from 12/11/2015 and advise on results. Thanks!

## 2015-12-21 ENCOUNTER — Telehealth: Payer: Self-pay | Admitting: Acute Care

## 2015-12-21 NOTE — Telephone Encounter (Signed)
I attempted to call both the home and mobile numbers to let Ryan Contreras know that Dr. Lamonte Sakai has viewed his scans and has determined the best way to proceed with biopsy. ( I have explained in detail what a biopsy is). I have asked him to call me back and I have left my contact information on the mobile phone only. He called back immediately. I have told him what is stated above and that we are working on scheduling him for and EBUS with bronch/biopsies. He has verbalized understanding. I told him we will call back once we have him scheduled. He stated that he preferred a Monday or Friday early am, but I explained that we have to work with Dr. Agustina Caroli schedule and that we could not guarantee time or day of week. He verbalized understanding.

## 2015-12-22 ENCOUNTER — Telehealth: Payer: Self-pay | Admitting: Endocrinology

## 2015-12-22 ENCOUNTER — Telehealth: Payer: Self-pay | Admitting: Acute Care

## 2015-12-22 NOTE — Telephone Encounter (Signed)
Thank you :)

## 2015-12-22 NOTE — Telephone Encounter (Signed)
I have called Ryan Contreras and let him know he is scheduled for his lung nodule biopsy 01/11/16 at 7:30 am with Dr. Lamonte Sakai at [facility]. I have told him that he will receive a phone call from central scheduling and that they will give him the specifics of what time to arrive at [facility] and where to check in. I have also spoken with him about holding his Plavix for 5 days before the procedure. He has verbalized understanding of the above and had no further questions. I have given him my contact information in the event he needs to speak with me prior to the procedure, or has any further questions.

## 2015-12-22 NOTE — Telephone Encounter (Signed)
-----   Message from Magdalen Spatz, NP sent at 12/21/2015  4:54 PM EST ----- Regarding: EBUS/ Bronch with biopsies Dr. Lamonte Sakai has reviewed the scans and will proceed with an EBUS and bronch with biopsies. We are in the process of getting it scheduled now. I have called Mr. Byard and updated him also. We will let you know when it is scheduled.  Thanks so much,  Eric Form, NP

## 2015-12-24 ENCOUNTER — Other Ambulatory Visit: Payer: Self-pay | Admitting: Endocrinology

## 2015-12-31 ENCOUNTER — Telehealth: Payer: Self-pay | Admitting: Pulmonary Disease

## 2015-12-31 MED ORDER — MOMETASONE FURO-FORMOTEROL FUM 200-5 MCG/ACT IN AERO: 2.0000 | INHALATION_SPRAY | Freq: Two times a day (BID) | RESPIRATORY_TRACT | Status: DC

## 2015-12-31 NOTE — Telephone Encounter (Signed)
Spoke with pt and advised of change from Symbicort to [facility].  Pt advised of instructions.  Rx sent to pharmacy.

## 2015-12-31 NOTE — Telephone Encounter (Signed)
Spoke with pharmacy-symbicort is not covered by insurance-covered alternatives are advair, dulera, and brio.   SN please advise if you want to proceed with a PA for symbicort, or switch to a covered alternative.  Thanks!

## 2015-12-31 NOTE — Telephone Encounter (Signed)
Per SN can switch to dulera 200 mcg 2 puffs bid, rinse after each use. thanks

## 2016-01-06 ENCOUNTER — Encounter (HOSPITAL_COMMUNITY): Payer: Self-pay | Admitting: *Deleted

## 2016-01-06 ENCOUNTER — Telehealth: Payer: Self-pay | Admitting: Emergency Medicine

## 2016-01-06 NOTE — Telephone Encounter (Signed)
Patient has appointment on 01/11/16 for EBUS.  Patient wants to know if he can take his medications before the EBUS? RB - please advise.

## 2016-01-07 NOTE — Telephone Encounter (Signed)
I called the patient and instructed him to stop his Plavix now. He can take his other meds as he usually does even on the day of the procedure. Please call him back am 1/27 to insure he understands that he needs to be off the plavix leading up to the procedure.

## 2016-01-08 NOTE — Telephone Encounter (Signed)
Called and spoke with patient, he verbalized understanding that he is not to take the Plavix.   Patient asked where he needed to go for the EBUS.  I asked him if he had received a call from the hospital because they will call him to go over the prep instructions for the procedure.  He says that he has not heard from the hospital yet.  I advised him that I would contact the hospital and find out when they will be calling him to give his instructions.    Called Baxter Flattery and she said that patient has contacted them several times as well asking about his instructions and she advised him several times that they will contact him the evening before his procedure.  Baxter Flattery says that she will contact the hospital and have them go ahead and contact the patient to give him his instructions.   Nothing further needed.

## 2016-01-10 NOTE — Anesthesia Preprocedure Evaluation (Addendum)
Anesthesia Evaluation  Patient identified by MRN, date of birth, ID band Patient awake    Reviewed: Allergy & Precautions, NPO status , Patient's Chart, lab work & pertinent test results  History of Anesthesia Complications Negative for: history of anesthetic complications  Airway Mallampati: II  TM Distance: >3 FB Neck ROM: Full    Dental no notable dental hx. (+) Dental Advisory Given, Poor Dentition, Edentulous Upper, Chipped, Missing   Pulmonary COPD,  COPD inhaler, Current Smoker,    Pulmonary exam normal breath sounds clear to auscultation       Cardiovascular hypertension, Pt. on medications + CAD and + Cardiac Stents  Normal cardiovascular exam Rhythm:Regular Rate:Normal     Neuro/Psych negative neurological ROS  negative psych ROS   GI/Hepatic Neg liver ROS, PUD,   Endo/Other  diabetes  Renal/GU negative Renal ROS  negative genitourinary   Musculoskeletal negative musculoskeletal ROS (+)   Abdominal   Peds negative pediatric ROS (+)  Hematology negative hematology ROS (+)   Anesthesia Other Findings   Reproductive/Obstetrics negative OB ROS                            Anesthesia Physical Anesthesia Plan  ASA: III  Anesthesia Plan: General   Post-op Pain Management:    Induction: Intravenous  Airway Management Planned: Oral ETT  Additional Equipment:   Intra-op Plan:   Post-operative Plan: Extubation in OR  Informed Consent: I have reviewed the patients History and Physical, chart, labs and discussed the procedure including the risks, benefits and alternatives for the proposed anesthesia with the patient or authorized representative who has indicated his/her understanding and acceptance.   Dental advisory given  Plan Discussed with: CRNA  Anesthesia Plan Comments:         Anesthesia Quick Evaluation

## 2016-01-11 ENCOUNTER — Ambulatory Visit (HOSPITAL_COMMUNITY)
Admission: RE | Admit: 2016-01-11 | Discharge: 2016-01-11 | Disposition: A | Discharge status: Home / Self Care | Payer: BC Managed Care – PPO | Source: Ambulatory Visit | Attending: Emergency Medicine | Admitting: Emergency Medicine

## 2016-01-11 ENCOUNTER — Ambulatory Visit (HOSPITAL_COMMUNITY): Payer: BC Managed Care – PPO | Admitting: Anesthesiology

## 2016-01-11 ENCOUNTER — Encounter (HOSPITAL_COMMUNITY): Payer: Self-pay

## 2016-01-11 ENCOUNTER — Encounter (HOSPITAL_COMMUNITY)
Admission: RE | Disposition: A | Discharge status: Home / Self Care | Payer: Self-pay | Source: Ambulatory Visit | Attending: Emergency Medicine

## 2016-01-11 DIAGNOSIS — R918 Other nonspecific abnormal finding of lung field: Secondary | ICD-10-CM | POA: Diagnosis not present

## 2016-01-11 DIAGNOSIS — E119 Type 2 diabetes mellitus without complications: Secondary | ICD-10-CM | POA: Insufficient documentation

## 2016-01-11 DIAGNOSIS — Z87891 Personal history of nicotine dependence: Secondary | ICD-10-CM | POA: Diagnosis not present

## 2016-01-11 DIAGNOSIS — M109 Gout, unspecified: Secondary | ICD-10-CM | POA: Diagnosis not present

## 2016-01-11 DIAGNOSIS — C3411 Malignant neoplasm of upper lobe, right bronchus or lung: Secondary | ICD-10-CM | POA: Diagnosis not present

## 2016-01-11 DIAGNOSIS — R590 Localized enlarged lymph nodes: Secondary | ICD-10-CM | POA: Diagnosis not present

## 2016-01-11 DIAGNOSIS — Z7902 Long term (current) use of antithrombotics/antiplatelets: Secondary | ICD-10-CM | POA: Insufficient documentation

## 2016-01-11 DIAGNOSIS — Z955 Presence of coronary angioplasty implant and graft: Secondary | ICD-10-CM | POA: Diagnosis not present

## 2016-01-11 DIAGNOSIS — Z79899 Other long term (current) drug therapy: Secondary | ICD-10-CM | POA: Diagnosis not present

## 2016-01-11 DIAGNOSIS — R911 Solitary pulmonary nodule: Secondary | ICD-10-CM | POA: Diagnosis present

## 2016-01-11 DIAGNOSIS — Z7982 Long term (current) use of aspirin: Secondary | ICD-10-CM | POA: Insufficient documentation

## 2016-01-11 DIAGNOSIS — J449 Chronic obstructive pulmonary disease, unspecified: Secondary | ICD-10-CM | POA: Diagnosis not present

## 2016-01-11 DIAGNOSIS — I10 Essential (primary) hypertension: Secondary | ICD-10-CM | POA: Insufficient documentation

## 2016-01-11 DIAGNOSIS — E785 Hyperlipidemia, unspecified: Secondary | ICD-10-CM | POA: Insufficient documentation

## 2016-01-11 DIAGNOSIS — I2510 Atherosclerotic heart disease of native coronary artery without angina pectoris: Secondary | ICD-10-CM | POA: Insufficient documentation

## 2016-01-11 DIAGNOSIS — R599 Enlarged lymph nodes, unspecified: Secondary | ICD-10-CM

## 2016-01-11 HISTORY — PX: ENDOBRONCHIAL ULTRASOUND: SHX5096

## 2016-01-11 HISTORY — DX: Chronic obstructive pulmonary disease, unspecified: J44.9

## 2016-01-11 LAB — GLUCOSE, CAPILLARY: Glucose-Capillary: 89 mg/dL (ref 65–99)

## 2016-01-11 SURGERY — ENDOBRONCHIAL ULTRASOUND (EBUS)
Anesthesia: General | Laterality: Bilateral

## 2016-01-11 MED ORDER — EPHEDRINE SULFATE 50 MG/ML IJ SOLN
INTRAMUSCULAR | Status: DC | PRN
  Administered 2016-01-11: 10 mg via INTRAVENOUS

## 2016-01-11 MED ORDER — ATORVASTATIN CALCIUM 80 MG PO TABS: 80.0000 mg | ORAL_TABLET | Freq: Every day | ORAL | Status: DC

## 2016-01-11 MED ORDER — ONDANSETRON HCL 4 MG/2ML IJ SOLN
INTRAMUSCULAR | Status: DC | PRN
  Administered 2016-01-11: 4 mg via INTRAVENOUS

## 2016-01-11 MED ORDER — ALBUTEROL SULFATE (2.5 MG/3ML) 0.083% IN NEBU
INHALATION_SOLUTION | RESPIRATORY_TRACT | Status: AC
  Filled 2016-01-11: qty 3

## 2016-01-11 MED ORDER — PROPOFOL 10 MG/ML IV BOLUS
INTRAVENOUS | Status: AC
  Filled 2016-01-11: qty 20

## 2016-01-11 MED ORDER — CLOPIDOGREL BISULFATE 75 MG PO TABS: 75.0000 mg | ORAL_TABLET | Freq: Every day | ORAL | Status: DC

## 2016-01-11 MED ORDER — PROPOFOL 10 MG/ML IV BOLUS
INTRAVENOUS | Status: DC | PRN
  Administered 2016-01-11: 170 mg via INTRAVENOUS

## 2016-01-11 MED ORDER — ALBUTEROL SULFATE (2.5 MG/3ML) 0.083% IN NEBU
2.5000 mg | INHALATION_SOLUTION | Freq: Once | RESPIRATORY_TRACT | Status: AC
  Administered 2016-01-11: 2.5 mg via RESPIRATORY_TRACT

## 2016-01-11 MED ORDER — PHENYLEPHRINE HCL 10 MG/ML IJ SOLN
INTRAMUSCULAR | Status: DC | PRN
  Administered 2016-01-11 (×6): 80 ug via INTRAVENOUS

## 2016-01-11 MED ORDER — LACTATED RINGERS IV SOLN
INTRAVENOUS | Status: DC | PRN
  Administered 2016-01-11: 07:00:00 via INTRAVENOUS

## 2016-01-11 MED ORDER — FENTANYL CITRATE (PF) 100 MCG/2ML IJ SOLN
INTRAMUSCULAR | Status: DC | PRN
  Administered 2016-01-11: 50 ug via INTRAVENOUS

## 2016-01-11 MED ORDER — SUCCINYLCHOLINE CHLORIDE 20 MG/ML IJ SOLN
INTRAMUSCULAR | Status: DC | PRN
  Administered 2016-01-11: 100 mg via INTRAVENOUS

## 2016-01-11 MED ORDER — LIDOCAINE HCL (CARDIAC) 20 MG/ML IV SOLN
INTRAVENOUS | Status: DC | PRN
  Administered 2016-01-11: 100 mg via INTRAVENOUS

## 2016-01-11 MED ORDER — LIDOCAINE HCL (CARDIAC) 20 MG/ML IV SOLN
INTRAVENOUS | Status: AC
  Filled 2016-01-11: qty 5

## 2016-01-11 MED ORDER — DEXAMETHASONE SODIUM PHOSPHATE 10 MG/ML IJ SOLN
INTRAMUSCULAR | Status: DC | PRN
  Administered 2016-01-11: 10 mg via INTRAVENOUS

## 2016-01-11 MED ORDER — ROCURONIUM BROMIDE 100 MG/10ML IV SOLN
INTRAVENOUS | Status: AC
  Filled 2016-01-11: qty 1

## 2016-01-11 MED ORDER — FENTANYL CITRATE (PF) 100 MCG/2ML IJ SOLN
INTRAMUSCULAR | Status: AC
  Filled 2016-01-11: qty 2

## 2016-01-11 MED ORDER — PHENYLEPHRINE 40 MCG/ML (10ML) SYRINGE FOR IV PUSH (FOR BLOOD PRESSURE SUPPORT)
PREFILLED_SYRINGE | INTRAVENOUS | Status: AC
  Filled 2016-01-11: qty 10

## 2016-01-11 NOTE — Transfer of Care (Signed)
Immediate Anesthesia Transfer of Care Note  Patient: Ryan Contreras  Procedure(s) Performed: Procedure(s): ENDOBRONCHIAL ULTRASOUND (Bilateral)  Patient Location: PACU  Anesthesia Type:General  Level of Consciousness: sedated  Airway & Oxygen Therapy: Patient Spontanous Breathing and Patient connected to face mask oxygen  Post-op Assessment: Report given to RN and Post -op Vital signs reviewed and stable  Post vital signs: Reviewed and stable  Last Vitals:  Filed Vitals:   01/11/16 0723  BP: 165/86  Pulse: 90  Temp: 36.8 C  Resp: 23    Complications: No apparent anesthesia complications

## 2016-01-11 NOTE — H&P (Signed)
Ryan Contreras is an 75 y.o. male.   Chief Complaint: Pt presents for bronchoscopy for RUL nodules, Hilar adenopathy HPI: 75 yo man with hx tobacco use, hx CAD, hyperlipidemia, HTN, DM2, COPD.  FEV1 1.13L (35% pred 11/2015), He underwent LDCT screening on 11/26/15 that identified RUL perihilar nodules. PET scan was performed 12/30 that confirmed that the lesion was hypermetabolic. Based on location recommendation was made to achieve tissue diagnosis via bronchoscopy with endobronchial ultrasound and biopsies. Pt understands the procedure, all questions answered. He has been off of his plavix for the last 4 days, took no meds this am. No new issues, no CP, no dyspnea.   Past Medical History  Diagnosis Date  . Gout, unspecified     foot  . HYPERLIPIDEMIA   . HYPERTENSION   . PEPTIC ULCER DISEASE   . TOBACCO ABUSE   . Type 2 diabetes mellitus ([place])     denies on 01-06-16  . COPD (chronic obstructive pulmonary disease) ([place])     due to 50+ years Smoking.  . CAD, NATIVE VESSEL     coronary stent x1-tx. Plavix use.    Past Surgical History  Procedure Laterality Date  . Coronary stent placement      s/p  . Cardiac catheterization      '06-stent placed, 6'13 - Dr. Lizbeth Bark follows    Family History  Problem Relation Age of Onset  . Cancer Sister     Uncertain type  . Cancer Brother     Uncertain type  . Arthritis Mother   . Hypertension Father    Social History:  reports that he has been smoking Cigarettes.  He has a 25 pack-year smoking history. He has never used smokeless tobacco. He reports that he drinks alcohol. He reports that he does not use illicit drugs.  Allergies: No Known Allergies  Medications Prior to Admission  Medication Sig Dispense Refill  . acetaminophen (TYLENOL) 500 MG tablet Take 250 mg by mouth every 6 (six) hours as needed for moderate pain.    Marland Kitchen acetaZOLAMIDE (DIAMOX) 250 MG tablet Take 1 tablet (250 mg total) by mouth daily. 30 tablet 2  . allopurinol  (ZYLOPRIM) 300 MG tablet TAKE 1 TABLET BY MOUTH EVERY DAY 30 tablet 2  . aspirin 81 MG tablet Take 81 mg by mouth at bedtime.     Marland Kitchen atorvastatin (LIPITOR) 80 MG tablet Take 1 tablet (80 mg total) by mouth daily. (Patient taking differently: Take 80 mg by mouth at bedtime. ) 30 tablet 11  . clopidogrel (PLAVIX) 75 MG tablet TAKE 1 TABLET BY MOUTH EVERY DAY 30 tablet 10  . losartan-hydrochlorothiazide (HYZAAR) 100-25 MG per tablet TAKE 1 TABLET BY MOUTH EVERY DAY 30 tablet 3  . methocarbamol (ROBAXIN) 500 MG tablet Take 500 mg by mouth every morning.     . mometasone-formoterol (DULERA) 200-5 MCG/ACT AERO Inhale 2 puffs into the lungs 2 (two) times daily. 1 Inhaler 6  . potassium chloride (K-DUR) 10 MEQ tablet Take 1 tablet (10 mEq total) by mouth daily. 30 tablet 11  . sildenafil (VIAGRA) 100 MG tablet Take 1 tablet (100 mg total) by mouth as needed for erectile dysfunction. 1 tablet 4  . furosemide (LASIX) 20 MG tablet Take 1 tablet (20 mg total) by mouth daily. (Patient not taking: Reported on 12/29/2015) 30 tablet 3    Results for orders placed or performed during the hospital encounter of 01/11/16 (from the past 48 hour(s))  Glucose, capillary  Status: None   Collection Time: 01/11/16  7:21 AM  Result Value Ref Range   Glucose-Capillary 89 65 - 99 mg/dL   No results found.  ROS As per HPI  Blood pressure 165/86, pulse 90, temperature 98.3 F (36.8 C), temperature source Oral, resp. rate 23, height '6\' 1"'$  (1.854 m), weight 82.555 kg (182 lb), SpO2 90 %. Physical Exam  Gen: Pleasant, well-nourished, in no distress,  normal affect  ENT: No lesions,  mouth clear,  oropharynx clear, no postnasal drip  Neck: No JVD, no TMG, no carotid bruits  Lungs: No use of accessory muscles, no dullness to percussion, clear without rales or rhonchi  Cardiovascular: RRR, heart sounds normal, no murmur or gallops, no peripheral edema  Abdomen: soft and NT, no HSM,  BS normal  Musculoskeletal: No  deformities, no cyanosis or clubbing  Neuro: alert, non focal  Skin: Warm, no lesions or rashes   Assessment/Plan Hypermetabolic RUL nodules in a former smoker, suspicious for primary lung malignancy. Agree with tissue bx via EBUS and needle biopsies. Will proceed this am, follow results with pt and Dr Loanne Drilling when available.    Nicky Kras S. 01/11/2016, 7:30 AM

## 2016-01-11 NOTE — Anesthesia Postprocedure Evaluation (Signed)
Anesthesia Post Note  Patient: Ryan Contreras  Procedure(s) Performed: Procedure(s) (LRB): ENDOBRONCHIAL ULTRASOUND (Bilateral)  Patient location during evaluation: PACU Anesthesia Type: General Level of consciousness: awake and alert Pain management: pain level controlled Vital Signs Assessment: post-procedure vital signs reviewed and stable Respiratory status: spontaneous breathing, nonlabored ventilation, respiratory function stable and patient connected to nasal cannula oxygen Cardiovascular status: blood pressure returned to baseline and stable Postop Assessment: no signs of nausea or vomiting Anesthetic complications: no    Last Vitals:  Filed Vitals:   01/11/16 1130 01/11/16 1140  BP: 113/46 110/49  Pulse: 86 81  Temp:    Resp: 12 13    Last Pain: There were no vitals filed for this visit.               [name]

## 2016-01-11 NOTE — Anesthesia Procedure Notes (Signed)
Procedure Name: Intubation Date/Time: 01/11/2016 7:33 AM Performed by: Lind Covert Pre-anesthesia Checklist: Patient identified, Timeout performed, Emergency Drugs available, Suction available and Patient being monitored Patient Re-evaluated:Patient Re-evaluated prior to inductionOxygen Delivery Method: Circle system utilized Preoxygenation: Pre-oxygenation with 100% oxygen Intubation Type: IV induction Laryngoscope Size: Mac and 4 Grade View: Grade II Tube type: Oral Tube size: 9.0 mm Number of attempts: 1 Airway Equipment and Method: Stylet Placement Confirmation: breath sounds checked- equal and bilateral,  ETT inserted through vocal cords under direct vision and positive ETCO2 Secured at: 22 cm Tube secured with: Tape Dental Injury: Teeth and Oropharynx as per pre-operative assessment

## 2016-01-11 NOTE — Op Note (Signed)
Video Bronchoscopy with Endobronchial Ultrasound Procedure Note  Date of Operation: 01/11/2016  Pre-op Diagnosis: right upper lobe nodules and hilar adenopathy  Post-op Diagnosis: same  Surgeon: Baltazar Apo  Assistants: none  Anesthesia: General endotracheal anesthesia  Operation: Flexible video fiberoptic bronchoscopy with endobronchial ultrasound and biopsies.  Estimated Blood Loss: 10 mL  Complications: none apparent  Indications and History: ROARKE MARCIANO is a 75 y.o. male with history of tobacco use, coronary disease, hypertension, COPD. He was noted to have a right upper lobe perihilar mass on screening low-dose CT scan of the chest. A PET scan confirmed that the area was hypermetabolic and recommendation was made to attempt biopsies via navigational bronchoscopy with endobronchial ultrasound.  The risks, benefits, complications, treatment options and expected outcomes were discussed with the patient.  The possibilities of pneumothorax, pneumonia, reaction to medication, pulmonary aspiration, perforation of a viscus, bleeding, failure to diagnose a condition and creating a complication requiring transfusion or operation were discussed with the patient who freely signed the consent.    Description of Procedure: The patient was examined in the preoperative area and history and data from the preprocedure consultation were reviewed. It was deemed appropriate to proceed.  The patient was taken to [facility], identified as [name] and the procedure verified as Flexible Video Fiberoptic Bronchoscopy.  A Time Out was held and the above information confirmed. General anesthesia was initiated and the patient  was orally intubated. The video fiberoptic bronchoscope was introduced via the endotracheal tube and a general inspection was performed which showed normal airways throughout with the exception of some significant narrowing of all segmental airways of the right upper lobe. These  were fishmouth and the bronchoscope would not pass into the segments; no subsegmental carinae could be identified. The standard scope was then withdrawn and the endobronchial ultrasound was used to identify and characterize the peritracheal, hilar and bronchial lymph nodes. Inspection showed a normal station 7 note, a somewhat elongated but otherwise normal-appearing 4R node, and finally significant enlargement of tissue in the region of the right upper lobe bronchus consistent with the right upper lobe nodule seen on CT scan of the chest. Using real-time ultrasound guidance Wang needle biopsies were take from Station 4R and 7 nodes and were sent for cytology. Multiple Wang needle biopsies were performed on the right upper lobe nodular material to be sent for cytology. Samples were collected both inside and slightly distal to the right upper lobe airway and were pooled for cytology evaluation. Finally the standard bronchoscope was reintroduced and endobronchial brushings were performed in the narrowed right upper lobe segmental airways for cytology. The patient tolerated the procedure well without apparent complications. There was no significant blood loss. Anesthesia was reversed and the patient was taken to recovery area.   Samples: 1. Wang needle biopsies from 7 node 2. Wang needle biopsies from 4R node 3. Wang needle biopsies from right upper lobe nodule 4. Right upper lobe endobronchial brushings  Plans:  The patient will be discharged to home when recovered from anesthesia. We will review the cytology results with the patient when they become available. Outpatient followup will be with Dr Lamonte Sakai.    Baltazar Apo, MD, PhD 01/11/2016, 9:18 AM [facility] [phone] or if no answer [phone]

## 2016-01-11 NOTE — Progress Notes (Signed)
Video bronchoscopy performed after EBUS procedure.  intervention bronchial brushing   Pt tolerated well.

## 2016-01-11 NOTE — Progress Notes (Signed)
Patient was weaned off of oxygen and his saturations were 87-90, he was also wheezy. Dr. Lamonte Sakai was notified and ordered an albuterol nebulizer. After the neb was given he sounded much better but his sats were the same. Dr. Lamonte Sakai was notified again and said he was comfortable with his saturations since he was at 48 when he came in. Patient proceeded to get dressed and had no other symptoms or complications.

## 2016-01-11 NOTE — Discharge Instructions (Signed)
Flexible Bronchoscopy, Care After These instructions give you information on caring for yourself after your procedure. Your doctor may also give you more specific instructions. Call your doctor if you have any problems or questions after your procedure. HOME CARE  Do not eat or drink anything for 2 hours after your procedure. If you try to eat or drink before the medicine wears off, food or drink could go into your lungs. You could also burn yourself.  After 2 hours have passed and when you can cough and gag normally, you may eat soft food and drink liquids slowly.  The day after the test, you may eat your normal diet.  You may do your normal activities.  Keep all doctor visits. GET HELP RIGHT AWAY IF:  You get more and more short of breath.  You get light-headed.  You feel like you are going to pass out (faint).  You have chest pain.  You have new problems that worry you.  You cough up more than a little blood.  You cough up more blood than before. MAKE SURE YOU:  Understand these instructions.  Will watch your condition.  Will get help right away if you are not doing well or get worse.   This information is not intended to replace advice given to you by your health care provider. Make sure you discuss any questions you have with your health care provider.   Document Released: 09/25/2009 Document Revised: 12/03/2013 Document Reviewed: 08/02/2013 Elsevier Interactive Patient Education [employer].  Please do not start Plavix until 01/12/16  Please call our office for any questions or concerns. [phone].   General Anesthesia, Adult, Care After Refer to this sheet in the next few weeks. These instructions provide you with information on caring for yourself after your procedure. Your health care provider may also give you more specific instructions. Your treatment has been planned according to current medical practices, but problems sometimes occur. Call your  health care provider if you have any problems or questions after your procedure. WHAT TO EXPECT AFTER THE PROCEDURE After the procedure, it is typical to experience:  Sleepiness.  Nausea and vomiting. HOME CARE INSTRUCTIONS  For the first 24 hours after general anesthesia:  Have a responsible person with you.  Do not drive a car. If you are alone, do not take public transportation.  Do not drink alcohol.  Do not take medicine that has not been prescribed by your health care provider.  Do not sign important papers or make important decisions.  You may resume a normal diet and activities as directed by your health care provider.  Change bandages (dressings) as directed.  If you have questions or problems that seem related to general anesthesia, call the hospital and ask for the anesthetist or anesthesiologist on call. SEEK MEDICAL CARE IF:  You have nausea and vomiting that continue the day after anesthesia.  You develop a rash. SEEK IMMEDIATE MEDICAL CARE IF:   You have difficulty breathing.  You have chest pain.  You have any allergic problems.   This information is not intended to replace advice given to you by your health care provider. Make sure you discuss any questions you have with your health care provider.   Document Released: 03/06/2001 Document Revised: 12/19/2014 Document Reviewed: 03/28/2012 Elsevier Interactive Patient Education [employer].

## 2016-01-12 ENCOUNTER — Telehealth: Payer: Self-pay | Admitting: Emergency Medicine

## 2016-01-12 ENCOUNTER — Encounter (HOSPITAL_COMMUNITY): Payer: Self-pay | Admitting: Emergency Medicine

## 2016-01-12 NOTE — Telephone Encounter (Signed)
Called spoke with pt. He was wanting to know if his results were back yet. I advised they were not and RB will go over these with him next week at his appt. He verbalized understanding and needed nothing further

## 2016-01-13 ENCOUNTER — Telehealth: Payer: Self-pay | Admitting: Emergency Medicine

## 2016-01-13 DIAGNOSIS — C3491 Malignant neoplasm of unspecified part of right bronchus or lung: Secondary | ICD-10-CM

## 2016-01-13 NOTE — Telephone Encounter (Signed)
He reports that he was a bit dyspneic last pm, has had some phlegm I reviewed his biopsy results with him today. 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 His 4R and 7 nodes were negative. I will refer him to the multidisciplinary thoracic oncology clinic to discuss therapeutic options. He understands the plan.

## 2016-01-13 NOTE — Telephone Encounter (Signed)
Patient calling back because he has not heard back from his message this morning. Called and advised patient that we have not received a response yet and will contact him as soon as we have received a response from Dr. Lamonte Sakai.

## 2016-01-13 NOTE — Telephone Encounter (Signed)
Pt c/o worsening SOB since Ebus 2 days ago, hoarseness, sinus drainage, prod cough (clear now - did have some hemoptysis the night of the procedure).  Denies fever or chest discomfort.  Please advise on symptoms and also Ebus results are available in epic.

## 2016-01-13 NOTE — Telephone Encounter (Signed)
Pt has not heard anything [phone]

## 2016-01-14 ENCOUNTER — Telehealth: Payer: Self-pay | Admitting: Emergency Medicine

## 2016-01-14 NOTE — Telephone Encounter (Signed)
Per 01/13/16 phone note:  Collene Gobble, MD at 01/13/2016 4:33 PM     Status: Signed       Expand All Collapse All   He reports that he was a bit dyspneic last pm, has had some phlegm I reviewed his biopsy results with him today. 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 His 4R and 7 nodes were negative. I will refer him to the multidisciplinary thoracic oncology clinic to discuss therapeutic options. He understands the plan.      --  Pt is a requesting RB call him again about his results. He reports he doesn't quit understand what he was told and wants an explanation please. Please advise RB thanks

## 2016-01-15 ENCOUNTER — Telehealth: Payer: Self-pay | Admitting: *Deleted

## 2016-01-15 ENCOUNTER — Telehealth: Payer: Self-pay | Admitting: Internal Medicine

## 2016-01-15 DIAGNOSIS — R918 Other nonspecific abnormal finding of lung field: Secondary | ICD-10-CM

## 2016-01-15 NOTE — Telephone Encounter (Signed)
Oncology Nurse Navigator Documentation  Oncology Nurse Navigator Flowsheets 01/15/2016  Navigator Encounter Type Telephone;Introductory phone call/I received a referral yesterday on Ryan Contreras.  I called but was unable to reach.  I left a vm message regarding appt for [place] on 01/21/16 arrive at 1:30.  I also left my name and phone number to call if needed.   Treatment Phase Abnormal Scans  Barriers/Navigation Needs Coordination of Care  Interventions Coordination of Care  Coordination of Care Appts  Acuity Level 1  Time Spent with Patient 15

## 2016-01-15 NOTE — Telephone Encounter (Signed)
[phone], pt cb

## 2016-01-15 NOTE — Telephone Encounter (Signed)
appt time changed due to patient seeing Dr. Lamonte Sakai at the same time. Patient will be see with Dr. Julien Nordmann on 01/21/16 arrive at 3:30.  I called and left a vm message with change

## 2016-01-15 NOTE — Telephone Encounter (Signed)
Pt called in regarding a pending appt.  Reviewed upcoming appt 2/9 and encouraged pt bring family to assistance him during appt.

## 2016-01-15 NOTE — Telephone Encounter (Signed)
Spoke with pt, states he has not yet heard from [place] and was calling to follow up.  States he has several questions about his type of cancer that he would like RB to discuss further with him.  Best callback number for pt is [phone].  Pt requesting RB call him to further discuss this.  RB please advise.  Thanks.

## 2016-01-15 NOTE — Telephone Encounter (Signed)
Pt had questions for me regarding the tissue type, prognosis, severity of his diagnosis. I clarified that this is NSCLCA, probably squamous cell. Also that he still has some staging w/u to complete (ie MRI brain) but that thus far it looks to be lower stage. Underscored w him that he needs to keep [place] appt 01/21/16.

## 2016-01-20 ENCOUNTER — Telehealth: Payer: Self-pay | Admitting: Emergency Medicine

## 2016-01-20 ENCOUNTER — Telehealth: Payer: Self-pay | Admitting: *Deleted

## 2016-01-20 NOTE — Telephone Encounter (Signed)
Called and left a friendly reminder for pt about his clinic appt tomorrow 2/9 w/ arrival time of 3:30, address and contact info.

## 2016-01-20 NOTE — Telephone Encounter (Signed)
Ryan Contreras, I spoke with this pt and he states already set up with [place] for tomorrow  Is there any reason he needs to keep ov with you tomorrow? Please advise, thanks!

## 2016-01-20 NOTE — Telephone Encounter (Signed)
No - he needs to skip ours and go to [facility]. I think Oncology was shuffling his appt time so he could make our visit. They might appreciate a call to know he will be available earlier, might help with their schedule.

## 2016-01-20 NOTE — Telephone Encounter (Signed)
Cancelled appt and spoke with the pt to notify him  I also called and [facility] for Dr [facility] nurse to be made aware that we cancelled pt's appt here

## 2016-01-21 ENCOUNTER — Ambulatory Visit: Payer: BC Managed Care – PPO | Attending: Internal Medicine | Admitting: Physical Therapy

## 2016-01-21 ENCOUNTER — Telehealth: Payer: Self-pay | Admitting: *Deleted

## 2016-01-21 ENCOUNTER — Ambulatory Visit (HOSPITAL_BASED_OUTPATIENT_CLINIC_OR_DEPARTMENT_OTHER): Payer: BC Managed Care – PPO

## 2016-01-21 ENCOUNTER — Telehealth: Payer: Self-pay | Admitting: Internal Medicine

## 2016-01-21 ENCOUNTER — Encounter: Payer: Self-pay | Admitting: *Deleted

## 2016-01-21 ENCOUNTER — Ambulatory Visit: Payer: Medicare Other | Admitting: Emergency Medicine

## 2016-01-21 ENCOUNTER — Ambulatory Visit
Admission: RE | Admit: 2016-01-21 | Discharge: 2016-01-21 | Disposition: A | Discharge status: Home / Self Care | Payer: BC Managed Care – PPO | Source: Ambulatory Visit | Attending: Radiation Oncology | Admitting: Radiation Oncology

## 2016-01-21 ENCOUNTER — Ambulatory Visit (HOSPITAL_BASED_OUTPATIENT_CLINIC_OR_DEPARTMENT_OTHER): Payer: BC Managed Care – PPO | Admitting: Internal Medicine

## 2016-01-21 ENCOUNTER — Encounter: Payer: Self-pay | Admitting: Internal Medicine

## 2016-01-21 VITALS — BP 135/70 | HR 103 | Temp 98.4°F | Resp 19 | Ht 73.0 in | Wt 177.6 lb

## 2016-01-21 DIAGNOSIS — R0602 Shortness of breath: Secondary | ICD-10-CM

## 2016-01-21 DIAGNOSIS — R293 Abnormal posture: Secondary | ICD-10-CM | POA: Diagnosis not present

## 2016-01-21 DIAGNOSIS — M2569 Stiffness of other specified joint, not elsewhere classified: Secondary | ICD-10-CM

## 2016-01-21 DIAGNOSIS — R29898 Other symptoms and signs involving the musculoskeletal system: Secondary | ICD-10-CM | POA: Diagnosis not present

## 2016-01-21 DIAGNOSIS — C3411 Malignant neoplasm of upper lobe, right bronchus or lung: Secondary | ICD-10-CM

## 2016-01-21 DIAGNOSIS — M2560 Stiffness of unspecified joint, not elsewhere classified: Secondary | ICD-10-CM

## 2016-01-21 DIAGNOSIS — N411 Chronic prostatitis: Secondary | ICD-10-CM

## 2016-01-21 DIAGNOSIS — C3491 Malignant neoplasm of unspecified part of right bronchus or lung: Secondary | ICD-10-CM

## 2016-01-21 DIAGNOSIS — R918 Other nonspecific abnormal finding of lung field: Secondary | ICD-10-CM

## 2016-01-21 LAB — COMPREHENSIVE METABOLIC PANEL
ALBUMIN: 3.6 g/dL (ref 3.5–5.0)
ALK PHOS: 90 U/L (ref 40–150)
ALT: 19 U/L (ref 0–55)
ANION GAP: 9 meq/L (ref 3–11)
AST: 23 U/L (ref 5–34)
BILIRUBIN TOTAL: 0.89 mg/dL (ref 0.20–1.20)
BUN: 15.3 mg/dL (ref 7.0–26.0)
CALCIUM: 9 mg/dL (ref 8.4–10.4)
CO2: 31 mEq/L — ABNORMAL HIGH (ref 22–29)
CREATININE: 0.8 mg/dL (ref 0.7–1.3)
Chloride: 100 mEq/L (ref 98–109)
EGFR: >90 mL/min/{1.73_m2} (ref >90)
Glucose: 97 mg/dl (ref 70–140)
Potassium: 3.8 mEq/L (ref 3.5–5.1)
Sodium: 140 mEq/L (ref 136–145)
TOTAL PROTEIN: 7.4 g/dL (ref 6.4–8.3)

## 2016-01-21 LAB — CBC WITH DIFFERENTIAL/PLATELET
BASO%: 1 % (ref 0.0–2.0)
Basophils Absolute: 0.1 10*3/uL (ref 0.0–0.1)
EOS%: 1.3 % (ref 0.0–7.0)
Eosinophils Absolute: 0.1 10*3/uL (ref 0.0–0.5)
HEMATOCRIT: 45.5 % (ref 38.4–49.9)
HGB: 14.8 g/dL (ref 13.0–17.1)
LYMPH#: 2.4 10*3/uL (ref 0.9–3.3)
LYMPH%: 29 % (ref 14.0–49.0)
MCH: 31.7 pg (ref 27.2–33.4)
MCHC: 32.5 g/dL (ref 32.0–36.0)
MCV: 97.4 fL (ref 79.3–98.0)
MONO#: 1.5 10*3/uL — AB (ref 0.1–0.9)
MONO%: 18.4 % — ABNORMAL HIGH (ref 0.0–14.0)
NEUT%: 50.3 % (ref 39.0–75.0)
NEUTROS ABS: 4.2 10*3/uL (ref 1.5–6.5)
PLATELETS: 245 10*3/uL (ref 140–400)
RBC: 4.67 10*6/uL (ref 4.20–5.82)
RDW: 16 % — ABNORMAL HIGH (ref 11.0–14.6)
WBC: 8.4 10*3/uL (ref 4.0–10.3)

## 2016-01-21 MED ORDER — PROCHLORPERAZINE MALEATE 10 MG PO TABS: 10.0000 mg | ORAL_TABLET | Freq: Four times a day (QID) | ORAL | Status: DC | PRN

## 2016-01-21 NOTE — Progress Notes (Signed)
[place] Telephone:([phone]   [phone] Multidisciplinary thoracic oncology clinic  CONSULT NOTE  REFERRING PHYSICIAN: Dr. Baltazar Apo  REASON FOR CONSULTATION:  75 years old African-American male recently diagnosed with lung cancer.  HPI PERETZ THIEME is a 75 y.o. male with past medical history significant for hypertension, coronary artery disease, COPD, dyslipidemia, gout as well as peptic ulcer disease. The patient was followed by Dr. Lamonte Sakai for his COPD and because of the long smoking history he had CT screening of the chest performed on 11/25/2016. It showed multiple pulmonary nodules in the lungs bilaterally. The largest and most concerning of which has a volume derived mean diameter of 21.5 in the medial aspect of the right upper lobe. This is contiguous with some soft tissue thickening in the right hilar region which is suspicious for lymphadenopathy (mentioned above). In addition, in the medial aspect of the superior segment of the left lower lobe. There is a mixed solid and subsolid lesion which has volume derived mean diameters of 13.5 mm and 9.8 mm for the ground-glass attenuation components and solid components respectively. Several other smaller pulmonary nodules are also noted. This was followed by a PET scan on 12/11/2015 and it showed 2.7 cm spiculated hypermetabolic nodule in central right upper lobe, consistent with primary bronchogenic carcinoma. Mild hypermetabolic right hilar lymphadenopathy, consistent with metastatic disease. 11 mm sub-solid left lower lobe pulmonary nodule with low-grade metabolic activity (SUV max of 1.6). Low-grade adenocarcinoma cannot be excluded. No evidence of metastatic disease within the neck, abdomen, or pelvis. Several foci of hypermetabolic activity within prostate, which may be due to prostatitis or prostate carcinoma. Consider urology consultation and correlation with PSA level. On 01/11/2016 the patient  underwent flexible video fiberoptic bronchoscopy with endobronchial ultrasound and biopsies under the care of Dr. Lamonte Sakai. The final cytology (Accession: [id number]) of the fine-needle aspiration of the right perihilar nodule showed non-small cell carcinoma favoring squamous cell carcinoma. Dr. Lamonte Sakai kindly referred the patient to me today for evaluation and recommendation regarding treatment of his condition. When seen today she continues to have shortness breath with exertion in addition to cough productive of clear sputum but no significant chest pain or hemoptysis. The patient denied having any significant weight loss or night sweats. He has no headache or visual changes. He has no nausea, vomiting, diarrhea or constipation. Family history significant for mother with rheumatoid arthritis, father had heart disease, brother had lymphoma and sister had breast cancer. The patient is a widow and has 2 children. He was accompanied today by his sister petty peoples and his daughter [name]. 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 He has a history of smoking though 0.5 pack per day for around 50 years and he quit 2 weeks ago. He also drinks alcohol every other day and no history of drug abuse.  HPI  Past Medical History  Diagnosis Date  . Gout, unspecified     foot  . HYPERLIPIDEMIA   . HYPERTENSION   . PEPTIC ULCER DISEASE   . TOBACCO ABUSE   . Type 2 diabetes mellitus ([place])     denies on 01-06-16  . COPD (chronic obstructive pulmonary disease) ([place])     due to 50+ years Smoking.  . CAD, NATIVE VESSEL     coronary stent x1-tx. Plavix use.    Past Surgical History  Procedure Laterality Date  . Coronary stent placement  s/p  . Cardiac catheterization      '06-stent placed, 6'13 - Dr. Lizbeth Bark follows  . Endobronchial ultrasound Bilateral 01/11/2016    Procedure: ENDOBRONCHIAL ULTRASOUND;  Surgeon: Collene Gobble, MD;  Location: [facility]  ENDOSCOPY;  Service: Endoscopy;  Laterality: Bilateral;    Family History  Problem Relation Age of Onset  . Cancer Sister     Uncertain type  . Cancer Brother     Uncertain type  . Arthritis Mother   . Hypertension Father     Social History Social History  Substance Use Topics  . Smoking status: Current Every Day Smoker -- 0.50 packs/day for 50 years    Types: Cigarettes  . Smokeless tobacco: Never Used  . Alcohol Use: 0.0 oz/week    0 Standard drinks or equivalent per week     Comment: 1 beer/day    No Known Allergies  Current Outpatient Prescriptions  Medication Sig Dispense Refill  . acetaminophen (TYLENOL) 500 MG tablet Take 250 mg by mouth every 6 (six) hours as needed for moderate pain.    Marland Kitchen acetaZOLAMIDE (DIAMOX) 250 MG tablet Take 1 tablet (250 mg total) by mouth daily. 30 tablet 2  . allopurinol (ZYLOPRIM) 300 MG tablet TAKE 1 TABLET BY MOUTH EVERY DAY 30 tablet 2  . aspirin 81 MG tablet Take 81 mg by mouth at bedtime.     Marland Kitchen atorvastatin (LIPITOR) 80 MG tablet Take 1 tablet (80 mg total) by mouth at bedtime. 30 tablet 11  . clopidogrel (PLAVIX) 75 MG tablet Take 1 tablet (75 mg total) by mouth daily. Please Restart this medication on Tuesday 01/12/16. 30 tablet 10  . KLOR-CON M10 10 MEQ tablet     . losartan-hydrochlorothiazide (HYZAAR) 100-25 MG per tablet TAKE 1 TABLET BY MOUTH EVERY DAY 30 tablet 3  . methocarbamol (ROBAXIN) 500 MG tablet Take 500 mg by mouth every morning.     . mometasone-formoterol (DULERA) 200-5 MCG/ACT AERO Inhale 2 puffs into the lungs 2 (two) times daily. 1 Inhaler 6  . potassium chloride (K-DUR) 10 MEQ tablet Take 1 tablet (10 mEq total) by mouth daily. 30 tablet 11  . sildenafil (VIAGRA) 100 MG tablet Take 1 tablet (100 mg total) by mouth as needed for erectile dysfunction. 1 tablet 4  . SYMBICORT 160-4.5 MCG/ACT inhaler INHALE 2 PUFFS INTO THE LUNGS 2 (TWO) TIMES DAILY.  2  . prochlorperazine (COMPAZINE) 10 MG tablet Take 1 tablet (10 mg  total) by mouth every 6 (six) hours as needed for nausea or vomiting. 30 tablet 0   No current facility-administered medications for this visit.    Review of Systems  Constitutional: positive for fatigue Eyes: negative Ears, nose, mouth, throat, and face: negative Respiratory: positive for cough and dyspnea on exertion Cardiovascular: negative Gastrointestinal: negative Genitourinary:negative Integument/breast: negative Hematologic/lymphatic: negative Musculoskeletal:negative Neurological: negative Behavioral/Psych: negative Endocrine: negative Allergic/Immunologic: negative  Physical Exam  ZOX:WRUEA, healthy, no distress, well nourished and well developed SKIN: skin color, texture, turgor are normal, no rashes or significant lesions HEAD: Normocephalic, No masses, lesions, tenderness or abnormalities EYES: normal, PERRLA, Conjunctiva are pink and non-injected EARS: External ears normal, Canals clear OROPHARYNX:no exudate, no erythema and lips, buccal mucosa, and tongue normal  NECK: supple, no adenopathy, no JVD LYMPH:  no palpable lymphadenopathy, no hepatosplenomegaly LUNGS: clear to auscultation , and palpation HEART: regular rate & rhythm, no murmurs and no gallops ABDOMEN:abdomen soft, non-tender, normal bowel sounds and no masses or organomegaly BACK: Back symmetric, no curvature., No CVA  tenderness EXTREMITIES:no joint deformities, effusion, or inflammation, no edema, no skin discoloration  NEURO: alert & oriented x 3 with fluent speech, no focal motor/sensory deficits  PERFORMANCE STATUS: ECOG 1  LABORATORY DATA: Lab Results  Component Value Date   WBC 8.4 01/21/2016   HGB 14.8 01/21/2016   HCT 45.5 01/21/2016   MCV 97.4 01/21/2016   PLT 245 01/21/2016      Chemistry      Component Value Date/Time   NA 140 01/21/2016 1342   NA 141 09/11/2015 0911   K 3.8 01/21/2016 1342   K 3.7 09/11/2015 0911   CL 95* 09/11/2015 0911   CO2 31* 01/21/2016 1342    CO2 42* 09/11/2015 0911   BUN 15.3 01/21/2016 1342   BUN 17 09/11/2015 0911   CREATININE 0.8 01/21/2016 1342   CREATININE 0.84 09/11/2015 0911      Component Value Date/Time   CALCIUM 9.0 01/21/2016 1342   CALCIUM 8.5 09/11/2015 0911   ALKPHOS 90 01/21/2016 1342   ALKPHOS 79 11/14/2014 1015   AST 23 01/21/2016 1342   AST 26 11/14/2014 1015   ALT 19 01/21/2016 1342   ALT 19 11/14/2014 1015   BILITOT 0.89 01/21/2016 1342   BILITOT 1.0 11/14/2014 1015       RADIOGRAPHIC STUDIES: No results found.  ASSESSMENT: This is a very pleasant 75 years old African-American male recently diagnosed with a stage IIA (T1b, N1, M0) non-small cell lung cancer, squamous cell carcinoma presented with right upper lobe lung nodule and hilar lymphadenopathy diagnosed in January 2017. The patient also has a questionable synchronous lesion in the left lower lobe but this was not biopsied.   PLAN: I had a lengthy discussion with the patient and his family today about his current disease stage, prognosis and treatment options. I recommended for the patient to complete the staging workup by ordering a MRI of the brain to rule out brain metastasis. I discussed with the patient treatment for the stage II a non-small cell lung cancer involving the right lung. We will continue to monitor the left lower lobe nodule closely on upcoming scan. I recommended for the patient a course of concurrent chemoradiation with weekly carboplatin for AUC of 2 and paclitaxel 45 MG/M2. I discussed with the patient adverse effect of the chemotherapy including but not limited to alopecia, myelosuppression, nausea and vomiting, peripheral neuropathy, liver or renal dysfunction. I will arrange for the patient to have a chemotherapy education class before starting the first dose of his chemotherapy. I will call his pharmacy with prescription for Compazine 10 mg by mouth every 6 hours as needed for nausea. For the pelvic lymphadenopathy and  an questionable prostatitis, I will repeat PSA for evaluation of his disease. I would consider referring him to urology if there is any significant rise in his PSA. The patient is expected to start the first dose of his concurrent chemoradiation on 02/01/2016. He will be seen later today by Dr. Pablo Ledger for evaluation and discussion of the radiotherapy option. The patient was seen during the multidisciplinary thoracic oncology clinic today by medical oncology, radiation oncology, thoracic navigator, social worker and physical therapist. He would come back for follow-up visit in 3 weeks for reevaluation and management of any adverse effect of his treatment. The patient was advised to call immediately if he has any concerning symptoms in the interval.  The patient voices understanding of current disease status and treatment options and is in agreement with the current care plan.  All  questions were answered. The patient knows to call the clinic with any problems, questions or concerns. We can certainly see the patient much sooner if necessary.  Thank you so much for allowing me to participate in the care of Alie [employer]. I will continue to follow up the patient with you and assist in his care.  I spent 55 minutes counseling the patient face to face. The total time spent in the appointment was 80 minutes.  Disclaimer: This note was dictated with voice recognition software. Similar sounding words can inadvertently be transcribed and may not be corrected upon review.   Hildy Nicholl K. January 21, 2016, 2:50 PM

## 2016-01-21 NOTE — Telephone Encounter (Signed)
Oncology Nurse Navigator Documentation  Oncology Nurse Navigator Flowsheets 01/21/2016  Navigator Encounter Type Telephone/per Cancer Conference this am, it was requested patient be seen with Rad Onc today.  I called [name] and change appt time.  He verbalized understanding of appt  Treatment Phase Abnormal Scans  Barriers/Navigation Needs Coordination of Care  Interventions Coordination of Care  Coordination of Care Appts  Acuity Level 1  Time Spent with Patient 15

## 2016-01-21 NOTE — Progress Notes (Signed)
Radiation Oncology         [phone]) [phone] ________________________________  Initial outpatient Consultation - Date: 01/21/2016   Name: Ryan Contreras MRN: [id number]   DOB: [birth year]  REFERRING PHYSICIAN: Collene Gobble, MD  DIAGNOSIS AND STAGE: T1 N1 Non-Small Cell Carcinoma of the Right Lung.   HISTORY OF PRESENT ILLNESS::Ryan Contreras is a 75 y.o. male with T1 N1 Non-Small Cell Carcinoma of the right Lung. He underwent a low dose screening CT scan on 11/26/2015 after being referred by his physician. This showed multiple pulmonary nodules, the largest measuring 2.1 cm in the medial aspect of the right upper lobe. This was contiguous with some soft tissue thickening of the right hilum suspicious for right hilar adenopathy. A PET scan was performed on 12/11/2015 which showed a 2.7 cm nodule in the right upper lobe with an SUV of 16.4. Mild hypermetabolic adenopathy was seen in the right hilum with an SUV of 9.7. No other evidence of metistatic disease was noted. A pulmonary nodule in the medial left lower lobe measured about 1 cm with an SUV of 1.6. He was also noted to have foci of hypermetabolic activity in the prostate concerning for prostatitis or prostate carcinoma. He underwent bronchoscopy and EBUS on 01/11/16. These showed Non-Small Cell Carcinoma. After discussion in multidisciplinary thoracic conference this morning, it was felt that he would be a good candidate for preoperative chemotherapy and radiation followed by evaluation for surgical resection. He lives in [place].  He is accompanied by his sister and niece.  He would like to continue working during treatment.   PREVIOUS RADIATION THERAPY: No  Past medical, social and family history were reviewed in the electronic chart. Review of symptoms was reviewed in the electronic chart. Medications were reviewed in the electronic chart.   PHYSICAL EXAM:  Filed Vitals:   01/21/16 1515  BP: 135/70  Pulse: 103  Temp: 98.4 F  (36.9 C)  Resp: 19    He is in no acute distress. He is alert and oriented.  IMPRESSION: Ryan Contreras is 75 y.o. gentleman with T1 N1 Non-Small Cell Carcinoma of the Right Lung.   PLAN: The patient may be a good candidate for surgery following chemoradiation treatment. He is hesitant but open to discussing surgery in the future. He will receive a CT scan after chemoradiation treatment to determine if he is a good candidate for surgery.  If the patient is not a good candidate for surgery at that time, we will complete two more weeks of chemoradiation. We discussed his diagnosis and stage. We discussed cchemoradiation in the management of his disease. We discussed 5 weeks of treatment as an outpatient. We also discussed the process of simulation and the placement of tattoos. We discussed dysphagia, weight loss and fatigue as the acute side effects of radiation. We discussed damage to critical normal structures in the chest as well as the spinal cord as possible but improbable. We will schedule him for sim on 01/26/2016 and begin treatments as soon as possible.  I spent 30 minutes  face to face with the patient and more than 50% of that time was spent in counseling and/or coordination of care.   ------------------------------------------------  Thea Silversmith, MD  This document serves as a record of services personally performed by Thea Silversmith, MD. It was created on her behalf by Jenell Milliner, a trained medical scribe. The creation of this record is based on the scribe's personal observations and the provider's statements to them. This  document has been checked and approved by the attending provider.

## 2016-01-21 NOTE — Progress Notes (Signed)
[place] Clinical Social Work  Clinical Social Work met with patient/family and Futures trader at [facility] appointment to offer support and assess for psychosocial needs.  Medical oncologist reviewed patient's diagnosis and recommended treatment plan with patient/family.  Ryan Contreras was accompanied by his sister Ryan Contreras and daughter Ryan Contreras.  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  He shared he enjoys working and desires to work through treatment (his schedule is 3p-9p).  The patient/family had no questions regarding treatment and only concerns were transportation to appointment.  The patient indicated he is currently not experiencing depression, anxiety, or relating to God- but has in the past.  CSW encouraged patient to contact CSW if he begins to feels sad, overwhelmed, or anxious.    ONCBCN DISTRESS SCREENING 01/21/2016  Screening Type Initial Screening  Distress experienced in past week (1-10) 6  Emotional problem type Depression;Nervousness/Anxiety  Spiritual/Religous concerns type Relating to God  Information Concerns Type Lack of info about maintaining fitness  Referral to clinical social work Yes     Clinical Social Work briefly discussed [place] Work role and [employer] support programs/services.  Clinical Social Work encouraged patient to call with any additional questions or concerns.   [name], MSW, LCSW, OSW-C Clinical Social Worker [facility] [phone]

## 2016-01-21 NOTE — Telephone Encounter (Signed)
Appointments ade and avs printed. MRI will be scheduled by central scheduling

## 2016-01-21 NOTE — Progress Notes (Signed)
Helped patient complete lung cancer initiative application form.  I faxed

## 2016-01-21 NOTE — Therapy (Signed)
[place], [place], [address] Phone: [phone]   Fax:  [phone]  Physical Therapy Evaluation  Patient Details  Name: Ryan Contreras MRN: [id number] Date of Birth: 06-20-1941 Referring Provider: Dr. Curt Bears  Encounter Date: 01/21/2016      PT End of Session - 01/21/16 1648    Visit Number 1   Number of Visits 1   PT Start Time 8315   PT Stop Time 1520   PT Time Calculation (min) 25 min   Activity Tolerance Patient tolerated treatment well   Behavior During Therapy [facility] for tasks assessed/performed      Past Medical History  Diagnosis Date  . Gout, unspecified     foot  . HYPERLIPIDEMIA   . HYPERTENSION   . PEPTIC ULCER DISEASE   . TOBACCO ABUSE   . Type 2 diabetes mellitus ([place])     denies on 01-06-16  . COPD (chronic obstructive pulmonary disease) ([place])     due to 50+ years Smoking.  . CAD, NATIVE VESSEL     coronary stent x1-tx. Plavix use.    Past Surgical History  Procedure Laterality Date  . Coronary stent placement      s/p  . Cardiac catheterization      '06-stent placed, 6'13 - Dr. Lizbeth Bark follows  . Endobronchial ultrasound Bilateral 01/11/2016    Procedure: ENDOBRONCHIAL ULTRASOUND;  Surgeon: Collene Gobble, MD;  Location: [facility];  Service: Endoscopy;  Laterality: Bilateral;    There were no vitals filed for this visit.  Visit Diagnosis:  Abnormal posture - Plan: PT plan of care cert/re-cert  Shortness of breath on exertion - Plan: PT plan of care cert/re-cert  Decreased ROM of trunk and back - Plan: PT plan of care cert/re-cert      Subjective Assessment - 01/21/16 1527    Subjective Reports fatigue, no energy   Patient is accompained by: Family member  sister and daughter   Pertinent History Patient had abnormal low dose CT screen 11/26/15, then follow-up scans.  Has 2 cm. nodule in right upper lobe with pathology that favors squamous cell  carcinoma; 1.3 cm. left lower lobe node with pathology that was non-diagnostic; right hilar nodes.  Expected to have chemoradiation, then restaging and possible surgery.  Current smoker (25 pack-years); HTN, COPD, coronary stent.   Patient Stated Goals get info from all lung clinic providers   Currently in Pain? No/denies            [facility] PT Assessment - 01/21/16 0001    Assessment   Medical Diagnosis non-small cell lung cancer right upper lobe   Referring Provider Dr. Curt Bears   Onset Date/Surgical Date 11/26/15   Precautions   Precautions Other (comment)   Precaution Comments cancer   Restrictions   Weight Bearing Restrictions No   Balance Screen   Has the patient fallen in the past 6 months No   Has the patient had a decrease in activity level because of a fear of falling?  No   Is the patient reluctant to leave their home because of a fear of falling?  No   Home Environment   Living Environment Private residence   Living Arrangements Alone  daughter comes sometimes   Type of [place] One level   Prior Function   Level of Independence Independent   Leisure does some walking   Cognition   Overall Cognitive Status Difficult to assess  may have some difficulty following instructions   Functional Tests   Functional tests Sit to Stand   Sit to Stand   Comments 8 times in 30 seconds; mild dyspnea, leg fatigue   Posture/Postural Control   Posture/Postural Control Postural limitations   Postural Limitations Forward head;Flexed trunk;Increased lumbar lordosis   ROM / Strength   AROM / PROM / Strength AROM   AROM   Overall AROM Comments in standing trunk AROM:  flexion 8 inches fingers to floor; extension 50% loss; sidebend WFL bilat.; roations 25% loss bilat.   Ambulation/Gait   Ambulation/Gait Yes   Ambulation/Gait Assistance 7: Independent  reports some SOB   Balance   Balance Assessed Yes   Dynamic Standing Balance   Dynamic Standing - Comments  reaches 9 inches forward in standing                           PT Education - 2016-02-11 1646    Education provided Yes   Education Details posture, breathing, energy conservation, walking or other exercise, PT info, staying active, cough splinting   Person(s) Educated Patient;Child(ren);Other (comment)  sister   Methods Explanation;Handout   Comprehension Verbalized understanding               Lung Clinic Goals - 2016/02/11 1727    Patient will be able to verbalize understanding of the benefit of exercise to decrease fatigue.   Status Achieved   Patient will be able to verbalize the importance of posture.   Status Achieved   Patient will be able to demonstrate diaphragmatic breathing for improved lung function.   Status Achieved   Patient will be able to verbalize understanding of the role of physical therapy to prevent functional decline and who to contact if physical therapy is needed.   Status Achieved             Plan - 2016/02/11 1720    Clinical Impression Statement Newly diagnosed with non-small cell lung cancer and expected to undergo treatment with chemoradiation.  Patient c/o fatigue with everyday activities; he had mild dyspnea and leg fatigue with 30 second sit to/from standing;also has some trunk ROM limitations and posture impairments.   Pt will benefit from skilled therapeutic intervention in order to improve on the following deficits Cardiopulmonary status limiting activity;Decreased endurance;Postural dysfunction;Decreased range of motion   Rehab Potential Good   PT Frequency One time visit   PT Treatment/Interventions Patient/family education   PT Next Visit Plan None at this time; may benefit from therapy for endurance and breathing.   PT Home Exercise Plan see education section   Consulted and Agree with Plan of Care Patient          G-Codes - 02-11-16 1727    Functional Assessment Tool Used clinical judgement   Functional  Limitation Mobility: Walking and moving around   Mobility: Walking and Moving Around Current Status [phone]) At least 20 percent but less than 40 percent impaired, limited or restricted   Mobility: Walking and Moving Around Goal Status [phone]) At least 20 percent but less than 40 percent impaired, limited or restricted   Mobility: Walking and Moving Around Discharge Status [phone]) At least 20 percent but less than 40 percent impaired, limited or restricted       Problem List Patient Active Problem List   Diagnosis Date Noted  . Non-small cell carcinoma of lung, stage 2 ([place]) 02-11-16  . Hilar adenopathy 01/11/2016  . Enlargement  of lymph nodes   . Other nonspecific abnormal finding of lung field   . Pulmonary nodules 11/26/2015  . COPD mixed type ([place]) 11/02/2015  . Cigarette smoker 11/02/2015  . Dyspnea 09/11/2015  . Edema 08/14/2015  . Diabetes ([place]) 04/13/2015  . Wart viral 01/30/2015  . Wellness examination 11/14/2014  . Neoplasm of uncertain behavior of skin 12/31/2012  . Bruise 07/21/2012  . Otitis media 04/19/2012  . Otitis externa 04/19/2012  . Cough 01/06/2012  . Encounter for long-term (current) use of other medications 12/16/2011  . COPD (chronic obstructive pulmonary disease) with emphysema ([place]) 04/18/2011  . PSA, INCREASED 08/11/2010  . SKIN RASH 07/14/2010  . TOBACCO ABUSE 03/15/2010  . CAD, NATIVE VESSEL 03/15/2010  . OTHER MALAISE AND FATIGUE 03/15/2010  . OTHER SPEC HYPERTROPHIC&ATROPHIC CONDITION SKIN 10/01/2009  . HYPOKALEMIA 06/03/2009  . Hyperlipidemia 05/14/2008  . Essential hypertension 05/14/2008  . CORONARY ARTERY DISEASE 05/14/2008  . PEPTIC ULCER DISEASE 05/14/2008  . LEG PAIN 05/14/2008  . Gout 01/21/2008    SALISBURY,DONNA 01/21/2016, 5:32 PM  [place], [place], [address] Phone: [phone]   Fax:  [phone]  Name: Ryan Contreras MRN: [id number] Date of  Birth: 01-Dec-1941   Serafina Royals, PT 01/21/2016 5:32 PM

## 2016-01-23 DIAGNOSIS — N411 Chronic prostatitis: Secondary | ICD-10-CM | POA: Insufficient documentation

## 2016-01-25 ENCOUNTER — Encounter: Payer: Self-pay | Admitting: *Deleted

## 2016-01-25 ENCOUNTER — Telehealth: Payer: Self-pay | Admitting: *Deleted

## 2016-01-25 NOTE — Progress Notes (Signed)
Oncology Nurse Navigator Documentation  Oncology Nurse Navigator Flowsheets 01/25/2016  Navigator Encounter Type Other/I received an email from [place].  I was notified that Ryan Contreras gas card application needed to be re-faxed they were unable to read.  I re-faxed with confirmation fax.   Treatment Phase Abnormal Scans  Barriers/Navigation Needs Financial  Interventions Coordination of Care  Coordination of Care Other  Acuity Level 1  Time Spent with Patient 30

## 2016-01-25 NOTE — Telephone Encounter (Signed)
Oncology Nurse Navigator Documentation  Oncology Nurse Navigator Flowsheets 01/25/2016  Navigator Location [facility]  Navigator Encounter Type Telephone/I followed up from thoracic clinic to see if Mr. Cleland had appt for tx and scans.  His MRI Brain has not been scheduled.  I called precert and his scan is authorized to schedule.  I called central scheduling received time and date.  I called Mr. Vanzanten to notify of appt for MRI Brain and remind him of SIM and chemo education appt.  He verbalized understanding of appts  Telephone Outgoing Call  Abnormal Finding Date 11/26/2015  Confirmed Diagnosis Date 01/11/2016  Treatment Initiated Date 01/26/2016  Treatment Phase Abnormal Scans  Barriers/Navigation Needs Coordination of Care  Interventions Coordination of Care  Coordination of Care Appts  Acuity Level 2  Time Spent with Patient 30

## 2016-01-26 ENCOUNTER — Ambulatory Visit
Admission: RE | Admit: 2016-01-26 | Discharge: 2016-01-26 | Disposition: A | Discharge status: Home / Self Care | Payer: BC Managed Care – PPO | Source: Ambulatory Visit | Attending: Radiation Oncology | Admitting: Radiation Oncology

## 2016-01-26 ENCOUNTER — Ambulatory Visit
Admission: RE | Admit: 2016-01-26 | Payer: BC Managed Care – PPO | Source: Ambulatory Visit | Admitting: Radiation Oncology

## 2016-01-26 ENCOUNTER — Encounter: Payer: Self-pay | Admitting: *Deleted

## 2016-01-26 ENCOUNTER — Other Ambulatory Visit: Payer: BC Managed Care – PPO

## 2016-01-28 ENCOUNTER — Telehealth: Payer: Self-pay | Admitting: *Deleted

## 2016-01-28 DIAGNOSIS — C3481 Malignant neoplasm of overlapping sites of right bronchus and lung: Secondary | ICD-10-CM | POA: Diagnosis not present

## 2016-01-28 NOTE — Telephone Encounter (Signed)
Ryan Contreras is scheduled to see Dr. Orlene Erm on 01/28/16 in [place]

## 2016-02-01 ENCOUNTER — Other Ambulatory Visit (HOSPITAL_BASED_OUTPATIENT_CLINIC_OR_DEPARTMENT_OTHER): Payer: BC Managed Care – PPO

## 2016-02-01 ENCOUNTER — Other Ambulatory Visit: Payer: Self-pay | Admitting: *Deleted

## 2016-02-01 ENCOUNTER — Ambulatory Visit (HOSPITAL_BASED_OUTPATIENT_CLINIC_OR_DEPARTMENT_OTHER): Payer: BC Managed Care – PPO

## 2016-02-01 VITALS — BP 136/58 | HR 94 | Temp 99.2°F | Resp 20

## 2016-02-01 DIAGNOSIS — N411 Chronic prostatitis: Secondary | ICD-10-CM | POA: Diagnosis not present

## 2016-02-01 DIAGNOSIS — Z5111 Encounter for antineoplastic chemotherapy: Secondary | ICD-10-CM

## 2016-02-01 DIAGNOSIS — C3491 Malignant neoplasm of unspecified part of right bronchus or lung: Secondary | ICD-10-CM

## 2016-02-01 DIAGNOSIS — C3411 Malignant neoplasm of upper lobe, right bronchus or lung: Secondary | ICD-10-CM | POA: Diagnosis not present

## 2016-02-01 DIAGNOSIS — C3490 Malignant neoplasm of unspecified part of unspecified bronchus or lung: Secondary | ICD-10-CM | POA: Diagnosis not present

## 2016-02-01 DIAGNOSIS — Z510 Encounter for antineoplastic radiation therapy: Secondary | ICD-10-CM | POA: Diagnosis not present

## 2016-02-01 DIAGNOSIS — C3481 Malignant neoplasm of overlapping sites of right bronchus and lung: Secondary | ICD-10-CM | POA: Diagnosis not present

## 2016-02-01 DIAGNOSIS — R972 Elevated prostate specific antigen [PSA]: Secondary | ICD-10-CM | POA: Diagnosis not present

## 2016-02-01 DIAGNOSIS — R918 Other nonspecific abnormal finding of lung field: Secondary | ICD-10-CM | POA: Diagnosis not present

## 2016-02-01 LAB — CBC WITH DIFFERENTIAL/PLATELET
BASO%: 0.4 % (ref 0.0–2.0)
BASOS ABS: 0 10*3/uL (ref 0.0–0.1)
EOS ABS: 0 10*3/uL (ref 0.0–0.5)
EOS%: 0.4 % (ref 0.0–7.0)
HEMATOCRIT: 40.1 % (ref 38.4–49.9)
HGB: 13.3 g/dL (ref 13.0–17.1)
LYMPH#: 2.1 10*3/uL (ref 0.9–3.3)
LYMPH%: 19.3 % (ref 14.0–49.0)
MCH: 32.1 pg (ref 27.2–33.4)
MCHC: 33.2 g/dL (ref 32.0–36.0)
MCV: 96.9 fL (ref 79.3–98.0)
MONO#: 1.4 10*3/uL — AB (ref 0.1–0.9)
MONO%: 13 % (ref 0.0–14.0)
NEUT#: 7.3 10*3/uL — ABNORMAL HIGH (ref 1.5–6.5)
NEUT%: 66.9 % (ref 39.0–75.0)
PLATELETS: 204 10*3/uL (ref 140–400)
RBC: 4.14 10*6/uL — ABNORMAL LOW (ref 4.20–5.82)
RDW: 14.9 % — ABNORMAL HIGH (ref 11.0–14.6)
WBC: 10.9 10*3/uL — ABNORMAL HIGH (ref 4.0–10.3)

## 2016-02-01 LAB — COMPREHENSIVE METABOLIC PANEL WITH GFR
ALT: 22 U/L (ref 0–55)
AST: 31 U/L (ref 5–34)
Albumin: 3.3 g/dL — ABNORMAL LOW (ref 3.5–5.0)
Alkaline Phosphatase: 80 U/L (ref 40–150)
Anion Gap: 9 meq/L (ref 3–11)
BUN: 13.2 mg/dL (ref 7.0–26.0)
CO2: 36 meq/L — ABNORMAL HIGH (ref 22–29)
Calcium: 8.6 mg/dL (ref 8.4–10.4)
Chloride: 92 meq/L — ABNORMAL LOW (ref 98–109)
Creatinine: 0.8 mg/dL (ref 0.7–1.3)
EGFR: >90 ml/min/1.73 m2 (ref >90)
Glucose: 138 mg/dL (ref 70–140)
Potassium: 3.5 meq/L (ref 3.5–5.1)
Sodium: 137 meq/L (ref 136–145)
Total Bilirubin: 0.84 mg/dL (ref 0.20–1.20)
Total Protein: 7.2 g/dL (ref 6.4–8.3)

## 2016-02-01 MED ORDER — SODIUM CHLORIDE 0.9 % IV SOLN
20.0000 mg | Freq: Once | INTRAVENOUS | Status: AC
  Administered 2016-02-01: 20 mg via INTRAVENOUS
  Filled 2016-02-01: qty 2

## 2016-02-01 MED ORDER — PALONOSETRON HCL INJECTION 0.25 MG/5ML
0.2500 mg | Freq: Once | INTRAVENOUS | Status: AC
  Administered 2016-02-01: 0.25 mg via INTRAVENOUS

## 2016-02-01 MED ORDER — DIPHENHYDRAMINE HCL 50 MG/ML IJ SOLN
50.0000 mg | Freq: Once | INTRAMUSCULAR | Status: AC
  Administered 2016-02-01: 50 mg via INTRAVENOUS

## 2016-02-01 MED ORDER — SODIUM CHLORIDE 0.9 % IV SOLN
202.2000 mg | Freq: Once | INTRAVENOUS | Status: AC
  Administered 2016-02-01: 200 mg via INTRAVENOUS
  Filled 2016-02-01: qty 20

## 2016-02-01 MED ORDER — PALONOSETRON HCL INJECTION 0.25 MG/5ML
INTRAVENOUS | Status: AC
  Filled 2016-02-01: qty 5

## 2016-02-01 MED ORDER — FAMOTIDINE IN NACL 20-0.9 MG/50ML-% IV SOLN
INTRAVENOUS | Status: AC
Start: 2016-02-01 — End: 2016-02-01
  Filled 2016-02-01: qty 50

## 2016-02-01 MED ORDER — DIPHENHYDRAMINE HCL 50 MG/ML IJ SOLN
INTRAMUSCULAR | Status: AC
  Filled 2016-02-01: qty 1

## 2016-02-01 MED ORDER — SODIUM CHLORIDE 0.9 % IV SOLN
Freq: Once | INTRAVENOUS | Status: AC
  Administered 2016-02-01: 13:00:00 via INTRAVENOUS

## 2016-02-01 MED ORDER — PACLITAXEL CHEMO INJECTION 300 MG/50ML
45.0000 mg/m2 | Freq: Once | INTRAVENOUS | Status: AC
  Administered 2016-02-01: 90 mg via INTRAVENOUS
  Filled 2016-02-01: qty 15

## 2016-02-01 MED ORDER — FAMOTIDINE IN NACL 20-0.9 MG/50ML-% IV SOLN
20.0000 mg | Freq: Once | INTRAVENOUS | Status: AC
  Administered 2016-02-01: 20 mg via INTRAVENOUS

## 2016-02-01 NOTE — Patient Instructions (Signed)
[facility] Discharge Instructions for Patients Receiving Chemotherapy  Today you received the following chemotherapy agents Taxol/ Carboplatin  To help prevent nausea and vomiting after your treatment, we encourage you to take your nausea medication compazine as directed by your doctor.  If you develop nausea and vomiting that is not controlled by your nausea medication, call the clinic.   BELOW ARE SYMPTOMS THAT SHOULD BE REPORTED IMMEDIATELY:  *FEVER GREATER THAN 100.5 F  *CHILLS WITH OR WITHOUT FEVER  NAUSEA AND VOMITING THAT IS NOT CONTROLLED WITH YOUR NAUSEA MEDICATION  *UNUSUAL SHORTNESS OF BREATH  *UNUSUAL BRUISING OR BLEEDING  TENDERNESS IN MOUTH AND THROAT WITH OR WITHOUT PRESENCE OF ULCERS  *URINARY PROBLEMS  *BOWEL PROBLEMS  UNUSUAL RASH Items with * indicate a potential emergency and should be followed up as soon as possible.  Feel free to call the clinic you have any questions or concerns. The clinic phone number is [phone].  Please show the [place] at check-in to the Emergency Department and triage nurse.  Paclitaxel injection What is this medicine? PACLITAXEL (PAK li TAX el) is a chemotherapy drug. It targets fast dividing cells, like cancer cells, and causes these cells to die. This medicine is used to treat ovarian cancer, breast cancer, and other cancers. This medicine may be used for other purposes; ask your health care provider or pharmacist if you have questions. What should I tell my health care provider before I take this medicine? They need to know if you have any of these conditions: -blood disorders -irregular heartbeat -infection (especially a virus infection such as chickenpox, cold sores, or herpes) -liver disease -previous or ongoing radiation therapy -an unusual or allergic reaction to paclitaxel, alcohol, polyoxyethylated castor oil, other chemotherapy agents, other medicines, foods, dyes, or  preservatives -pregnant or trying to get pregnant -breast-feeding How should I use this medicine? This drug is given as an infusion into a vein. It is administered in a hospital or clinic by a specially trained health care professional. Talk to your pediatrician regarding the use of this medicine in children. Special care may be needed. Overdosage: If you think you have taken too much of this medicine contact a poison control center or emergency room at once. NOTE: This medicine is only for you. Do not share this medicine with others. What if I miss a dose? It is important not to miss your dose. Call your doctor or health care professional if you are unable to keep an appointment. What may interact with this medicine? Do not take this medicine with any of the following medications: -disulfiram -metronidazole This medicine may also interact with the following medications: -cyclosporine -diazepam -ketoconazole -medicines to increase blood counts like filgrastim, pegfilgrastim, sargramostim -other chemotherapy drugs like cisplatin, doxorubicin, epirubicin, etoposide, teniposide, vincristine -quinidine -testosterone -vaccines -verapamil Talk to your doctor or health care professional before taking any of these medicines: -acetaminophen -aspirin -ibuprofen -ketoprofen -naproxen This list may not describe all possible interactions. Give your health care provider a list of all the medicines, herbs, non-prescription drugs, or dietary supplements you use. Also tell them if you smoke, drink alcohol, or use illegal drugs. Some items may interact with your medicine. What should I watch for while using this medicine? Your condition will be monitored carefully while you are receiving this medicine. You will need important blood work done while you are taking this medicine. This drug may make you feel generally unwell. This is not uncommon, as chemotherapy can affect healthy cells  as well as cancer  cells. Report any side effects. Continue your course of treatment even though you feel ill unless your doctor tells you to stop. This medicine can cause serious allergic reactions. To reduce your risk you will need to take other medicine(s) before treatment with this medicine. In some cases, you may be given additional medicines to help with side effects. Follow all directions for their use. Call your doctor or health care professional for advice if you get a fever, chills or sore throat, or other symptoms of a cold or flu. Do not treat yourself. This drug decreases your body's ability to fight infections. Try to avoid being around people who are sick. This medicine may increase your risk to bruise or bleed. Call your doctor or health care professional if you notice any unusual bleeding. Be careful brushing and flossing your teeth or using a toothpick because you may get an infection or bleed more easily. If you have any dental work done, tell your dentist you are receiving this medicine. Avoid taking products that contain aspirin, acetaminophen, ibuprofen, naproxen, or ketoprofen unless instructed by your doctor. These medicines may hide a fever. Do not become pregnant while taking this medicine. Women should inform their doctor if they wish to become pregnant or think they might be pregnant. There is a potential for serious side effects to an unborn child. Talk to your health care professional or pharmacist for more information. Do not breast-feed an infant while taking this medicine. Men are advised not to father a child while receiving this medicine. This product may contain alcohol. Ask your pharmacist or healthcare provider if this medicine contains alcohol. Be sure to tell all healthcare providers you are taking this medicine. Certain medicines, like metronidazole and disulfiram, can cause an unpleasant reaction when taken with alcohol. The reaction includes flushing, headache, nausea, vomiting,  sweating, and increased thirst. The reaction can last from 30 minutes to several hours. What side effects may I notice from receiving this medicine? Side effects that you should report to your doctor or health care professional as soon as possible: -allergic reactions like skin rash, itching or hives, swelling of the face, lips, or tongue -low blood counts - This drug may decrease the number of white blood cells, red blood cells and platelets. You may be at increased risk for infections and bleeding. -signs of infection - fever or chills, cough, sore throat, pain or difficulty passing urine -signs of decreased platelets or bleeding - bruising, pinpoint red spots on the skin, black, tarry stools, nosebleeds -signs of decreased red blood cells - unusually weak or tired, fainting spells, lightheadedness -breathing problems -chest pain -high or low blood pressure -mouth sores -nausea and vomiting -pain, swelling, redness or irritation at the injection site -pain, tingling, numbness in the hands or feet -slow or irregular heartbeat -swelling of the ankle, feet, hands Side effects that usually do not require medical attention (report to your doctor or health care professional if they continue or are bothersome): -bone pain -complete hair loss including hair on your head, underarms, pubic hair, eyebrows, and eyelashes -changes in the color of fingernails -diarrhea -loosening of the fingernails -loss of appetite -muscle or joint pain -red flush to skin -sweating This list may not describe all possible side effects. Call your doctor for medical advice about side effects. You may report side effects to FDA at 1-800-FDA-1088. Where should I keep my medicine? This drug is given in a hospital or clinic and will not be  stored at home. NOTE: This sheet is a summary. It may not cover all possible information. If you have questions about this medicine, talk to your doctor, pharmacist, or health care  provider.    2016, Elsevier/Gold Standard. (2015-07-16 13:02:56)    Carboplatin injection What is this medicine? CARBOPLATIN (KAR boe pla tin) is a chemotherapy drug. It targets fast dividing cells, like cancer cells, and causes these cells to die. This medicine is used to treat ovarian cancer and many other cancers. This medicine may be used for other purposes; ask your health care provider or pharmacist if you have questions. What should I tell my health care provider before I take this medicine? They need to know if you have any of these conditions: -blood disorders -hearing problems -kidney disease -recent or ongoing radiation therapy -an unusual or allergic reaction to carboplatin, cisplatin, other chemotherapy, other medicines, foods, dyes, or preservatives -pregnant or trying to get pregnant -breast-feeding How should I use this medicine? This drug is usually given as an infusion into a vein. It is administered in a hospital or clinic by a specially trained health care professional. Talk to your pediatrician regarding the use of this medicine in children. Special care may be needed. Overdosage: If you think you have taken too much of this medicine contact a poison control center or emergency room at once. NOTE: This medicine is only for you. Do not share this medicine with others. What if I miss a dose? It is important not to miss a dose. Call your doctor or health care professional if you are unable to keep an appointment. What may interact with this medicine? -medicines for seizures -medicines to increase blood counts like filgrastim, pegfilgrastim, sargramostim -some antibiotics like amikacin, gentamicin, neomycin, streptomycin, tobramycin -vaccines Talk to your doctor or health care professional before taking any of these medicines: -acetaminophen -aspirin -ibuprofen -ketoprofen -naproxen This list may not describe all possible interactions. Give your health care  provider a list of all the medicines, herbs, non-prescription drugs, or dietary supplements you use. Also tell them if you smoke, drink alcohol, or use illegal drugs. Some items may interact with your medicine. What should I watch for while using this medicine? Your condition will be monitored carefully while you are receiving this medicine. You will need important blood work done while you are taking this medicine. This drug may make you feel generally unwell. This is not uncommon, as chemotherapy can affect healthy cells as well as cancer cells. Report any side effects. Continue your course of treatment even though you feel ill unless your doctor tells you to stop. In some cases, you may be given additional medicines to help with side effects. Follow all directions for their use. Call your doctor or health care professional for advice if you get a fever, chills or sore throat, or other symptoms of a cold or flu. Do not treat yourself. This drug decreases your body's ability to fight infections. Try to avoid being around people who are sick. This medicine may increase your risk to bruise or bleed. Call your doctor or health care professional if you notice any unusual bleeding. Be careful brushing and flossing your teeth or using a toothpick because you may get an infection or bleed more easily. If you have any dental work done, tell your dentist you are receiving this medicine. Avoid taking products that contain aspirin, acetaminophen, ibuprofen, naproxen, or ketoprofen unless instructed by your doctor. These medicines may hide a fever. Do not become pregnant  while taking this medicine. Women should inform their doctor if they wish to become pregnant or think they might be pregnant. There is a potential for serious side effects to an unborn child. Talk to your health care professional or pharmacist for more information. Do not breast-feed an infant while taking this medicine. What side effects may I  notice from receiving this medicine? Side effects that you should report to your doctor or health care professional as soon as possible: -allergic reactions like skin rash, itching or hives, swelling of the face, lips, or tongue -signs of infection - fever or chills, cough, sore throat, pain or difficulty passing urine -signs of decreased platelets or bleeding - bruising, pinpoint red spots on the skin, black, tarry stools, nosebleeds -signs of decreased red blood cells - unusually weak or tired, fainting spells, lightheadedness -breathing problems -changes in hearing -changes in vision -chest pain -high blood pressure -low blood counts - This drug may decrease the number of white blood cells, red blood cells and platelets. You may be at increased risk for infections and bleeding. -nausea and vomiting -pain, swelling, redness or irritation at the injection site -pain, tingling, numbness in the hands or feet -problems with balance, talking, walking -trouble passing urine or change in the amount of urine Side effects that usually do not require medical attention (report to your doctor or health care professional if they continue or are bothersome): -hair loss -loss of appetite -metallic taste in the mouth or changes in taste This list may not describe all possible side effects. Call your doctor for medical advice about side effects. You may report side effects to FDA at 1-800-FDA-1088. Where should I keep my medicine? This drug is given in a hospital or clinic and will not be stored at home. NOTE: This sheet is a summary. It may not cover all possible information. If you have questions about this medicine, talk to your doctor, pharmacist, or health care provider.    2016, Elsevier/Gold Standard. (2008-03-04 14:38:05)

## 2016-02-01 NOTE — Progress Notes (Signed)
Pt tolerated first time chemo taxol and carboplatin. No complications reported. Placed in patient follow up call for tomorrow. Pt AVS and schedule printed out and updated by scheduler. Pt aware of what to look for after chemo and medications that he can take for nausea/vomiting. Pt also understands when to call for help if he has a concern about his chemo.

## 2016-02-02 ENCOUNTER — Ambulatory Visit (HOSPITAL_COMMUNITY): Admission: RE | Admit: 2016-02-02 | Payer: BC Managed Care – PPO | Source: Ambulatory Visit

## 2016-02-02 ENCOUNTER — Telehealth: Payer: Self-pay | Admitting: Medical Oncology

## 2016-02-02 LAB — PSA: Prostate Specific Ag, Serum: 10.2 ng/mL — ABNORMAL HIGH (ref 0.0–4.0)

## 2016-02-02 NOTE — Telephone Encounter (Signed)
"  I just got up and ate breakfast. Can I go to work today?" . 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 I told him to let us know if he needs reduced hours or has any side effects that interfere with his job.

## 2016-02-04 ENCOUNTER — Ambulatory Visit (HOSPITAL_COMMUNITY)
Admission: RE | Admit: 2016-02-04 | Discharge: 2016-02-04 | Disposition: A | Discharge status: Home / Self Care | Payer: BC Managed Care – PPO | Source: Ambulatory Visit | Attending: Internal Medicine | Admitting: Internal Medicine

## 2016-02-04 DIAGNOSIS — N411 Chronic prostatitis: Secondary | ICD-10-CM | POA: Insufficient documentation

## 2016-02-04 DIAGNOSIS — I739 Peripheral vascular disease, unspecified: Secondary | ICD-10-CM | POA: Diagnosis not present

## 2016-02-04 DIAGNOSIS — C3491 Malignant neoplasm of unspecified part of right bronchus or lung: Secondary | ICD-10-CM | POA: Diagnosis not present

## 2016-02-04 DIAGNOSIS — C3490 Malignant neoplasm of unspecified part of unspecified bronchus or lung: Secondary | ICD-10-CM | POA: Diagnosis not present

## 2016-02-04 MED ORDER — GADOBENATE DIMEGLUMINE 529 MG/ML IV SOLN
15.0000 mL | Freq: Once | INTRAVENOUS | Status: AC | PRN
  Administered 2016-02-04: 15 mL via INTRAVENOUS

## 2016-02-05 ENCOUNTER — Other Ambulatory Visit: Payer: Self-pay | Admitting: Medical Oncology

## 2016-02-05 DIAGNOSIS — Z510 Encounter for antineoplastic radiation therapy: Secondary | ICD-10-CM | POA: Diagnosis not present

## 2016-02-05 DIAGNOSIS — C3481 Malignant neoplasm of overlapping sites of right bronchus and lung: Secondary | ICD-10-CM | POA: Diagnosis not present

## 2016-02-05 DIAGNOSIS — C3491 Malignant neoplasm of unspecified part of right bronchus or lung: Secondary | ICD-10-CM

## 2016-02-08 ENCOUNTER — Other Ambulatory Visit: Payer: Medicare Other

## 2016-02-08 ENCOUNTER — Ambulatory Visit (HOSPITAL_BASED_OUTPATIENT_CLINIC_OR_DEPARTMENT_OTHER): Payer: BC Managed Care – PPO

## 2016-02-08 ENCOUNTER — Ambulatory Visit: Payer: Medicare Other

## 2016-02-08 ENCOUNTER — Other Ambulatory Visit (HOSPITAL_BASED_OUTPATIENT_CLINIC_OR_DEPARTMENT_OTHER): Payer: BC Managed Care – PPO

## 2016-02-08 VITALS — BP 100/47 | HR 89 | Temp 98.1°F | Resp 18

## 2016-02-08 DIAGNOSIS — Z5111 Encounter for antineoplastic chemotherapy: Secondary | ICD-10-CM

## 2016-02-08 DIAGNOSIS — C3481 Malignant neoplasm of overlapping sites of right bronchus and lung: Secondary | ICD-10-CM | POA: Diagnosis not present

## 2016-02-08 DIAGNOSIS — C3411 Malignant neoplasm of upper lobe, right bronchus or lung: Secondary | ICD-10-CM | POA: Diagnosis not present

## 2016-02-08 DIAGNOSIS — C3491 Malignant neoplasm of unspecified part of right bronchus or lung: Secondary | ICD-10-CM

## 2016-02-08 DIAGNOSIS — Z510 Encounter for antineoplastic radiation therapy: Secondary | ICD-10-CM | POA: Diagnosis not present

## 2016-02-08 LAB — CBC WITH DIFFERENTIAL/PLATELET
BASO%: 0.7 % (ref 0.0–2.0)
BASOS ABS: 0 10*3/uL (ref 0.0–0.1)
EOS ABS: 0.1 10*3/uL (ref 0.0–0.5)
EOS%: 1.9 % (ref 0.0–7.0)
HEMATOCRIT: 39.8 % (ref 38.4–49.9)
HEMOGLOBIN: 12.7 g/dL — AB (ref 13.0–17.1)
LYMPH#: 1.9 10*3/uL (ref 0.9–3.3)
LYMPH%: 29.7 % (ref 14.0–49.0)
MCH: 31.6 pg (ref 27.2–33.4)
MCHC: 32 g/dL (ref 32.0–36.0)
MCV: 98.5 fL — AB (ref 79.3–98.0)
MONO#: 0.5 10*3/uL (ref 0.1–0.9)
MONO%: 8.1 % (ref 0.0–14.0)
NEUT%: 59.6 % (ref 39.0–75.0)
NEUTROS ABS: 3.8 10*3/uL (ref 1.5–6.5)
PLATELETS: 306 10*3/uL (ref 140–400)
RBC: 4.04 10*6/uL — ABNORMAL LOW (ref 4.20–5.82)
RDW: 15.2 % — AB (ref 11.0–14.6)
WBC: 6.4 10*3/uL (ref 4.0–10.3)

## 2016-02-08 LAB — COMPREHENSIVE METABOLIC PANEL
ALBUMIN: 3 g/dL — AB (ref 3.5–5.0)
ALK PHOS: 78 U/L (ref 40–150)
ALT: 19 U/L (ref 0–55)
ANION GAP: 7 meq/L (ref 3–11)
AST: 21 U/L (ref 5–34)
BUN: 13.5 mg/dL (ref 7.0–26.0)
CALCIUM: 8.5 mg/dL (ref 8.4–10.4)
CO2: 35 mEq/L — ABNORMAL HIGH (ref 22–29)
Chloride: 97 mEq/L — ABNORMAL LOW (ref 98–109)
Creatinine: 0.8 mg/dL (ref 0.7–1.3)
Glucose: 121 mg/dl (ref 70–140)
Potassium: 4.2 mEq/L (ref 3.5–5.1)
Sodium: 138 mEq/L (ref 136–145)
TOTAL PROTEIN: 6.7 g/dL (ref 6.4–8.3)
Total Bilirubin: 0.77 mg/dL (ref 0.20–1.20)

## 2016-02-08 MED ORDER — FAMOTIDINE IN NACL 20-0.9 MG/50ML-% IV SOLN
INTRAVENOUS | Status: AC
  Filled 2016-02-08: qty 50

## 2016-02-08 MED ORDER — FAMOTIDINE IN NACL 20-0.9 MG/50ML-% IV SOLN
20.0000 mg | Freq: Once | INTRAVENOUS | Status: AC
  Administered 2016-02-08: 20 mg via INTRAVENOUS

## 2016-02-08 MED ORDER — PACLITAXEL CHEMO INJECTION 300 MG/50ML
45.0000 mg/m2 | Freq: Once | INTRAVENOUS | Status: AC
  Administered 2016-02-08: 90 mg via INTRAVENOUS
  Filled 2016-02-08: qty 15

## 2016-02-08 MED ORDER — DIPHENHYDRAMINE HCL 50 MG/ML IJ SOLN
50.0000 mg | Freq: Once | INTRAMUSCULAR | Status: AC
  Administered 2016-02-08: 50 mg via INTRAVENOUS

## 2016-02-08 MED ORDER — PALONOSETRON HCL INJECTION 0.25 MG/5ML
0.2500 mg | Freq: Once | INTRAVENOUS | Status: AC
  Administered 2016-02-08: 0.25 mg via INTRAVENOUS

## 2016-02-08 MED ORDER — SODIUM CHLORIDE 0.9 % IV SOLN
Freq: Once | INTRAVENOUS | Status: AC
  Administered 2016-02-08: 14:00:00 via INTRAVENOUS

## 2016-02-08 MED ORDER — DIPHENHYDRAMINE HCL 50 MG/ML IJ SOLN
INTRAMUSCULAR | Status: AC
  Filled 2016-02-08: qty 1

## 2016-02-08 MED ORDER — SODIUM CHLORIDE 0.9 % IV SOLN
200.0000 mg | Freq: Once | INTRAVENOUS | Status: AC
  Administered 2016-02-08: 200 mg via INTRAVENOUS
  Filled 2016-02-08: qty 20

## 2016-02-08 MED ORDER — PALONOSETRON HCL INJECTION 0.25 MG/5ML
INTRAVENOUS | Status: AC
Start: 2016-02-08 — End: 2016-02-08
  Filled 2016-02-08: qty 5

## 2016-02-08 MED ORDER — DEXAMETHASONE SODIUM PHOSPHATE 100 MG/10ML IJ SOLN
20.0000 mg | Freq: Once | INTRAMUSCULAR | Status: AC
  Administered 2016-02-08: 20 mg via INTRAVENOUS
  Filled 2016-02-08: qty 2

## 2016-02-08 NOTE — Patient Instructions (Signed)
[facility] Discharge Instructions for Patients Receiving Chemotherapy  Today you received the following chemotherapy agents Taxol and Carboplatin.  To help prevent nausea and vomiting after your treatment, we encourage you to take your nausea medication as prescribed.   If you develop nausea and vomiting that is not controlled by your nausea medication, call the clinic.   BELOW ARE SYMPTOMS THAT SHOULD BE REPORTED IMMEDIATELY:  *FEVER GREATER THAN 100.5 F  *CHILLS WITH OR WITHOUT FEVER  NAUSEA AND VOMITING THAT IS NOT CONTROLLED WITH YOUR NAUSEA MEDICATION  *UNUSUAL SHORTNESS OF BREATH  *UNUSUAL BRUISING OR BLEEDING  TENDERNESS IN MOUTH AND THROAT WITH OR WITHOUT PRESENCE OF ULCERS  *URINARY PROBLEMS  *BOWEL PROBLEMS  UNUSUAL RASH Items with * indicate a potential emergency and should be followed up as soon as possible.  Feel free to call the clinic you have any questions or concerns. The clinic phone number is [phone].  Please show the CHEMO ALERT CARD at check-in to the Emergency Department and triage nurse.   

## 2016-02-09 DIAGNOSIS — Z510 Encounter for antineoplastic radiation therapy: Secondary | ICD-10-CM | POA: Diagnosis not present

## 2016-02-09 DIAGNOSIS — C3481 Malignant neoplasm of overlapping sites of right bronchus and lung: Secondary | ICD-10-CM | POA: Diagnosis not present

## 2016-02-12 ENCOUNTER — Other Ambulatory Visit: Payer: Self-pay | Admitting: Internal Medicine

## 2016-02-15 ENCOUNTER — Encounter: Payer: Self-pay | Admitting: Internal Medicine

## 2016-02-15 ENCOUNTER — Ambulatory Visit (HOSPITAL_BASED_OUTPATIENT_CLINIC_OR_DEPARTMENT_OTHER): Payer: BC Managed Care – PPO | Admitting: Internal Medicine

## 2016-02-15 ENCOUNTER — Telehealth: Payer: Self-pay | Admitting: *Deleted

## 2016-02-15 ENCOUNTER — Encounter: Payer: Self-pay | Admitting: *Deleted

## 2016-02-15 ENCOUNTER — Other Ambulatory Visit (HOSPITAL_BASED_OUTPATIENT_CLINIC_OR_DEPARTMENT_OTHER): Payer: BC Managed Care – PPO

## 2016-02-15 ENCOUNTER — Ambulatory Visit (HOSPITAL_BASED_OUTPATIENT_CLINIC_OR_DEPARTMENT_OTHER): Payer: BC Managed Care – PPO

## 2016-02-15 VITALS — BP 136/82 | HR 85

## 2016-02-15 VITALS — BP 109/43 | HR 99 | Temp 98.4°F | Resp 19 | Ht 73.0 in | Wt 178.8 lb

## 2016-02-15 DIAGNOSIS — C3491 Malignant neoplasm of unspecified part of right bronchus or lung: Secondary | ICD-10-CM

## 2016-02-15 DIAGNOSIS — C3411 Malignant neoplasm of upper lobe, right bronchus or lung: Secondary | ICD-10-CM

## 2016-02-15 DIAGNOSIS — Z5111 Encounter for antineoplastic chemotherapy: Secondary | ICD-10-CM

## 2016-02-15 LAB — CBC WITH DIFFERENTIAL/PLATELET
BASO%: 1.4 % (ref 0.0–2.0)
BASOS ABS: 0.1 10*3/uL (ref 0.0–0.1)
EOS ABS: 0.1 10*3/uL (ref 0.0–0.5)
EOS%: 1.4 % (ref 0.0–7.0)
HCT: 35.9 % — ABNORMAL LOW (ref 38.4–49.9)
HGB: 11.5 g/dL — ABNORMAL LOW (ref 13.0–17.1)
LYMPH%: 38.4 % (ref 14.0–49.0)
MCH: 31.8 pg (ref 27.2–33.4)
MCHC: 32 g/dL (ref 32.0–36.0)
MCV: 99.2 fL — AB (ref 79.3–98.0)
MONO#: 0.5 10*3/uL (ref 0.1–0.9)
MONO%: 12 % (ref 0.0–14.0)
NEUT#: 2 10*3/uL (ref 1.5–6.5)
NEUT%: 46.8 % (ref 39.0–75.0)
PLATELETS: 322 10*3/uL (ref 140–400)
RBC: 3.62 10*6/uL — AB (ref 4.20–5.82)
RDW: 15.3 % — AB (ref 11.0–14.6)
WBC: 4.4 10*3/uL (ref 4.0–10.3)
lymph#: 1.7 10*3/uL (ref 0.9–3.3)
nRBC: 0 % (ref 0–0)

## 2016-02-15 LAB — COMPREHENSIVE METABOLIC PANEL
ALT: 21 U/L (ref 0–55)
ANION GAP: 9 meq/L (ref 3–11)
AST: 25 U/L (ref 5–34)
Albumin: 3.3 g/dL — ABNORMAL LOW (ref 3.5–5.0)
Alkaline Phosphatase: 77 U/L (ref 40–150)
BILIRUBIN TOTAL: 0.6 mg/dL (ref 0.20–1.20)
BUN: 15.5 mg/dL (ref 7.0–26.0)
CHLORIDE: 99 meq/L (ref 98–109)
CO2: 32 meq/L — AB (ref 22–29)
Calcium: 8.7 mg/dL (ref 8.4–10.4)
Creatinine: 0.7 mg/dL (ref 0.7–1.3)
GLUCOSE: 117 mg/dL (ref 70–140)
POTASSIUM: 4.3 meq/L (ref 3.5–5.1)
SODIUM: 140 meq/L (ref 136–145)
Total Protein: 6.7 g/dL (ref 6.4–8.3)

## 2016-02-15 MED ORDER — PACLITAXEL CHEMO INJECTION 300 MG/50ML
45.0000 mg/m2 | Freq: Once | INTRAVENOUS | Status: AC
  Administered 2016-02-15: 90 mg via INTRAVENOUS
  Filled 2016-02-15: qty 15

## 2016-02-15 MED ORDER — FAMOTIDINE IN NACL 20-0.9 MG/50ML-% IV SOLN
20.0000 mg | Freq: Once | INTRAVENOUS | Status: AC
  Administered 2016-02-15: 20 mg via INTRAVENOUS

## 2016-02-15 MED ORDER — PALONOSETRON HCL INJECTION 0.25 MG/5ML
0.2500 mg | Freq: Once | INTRAVENOUS | Status: AC
  Administered 2016-02-15: 0.25 mg via INTRAVENOUS

## 2016-02-15 MED ORDER — PALONOSETRON HCL INJECTION 0.25 MG/5ML
INTRAVENOUS | Status: AC
  Filled 2016-02-15: qty 5

## 2016-02-15 MED ORDER — SODIUM CHLORIDE 0.9 % IV SOLN
Freq: Once | INTRAVENOUS | Status: AC
  Administered 2016-02-15: 12:00:00 via INTRAVENOUS

## 2016-02-15 MED ORDER — FAMOTIDINE IN NACL 20-0.9 MG/50ML-% IV SOLN
INTRAVENOUS | Status: AC
  Filled 2016-02-15: qty 50

## 2016-02-15 MED ORDER — DIPHENHYDRAMINE HCL 50 MG/ML IJ SOLN
50.0000 mg | Freq: Once | INTRAMUSCULAR | Status: AC
  Administered 2016-02-15: 50 mg via INTRAVENOUS

## 2016-02-15 MED ORDER — DIPHENHYDRAMINE HCL 50 MG/ML IJ SOLN
INTRAMUSCULAR | Status: AC
  Filled 2016-02-15: qty 1

## 2016-02-15 MED ORDER — SODIUM CHLORIDE 0.9 % IV SOLN
20.0000 mg | Freq: Once | INTRAVENOUS | Status: AC
  Administered 2016-02-15: 20 mg via INTRAVENOUS
  Filled 2016-02-15: qty 2

## 2016-02-15 MED ORDER — SODIUM CHLORIDE 0.9 % IV SOLN
202.2000 mg | Freq: Once | INTRAVENOUS | Status: AC
  Administered 2016-02-15: 200 mg via INTRAVENOUS
  Filled 2016-02-15: qty 20

## 2016-02-15 NOTE — Telephone Encounter (Signed)
Per staff message and POF I have scheduled appts. Advised scheduler of appts. JMW  

## 2016-02-15 NOTE — Progress Notes (Signed)
Oncology Nurse Navigator Documentation  Oncology Nurse Navigator Flowsheets 02/15/2016  Navigator Location -  Navigator Encounter Type Clinic/MDC/I spoke with Ryan Contreras today.  He states he is doing well with tx.  I updated him on next steps  Treatment Phase Treatment  Barriers/Navigation Needs Education  Acuity Level 1  Time Spent with Patient 15

## 2016-02-15 NOTE — Patient Instructions (Signed)
[facility] Discharge Instructions for Patients Receiving Chemotherapy  Today you received the following chemotherapy agents Taxol and Carboplatin.  To help prevent nausea and vomiting after your treatment, we encourage you to take your nausea medication as prescribed.   If you develop nausea and vomiting that is not controlled by your nausea medication, call the clinic.   BELOW ARE SYMPTOMS THAT SHOULD BE REPORTED IMMEDIATELY:  *FEVER GREATER THAN 100.5 F  *CHILLS WITH OR WITHOUT FEVER  NAUSEA AND VOMITING THAT IS NOT CONTROLLED WITH YOUR NAUSEA MEDICATION  *UNUSUAL SHORTNESS OF BREATH  *UNUSUAL BRUISING OR BLEEDING  TENDERNESS IN MOUTH AND THROAT WITH OR WITHOUT PRESENCE OF ULCERS  *URINARY PROBLEMS  *BOWEL PROBLEMS  UNUSUAL RASH Items with * indicate a potential emergency and should be followed up as soon as possible.  Feel free to call the clinic you have any questions or concerns. The clinic phone number is [phone].  Please show the CHEMO ALERT CARD at check-in to the Emergency Department and triage nurse.   

## 2016-02-15 NOTE — Progress Notes (Signed)
[place] [phone]   [phone]  OFFICE PROGRESS NOTE  Ryan Shin, MD [address] [place] [address]  DIAGNOSIS: stage IIA (T1b, N1, M0) non-small cell lung cancer, squamous cell carcinoma presented with right upper lobe lung nodule and hilar lymphadenopathy diagnosed in January 2017.  PRIOR THERAPY: None  CURRENT THERAPY: course of concurrent chemoradiation with weekly carboplatin for AUC of 2 and paclitaxel 45 MG/M2. Status post 2 cycles.  INTERVAL HISTORY: Ryan Contreras 75 y.o. male returns to the clinic today for follow-up visit accompanied by his 2 sisters. The patient is tolerating his course of concurrent chemoradiation fairly well. He denied having any significant fever or chills. He has no nausea or vomiting. He denied having any significant chest pain, shortness breath, cough or hemoptysis. He denied having any significant dysphagia or odynophagia. The patient is here today to start cycle #3 of his concurrent chemoradiation.  MEDICAL HISTORY: Past Medical History  Diagnosis Date  . Gout, unspecified     foot  . HYPERLIPIDEMIA   . HYPERTENSION   . PEPTIC ULCER DISEASE   . TOBACCO ABUSE   . Type 2 diabetes mellitus ([place])     denies on 01-06-16  . COPD (chronic obstructive pulmonary disease) ([place])     due to 50+ years Smoking.  . CAD, NATIVE VESSEL     coronary stent x1-tx. Plavix use.    ALLERGIES:  has No Known Allergies.  MEDICATIONS:  Current Outpatient Prescriptions  Medication Sig Dispense Refill  . acetaminophen (TYLENOL) 500 MG tablet Take 250 mg by mouth every 6 (six) hours as needed for moderate pain.    Marland Kitchen acetaZOLAMIDE (DIAMOX) 250 MG tablet Take 1 tablet (250 mg total) by mouth daily. 30 tablet 2  . allopurinol (ZYLOPRIM) 300 MG tablet TAKE 1 TABLET BY MOUTH EVERY DAY 30 tablet 2  . aspirin 81 MG tablet Take 81 mg by mouth at bedtime.     Marland Kitchen atorvastatin (LIPITOR) 80 MG tablet Take 1  tablet (80 mg total) by mouth at bedtime. 30 tablet 11  . atorvastatin (LIPITOR) 80 MG tablet TAKE 1 TABLET (80 MG TOTAL) BY MOUTH DAILY. 30 tablet 6  . clopidogrel (PLAVIX) 75 MG tablet Take 1 tablet (75 mg total) by mouth daily. Please Restart this medication on Tuesday 01/12/16. 30 tablet 10  . KLOR-CON M10 10 MEQ tablet     . losartan-hydrochlorothiazide (HYZAAR) 100-25 MG per tablet TAKE 1 TABLET BY MOUTH EVERY DAY 30 tablet 3  . methocarbamol (ROBAXIN) 500 MG tablet Take 500 mg by mouth every morning.     . mometasone-formoterol (DULERA) 200-5 MCG/ACT AERO Inhale 2 puffs into the lungs 2 (two) times daily. 1 Inhaler 6  . potassium chloride (K-DUR) 10 MEQ tablet Take 1 tablet (10 mEq total) by mouth daily. 30 tablet 11  . prochlorperazine (COMPAZINE) 10 MG tablet Take 1 tablet (10 mg total) by mouth every 6 (six) hours as needed for nausea or vomiting. 30 tablet 0  . sildenafil (VIAGRA) 100 MG tablet Take 1 tablet (100 mg total) by mouth as needed for erectile dysfunction. 1 tablet 4  . SYMBICORT 160-4.5 MCG/ACT inhaler INHALE 2 PUFFS INTO THE LUNGS 2 (TWO) TIMES DAILY.  2   No current facility-administered medications for this visit.    SURGICAL HISTORY:  Past Surgical History  Procedure Laterality Date  . Coronary stent placement      s/p  . Cardiac catheterization      '  06-stent placed, 6'13 - Dr. Lizbeth Bark follows  . Endobronchial ultrasound Bilateral 01/11/2016    Procedure: ENDOBRONCHIAL ULTRASOUND;  Surgeon: Collene Gobble, MD;  Location: [facility];  Service: Endoscopy;  Laterality: Bilateral;    REVIEW OF SYSTEMS:  A comprehensive review of systems was negative.   PHYSICAL EXAMINATION: General appearance: alert, cooperative, fatigued and no distress Head: Normocephalic, without obvious abnormality, atraumatic Neck: no adenopathy, no JVD, supple, symmetrical, trachea midline and thyroid not enlarged, symmetric, no tenderness/mass/nodules Lymph nodes: Cervical,  supraclavicular, and axillary nodes normal. Resp: clear to auscultation bilaterally Back: symmetric, no curvature. ROM normal. No CVA tenderness. Cardio: regular rate and rhythm, S1, S2 normal, no murmur, click, rub or gallop GI: soft, non-tender; bowel sounds normal; no masses,  no organomegaly Extremities: extremities normal, atraumatic, no cyanosis or edema  ECOG PERFORMANCE STATUS: 1 - Symptomatic but completely ambulatory  Blood pressure 109/43, pulse 99, temperature 98.4 F (36.9 C), temperature source Oral, resp. rate 19, height '6\' 1"'$  (1.854 m), weight 178 lb 12.8 oz (81.103 kg), SpO2 97 %.  LABORATORY DATA: Lab Results  Component Value Date   WBC 4.4 02/15/2016   HGB 11.5* 02/15/2016   HCT 35.9* 02/15/2016   MCV 99.2* 02/15/2016   PLT 322 02/15/2016      Chemistry      Component Value Date/Time   NA 140 02/15/2016 1057   NA 141 09/11/2015 0911   K 4.3 02/15/2016 1057   K 3.7 09/11/2015 0911   CL 95* 09/11/2015 0911   CO2 32* 02/15/2016 1057   CO2 42* 09/11/2015 0911   BUN 15.5 02/15/2016 1057   BUN 17 09/11/2015 0911   CREATININE 0.7 02/15/2016 1057   CREATININE 0.84 09/11/2015 0911      Component Value Date/Time   CALCIUM 8.7 02/15/2016 1057   CALCIUM 8.5 09/11/2015 0911   ALKPHOS 77 02/15/2016 1057   ALKPHOS 79 11/14/2014 1015   AST 25 02/15/2016 1057   AST 26 11/14/2014 1015   ALT 21 02/15/2016 1057   ALT 19 11/14/2014 1015   BILITOT 0.60 02/15/2016 1057   BILITOT 1.0 11/14/2014 1015       RADIOGRAPHIC STUDIES: Mr Jeri Cos Wo Contrast  Feb 05, 2016  CLINICAL DATA:  75 year old male with newly diagnosed non-small cell lung cancer. Staging. Subsequent encounter. EXAM: MRI HEAD WITHOUT AND WITH CONTRAST TECHNIQUE: Multiplanar, multiecho pulse sequences of the brain and surrounding structures were obtained without and with intravenous contrast. CONTRAST:  13m MULTIHANCE GADOBENATE DIMEGLUMINE 529 MG/ML IV SOLN COMPARISON:  Neck CT 07/24/2015. FINDINGS: Study  is mildly degraded by motion artifact despite repeated imaging attempts. No abnormal enhancement identified. Vascular pulsation artifact in the posterior fossa. No midline shift, mass effect, or evidence of intracranial mass lesion. No dural thickening identified. Cerebral volume is within normal limits for age. No restricted diffusion to suggest acute infarction. No ventriculomegaly, extra-axial collection or acute intracranial hemorrhage. Cervicomedullary junction and pituitary are within normal limits. Major intracranial vascular flow voids are within normal limits, dominant appearing distal left vertebral artery and fetal type bilateral PCA origins. Scattered and patchy periventricular and subcortical cerebral white matter T2 and FLAIR hyperintensity is nonspecific. Lesser patchy T2 hyperintensity in the pons. No cortical encephalomalacia. No chronic cerebral blood products identified. Visible internal auditory structures appear normal. Mastoids are clear. Mild paranasal sinus mucosal thickening. Negative orbit and scalp soft tissues. Grossly negative visualized cervical spine and spinal cord. No destructive osseous lesion identified. IMPRESSION: 1.  No acute or metastatic intracranial abnormality. 2.  Mild to moderate for age nonspecific signal changes in the brain, most commonly due to chronic small vessel disease. 3. Mild paranasal sinus mucosal thickening. Electronically Signed   By: Genevie Ann M.D.   On: 02/04/2016 20:11    ASSESSMENT AND PLAN: This is a very pleasant 75 years old African-American male with unresectable stage II a non-small cell lung cancer, squamous cell carcinoma and currently undergoing a course of concurrent chemoradiation with weekly carboplatin and paclitaxel. The patient is tolerating his treatment fairly well with no significant adverse effects. I recommended for him to proceed with cycle #3 today as a scheduled. The patient would come back for follow-up visit in 2 weeks for  reevaluation. He was advised to call immediately if he has any concerning symptoms in the interval. The patient voices understanding of current disease status and treatment options and is in agreement with the current care plan.  All questions were answered. The patient knows to call the clinic with any problems, questions or concerns. We can certainly see the patient much sooner if necessary.  Disclaimer: This note was dictated with voice recognition software. Similar sounding words can inadvertently be transcribed and may not be corrected upon review.

## 2016-02-17 ENCOUNTER — Telehealth: Payer: Self-pay | Admitting: *Deleted

## 2016-02-17 NOTE — Telephone Encounter (Signed)
I have called and spoke with the patient. I have moved his appts on 3/13 to earlier.

## 2016-02-18 ENCOUNTER — Other Ambulatory Visit: Payer: Self-pay | Admitting: Internal Medicine

## 2016-02-19 ENCOUNTER — Other Ambulatory Visit: Payer: Self-pay

## 2016-02-19 ENCOUNTER — Telehealth: Payer: Self-pay | Admitting: *Deleted

## 2016-02-19 DIAGNOSIS — N411 Chronic prostatitis: Secondary | ICD-10-CM

## 2016-02-19 DIAGNOSIS — C3491 Malignant neoplasm of unspecified part of right bronchus or lung: Secondary | ICD-10-CM

## 2016-02-19 MED ORDER — PROCHLORPERAZINE MALEATE 10 MG PO TABS: 10.0000 mg | ORAL_TABLET | Freq: Four times a day (QID) | ORAL | Status: DC | PRN

## 2016-02-19 NOTE — Telephone Encounter (Signed)
Patient called and was very difficult to understand.  He stated that he needs "more stomach medication so he won't get sick".  Patient had no idea what the name of the medication was that he was looking for however when asked if it's for nausea and vomiting patient's stated "yes".    A refill for compazine was sent to patient's pharmacy.

## 2016-02-19 NOTE — Telephone Encounter (Signed)
Oncology Nurse Navigator Documentation  Oncology Nurse Navigator Flowsheets 02/19/2016  Navigator Encounter Type Telephone  Telephone Outgoing Call/I called Ryan Contreras to check on him and how he was doing. He stated he was doing well.  I asked about his smoking cessation.  He stated he was still smoking but is trying to quit.  I asked him what I could do to help.  He said he could quit on his own.  I asked that he call me if he had any difficulty with quitting and we can go through some tips to help.  He stated he would call if needed.   Treatment Phase Treatment  Barriers/Navigation Needs Education  Education Smoking cessation  Acuity Level 1  Time Spent with Patient 15

## 2016-02-22 ENCOUNTER — Ambulatory Visit (HOSPITAL_BASED_OUTPATIENT_CLINIC_OR_DEPARTMENT_OTHER): Payer: BC Managed Care – PPO

## 2016-02-22 ENCOUNTER — Other Ambulatory Visit (HOSPITAL_BASED_OUTPATIENT_CLINIC_OR_DEPARTMENT_OTHER): Payer: BC Managed Care – PPO

## 2016-02-22 VITALS — BP 113/54 | HR 102 | Temp 98.6°F | Resp 16

## 2016-02-22 DIAGNOSIS — Z5111 Encounter for antineoplastic chemotherapy: Secondary | ICD-10-CM | POA: Diagnosis not present

## 2016-02-22 DIAGNOSIS — C3411 Malignant neoplasm of upper lobe, right bronchus or lung: Secondary | ICD-10-CM

## 2016-02-22 DIAGNOSIS — C3491 Malignant neoplasm of unspecified part of right bronchus or lung: Secondary | ICD-10-CM

## 2016-02-22 LAB — CBC WITH DIFFERENTIAL/PLATELET
BASO%: 0.8 % (ref 0.0–2.0)
BASOS ABS: 0.1 10*3/uL (ref 0.0–0.1)
EOS ABS: 0 10*3/uL (ref 0.0–0.5)
EOS%: 0.4 % (ref 0.0–7.0)
HEMATOCRIT: 34.6 % — AB (ref 38.4–49.9)
HEMOGLOBIN: 11.1 g/dL — AB (ref 13.0–17.1)
LYMPH#: 1.1 10*3/uL (ref 0.9–3.3)
LYMPH%: 12.1 % — ABNORMAL LOW (ref 14.0–49.0)
MCH: 32.2 pg (ref 27.2–33.4)
MCHC: 32.1 g/dL (ref 32.0–36.0)
MCV: 100.3 fL — AB (ref 79.3–98.0)
MONO#: 1 10*3/uL — ABNORMAL HIGH (ref 0.1–0.9)
MONO%: 11.3 % (ref 0.0–14.0)
NEUT#: 6.9 10*3/uL — ABNORMAL HIGH (ref 1.5–6.5)
NEUT%: 75.4 % — AB (ref 39.0–75.0)
PLATELETS: 238 10*3/uL (ref 140–400)
RBC: 3.45 10*6/uL — ABNORMAL LOW (ref 4.20–5.82)
RDW: 16 % — ABNORMAL HIGH (ref 11.0–14.6)
WBC: 9.1 10*3/uL (ref 4.0–10.3)
nRBC: 0 % (ref 0–0)

## 2016-02-22 LAB — COMPREHENSIVE METABOLIC PANEL
ALBUMIN: 3.2 g/dL — AB (ref 3.5–5.0)
ALK PHOS: 80 U/L (ref 40–150)
ALT: 26 U/L (ref 0–55)
ANION GAP: 7 meq/L (ref 3–11)
AST: 25 U/L (ref 5–34)
BILIRUBIN TOTAL: 0.57 mg/dL (ref 0.20–1.20)
BUN: 12.2 mg/dL (ref 7.0–26.0)
CALCIUM: 8.3 mg/dL — AB (ref 8.4–10.4)
CO2: 33 mEq/L — ABNORMAL HIGH (ref 22–29)
Chloride: 99 mEq/L (ref 98–109)
Creatinine: 0.7 mg/dL (ref 0.7–1.3)
Glucose: 106 mg/dl (ref 70–140)
POTASSIUM: 4.4 meq/L (ref 3.5–5.1)
Sodium: 138 mEq/L (ref 136–145)
Total Protein: 6.5 g/dL (ref 6.4–8.3)

## 2016-02-22 MED ORDER — PALONOSETRON HCL INJECTION 0.25 MG/5ML
0.2500 mg | Freq: Once | INTRAVENOUS | Status: AC
Start: 2016-02-22 — End: 2016-02-22
  Administered 2016-02-22: 0.25 mg via INTRAVENOUS

## 2016-02-22 MED ORDER — SODIUM CHLORIDE 0.9 % IV SOLN
202.2000 mg | Freq: Once | INTRAVENOUS | Status: AC
  Administered 2016-02-22: 200 mg via INTRAVENOUS
  Filled 2016-02-22: qty 20

## 2016-02-22 MED ORDER — DEXTROSE 5 % IV SOLN
45.0000 mg/m2 | Freq: Once | INTRAVENOUS | Status: AC
  Administered 2016-02-22: 90 mg via INTRAVENOUS
  Filled 2016-02-22: qty 15

## 2016-02-22 MED ORDER — FAMOTIDINE IN NACL 20-0.9 MG/50ML-% IV SOLN
INTRAVENOUS | Status: AC
  Filled 2016-02-22: qty 50

## 2016-02-22 MED ORDER — PALONOSETRON HCL INJECTION 0.25 MG/5ML
INTRAVENOUS | Status: AC
  Filled 2016-02-22: qty 5

## 2016-02-22 MED ORDER — DIPHENHYDRAMINE HCL 50 MG/ML IJ SOLN
INTRAMUSCULAR | Status: AC
  Filled 2016-02-22: qty 1

## 2016-02-22 MED ORDER — SODIUM CHLORIDE 0.9 % IV SOLN
Freq: Once | INTRAVENOUS | Status: AC
  Administered 2016-02-22: 13:00:00 via INTRAVENOUS

## 2016-02-22 MED ORDER — DIPHENHYDRAMINE HCL 50 MG/ML IJ SOLN
50.0000 mg | Freq: Once | INTRAMUSCULAR | Status: AC
  Administered 2016-02-22: 50 mg via INTRAVENOUS

## 2016-02-22 MED ORDER — SODIUM CHLORIDE 0.9 % IV SOLN
20.0000 mg | Freq: Once | INTRAVENOUS | Status: AC
  Administered 2016-02-22: 20 mg via INTRAVENOUS
  Filled 2016-02-22: qty 2

## 2016-02-22 MED ORDER — FAMOTIDINE IN NACL 20-0.9 MG/50ML-% IV SOLN
20.0000 mg | Freq: Once | INTRAVENOUS | Status: AC
  Administered 2016-02-22: 20 mg via INTRAVENOUS

## 2016-02-22 NOTE — Patient Instructions (Signed)
[facility] Discharge Instructions for Patients Receiving Chemotherapy  Today you received the following chemotherapy agents Taxol/Carboplatin.  To help prevent nausea and vomiting after your treatment, we encourage you to take your nausea medication as directed.   If you develop nausea and vomiting that is not controlled by your nausea medication, call the clinic.   BELOW ARE SYMPTOMS THAT SHOULD BE REPORTED IMMEDIATELY:  *FEVER GREATER THAN 100.5 F  *CHILLS WITH OR WITHOUT FEVER  NAUSEA AND VOMITING THAT IS NOT CONTROLLED WITH YOUR NAUSEA MEDICATION  *UNUSUAL SHORTNESS OF BREATH  *UNUSUAL BRUISING OR BLEEDING  TENDERNESS IN MOUTH AND THROAT WITH OR WITHOUT PRESENCE OF ULCERS  *URINARY PROBLEMS  *BOWEL PROBLEMS  UNUSUAL RASH Items with * indicate a potential emergency and should be followed up as soon as possible.  Feel free to call the clinic you have any questions or concerns. The clinic phone number is [phone].  Please show the [place] at check-in to the Emergency Department and triage nurse.

## 2016-02-26 ENCOUNTER — Other Ambulatory Visit: Payer: Self-pay

## 2016-02-26 MED ORDER — LOSARTAN POTASSIUM-HCTZ 100-25 MG PO TABS: 1.0000 | ORAL_TABLET | Freq: Every day | ORAL | Status: DC

## 2016-02-29 ENCOUNTER — Encounter: Payer: Self-pay | Admitting: Oncology

## 2016-02-29 ENCOUNTER — Ambulatory Visit (HOSPITAL_BASED_OUTPATIENT_CLINIC_OR_DEPARTMENT_OTHER): Payer: BC Managed Care – PPO

## 2016-02-29 ENCOUNTER — Telehealth: Payer: Self-pay | Admitting: Internal Medicine

## 2016-02-29 ENCOUNTER — Ambulatory Visit (HOSPITAL_BASED_OUTPATIENT_CLINIC_OR_DEPARTMENT_OTHER): Payer: BC Managed Care – PPO | Admitting: Oncology

## 2016-02-29 ENCOUNTER — Other Ambulatory Visit (HOSPITAL_BASED_OUTPATIENT_CLINIC_OR_DEPARTMENT_OTHER): Payer: BC Managed Care – PPO

## 2016-02-29 VITALS — BP 139/56 | HR 101 | Temp 98.0°F | Resp 16 | Ht 73.0 in | Wt 179.9 lb

## 2016-02-29 DIAGNOSIS — Z5111 Encounter for antineoplastic chemotherapy: Secondary | ICD-10-CM | POA: Diagnosis not present

## 2016-02-29 DIAGNOSIS — C3491 Malignant neoplasm of unspecified part of right bronchus or lung: Secondary | ICD-10-CM

## 2016-02-29 DIAGNOSIS — C3411 Malignant neoplasm of upper lobe, right bronchus or lung: Secondary | ICD-10-CM

## 2016-02-29 LAB — COMPREHENSIVE METABOLIC PANEL
ALBUMIN: 3.3 g/dL — AB (ref 3.5–5.0)
ALK PHOS: 81 U/L (ref 40–150)
ALT: 21 U/L (ref 0–55)
AST: 22 U/L (ref 5–34)
Anion Gap: 5 mEq/L (ref 3–11)
BILIRUBIN TOTAL: 0.72 mg/dL (ref 0.20–1.20)
BUN: 13.7 mg/dL (ref 7.0–26.0)
CALCIUM: 8.4 mg/dL (ref 8.4–10.4)
CO2: 35 mEq/L — ABNORMAL HIGH (ref 22–29)
CREATININE: 0.7 mg/dL (ref 0.7–1.3)
Chloride: 99 mEq/L (ref 98–109)
EGFR: >90 mL/min/{1.73_m2} (ref >90)
Glucose: 101 mg/dl (ref 70–140)
POTASSIUM: 4.4 meq/L (ref 3.5–5.1)
SODIUM: 139 meq/L (ref 136–145)
TOTAL PROTEIN: 6.8 g/dL (ref 6.4–8.3)

## 2016-02-29 LAB — CBC WITH DIFFERENTIAL/PLATELET
BASO%: 1.6 % (ref 0.0–2.0)
Basophils Absolute: 0.1 10*3/uL (ref 0.0–0.1)
EOS ABS: 0.1 10*3/uL (ref 0.0–0.5)
EOS%: 0.7 % (ref 0.0–7.0)
HCT: 33.9 % — ABNORMAL LOW (ref 38.4–49.9)
HEMOGLOBIN: 10.9 g/dL — AB (ref 13.0–17.1)
LYMPH%: 20 % (ref 14.0–49.0)
MCH: 32.5 pg (ref 27.2–33.4)
MCHC: 32.2 g/dL (ref 32.0–36.0)
MCV: 101.2 fL — ABNORMAL HIGH (ref 79.3–98.0)
MONO#: 1.1 10*3/uL — ABNORMAL HIGH (ref 0.1–0.9)
MONO%: 14 % (ref 0.0–14.0)
NEUT%: 63.7 % (ref 39.0–75.0)
NEUTROS ABS: 4.8 10*3/uL (ref 1.5–6.5)
Platelets: 185 10*3/uL (ref 140–400)
RBC: 3.35 10*6/uL — ABNORMAL LOW (ref 4.20–5.82)
RDW: 16.4 % — AB (ref 11.0–14.6)
WBC: 7.6 10*3/uL (ref 4.0–10.3)
lymph#: 1.5 10*3/uL (ref 0.9–3.3)

## 2016-02-29 MED ORDER — FAMOTIDINE IN NACL 20-0.9 MG/50ML-% IV SOLN
20.0000 mg | Freq: Once | INTRAVENOUS | Status: AC
  Administered 2016-02-29: 20 mg via INTRAVENOUS

## 2016-02-29 MED ORDER — SODIUM CHLORIDE 0.9 % IV SOLN
20.0000 mg | Freq: Once | INTRAVENOUS | Status: AC
  Administered 2016-02-29: 20 mg via INTRAVENOUS
  Filled 2016-02-29: qty 2

## 2016-02-29 MED ORDER — PALONOSETRON HCL INJECTION 0.25 MG/5ML
INTRAVENOUS | Status: AC
  Filled 2016-02-29: qty 5

## 2016-02-29 MED ORDER — SODIUM CHLORIDE 0.9 % IV SOLN
Freq: Once | INTRAVENOUS | Status: AC
  Administered 2016-02-29: 12:00:00 via INTRAVENOUS

## 2016-02-29 MED ORDER — DIPHENHYDRAMINE HCL 50 MG/ML IJ SOLN
50.0000 mg | Freq: Once | INTRAMUSCULAR | Status: AC
  Administered 2016-02-29: 50 mg via INTRAVENOUS

## 2016-02-29 MED ORDER — FAMOTIDINE IN NACL 20-0.9 MG/50ML-% IV SOLN
INTRAVENOUS | Status: AC
  Filled 2016-02-29: qty 50

## 2016-02-29 MED ORDER — DIPHENHYDRAMINE HCL 50 MG/ML IJ SOLN
INTRAMUSCULAR | Status: AC
  Filled 2016-02-29: qty 1

## 2016-02-29 MED ORDER — SODIUM CHLORIDE 0.9 % IV SOLN
202.2000 mg | Freq: Once | INTRAVENOUS | Status: AC
  Administered 2016-02-29: 200 mg via INTRAVENOUS
  Filled 2016-02-29: qty 20

## 2016-02-29 MED ORDER — PACLITAXEL CHEMO INJECTION 300 MG/50ML
45.0000 mg/m2 | Freq: Once | INTRAVENOUS | Status: AC
  Administered 2016-02-29: 90 mg via INTRAVENOUS
  Filled 2016-02-29: qty 15

## 2016-02-29 MED ORDER — PALONOSETRON HCL INJECTION 0.25 MG/5ML
0.2500 mg | Freq: Once | INTRAVENOUS | Status: AC
  Administered 2016-02-29: 0.25 mg via INTRAVENOUS

## 2016-02-29 NOTE — Telephone Encounter (Signed)
Scheduled patient appt per pof, avs report printed.  °

## 2016-02-29 NOTE — Patient Instructions (Signed)
[facility] Discharge Instructions for Patients Receiving Chemotherapy  Today you received the following chemotherapy agents Taxol/Carboplatin To help prevent nausea and vomiting after your treatment, we encourage you to take your nausea medication as prescribed.   If you develop nausea and vomiting that is not controlled by your nausea medication, call the clinic.   BELOW ARE SYMPTOMS THAT SHOULD BE REPORTED IMMEDIATELY:  *FEVER GREATER THAN 100.5 F  *CHILLS WITH OR WITHOUT FEVER  NAUSEA AND VOMITING THAT IS NOT CONTROLLED WITH YOUR NAUSEA MEDICATION  *UNUSUAL SHORTNESS OF BREATH  *UNUSUAL BRUISING OR BLEEDING  TENDERNESS IN MOUTH AND THROAT WITH OR WITHOUT PRESENCE OF ULCERS  *URINARY PROBLEMS  *BOWEL PROBLEMS  UNUSUAL RASH Items with * indicate a potential emergency and should be followed up as soon as possible.  Feel free to call the clinic you have any questions or concerns. The clinic phone number is [phone].  Please show the CHEMO ALERT CARD at check-in to the Emergency Department and triage nurse.   

## 2016-02-29 NOTE — Progress Notes (Signed)
[place] [phone]   Fax:([phone]  OFFICE PROGRESS NOTE  Renato Shin, MD [address] [place] [address]  DIAGNOSIS: stage IIA (T1b, N1, M0) non-small cell lung cancer, squamous cell carcinoma presented with right upper lobe lung nodule and hilar lymphadenopathy diagnosed in January 2017.  PRIOR THERAPY: None  CURRENT THERAPY: course of concurrent chemoradiation with weekly carboplatin for AUC of 2 and paclitaxel 45 MG/M2. Chemotherapy was started on 02/01/2016. Receiving radiation in [place]. Status post 4 cycles of chemotherapy.  INTERVAL HISTORY: Lumir Demetriou Zaragosa 75 y.o. male returns to the clinic today for follow-up visit accompanied by his sister. The patient is tolerating his course of concurrent chemoradiation fairly well. He denied having any significant fever or chills. He has no nausea or vomiting. He denied having any significant chest pain, shortness breath, or cough. Small amount of blood-tinged sputum noted, but no significant hemoptysis. He denied having any significant dysphagia or odynophagia. The patient is here today to start cycle #5 of his concurrent chemoradiation.  MEDICAL HISTORY: Past Medical History  Diagnosis Date  . Gout, unspecified     foot  . HYPERLIPIDEMIA   . HYPERTENSION   . PEPTIC ULCER DISEASE   . TOBACCO ABUSE   . Type 2 diabetes mellitus ([place])     denies on 01-06-16  . COPD (chronic obstructive pulmonary disease) ([place])     due to 50+ years Smoking.  . CAD, NATIVE VESSEL     coronary stent x1-tx. Plavix use.    ALLERGIES:  has No Known Allergies.  MEDICATIONS:  Current Outpatient Prescriptions  Medication Sig Dispense Refill  . acetaminophen (TYLENOL) 500 MG tablet Take 250 mg by mouth every 6 (six) hours as needed for moderate pain.    Marland Kitchen acetaZOLAMIDE (DIAMOX) 250 MG tablet Take 1 tablet (250 mg total) by mouth daily. 30 tablet 2  . allopurinol (ZYLOPRIM) 300 MG tablet TAKE 1  TABLET BY MOUTH EVERY DAY 30 tablet 2  . aspirin 81 MG tablet Take 81 mg by mouth at bedtime.     Marland Kitchen atorvastatin (LIPITOR) 80 MG tablet TAKE 1 TABLET (80 MG TOTAL) BY MOUTH DAILY. 30 tablet 6  . clopidogrel (PLAVIX) 75 MG tablet Take 1 tablet (75 mg total) by mouth daily. Please Restart this medication on Tuesday 01/12/16. 30 tablet 10  . KLOR-CON M10 10 MEQ tablet     . losartan-hydrochlorothiazide (HYZAAR) 100-25 MG tablet Take 1 tablet by mouth daily. 90 tablet 2  . methocarbamol (ROBAXIN) 500 MG tablet Take 500 mg by mouth every morning.     . mometasone-formoterol (DULERA) 200-5 MCG/ACT AERO Inhale 2 puffs into the lungs 2 (two) times daily. 1 Inhaler 6  . potassium chloride (K-DUR) 10 MEQ tablet Take 1 tablet (10 mEq total) by mouth daily. 30 tablet 11  . prochlorperazine (COMPAZINE) 10 MG tablet Take 1 tablet (10 mg total) by mouth every 6 (six) hours as needed for nausea or vomiting. 30 tablet 0  . sildenafil (VIAGRA) 100 MG tablet Take 1 tablet (100 mg total) by mouth as needed for erectile dysfunction. 1 tablet 4   No current facility-administered medications for this visit.    SURGICAL HISTORY:  Past Surgical History  Procedure Laterality Date  . Coronary stent placement      s/p  . Cardiac catheterization      '06-stent placed, 6'13 - Dr. Lizbeth Bark follows  . Endobronchial ultrasound Bilateral 01/11/2016  Procedure: ENDOBRONCHIAL ULTRASOUND;  Surgeon: Collene Gobble, MD;  Location: [facility];  Service: Endoscopy;  Laterality: Bilateral;    REVIEW OF SYSTEMS:  A comprehensive review of systems was negative.   PHYSICAL EXAMINATION: General appearance: alert, cooperative, fatigued and no distress Head: Normocephalic, without obvious abnormality, atraumatic Neck: no adenopathy, no JVD, supple, symmetrical, trachea midline and thyroid not enlarged, symmetric, no tenderness/mass/nodules Lymph nodes: Cervical, supraclavicular, and axillary nodes normal. Resp: clear to  auscultation bilaterally Back: symmetric, no curvature. ROM normal. No CVA tenderness. Cardio: regular rate and rhythm, S1, S2 normal, no murmur, click, rub or gallop GI: soft, non-tender; bowel sounds normal; no masses,  no organomegaly Extremities: extremities normal, atraumatic, no cyanosis or edema  ECOG PERFORMANCE STATUS: 1 - Symptomatic but completely ambulatory  Blood pressure 139/56, pulse 101, temperature 98 F (36.7 C), temperature source Oral, resp. rate 16, height '6\' 1"'$  (1.854 m), weight 179 lb 14.4 oz (81.602 kg), SpO2 93 %.  LABORATORY DATA: Lab Results  Component Value Date   WBC 7.6 02/29/2016   HGB 10.9* 02/29/2016   HCT 33.9* 02/29/2016   MCV 101.2* 02/29/2016   PLT 185 02/29/2016      Chemistry      Component Value Date/Time   NA 139 02/29/2016 1044   NA 141 09/11/2015 0911   K 4.4 02/29/2016 1044   K 3.7 09/11/2015 0911   CL 95* 09/11/2015 0911   CO2 35* 02/29/2016 1044   CO2 42* 09/11/2015 0911   BUN 13.7 02/29/2016 1044   BUN 17 09/11/2015 0911   CREATININE 0.7 02/29/2016 1044   CREATININE 0.84 09/11/2015 0911      Component Value Date/Time   CALCIUM 8.4 02/29/2016 1044   CALCIUM 8.5 09/11/2015 0911   ALKPHOS 81 02/29/2016 1044   ALKPHOS 79 11/14/2014 1015   AST 22 02/29/2016 1044   AST 26 11/14/2014 1015   ALT 21 02/29/2016 1044   ALT 19 11/14/2014 1015   BILITOT 0.72 02/29/2016 1044   BILITOT 1.0 11/14/2014 1015       RADIOGRAPHIC STUDIES: Mr Jeri Cos Wo Contrast  02-20-2016  CLINICAL DATA:  75 year old male with newly diagnosed non-small cell lung cancer. Staging. Subsequent encounter. EXAM: MRI HEAD WITHOUT AND WITH CONTRAST TECHNIQUE: Multiplanar, multiecho pulse sequences of the brain and surrounding structures were obtained without and with intravenous contrast. CONTRAST:  62m MULTIHANCE GADOBENATE DIMEGLUMINE 529 MG/ML IV SOLN COMPARISON:  Neck CT 07/24/2015. FINDINGS: Study is mildly degraded by motion artifact despite repeated  imaging attempts. No abnormal enhancement identified. Vascular pulsation artifact in the posterior fossa. No midline shift, mass effect, or evidence of intracranial mass lesion. No dural thickening identified. Cerebral volume is within normal limits for age. No restricted diffusion to suggest acute infarction. No ventriculomegaly, extra-axial collection or acute intracranial hemorrhage. Cervicomedullary junction and pituitary are within normal limits. Major intracranial vascular flow voids are within normal limits, dominant appearing distal left vertebral artery and fetal type bilateral PCA origins. Scattered and patchy periventricular and subcortical cerebral white matter T2 and FLAIR hyperintensity is nonspecific. Lesser patchy T2 hyperintensity in the pons. No cortical encephalomalacia. No chronic cerebral blood products identified. Visible internal auditory structures appear normal. Mastoids are clear. Mild paranasal sinus mucosal thickening. Negative orbit and scalp soft tissues. Grossly negative visualized cervical spine and spinal cord. No destructive osseous lesion identified. IMPRESSION: 1.  No acute or metastatic intracranial abnormality. 2. Mild to moderate for age nonspecific signal changes in the brain, most commonly due to chronic small  vessel disease. 3. Mild paranasal sinus mucosal thickening. Electronically Signed   By: Genevie Ann M.D.   On: 02/04/2016 20:11    ASSESSMENT AND PLAN: This is a very pleasant 75 year old African-American male with unresectable stage II a non-small cell lung cancer, squamous cell carcinoma and currently undergoing a course of concurrent chemoradiation with weekly carboplatin and paclitaxel. The patient is tolerating his treatment fairly well with no significant adverse effects.  The patient was seen and discussed with Dr. Julien Nordmann. Recommend for him to proceed with cycle #5 today as a scheduled. The patient will come back for follow-up visit in 2 weeks for  reevaluation. He was advised to call immediately if he has any concerning symptoms in the interval. The patient voices understanding of current disease status and treatment options and is in agreement with the current care plan.  All questions were answered. The patient knows to call the clinic with any problems, questions or concerns. We can certainly see the patient much sooner if necessary.  [name], DNP, AGPCNP-BC, AOCNP  ADDENDUM: Hematology/Oncology Attending: I had a face to face encounter with the patient. I recommended his care plan. This is a very pleasant 75 years old African-American male with unresectable a stage II a non-small cell lung cancer currently undergoing a course of concurrent chemoradiation with carboplatin and paclitaxel status post 4 cycles. The patient is tolerating his treatment fairly well with no significant adverse effects. I recommended for him to proceed with cycle #5 today as scheduled. He would come back for follow-up visit in 2 weeks for evaluation to the end of his treatment. He was advised to call immediately if he has any concerning symptoms in the interval.  Disclaimer: This note was dictated with voice recognition software. Similar sounding words can inadvertently be transcribed and may be missed upon review. Eilleen Kempf., MD 02/29/2016

## 2016-03-02 ENCOUNTER — Telehealth: Payer: Self-pay | Admitting: *Deleted

## 2016-03-02 NOTE — Telephone Encounter (Signed)
Patient called and left message at 10:49am.  Called patient back at 10:58am - NA Called patient back again at 11:15am and spoke with patient.  He had nausea and vomiting X1 last night - this is the first time he has had this with his chemotherapy.  Patient had chemo on 02/29/16 and had aloxi premed.  Patient is able to locate his compazine '10mg'$  tablets.  He states he did take one at 8am this morning.  He was able to eat cereal this am without further vomiting.  Let him know that we need to prevent the nausea and stressed that it is more importatnt to drink than eat.  We would like him to try and get in 64oz (#8 of 8oz glasses of non-caffeinated fluid- gingerale, gatorade etc) .Asked him to try and keep track of the amount of fluid he is able to take in.    He will try some gingerale and repeat his compazine at 2pm.  Instructed him to call us if he has nausea is not better by 3pm.  He feels like this is a workable plan for him

## 2016-03-02 NOTE — Telephone Encounter (Signed)
Return call received by this nurse.  Patient called reporting upset stomach asking Dr. Julien Nordmann to call something in.  With this call he reports vomited last night for first time since treatment.  Has abdominal pain that comes and goes and would like something for this.  Took compazine this morning.  Has eaten cereal this morning after taking nausea medicine.  No further emesis.  Return number [phone]."

## 2016-03-03 ENCOUNTER — Other Ambulatory Visit: Payer: Self-pay

## 2016-03-03 MED ORDER — POTASSIUM CHLORIDE ER 10 MEQ PO TBCR: 10.0000 meq | EXTENDED_RELEASE_TABLET | Freq: Every day | ORAL | Status: DC

## 2016-03-03 MED ORDER — LOSARTAN POTASSIUM-HCTZ 100-25 MG PO TABS: 1.0000 | ORAL_TABLET | Freq: Every day | ORAL | Status: DC

## 2016-03-07 ENCOUNTER — Other Ambulatory Visit (HOSPITAL_BASED_OUTPATIENT_CLINIC_OR_DEPARTMENT_OTHER): Payer: BC Managed Care – PPO

## 2016-03-07 ENCOUNTER — Ambulatory Visit (HOSPITAL_BASED_OUTPATIENT_CLINIC_OR_DEPARTMENT_OTHER): Payer: BC Managed Care – PPO

## 2016-03-07 VITALS — BP 158/77 | HR 101 | Temp 98.4°F | Resp 20

## 2016-03-07 DIAGNOSIS — C3411 Malignant neoplasm of upper lobe, right bronchus or lung: Secondary | ICD-10-CM

## 2016-03-07 DIAGNOSIS — Z5111 Encounter for antineoplastic chemotherapy: Secondary | ICD-10-CM | POA: Diagnosis not present

## 2016-03-07 DIAGNOSIS — C3491 Malignant neoplasm of unspecified part of right bronchus or lung: Secondary | ICD-10-CM

## 2016-03-07 LAB — CBC WITH DIFFERENTIAL/PLATELET
BASO%: 0.5 % (ref 0.0–2.0)
BASOS ABS: 0.1 10*3/uL (ref 0.0–0.1)
EOS ABS: 0 10*3/uL (ref 0.0–0.5)
EOS%: 0.3 % (ref 0.0–7.0)
HEMATOCRIT: 34.3 % — AB (ref 38.4–49.9)
HEMOGLOBIN: 11.2 g/dL — AB (ref 13.0–17.1)
LYMPH%: 5 % — ABNORMAL LOW (ref 14.0–49.0)
MCH: 33.1 pg (ref 27.2–33.4)
MCHC: 32.6 g/dL (ref 32.0–36.0)
MCV: 101.4 fL — AB (ref 79.3–98.0)
MONO#: 1.3 10*3/uL — ABNORMAL HIGH (ref 0.1–0.9)
MONO%: 11.4 % (ref 0.0–14.0)
NEUT#: 9.6 10*3/uL — ABNORMAL HIGH (ref 1.5–6.5)
NEUT%: 82.8 % — ABNORMAL HIGH (ref 39.0–75.0)
Platelets: 161 10*3/uL (ref 140–400)
RBC: 3.39 10*6/uL — ABNORMAL LOW (ref 4.20–5.82)
RDW: 17.9 % — AB (ref 11.0–14.6)
WBC: 11.6 10*3/uL — ABNORMAL HIGH (ref 4.0–10.3)
lymph#: 0.6 10*3/uL — ABNORMAL LOW (ref 0.9–3.3)

## 2016-03-07 LAB — COMPREHENSIVE METABOLIC PANEL
ALBUMIN: 3.5 g/dL (ref 3.5–5.0)
ALT: 19 U/L (ref 0–55)
AST: 23 U/L (ref 5–34)
Alkaline Phosphatase: 86 U/L (ref 40–150)
Anion Gap: 8 mEq/L (ref 3–11)
BILIRUBIN TOTAL: 1.45 mg/dL — AB (ref 0.20–1.20)
BUN: 10.5 mg/dL (ref 7.0–26.0)
CALCIUM: 8.9 mg/dL (ref 8.4–10.4)
CO2: 32 mEq/L — ABNORMAL HIGH (ref 22–29)
CREATININE: 0.7 mg/dL (ref 0.7–1.3)
Chloride: 86 mEq/L — ABNORMAL LOW (ref 98–109)
Glucose: 120 mg/dl (ref 70–140)
Potassium: 4.2 mEq/L (ref 3.5–5.1)
SODIUM: 126 meq/L — AB (ref 136–145)
Total Protein: 7.3 g/dL (ref 6.4–8.3)

## 2016-03-07 MED ORDER — FAMOTIDINE IN NACL 20-0.9 MG/50ML-% IV SOLN
20.0000 mg | Freq: Once | INTRAVENOUS | Status: AC
  Administered 2016-03-07: 20 mg via INTRAVENOUS

## 2016-03-07 MED ORDER — SODIUM CHLORIDE 0.9 % IV SOLN
Freq: Once | INTRAVENOUS | Status: AC
  Administered 2016-03-07: 13:00:00 via INTRAVENOUS

## 2016-03-07 MED ORDER — DIPHENHYDRAMINE HCL 50 MG/ML IJ SOLN
INTRAMUSCULAR | Status: AC
  Filled 2016-03-07: qty 1

## 2016-03-07 MED ORDER — PALONOSETRON HCL INJECTION 0.25 MG/5ML
INTRAVENOUS | Status: AC
  Filled 2016-03-07: qty 5

## 2016-03-07 MED ORDER — PALONOSETRON HCL INJECTION 0.25 MG/5ML
0.2500 mg | Freq: Once | INTRAVENOUS | Status: AC
  Administered 2016-03-07: 0.25 mg via INTRAVENOUS

## 2016-03-07 MED ORDER — SODIUM CHLORIDE 0.9 % IV SOLN
20.0000 mg | Freq: Once | INTRAVENOUS | Status: AC
  Administered 2016-03-07: 20 mg via INTRAVENOUS
  Filled 2016-03-07: qty 2

## 2016-03-07 MED ORDER — DIPHENHYDRAMINE HCL 50 MG/ML IJ SOLN
50.0000 mg | Freq: Once | INTRAMUSCULAR | Status: AC
  Administered 2016-03-07: 50 mg via INTRAVENOUS

## 2016-03-07 MED ORDER — SODIUM CHLORIDE 0.9 % IV SOLN
45.0000 mg/m2 | Freq: Once | INTRAVENOUS | Status: AC
  Administered 2016-03-07: 90 mg via INTRAVENOUS
  Filled 2016-03-07: qty 15

## 2016-03-07 MED ORDER — SODIUM CHLORIDE 0.9 % IV SOLN
202.2000 mg | Freq: Once | INTRAVENOUS | Status: AC
  Administered 2016-03-07: 200 mg via INTRAVENOUS
  Filled 2016-03-07: qty 20

## 2016-03-07 MED ORDER — FAMOTIDINE IN NACL 20-0.9 MG/50ML-% IV SOLN
INTRAVENOUS | Status: AC
  Filled 2016-03-07: qty 50

## 2016-03-07 NOTE — Patient Instructions (Addendum)
[facility] Discharge Instructions for Patients Receiving Chemotherapy  Today you received the following chemotherapy agents:  Taxol & carboplatin  To help prevent nausea and vomiting after your treatment, we encourage you to take your nausea medication.   If you develop nausea and vomiting that is not controlled by your nausea medication, call the clinic.   BELOW ARE SYMPTOMS THAT SHOULD BE REPORTED IMMEDIATELY:  *FEVER GREATER THAN 100.5 F  *CHILLS WITH OR WITHOUT FEVER  NAUSEA AND VOMITING THAT IS NOT CONTROLLED WITH YOUR NAUSEA MEDICATION  *UNUSUAL SHORTNESS OF BREATH  *UNUSUAL BRUISING OR BLEEDING  TENDERNESS IN MOUTH AND THROAT WITH OR WITHOUT PRESENCE OF ULCERS  *URINARY PROBLEMS  *BOWEL PROBLEMS  UNUSUAL RASH Items with * indicate a potential emergency and should be followed up as soon as possible.  Feel free to call the clinic you have any questions or concerns. The clinic phone number is [phone].  Please show the [facility] at check-in to the Emergency Department and triage nurse.  Please drink plenty of fluids & try to increase fiber with fruits & veggies & increase colace to twice daily & add laxative if no BM in 3 days.  Call if laxative doesn't work.

## 2016-03-07 NOTE — Progress Notes (Signed)
OK to treat despite pt c/o nausea/constipation & elevated bili.  OK to give extra fluids with chemo per Dr Julien Nordmann.  Orders repeated & verified.

## 2016-03-08 ENCOUNTER — Other Ambulatory Visit: Payer: Self-pay | Admitting: Internal Medicine

## 2016-03-14 ENCOUNTER — Encounter: Payer: Self-pay | Admitting: Internal Medicine

## 2016-03-14 ENCOUNTER — Ambulatory Visit (HOSPITAL_BASED_OUTPATIENT_CLINIC_OR_DEPARTMENT_OTHER): Payer: BC Managed Care – PPO

## 2016-03-14 ENCOUNTER — Other Ambulatory Visit (HOSPITAL_BASED_OUTPATIENT_CLINIC_OR_DEPARTMENT_OTHER): Payer: BC Managed Care – PPO

## 2016-03-14 ENCOUNTER — Telehealth: Payer: Self-pay | Admitting: Internal Medicine

## 2016-03-14 ENCOUNTER — Ambulatory Visit (HOSPITAL_BASED_OUTPATIENT_CLINIC_OR_DEPARTMENT_OTHER): Payer: BC Managed Care – PPO | Admitting: Internal Medicine

## 2016-03-14 VITALS — BP 122/64 | HR 109 | Temp 98.3°F | Resp 18 | Ht 73.0 in | Wt 169.5 lb

## 2016-03-14 DIAGNOSIS — K5900 Constipation, unspecified: Secondary | ICD-10-CM | POA: Diagnosis not present

## 2016-03-14 DIAGNOSIS — C3411 Malignant neoplasm of upper lobe, right bronchus or lung: Secondary | ICD-10-CM

## 2016-03-14 DIAGNOSIS — Z5111 Encounter for antineoplastic chemotherapy: Secondary | ICD-10-CM

## 2016-03-14 DIAGNOSIS — C3491 Malignant neoplasm of unspecified part of right bronchus or lung: Secondary | ICD-10-CM

## 2016-03-14 LAB — COMPREHENSIVE METABOLIC PANEL
ALBUMIN: 3.3 g/dL — AB (ref 3.5–5.0)
ALK PHOS: 73 U/L (ref 40–150)
ALT: 21 U/L (ref 0–55)
AST: 20 U/L (ref 5–34)
Anion Gap: 9 mEq/L (ref 3–11)
BUN: 11.9 mg/dL (ref 7.0–26.0)
CALCIUM: 9.1 mg/dL (ref 8.4–10.4)
CHLORIDE: 94 meq/L — AB (ref 98–109)
CO2: 33 mEq/L — ABNORMAL HIGH (ref 22–29)
Creatinine: 0.7 mg/dL (ref 0.7–1.3)
Glucose: 91 mg/dl (ref 70–140)
POTASSIUM: 4 meq/L (ref 3.5–5.1)
SODIUM: 135 meq/L — AB (ref 136–145)
Total Bilirubin: 0.84 mg/dL (ref 0.20–1.20)
Total Protein: 7 g/dL (ref 6.4–8.3)

## 2016-03-14 LAB — CBC WITH DIFFERENTIAL/PLATELET
BASO%: 0.4 % (ref 0.0–2.0)
BASOS ABS: 0 10*3/uL (ref 0.0–0.1)
EOS ABS: 0 10*3/uL (ref 0.0–0.5)
EOS%: 0.4 % (ref 0.0–7.0)
HEMATOCRIT: 31.6 % — AB (ref 38.4–49.9)
HEMOGLOBIN: 10.4 g/dL — AB (ref 13.0–17.1)
LYMPH%: 9.5 % — ABNORMAL LOW (ref 14.0–49.0)
MCH: 34.4 pg — AB (ref 27.2–33.4)
MCHC: 33 g/dL (ref 32.0–36.0)
MCV: 104.3 fL — AB (ref 79.3–98.0)
MONO#: 1 10*3/uL — ABNORMAL HIGH (ref 0.1–0.9)
MONO%: 9.3 % (ref 0.0–14.0)
NEUT#: 8.8 10*3/uL — ABNORMAL HIGH (ref 1.5–6.5)
NEUT%: 80.4 % — AB (ref 39.0–75.0)
Platelets: 253 10*3/uL (ref 140–400)
RBC: 3.03 10*6/uL — ABNORMAL LOW (ref 4.20–5.82)
RDW: 19.1 % — AB (ref 11.0–14.6)
WBC: 10.9 10*3/uL — ABNORMAL HIGH (ref 4.0–10.3)
lymph#: 1 10*3/uL (ref 0.9–3.3)

## 2016-03-14 MED ORDER — DIPHENHYDRAMINE HCL 50 MG/ML IJ SOLN
INTRAMUSCULAR | Status: AC
  Filled 2016-03-14: qty 1

## 2016-03-14 MED ORDER — CARBOPLATIN CHEMO INJECTION 450 MG/45ML
202.2000 mg | Freq: Once | INTRAVENOUS | Status: AC
  Administered 2016-03-14: 200 mg via INTRAVENOUS
  Filled 2016-03-14: qty 20

## 2016-03-14 MED ORDER — PALONOSETRON HCL INJECTION 0.25 MG/5ML
0.2500 mg | Freq: Once | INTRAVENOUS | Status: AC
  Administered 2016-03-14: 0.25 mg via INTRAVENOUS

## 2016-03-14 MED ORDER — SODIUM CHLORIDE 0.9 % IV SOLN
20.0000 mg | Freq: Once | INTRAVENOUS | Status: AC
  Administered 2016-03-14: 20 mg via INTRAVENOUS
  Filled 2016-03-14: qty 2

## 2016-03-14 MED ORDER — SODIUM CHLORIDE 0.9 % IV SOLN
45.0000 mg/m2 | Freq: Once | INTRAVENOUS | Status: AC
  Administered 2016-03-14: 90 mg via INTRAVENOUS
  Filled 2016-03-14: qty 15

## 2016-03-14 MED ORDER — FAMOTIDINE IN NACL 20-0.9 MG/50ML-% IV SOLN
20.0000 mg | Freq: Once | INTRAVENOUS | Status: AC
  Administered 2016-03-14: 20 mg via INTRAVENOUS

## 2016-03-14 MED ORDER — DIPHENHYDRAMINE HCL 50 MG/ML IJ SOLN
50.0000 mg | Freq: Once | INTRAMUSCULAR | Status: AC
  Administered 2016-03-14: 50 mg via INTRAVENOUS

## 2016-03-14 MED ORDER — SODIUM CHLORIDE 0.9 % IV SOLN
Freq: Once | INTRAVENOUS | Status: AC
  Administered 2016-03-14: 12:00:00 via INTRAVENOUS

## 2016-03-14 MED ORDER — PALONOSETRON HCL INJECTION 0.25 MG/5ML
INTRAVENOUS | Status: AC
  Filled 2016-03-14: qty 5

## 2016-03-14 MED ORDER — FAMOTIDINE IN NACL 20-0.9 MG/50ML-% IV SOLN
INTRAVENOUS | Status: AC
  Filled 2016-03-14: qty 50

## 2016-03-14 NOTE — Telephone Encounter (Signed)
Gave and printed appt shced and avs for pt for may

## 2016-03-14 NOTE — Progress Notes (Signed)
[facility] Telephone:(336) [phone]   [phone]) [phone]  OFFICE PROGRESS NOTE  Renato Shin, MD [address] [place] [address]  DIAGNOSIS: Unresectable Stage IIA (T1b, N1, M0) non-small cell lung cancer, squamous cell carcinoma presented with right upper lobe lung nodule and hilar lymphadenopathy diagnosed in January 2017.  PRIOR THERAPY: None  CURRENT THERAPY: course of concurrent chemoradiation with weekly carboplatin for AUC of 2 and paclitaxel 45 MG/M2. Status post 6 cycles.  INTERVAL HISTORY: Ryan Contreras 75 y.o. male returns to the clinic today for follow-up visit accompanied by his sister. The patient is tolerating his course of concurrent chemoradiation fairly well. He denied having any significant fever or chills. He has no nausea or vomiting but has some mild abdominal pain and constipation. He denied having any significant chest pain, shortness of breath, cough or hemoptysis. He denied having any significant dysphagia or odynophagia. The patient is here today to start cycle #7 of his concurrent chemoradiation. He sees his radiotherapy and aspirate.  MEDICAL HISTORY: Past Medical History  Diagnosis Date  . Gout, unspecified     foot  . HYPERLIPIDEMIA   . HYPERTENSION   . PEPTIC ULCER DISEASE   . TOBACCO ABUSE   . Type 2 diabetes mellitus ([place])     denies on 01-06-16  . COPD (chronic obstructive pulmonary disease) ([place])     due to 50+ years Smoking.  . CAD, NATIVE VESSEL     coronary stent x1-tx. Plavix use.    ALLERGIES:  has No Known Allergies.  MEDICATIONS:  Current Outpatient Prescriptions  Medication Sig Dispense Refill  . acetaminophen (TYLENOL) 500 MG tablet Take 250 mg by mouth every 6 (six) hours as needed for moderate pain.    Marland Kitchen acetaZOLAMIDE (DIAMOX) 250 MG tablet Take 1 tablet (250 mg total) by mouth daily. 30 tablet 2  . allopurinol (ZYLOPRIM) 300 MG tablet TAKE 1 TABLET BY MOUTH EVERY DAY 30 tablet 2  .  aspirin 81 MG tablet Take 81 mg by mouth at bedtime.     Marland Kitchen atorvastatin (LIPITOR) 80 MG tablet TAKE 1 TABLET (80 MG TOTAL) BY MOUTH DAILY. 30 tablet 6  . clopidogrel (PLAVIX) 75 MG tablet Take 1 tablet (75 mg total) by mouth daily. Please Restart this medication on Tuesday 01/12/16. 30 tablet 10  . KLOR-CON M10 10 MEQ tablet     . losartan-hydrochlorothiazide (HYZAAR) 100-25 MG tablet Take 1 tablet by mouth daily. 90 tablet 2  . methocarbamol (ROBAXIN) 500 MG tablet Take 500 mg by mouth every morning.     . mometasone-formoterol (DULERA) 200-5 MCG/ACT AERO Inhale 2 puffs into the lungs 2 (two) times daily. 1 Inhaler 6  . potassium chloride (K-DUR) 10 MEQ tablet Take 1 tablet (10 mEq total) by mouth daily. 90 tablet 2  . prochlorperazine (COMPAZINE) 10 MG tablet TAKE 1 TABLET (10 MG TOTAL) BY MOUTH EVERY 6 (SIX) HOURS AS NEEDED FOR NAUSEA OR VOMITING. 30 tablet 0  . sildenafil (VIAGRA) 100 MG tablet Take 1 tablet (100 mg total) by mouth as needed for erectile dysfunction. 1 tablet 4  . dexamethasone (DECADRON) 2 MG tablet Take 2 mg by mouth 2 (two) times daily.  1  . ranitidine (ZANTAC) 150 MG tablet Take 150 mg by mouth daily.  1   No current facility-administered medications for this visit.    SURGICAL HISTORY:  Past Surgical History  Procedure Laterality Date  . Coronary stent placement  s/p  . Cardiac catheterization      '06-stent placed, 6'13 - Dr. Lizbeth Bark follows  . Endobronchial ultrasound Bilateral 01/11/2016    Procedure: ENDOBRONCHIAL ULTRASOUND;  Surgeon: Collene Gobble, MD;  Location: [facility];  Service: Endoscopy;  Laterality: Bilateral;    REVIEW OF SYSTEMS:  A comprehensive review of systems was negative except for: Gastrointestinal: positive for abdominal pain and constipation   PHYSICAL EXAMINATION: General appearance: alert, cooperative, fatigued and no distress Head: Normocephalic, without obvious abnormality, atraumatic Neck: no adenopathy, no JVD, supple,  symmetrical, trachea midline and thyroid not enlarged, symmetric, no tenderness/mass/nodules Lymph nodes: Cervical, supraclavicular, and axillary nodes normal. Resp: clear to auscultation bilaterally Back: symmetric, no curvature. ROM normal. No CVA tenderness. Cardio: regular rate and rhythm, S1, S2 normal, no murmur, click, rub or gallop GI: soft, non-tender; bowel sounds normal; no masses,  no organomegaly Extremities: extremities normal, atraumatic, no cyanosis or edema  ECOG PERFORMANCE STATUS: 1 - Symptomatic but completely ambulatory  Blood pressure 122/64, pulse 109, temperature 98.3 F (36.8 C), temperature source Oral, resp. rate 18, height '6\' 1"'$  (1.854 m), weight 169 lb 8 oz (76.885 kg), SpO2 97 %.  LABORATORY DATA: Lab Results  Component Value Date   WBC 10.9* 03/14/2016   HGB 10.4* 03/14/2016   HCT 31.6* 03/14/2016   MCV 104.3* 03/14/2016   PLT 253 03/14/2016      Chemistry      Component Value Date/Time   NA 126* 03/07/2016 1159   NA 141 09/11/2015 0911   K 4.2 03/07/2016 1159   K 3.7 09/11/2015 0911   CL 95* 09/11/2015 0911   CO2 32* 03/07/2016 1159   CO2 42* 09/11/2015 0911   BUN 10.5 03/07/2016 1159   BUN 17 09/11/2015 0911   CREATININE 0.7 03/07/2016 1159   CREATININE 0.84 09/11/2015 0911      Component Value Date/Time   CALCIUM 8.9 03/07/2016 1159   CALCIUM 8.5 09/11/2015 0911   ALKPHOS 86 03/07/2016 1159   ALKPHOS 79 11/14/2014 1015   AST 23 03/07/2016 1159   AST 26 11/14/2014 1015   ALT 19 03/07/2016 1159   ALT 19 11/14/2014 1015   BILITOT 1.45* 03/07/2016 1159   BILITOT 1.0 11/14/2014 1015       RADIOGRAPHIC STUDIES: No results found.  ASSESSMENT AND PLAN: This is a very pleasant 75 years old African-American male with unresectable stage II a non-small cell lung cancer, squamous cell carcinoma and currently undergoing a course of concurrent chemoradiation with weekly carboplatin and paclitaxel.Status post 6 cycles. The patient is  tolerating his treatment fairly well with no significant adverse effects. I recommended for him to proceed with cycle #7 today as a scheduled. The patient would come back for follow-up visit in 6 weeks for reevaluation after repeating CT scan of the chest for restaging of his disease. For constipation and abdominal pain, I advised the patient to use MiraLAX and gas-x He was advised to call immediately if he has any concerning symptoms in the interval. The patient voices understanding of current disease status and treatment options and is in agreement with the current care plan.  All questions were answered. The patient knows to call the clinic with any problems, questions or concerns. We can certainly see the patient much sooner if necessary.  Disclaimer: This note was dictated with voice recognition software. Similar sounding words can inadvertently be transcribed and may not be corrected upon review.

## 2016-03-14 NOTE — Patient Instructions (Signed)
[facility] Discharge Instructions for Patients Receiving Chemotherapy  Today you received the following chemotherapy agents:  Taxol & carboplatin  To help prevent nausea and vomiting after your treatment, we encourage you to take your nausea medication.   If you develop nausea and vomiting that is not controlled by your nausea medication, call the clinic.   BELOW ARE SYMPTOMS THAT SHOULD BE REPORTED IMMEDIATELY:  *FEVER GREATER THAN 100.5 F  *CHILLS WITH OR WITHOUT FEVER  NAUSEA AND VOMITING THAT IS NOT CONTROLLED WITH YOUR NAUSEA MEDICATION  *UNUSUAL SHORTNESS OF BREATH  *UNUSUAL BRUISING OR BLEEDING  TENDERNESS IN MOUTH AND THROAT WITH OR WITHOUT PRESENCE OF ULCERS  *URINARY PROBLEMS  *BOWEL PROBLEMS  UNUSUAL RASH Items with * indicate a potential emergency and should be followed up as soon as possible.  Feel free to call the clinic you have any questions or concerns. The clinic phone number is [phone].  Please show the [facility] at check-in to the Emergency Department and triage nurse.  Please drink plenty of fluids & try to increase fiber with fruits & veggies & increase colace to twice daily & add laxative if no BM in 3 days.  Call if laxative doesn't work.

## 2016-03-21 ENCOUNTER — Ambulatory Visit: Payer: Medicare Other

## 2016-03-21 ENCOUNTER — Other Ambulatory Visit: Payer: Self-pay | Admitting: Internal Medicine

## 2016-03-22 ENCOUNTER — Telehealth: Payer: Self-pay

## 2016-03-22 NOTE — Telephone Encounter (Signed)
Pt called asking if he has a nausea prescription yet. It was e-scribed yesterday. Pt was asking about constipation. He has not had BM in 5 days. He described a bottle of liquid he has drank about half of to help him with constipation. Told him to drink the rest of it. He has a bicolor pill that is supposed to be a softener, he is taking 2/day, he did not know the name of it. Told him to take the 2 bottles with him to [facility] when he picks up his compazine and the pharmacist can help him. Told him to call if no BM in another day.

## 2016-03-23 DIAGNOSIS — K921 Melena: Secondary | ICD-10-CM | POA: Diagnosis not present

## 2016-03-23 DIAGNOSIS — K5900 Constipation, unspecified: Secondary | ICD-10-CM | POA: Diagnosis present

## 2016-03-23 DIAGNOSIS — R571 Hypovolemic shock: Secondary | ICD-10-CM | POA: Diagnosis not present

## 2016-03-23 DIAGNOSIS — D62 Acute posthemorrhagic anemia: Secondary | ICD-10-CM | POA: Diagnosis not present

## 2016-03-23 DIAGNOSIS — K3189 Other diseases of stomach and duodenum: Secondary | ICD-10-CM | POA: Diagnosis present

## 2016-03-23 DIAGNOSIS — B9681 Helicobacter pylori [H. pylori] as the cause of diseases classified elsewhere: Secondary | ICD-10-CM | POA: Diagnosis present

## 2016-03-23 DIAGNOSIS — Z9981 Dependence on supplemental oxygen: Secondary | ICD-10-CM | POA: Diagnosis not present

## 2016-03-23 DIAGNOSIS — Z7902 Long term (current) use of antithrombotics/antiplatelets: Secondary | ICD-10-CM | POA: Diagnosis not present

## 2016-03-23 DIAGNOSIS — K2210 Ulcer of esophagus without bleeding: Secondary | ICD-10-CM | POA: Diagnosis not present

## 2016-03-23 DIAGNOSIS — N400 Enlarged prostate without lower urinary tract symptoms: Secondary | ICD-10-CM | POA: Diagnosis present

## 2016-03-23 DIAGNOSIS — F17210 Nicotine dependence, cigarettes, uncomplicated: Secondary | ICD-10-CM | POA: Diagnosis present

## 2016-03-23 DIAGNOSIS — M109 Gout, unspecified: Secondary | ICD-10-CM | POA: Diagnosis present

## 2016-03-23 DIAGNOSIS — K255 Chronic or unspecified gastric ulcer with perforation: Secondary | ICD-10-CM | POA: Diagnosis not present

## 2016-03-23 DIAGNOSIS — E119 Type 2 diabetes mellitus without complications: Secondary | ICD-10-CM | POA: Diagnosis present

## 2016-03-23 DIAGNOSIS — C3490 Malignant neoplasm of unspecified part of unspecified bronchus or lung: Secondary | ICD-10-CM | POA: Diagnosis present

## 2016-03-23 DIAGNOSIS — I119 Hypertensive heart disease without heart failure: Secondary | ICD-10-CM | POA: Diagnosis present

## 2016-03-23 DIAGNOSIS — Z7982 Long term (current) use of aspirin: Secondary | ICD-10-CM | POA: Diagnosis not present

## 2016-03-23 DIAGNOSIS — J189 Pneumonia, unspecified organism: Secondary | ICD-10-CM | POA: Diagnosis present

## 2016-03-23 DIAGNOSIS — Z86010 Personal history of colonic polyps: Secondary | ICD-10-CM | POA: Diagnosis not present

## 2016-03-23 DIAGNOSIS — K315 Obstruction of duodenum: Secondary | ICD-10-CM | POA: Diagnosis not present

## 2016-03-23 DIAGNOSIS — K209 Esophagitis, unspecified: Secondary | ICD-10-CM | POA: Diagnosis present

## 2016-03-23 DIAGNOSIS — I2510 Atherosclerotic heart disease of native coronary artery without angina pectoris: Secondary | ICD-10-CM | POA: Diagnosis present

## 2016-03-23 DIAGNOSIS — J9621 Acute and chronic respiratory failure with hypoxia: Secondary | ICD-10-CM | POA: Diagnosis not present

## 2016-03-23 DIAGNOSIS — R000 Tachycardia, unspecified: Secondary | ICD-10-CM | POA: Diagnosis not present

## 2016-03-23 DIAGNOSIS — K208 Other esophagitis: Secondary | ICD-10-CM | POA: Diagnosis not present

## 2016-03-23 DIAGNOSIS — E785 Hyperlipidemia, unspecified: Secondary | ICD-10-CM | POA: Diagnosis present

## 2016-03-23 DIAGNOSIS — J440 Chronic obstructive pulmonary disease with acute lower respiratory infection: Secondary | ICD-10-CM | POA: Diagnosis present

## 2016-03-23 DIAGNOSIS — J9611 Chronic respiratory failure with hypoxia: Secondary | ICD-10-CM | POA: Diagnosis present

## 2016-03-23 DIAGNOSIS — K2981 Duodenitis with bleeding: Secondary | ICD-10-CM | POA: Diagnosis present

## 2016-03-23 DIAGNOSIS — R531 Weakness: Secondary | ICD-10-CM | POA: Diagnosis not present

## 2016-03-23 DIAGNOSIS — J449 Chronic obstructive pulmonary disease, unspecified: Secondary | ICD-10-CM | POA: Diagnosis not present

## 2016-03-23 DIAGNOSIS — K266 Chronic or unspecified duodenal ulcer with both hemorrhage and perforation: Secondary | ICD-10-CM | POA: Diagnosis present

## 2016-03-23 DIAGNOSIS — K259 Gastric ulcer, unspecified as acute or chronic, without hemorrhage or perforation: Secondary | ICD-10-CM | POA: Diagnosis not present

## 2016-03-23 DIAGNOSIS — K269 Duodenal ulcer, unspecified as acute or chronic, without hemorrhage or perforation: Secondary | ICD-10-CM | POA: Diagnosis not present

## 2016-03-24 ENCOUNTER — Telehealth: Payer: Self-pay | Admitting: Medical Oncology

## 2016-03-24 NOTE — Telephone Encounter (Signed)
Mohamed notified.

## 2016-04-04 ENCOUNTER — Other Ambulatory Visit: Payer: Self-pay | Admitting: Internal Medicine

## 2016-04-04 DIAGNOSIS — I6523 Occlusion and stenosis of bilateral carotid arteries: Secondary | ICD-10-CM

## 2016-04-11 ENCOUNTER — Telehealth: Payer: Self-pay | Admitting: Endocrinology

## 2016-04-11 ENCOUNTER — Inpatient Hospital Stay (HOSPITAL_COMMUNITY): Admission: RE | Admit: 2016-04-11 | Payer: Medicare Other | Source: Ambulatory Visit

## 2016-04-11 DIAGNOSIS — E119 Type 2 diabetes mellitus without complications: Secondary | ICD-10-CM | POA: Diagnosis not present

## 2016-04-11 DIAGNOSIS — Z807 Family history of other malignant neoplasms of lymphoid, hematopoietic and related tissues: Secondary | ICD-10-CM | POA: Diagnosis not present

## 2016-04-11 DIAGNOSIS — I10 Essential (primary) hypertension: Secondary | ICD-10-CM | POA: Diagnosis not present

## 2016-04-11 DIAGNOSIS — Z803 Family history of malignant neoplasm of breast: Secondary | ICD-10-CM | POA: Diagnosis not present

## 2016-04-11 DIAGNOSIS — J449 Chronic obstructive pulmonary disease, unspecified: Secondary | ICD-10-CM | POA: Diagnosis not present

## 2016-04-11 DIAGNOSIS — Z6822 Body mass index (BMI) 22.0-22.9, adult: Secondary | ICD-10-CM | POA: Diagnosis not present

## 2016-04-11 DIAGNOSIS — C3490 Malignant neoplasm of unspecified part of unspecified bronchus or lung: Secondary | ICD-10-CM | POA: Diagnosis not present

## 2016-04-11 DIAGNOSIS — F17210 Nicotine dependence, cigarettes, uncomplicated: Secondary | ICD-10-CM | POA: Diagnosis not present

## 2016-04-11 DIAGNOSIS — J9621 Acute and chronic respiratory failure with hypoxia: Secondary | ICD-10-CM | POA: Diagnosis not present

## 2016-04-11 DIAGNOSIS — I2510 Atherosclerotic heart disease of native coronary artery without angina pectoris: Secondary | ICD-10-CM | POA: Diagnosis not present

## 2016-04-11 DIAGNOSIS — K279 Peptic ulcer, site unspecified, unspecified as acute or chronic, without hemorrhage or perforation: Secondary | ICD-10-CM

## 2016-04-11 DIAGNOSIS — Z7982 Long term (current) use of aspirin: Secondary | ICD-10-CM | POA: Diagnosis not present

## 2016-04-11 DIAGNOSIS — M109 Gout, unspecified: Secondary | ICD-10-CM | POA: Diagnosis not present

## 2016-04-11 DIAGNOSIS — Z955 Presence of coronary angioplasty implant and graft: Secondary | ICD-10-CM | POA: Diagnosis not present

## 2016-04-11 NOTE — Telephone Encounter (Signed)
This message needs to be triaged. Are you having any symptoms now?

## 2016-04-11 NOTE — Telephone Encounter (Signed)
Patient is ask to be referred to GI doctors for bleeding ulcer, please advise  [phone]

## 2016-04-11 NOTE — Telephone Encounter (Signed)
done

## 2016-04-11 NOTE — Telephone Encounter (Signed)
Spoke with Real Cons he stated he was in hospital for a week for a bleeding Ulcer and he wants to be referred to a local GI doctor for follow up, pt has no symptoms right now.PT stated he has a doctor in [place] but that is to far for him and he wants one in [place]. Please advise

## 2016-04-12 NOTE — Telephone Encounter (Signed)
I contacted the pt and advised we have placed the referral. Pt was advised to call back if he has not heard back from GI within a couple of days.

## 2016-04-13 ENCOUNTER — Telehealth: Payer: Self-pay | Admitting: *Deleted

## 2016-04-13 ENCOUNTER — Telehealth: Payer: Self-pay | Admitting: Medical Oncology

## 2016-04-13 ENCOUNTER — Encounter: Payer: Self-pay | Admitting: Medical Oncology

## 2016-04-13 NOTE — Telephone Encounter (Signed)
FMLA/ Return to work form placed in [employer]. Pt would like form to be faxed to his work by tomorrow.

## 2016-04-13 NOTE — Telephone Encounter (Signed)
Voicemail from patient requesting return "call from collaborative nurse and papers needing to be filled out.  Return number [phone] OR [phone]."

## 2016-04-13 NOTE — Telephone Encounter (Signed)
I gave pt my fax number and he stated he will fax form for [facility] to complete.

## 2016-04-13 NOTE — Progress Notes (Signed)
Does not need letter at this time just a from completed that he will fax to me.

## 2016-04-14 ENCOUNTER — Encounter: Payer: Self-pay | Admitting: Internal Medicine

## 2016-04-14 NOTE — Progress Notes (Signed)
left in pod

## 2016-04-15 ENCOUNTER — Encounter: Payer: Self-pay | Admitting: Internal Medicine

## 2016-04-15 ENCOUNTER — Telehealth: Payer: Self-pay

## 2016-04-15 ENCOUNTER — Other Ambulatory Visit: Payer: Self-pay | Admitting: Internal Medicine

## 2016-04-15 NOTE — Telephone Encounter (Signed)
Patient calling to check up on FMLA paperwork.  Patient also unaware of 8 am appt to have his corotid arteries studied.  Patient is aware of his 10 lab appt in the [facility].  Writer provided him with Raquel's direct number and reinforced his appt at 8 am.  Patient seemed a bit disoriented and it was very difficult to understand and communicate with him.

## 2016-04-15 NOTE — Progress Notes (Signed)
left in pod- left for dr to sign

## 2016-04-18 ENCOUNTER — Other Ambulatory Visit (HOSPITAL_BASED_OUTPATIENT_CLINIC_OR_DEPARTMENT_OTHER): Payer: BC Managed Care – PPO

## 2016-04-18 ENCOUNTER — Ambulatory Visit (HOSPITAL_COMMUNITY)
Admission: RE | Admit: 2016-04-18 | Discharge: 2016-04-18 | Disposition: A | Discharge status: Home / Self Care | Payer: BC Managed Care – PPO | Source: Ambulatory Visit | Attending: Cardiology | Admitting: Cardiology

## 2016-04-18 ENCOUNTER — Telehealth: Payer: Self-pay

## 2016-04-18 ENCOUNTER — Encounter (HOSPITAL_COMMUNITY): Payer: Medicare Other

## 2016-04-18 DIAGNOSIS — C3491 Malignant neoplasm of unspecified part of right bronchus or lung: Secondary | ICD-10-CM

## 2016-04-18 DIAGNOSIS — C3411 Malignant neoplasm of upper lobe, right bronchus or lung: Secondary | ICD-10-CM | POA: Diagnosis not present

## 2016-04-18 DIAGNOSIS — E119 Type 2 diabetes mellitus without complications: Secondary | ICD-10-CM | POA: Insufficient documentation

## 2016-04-18 DIAGNOSIS — Z720 Tobacco use: Secondary | ICD-10-CM | POA: Insufficient documentation

## 2016-04-18 DIAGNOSIS — Z5111 Encounter for antineoplastic chemotherapy: Secondary | ICD-10-CM

## 2016-04-18 DIAGNOSIS — E785 Hyperlipidemia, unspecified: Secondary | ICD-10-CM | POA: Diagnosis not present

## 2016-04-18 DIAGNOSIS — I6523 Occlusion and stenosis of bilateral carotid arteries: Secondary | ICD-10-CM | POA: Insufficient documentation

## 2016-04-18 DIAGNOSIS — I2510 Atherosclerotic heart disease of native coronary artery without angina pectoris: Secondary | ICD-10-CM | POA: Diagnosis not present

## 2016-04-18 DIAGNOSIS — I10 Essential (primary) hypertension: Secondary | ICD-10-CM | POA: Diagnosis not present

## 2016-04-18 LAB — CBC WITH DIFFERENTIAL/PLATELET
BASO%: 0.8 % (ref 0.0–2.0)
Basophils Absolute: 0.1 10*3/uL (ref 0.0–0.1)
EOS%: 0.5 % (ref 0.0–7.0)
Eosinophils Absolute: 0.1 10*3/uL (ref 0.0–0.5)
HCT: 27.9 % — ABNORMAL LOW (ref 38.4–49.9)
HGB: 9 g/dL — ABNORMAL LOW (ref 13.0–17.1)
LYMPH%: 7.1 % — AB (ref 14.0–49.0)
MCH: 32.2 pg (ref 27.2–33.4)
MCHC: 32.4 g/dL (ref 32.0–36.0)
MCV: 99.4 fL — AB (ref 79.3–98.0)
MONO#: 2 10*3/uL — ABNORMAL HIGH (ref 0.1–0.9)
MONO%: 12.7 % (ref 0.0–14.0)
NEUT%: 78.9 % — AB (ref 39.0–75.0)
NEUTROS ABS: 12.6 10*3/uL — AB (ref 1.5–6.5)
Platelets: 369 10*3/uL (ref 140–400)
RBC: 2.81 10*6/uL — AB (ref 4.20–5.82)
RDW: 24.1 % — ABNORMAL HIGH (ref 11.0–14.6)
WBC: 16 10*3/uL — AB (ref 4.0–10.3)
lymph#: 1.1 10*3/uL (ref 0.9–3.3)

## 2016-04-18 LAB — COMPREHENSIVE METABOLIC PANEL
ALT: 20 U/L (ref 0–55)
AST: 16 U/L (ref 5–34)
Albumin: 3 g/dL — ABNORMAL LOW (ref 3.5–5.0)
Alkaline Phosphatase: 78 U/L (ref 40–150)
Anion Gap: 11 mEq/L (ref 3–11)
BUN: 16.1 mg/dL (ref 7.0–26.0)
CHLORIDE: 97 meq/L — AB (ref 98–109)
CO2: 29 meq/L (ref 22–29)
Calcium: 9.5 mg/dL (ref 8.4–10.4)
Creatinine: 0.8 mg/dL (ref 0.7–1.3)
GLUCOSE: 97 mg/dL (ref 70–140)
Potassium: 3.7 mEq/L (ref 3.5–5.1)
SODIUM: 136 meq/L (ref 136–145)
TOTAL PROTEIN: 7.4 g/dL (ref 6.4–8.3)
Total Bilirubin: 0.74 mg/dL (ref 0.20–1.20)

## 2016-04-18 NOTE — Telephone Encounter (Signed)
Patient called stating that he was out in his car in front of the CC.  He states that he got a call telling him that his "paperwork" was ready to be picked up.  Writer spoke with MD's nurse.  The paperwork had been dropped off on Friday 04/15/16- MD was not here that day.  Paperwork still needs to be signed.  Writer let patient know that RN would call him when it was ready.  Patient requesting that paperwork be faxed and copies mailed to him

## 2016-04-20 ENCOUNTER — Ambulatory Visit: Payer: Medicare Other | Admitting: Endocrinology

## 2016-04-20 ENCOUNTER — Encounter: Payer: Self-pay | Admitting: Internal Medicine

## 2016-04-20 DIAGNOSIS — Z0289 Encounter for other administrative examinations: Secondary | ICD-10-CM

## 2016-04-20 NOTE — Telephone Encounter (Signed)
"  I'm calling to check on my records,  Have they been sent yet?"  Return number [phone] or [phone]."

## 2016-04-20 NOTE — Progress Notes (Signed)
Faxed fmla form to [place] [phone] and let patient know it was done and his copy at front for pick up on 04/25/16

## 2016-04-20 NOTE — Progress Notes (Signed)
I spoke with patient and he gave [phone] and [phone] for his employer. I left mary ingle a mess on vmail to fax me his fmla/disab forms to me( they were misplaced) and we would get them faxed to them today after dr Mckinley Jewel signs them. I called back and Juanita took mess also with my info to fax again. She said she was close to [facility] and would make sure she got it. I let nurse-Diane know I spoke with employer. I told patient once I go I would call him and let him know and then make sure copy was mailed to him for his recrds as well as fax back to employer.

## 2016-04-22 ENCOUNTER — Telehealth: Payer: Self-pay | Admitting: Internal Medicine

## 2016-04-22 ENCOUNTER — Encounter (HOSPITAL_COMMUNITY): Payer: Medicare Other

## 2016-04-22 NOTE — Telephone Encounter (Signed)
s.w. pt and advised confirmed appt

## 2016-04-25 ENCOUNTER — Telehealth: Payer: Self-pay | Admitting: Nurse Practitioner

## 2016-04-25 ENCOUNTER — Ambulatory Visit (HOSPITAL_BASED_OUTPATIENT_CLINIC_OR_DEPARTMENT_OTHER): Payer: BC Managed Care – PPO | Admitting: Internal Medicine

## 2016-04-25 ENCOUNTER — Telehealth: Payer: Self-pay | Admitting: Internal Medicine

## 2016-04-25 ENCOUNTER — Encounter: Payer: Self-pay | Admitting: Internal Medicine

## 2016-04-25 VITALS — BP 129/51 | HR 111 | Temp 98.9°F | Resp 18 | Ht 73.0 in | Wt 165.4 lb

## 2016-04-25 DIAGNOSIS — C3491 Malignant neoplasm of unspecified part of right bronchus or lung: Secondary | ICD-10-CM | POA: Diagnosis not present

## 2016-04-25 NOTE — Progress Notes (Signed)
[facility] [phone]   [phone]  OFFICE PROGRESS NOTE  Ryan Shin, MD [address] [place] [address]  DIAGNOSIS: Unresectable Stage IIA (T1b, N1, M0) non-small cell lung cancer, squamous cell carcinoma presented with right upper lobe lung nodule and hilar lymphadenopathy diagnosed in January 2017.  PRIOR THERAPY: Course of concurrent chemoradiation with weekly carboplatin for AUC of 2 and paclitaxel 45 MG/M2. Status post 7 cycles, last dose was given 03/14/2016 with partial response.  CURRENT THERAPY: None.  INTERVAL HISTORY: Ryan Contreras 75 y.o. male returns to the clinic today for follow-up visit accompanied by his sister. The patient tolerated the previous course of concurrent chemoradiation fairly well. He was admitted to [facility] last week with GI bleed likely secondary to his treatment with Plavix and aspirin. He will receive 2 units of PRBCs transfusion during his admission. He denied having any significant fever or chills. He has no nausea or vomiting but has some mild abdominal pain and constipation. He denied having any significant chest pain, shortness of breath, cough or hemoptysis. He denied having any significant dysphagia or odynophagia. The patient was supposed to have repeat CT scan of the chest on 04/22/2016 but he missed his appointment.  MEDICAL HISTORY: Past Medical History  Diagnosis Date  . Gout, unspecified     foot  . HYPERLIPIDEMIA   . HYPERTENSION   . PEPTIC ULCER DISEASE   . TOBACCO ABUSE   . Type 2 diabetes mellitus ([place])     denies on 01-06-16  . COPD (chronic obstructive pulmonary disease) ([place])     due to 50+ years Smoking.  . CAD, NATIVE VESSEL     coronary stent x1-tx. Plavix use.    ALLERGIES:  has No Known Allergies.  MEDICATIONS:  Current Outpatient Prescriptions  Medication Sig Dispense Refill  . acetaminophen (TYLENOL) 500 MG tablet Take 250 mg by mouth  every 6 (six) hours as needed for moderate pain.    Marland Kitchen acetaZOLAMIDE (DIAMOX) 250 MG tablet Take 1 tablet (250 mg total) by mouth daily. 30 tablet 2  . allopurinol (ZYLOPRIM) 300 MG tablet TAKE 1 TABLET BY MOUTH EVERY DAY 30 tablet 2  . aspirin 81 MG tablet Take 81 mg by mouth at bedtime.     Marland Kitchen atorvastatin (LIPITOR) 80 MG tablet TAKE 1 TABLET (80 MG TOTAL) BY MOUTH DAILY. 30 tablet 6  . clopidogrel (PLAVIX) 75 MG tablet Take 1 tablet (75 mg total) by mouth daily. Please Restart this medication on Tuesday 01/12/16. 30 tablet 10  . dexamethasone (DECADRON) 2 MG tablet Take 2 mg by mouth 2 (two) times daily.  1  . KLOR-CON M10 10 MEQ tablet     . losartan-hydrochlorothiazide (HYZAAR) 100-25 MG tablet Take 1 tablet by mouth daily. 90 tablet 2  . methocarbamol (ROBAXIN) 500 MG tablet Take 500 mg by mouth every morning.     . mometasone-formoterol (DULERA) 200-5 MCG/ACT AERO Inhale 2 puffs into the lungs 2 (two) times daily. 1 Inhaler 6  . pantoprazole (PROTONIX) 40 MG tablet Take 40 mg by mouth.    . potassium chloride (K-DUR) 10 MEQ tablet Take 1 tablet (10 mEq total) by mouth daily. 90 tablet 2  . prochlorperazine (COMPAZINE) 10 MG tablet TAKE 1 TABLET (10 MG TOTAL) BY MOUTH EVERY 6 (SIX) HOURS AS NEEDED FOR NAUSEA OR VOMITING. 30 tablet 0  . prochlorperazine (COMPAZINE) 10 MG tablet TAKE 1 TABLET (10 MG TOTAL) BY  MOUTH EVERY 6 (SIX) HOURS AS NEEDED FOR NAUSEA OR VOMITING. 30 tablet 0  . ranitidine (ZANTAC) 150 MG tablet Take 150 mg by mouth daily.  1  . senna-docusate (SENOKOT-S) 8.6-50 MG tablet Take by mouth.    . sildenafil (VIAGRA) 100 MG tablet Take 1 tablet (100 mg total) by mouth as needed for erectile dysfunction. 1 tablet 4   No current facility-administered medications for this visit.    SURGICAL HISTORY:  Past Surgical History  Procedure Laterality Date  . Coronary stent placement      s/p  . Cardiac catheterization      '06-stent placed, 6'13 - Dr. Lizbeth Bark follows  .  Endobronchial ultrasound Bilateral 01/11/2016    Procedure: ENDOBRONCHIAL ULTRASOUND;  Surgeon: Collene Gobble, MD;  Location: [facility];  Service: Endoscopy;  Laterality: Bilateral;    REVIEW OF SYSTEMS:  A comprehensive review of systems was negative except for: Constitutional: positive for fatigue Gastrointestinal: positive for dyspepsia   PHYSICAL EXAMINATION: General appearance: alert, cooperative, fatigued and no distress Head: Normocephalic, without obvious abnormality, atraumatic Neck: no adenopathy, no JVD, supple, symmetrical, trachea midline and thyroid not enlarged, symmetric, no tenderness/mass/nodules Lymph nodes: Cervical, supraclavicular, and axillary nodes normal. Resp: clear to auscultation bilaterally Back: symmetric, no curvature. ROM normal. No CVA tenderness. Cardio: regular rate and rhythm, S1, S2 normal, no murmur, click, rub or gallop GI: soft, non-tender; bowel sounds normal; no masses,  no organomegaly Extremities: extremities normal, atraumatic, no cyanosis or edema  ECOG PERFORMANCE STATUS: 1 - Symptomatic but completely ambulatory  Blood pressure 129/51, pulse 111, temperature 98.9 F (37.2 C), temperature source Oral, resp. rate 18, height '6\' 1"'$  (1.854 m), weight 165 lb 6.4 oz (75.025 kg), SpO2 99 %.  LABORATORY DATA: Lab Results  Component Value Date   WBC 16.0* 04/18/2016   HGB 9.0* 04/18/2016   HCT 27.9* 04/18/2016   MCV 99.4* 04/18/2016   PLT 369 04/18/2016      Chemistry      Component Value Date/Time   NA 136 04/18/2016 1028   NA 141 09/11/2015 0911   K 3.7 04/18/2016 1028   K 3.7 09/11/2015 0911   CL 95* 09/11/2015 0911   CO2 29 04/18/2016 1028   CO2 42* 09/11/2015 0911   BUN 16.1 04/18/2016 1028   BUN 17 09/11/2015 0911   CREATININE 0.8 04/18/2016 1028   CREATININE 0.84 09/11/2015 0911      Component Value Date/Time   CALCIUM 9.5 04/18/2016 1028   CALCIUM 8.5 09/11/2015 0911   ALKPHOS 78 04/18/2016 1028   ALKPHOS 79  11/14/2014 1015   AST 16 04/18/2016 1028   AST 26 11/14/2014 1015   ALT 20 04/18/2016 1028   ALT 19 11/14/2014 1015   BILITOT 0.74 04/18/2016 1028   BILITOT 1.0 11/14/2014 1015       RADIOGRAPHIC STUDIES: No results found.  ASSESSMENT AND PLAN: This is a very pleasant 75 years old African-American male with unresectable stage II a non-small cell lung cancer, squamous cell carcinoma status post a course of concurrent chemoradiation with weekly carboplatin and paclitaxel.Status post 7 cycles. The patient tolerated his treatment fairly well with no significant adverse effects. He missed his appointment for the restaging scan. I recommended for the patient to have repeat CT scan of the chest performed this week. I would see him back for follow-up visit in one week for evaluation and discussion of his scan results. His recent history of GI bleed, I advised the patient to discuss  with his cardiologist the need to continue on Plavix and aspirin. He was advised to call immediately if he has any concerning symptoms in the interval. The patient voices understanding of current disease status and treatment options and is in agreement with the current care plan.  All questions were answered. The patient knows to call the clinic with any problems, questions or concerns. We can certainly see the patient much sooner if necessary.  Disclaimer: This note was dictated with voice recognition software. Similar sounding words can inadvertently be transcribed and may not be corrected upon review.

## 2016-04-25 NOTE — Telephone Encounter (Signed)
Gave pt apt & avs °

## 2016-04-27 ENCOUNTER — Ambulatory Visit (HOSPITAL_COMMUNITY)
Admission: RE | Admit: 2016-04-27 | Discharge: 2016-04-27 | Disposition: A | Discharge status: Home / Self Care | Payer: BC Managed Care – PPO | Source: Ambulatory Visit | Attending: Internal Medicine | Admitting: Internal Medicine

## 2016-04-27 DIAGNOSIS — Z5111 Encounter for antineoplastic chemotherapy: Secondary | ICD-10-CM

## 2016-04-27 DIAGNOSIS — R222 Localized swelling, mass and lump, trunk: Secondary | ICD-10-CM | POA: Diagnosis not present

## 2016-04-27 DIAGNOSIS — C3491 Malignant neoplasm of unspecified part of right bronchus or lung: Secondary | ICD-10-CM | POA: Diagnosis present

## 2016-04-27 DIAGNOSIS — Z9221 Personal history of antineoplastic chemotherapy: Secondary | ICD-10-CM | POA: Insufficient documentation

## 2016-04-27 DIAGNOSIS — R911 Solitary pulmonary nodule: Secondary | ICD-10-CM | POA: Diagnosis not present

## 2016-04-27 DIAGNOSIS — D3502 Benign neoplasm of left adrenal gland: Secondary | ICD-10-CM | POA: Diagnosis not present

## 2016-04-27 MED ORDER — IOPAMIDOL (ISOVUE-300) INJECTION 61%
75.0000 mL | Freq: Once | INTRAVENOUS | Status: AC | PRN
  Administered 2016-04-27: 75 mL via INTRAVENOUS

## 2016-04-29 ENCOUNTER — Ambulatory Visit (HOSPITAL_BASED_OUTPATIENT_CLINIC_OR_DEPARTMENT_OTHER): Payer: BC Managed Care – PPO | Admitting: Internal Medicine

## 2016-04-29 ENCOUNTER — Encounter: Payer: Self-pay | Admitting: Internal Medicine

## 2016-04-29 ENCOUNTER — Telehealth: Payer: Self-pay | Admitting: Internal Medicine

## 2016-04-29 ENCOUNTER — Ambulatory Visit: Payer: Medicare Other | Admitting: Nurse Practitioner

## 2016-04-29 ENCOUNTER — Other Ambulatory Visit: Payer: Self-pay | Admitting: Medical Oncology

## 2016-04-29 VITALS — BP 143/63 | HR 112 | Temp 98.3°F | Resp 19 | Ht 73.0 in | Wt 161.9 lb

## 2016-04-29 DIAGNOSIS — Z5111 Encounter for antineoplastic chemotherapy: Secondary | ICD-10-CM

## 2016-04-29 DIAGNOSIS — C3411 Malignant neoplasm of upper lobe, right bronchus or lung: Secondary | ICD-10-CM

## 2016-04-29 DIAGNOSIS — K922 Gastrointestinal hemorrhage, unspecified: Secondary | ICD-10-CM | POA: Insufficient documentation

## 2016-04-29 DIAGNOSIS — C3491 Malignant neoplasm of unspecified part of right bronchus or lung: Secondary | ICD-10-CM

## 2016-04-29 HISTORY — DX: Gastrointestinal hemorrhage, unspecified: K92.2

## 2016-04-29 NOTE — Telephone Encounter (Signed)
Gave and prnted appt shced and avs for pt for Aug

## 2016-04-29 NOTE — Progress Notes (Signed)
[place] [phone]   [phone]  OFFICE PROGRESS NOTE  Renato Shin, MD [address] [place] [address]  DIAGNOSIS: Unresectable Stage IIA (T1b, N1, M0) non-small cell lung cancer, squamous cell carcinoma presented with right upper lobe lung nodule and hilar lymphadenopathy diagnosed in January 2017.  PRIOR THERAPY: Course of concurrent chemoradiation with weekly carboplatin for AUC of 2 and paclitaxel 45 MG/M2. Status post 7 cycles, last dose was given 03/14/2016 with partial response.  CURRENT THERAPY: Observation.  INTERVAL HISTORY: Ryan Contreras 75 y.o. male returns to the clinic today for follow-up visit accompanied by his sister and niece. The patient is feeling fine today was no specific complaints except for the epigastric pain. He has a history of upper gastrointestinal bleed and had EGD at [facility] less than 2 weeks ago. He started on Decadron 2 mg by mouth twice a day and I am not sure the reason for this treatment. I advised the patient to discontinue it. He denied having any significant fever or chills. He has no nausea or vomiting but has some mild abdominal pain and constipation. He denied having any significant chest pain, shortness of breath, cough or hemoptysis. He denied having any significant dysphagia or odynophagia. He had repeat CT scan of the chest performed recently and he is here for evaluation and discussion of his scan results.  MEDICAL HISTORY: Past Medical History  Diagnosis Date  . Gout, unspecified     foot  . HYPERLIPIDEMIA   . HYPERTENSION   . PEPTIC ULCER DISEASE   . TOBACCO ABUSE   . Type 2 diabetes mellitus ([place])     denies on 01-06-16  . COPD (chronic obstructive pulmonary disease) ([place])     due to 50+ years Smoking.  . CAD, NATIVE VESSEL     coronary stent x1-tx. Plavix use.    ALLERGIES:  has No Known Allergies.  MEDICATIONS:  Current Outpatient Prescriptions    Medication Sig Dispense Refill  . acetaminophen (TYLENOL) 500 MG tablet Take 250 mg by mouth every 6 (six) hours as needed for moderate pain.    Marland Kitchen acetaZOLAMIDE (DIAMOX) 250 MG tablet Take 1 tablet (250 mg total) by mouth daily. 30 tablet 2  . allopurinol (ZYLOPRIM) 300 MG tablet TAKE 1 TABLET BY MOUTH EVERY DAY 30 tablet 2  . aspirin 81 MG tablet Take 81 mg by mouth at bedtime.     Marland Kitchen atorvastatin (LIPITOR) 80 MG tablet TAKE 1 TABLET (80 MG TOTAL) BY MOUTH DAILY. 30 tablet 6  . clopidogrel (PLAVIX) 75 MG tablet Take 1 tablet (75 mg total) by mouth daily. Please Restart this medication on Tuesday 01/12/16. 30 tablet 10  . dexamethasone (DECADRON) 2 MG tablet Take 2 mg by mouth 2 (two) times daily.  1  . KLOR-CON M10 10 MEQ tablet     . losartan-hydrochlorothiazide (HYZAAR) 100-25 MG tablet Take 1 tablet by mouth daily. 90 tablet 2  . methocarbamol (ROBAXIN) 500 MG tablet Take 500 mg by mouth every morning.     . mometasone-formoterol (DULERA) 200-5 MCG/ACT AERO Inhale 2 puffs into the lungs 2 (two) times daily. 1 Inhaler 6  . pantoprazole (PROTONIX) 40 MG tablet Take 40 mg by mouth.    . potassium chloride (K-DUR) 10 MEQ tablet Take 1 tablet (10 mEq total) by mouth daily. 90 tablet 2  . prochlorperazine (COMPAZINE) 10 MG tablet TAKE 1 TABLET (10 MG TOTAL) BY MOUTH EVERY  6 (SIX) HOURS AS NEEDED FOR NAUSEA OR VOMITING. 30 tablet 0  . prochlorperazine (COMPAZINE) 10 MG tablet TAKE 1 TABLET (10 MG TOTAL) BY MOUTH EVERY 6 (SIX) HOURS AS NEEDED FOR NAUSEA OR VOMITING. 30 tablet 0  . ranitidine (ZANTAC) 150 MG tablet Take 150 mg by mouth daily.  1  . senna-docusate (SENOKOT-S) 8.6-50 MG tablet Take by mouth.    . sildenafil (VIAGRA) 100 MG tablet Take 1 tablet (100 mg total) by mouth as needed for erectile dysfunction. 1 tablet 4   No current facility-administered medications for this visit.    SURGICAL HISTORY:  Past Surgical History  Procedure Laterality Date  . Coronary stent placement       s/p  . Cardiac catheterization      '06-stent placed, 6'13 - Dr. Lizbeth Bark follows  . Endobronchial ultrasound Bilateral 01/11/2016    Procedure: ENDOBRONCHIAL ULTRASOUND;  Surgeon: Collene Gobble, MD;  Location: [facility];  Service: Endoscopy;  Laterality: Bilateral;    REVIEW OF SYSTEMS:  Constitutional: positive for fatigue Eyes: negative Ears, nose, mouth, throat, and face: negative Respiratory: positive for dyspnea on exertion Cardiovascular: negative Gastrointestinal: positive for reflux symptoms Genitourinary:negative Integument/breast: negative Hematologic/lymphatic: negative Musculoskeletal:negative Neurological: negative Behavioral/Psych: negative Endocrine: negative Allergic/Immunologic: negative   PHYSICAL EXAMINATION: General appearance: alert, cooperative, fatigued and no distress Head: Normocephalic, without obvious abnormality, atraumatic Neck: no adenopathy, no JVD, supple, symmetrical, trachea midline and thyroid not enlarged, symmetric, no tenderness/mass/nodules Lymph nodes: Cervical, supraclavicular, and axillary nodes normal. Resp: clear to auscultation bilaterally Back: symmetric, no curvature. ROM normal. No CVA tenderness. Cardio: regular rate and rhythm, S1, S2 normal, no murmur, click, rub or gallop GI: soft, non-tender; bowel sounds normal; no masses,  no organomegaly Extremities: extremities normal, atraumatic, no cyanosis or edema Neurologic: Alert and oriented X 3, normal strength and tone. Normal symmetric reflexes. Normal coordination and gait  ECOG PERFORMANCE STATUS: 1 - Symptomatic but completely ambulatory  Blood pressure 143/63, pulse 112, temperature 98.3 F (36.8 C), temperature source Oral, resp. rate 19, height '6\' 1"'$  (1.854 m), weight 161 lb 14.4 oz (73.437 kg), SpO2 98 %.  LABORATORY DATA: Lab Results  Component Value Date   WBC 16.0* 04/18/2016   HGB 9.0* 04/18/2016   HCT 27.9* 04/18/2016   MCV 99.4* 04/18/2016   PLT 369  04/18/2016      Chemistry      Component Value Date/Time   NA 136 04/18/2016 1028   NA 141 09/11/2015 0911   K 3.7 04/18/2016 1028   K 3.7 09/11/2015 0911   CL 95* 09/11/2015 0911   CO2 29 04/18/2016 1028   CO2 42* 09/11/2015 0911   BUN 16.1 04/18/2016 1028   BUN 17 09/11/2015 0911   CREATININE 0.8 04/18/2016 1028   CREATININE 0.84 09/11/2015 0911      Component Value Date/Time   CALCIUM 9.5 04/18/2016 1028   CALCIUM 8.5 09/11/2015 0911   ALKPHOS 78 04/18/2016 1028   ALKPHOS 79 11/14/2014 1015   AST 16 04/18/2016 1028   AST 26 11/14/2014 1015   ALT 20 04/18/2016 1028   ALT 19 11/14/2014 1015   BILITOT 0.74 04/18/2016 1028   BILITOT 1.0 11/14/2014 1015       RADIOGRAPHIC STUDIES: Ct Chest W Contrast  04/27/2016  CLINICAL DATA:  Non-small cell lung cancer.  Chemotherapy complete. EXAM: CT CHEST WITH CONTRAST TECHNIQUE: Multidetector CT imaging of the chest was performed during intravenous contrast administration. CONTRAST:  50m ISOVUE-300 IOPAMIDOL (ISOVUE-300) INJECTION 61% COMPARISON:  PET-CT 11/14/2015 FINDINGS: Mediastinum/Nodes: No axillary supraclavicular lymphadenopathy. No mediastinal hilar adenopathy. No pericardial fluid. Coronary calcifications present. Esophagus normal Lungs/Pleura: Irregular mass within the central RIGHT upper lobe is decreased in volume measuring 21 by 11 mm (image 64, series 5) decreased from 27 x 20 mm. There is a focus of peripheral reticular nodule are branching in the lateral RIGHT upper lobe which is new from prior and likely relates radiation therapy. Centrilobular emphysema in the upper lobes. Within the LEFT lower lobe central 9 mm ill-defined nodule is unchanged from 11 mm. Upper abdomen: 8 mm nodule of the LEFT adrenal gland is unchanged. On comparison exam this nodularity has low attenuation consistent benign adenoma. RIGHT adrenal glands normal. There is a low-attenuation lesion posterior the RIGHT hepatic lobe which was not hypermetabolic  on comparison PET-CT scan. This lesion measures 4 cm and is unchanged. this could potentially represent an exophytic hemangioma. Musculoskeletal: No aggressive osseous lesion. Large subcutaneous nodule in the central chest measuring 3.2 cm not changed. IMPRESSION: 1. Interval decrease in size of RIGHT upper lobe nodule that was hypermetabolic on comparison PET-CT scan. 2. Peripheral reticular nodular pattern in the RIGHT upper lobe likely represents postradiation change. 3. Stable small LEFT lower lobe pulmonary nodule. 4. No evidence of mediastinal adenopathy. 5. Small LEFT adrenal adenoma unchanged. 6. Ill-defined benign-appearing soft tissue adjacent to the RIGHT hepatic lobe is stable. 7. Subcutaneous nodule in the medial chest wall unchanged and not hypermetabolic comparison PET-CT scan. Electronically Signed   By: Suzy Bouchard M.D.   On: 04/27/2016 16:30    ASSESSMENT AND PLAN: This is a very pleasant 75 years old African-American male with unresectable stage II a non-small cell lung cancer, squamous cell carcinoma status post a course of concurrent chemoradiation with weekly carboplatin and paclitaxel.Status post 7 cycles. The patient tolerated his treatment fairly well with no significant adverse effects. The recent CT scan of the chest showed interval decrease in the size of the right upper lobe nodule consistent with improvement of his disease. I personally reviewed the images and I discussed the results with the patient and his family. I would him the option of continuous observation and close monitoring versus consideration of consolidation chemotherapy. His performance status is not good enough to proceed with further systemic chemotherapy at this point. I recommended for the patient to continue on observation for now with repeat CT scan of the chest in 3 months for restaging of his disease. His recent history of GI bleed, I advised the patient to discuss with his cardiologist the need to  continue on Plavix and aspirin. I also recommended for the patient to discontinue treatment with Decadron. He was advised to call immediately if he has any concerning symptoms in the interval. The patient voices understanding of current disease status and treatment options and is in agreement with the current care plan.  All questions were answered. The patient knows to call the clinic with any problems, questions or concerns. We can certainly see the patient much sooner if necessary.  Disclaimer: This note was dictated with voice recognition software. Similar sounding words can inadvertently be transcribed and may not be corrected upon review.

## 2016-05-02 ENCOUNTER — Ambulatory Visit: Payer: Medicare Other | Admitting: Pulmonary Disease

## 2016-05-02 ENCOUNTER — Encounter: Payer: Self-pay | Admitting: Endocrinology

## 2016-05-02 ENCOUNTER — Ambulatory Visit (INDEPENDENT_AMBULATORY_CARE_PROVIDER_SITE_OTHER): Payer: Medicare Other | Admitting: Endocrinology

## 2016-05-02 VITALS — BP 132/54 | HR 104 | Temp 98.2°F | Ht 73.0 in | Wt 161.0 lb

## 2016-05-02 DIAGNOSIS — K922 Gastrointestinal hemorrhage, unspecified: Secondary | ICD-10-CM | POA: Diagnosis not present

## 2016-05-02 DIAGNOSIS — E1159 Type 2 diabetes mellitus with other circulatory complications: Secondary | ICD-10-CM

## 2016-05-02 DIAGNOSIS — D649 Anemia, unspecified: Secondary | ICD-10-CM | POA: Diagnosis not present

## 2016-05-02 DIAGNOSIS — E785 Hyperlipidemia, unspecified: Secondary | ICD-10-CM

## 2016-05-02 DIAGNOSIS — M109 Gout, unspecified: Secondary | ICD-10-CM

## 2016-05-02 DIAGNOSIS — I6523 Occlusion and stenosis of bilateral carotid arteries: Secondary | ICD-10-CM | POA: Diagnosis not present

## 2016-05-02 LAB — LIPID PANEL
CHOL/HDL RATIO: 3
CHOLESTEROL: 136 mg/dL (ref 0–200)
HDL: 47.5 mg/dL (ref >39.00)
LDL Cholesterol: 77 mg/dL (ref 0–99)
NonHDL: 88.06
Triglycerides: 57 mg/dL (ref 0.0–149.0)
VLDL: 11.4 mg/dL (ref 0.0–40.0)

## 2016-05-02 LAB — IBC PANEL
Iron: 24 ug/dL — ABNORMAL LOW (ref 42–165)
SATURATION RATIOS: 7.1 % — AB (ref 20.0–50.0)
Transferrin: 240 mg/dL (ref 212.0–360.0)

## 2016-05-02 LAB — URIC ACID: Uric Acid, Serum: 4.6 mg/dL (ref 4.0–7.8)

## 2016-05-02 LAB — POCT GLYCOSYLATED HEMOGLOBIN (HGB A1C): Hemoglobin A1C: 5.4

## 2016-05-02 MED ORDER — LOSARTAN POTASSIUM-HCTZ 100-12.5 MG PO TABS: 1.0000 | ORAL_TABLET | Freq: Every day | ORAL | Status: DC

## 2016-05-02 MED ORDER — PANTOPRAZOLE SODIUM 40 MG PO TBEC: 40.0000 mg | DELAYED_RELEASE_TABLET | Freq: Every day | ORAL | Status: DC

## 2016-05-02 MED ORDER — ALLOPURINOL 100 MG PO TABS: 100.0000 mg | ORAL_TABLET | Freq: Every day | ORAL | Status: DC

## 2016-05-02 NOTE — Patient Instructions (Addendum)
blood tests are requested for you today.  We'll let you know about the results.   Please reduce the allopurinol to 100 mg daily.  Also, please reduce the losartan-HCTZ to 100/12.5, 1 per day.  Please see a specialist, to follow up the stomach bleeding.  you will receive a phone call, about a day and time for an appointment i have sent a prescription to your pharmacy, to refill the pantoprazole, pending that appointment. Please continue the same potassium pills.   Please come back for a follow-up appointment in 4 months.

## 2016-05-02 NOTE — Progress Notes (Signed)
Subjective:    Patient ID: Ryan Contreras, male    DOB: August 08, 1941, 75 y.o.   MRN: [id number]  HPI  The state of at least three ongoing medical problems is addressed today, with interval history of each noted here: He was recently in [facility] for GIB.  Denies BRBPR.  He does not take fe tabs.  He is not sure if he is taking the protonix.  Gout: no recent sxs HTN: he denies sob. Past Medical History  Diagnosis Date  . Gout, unspecified     foot  . HYPERLIPIDEMIA   . HYPERTENSION   . PEPTIC ULCER DISEASE   . TOBACCO ABUSE   . Type 2 diabetes mellitus ([place])     denies on 01-06-16  . COPD (chronic obstructive pulmonary disease) ([place])     due to 50+ years Smoking.  . CAD, NATIVE VESSEL     coronary stent x1-tx. Plavix use.  . Gastrointestinal bleeding, upper 04/29/2016    Past Surgical History  Procedure Laterality Date  . Coronary stent placement      s/p  . Cardiac catheterization      '06-stent placed, 6'13 - Dr. Lizbeth Bark follows  . Endobronchial ultrasound Bilateral 01/11/2016    Procedure: ENDOBRONCHIAL ULTRASOUND;  Surgeon: Collene Gobble, MD;  Location: [facility];  Service: Endoscopy;  Laterality: Bilateral;    Social History   Social History  . Marital Status: Divorced    Spouse Name: N/A  . Number of Children: 2  . 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 Smoking status: Current Every Day Smoker -- 0.50 packs/day for 50 years    Types: Cigarettes  . Smokeless tobacco: Never Used  . Alcohol Use: 0.0 oz/week    0 Standard drinks or equivalent per week     Comment: 1 beer/day  . Drug Use: No  . Sexual Activity: Not on file   Other Topics Concern  . Not on file   Social History Narrative   Widowed 2003    Current Outpatient Prescriptions on File Prior to Visit  Medication Sig Dispense Refill  . acetaminophen (TYLENOL) 500 MG tablet Take 250 mg by mouth every 6 (six) hours as needed for  moderate pain. Reported on 04/29/2016    . acetaZOLAMIDE (DIAMOX) 250 MG tablet Take 1 tablet (250 mg total) by mouth daily. 30 tablet 2  . aspirin 81 MG tablet Take 81 mg by mouth at bedtime. Reported on 05/02/2016    . atorvastatin (LIPITOR) 80 MG tablet TAKE 1 TABLET (80 MG TOTAL) BY MOUTH DAILY. 30 tablet 6  . clopidogrel (PLAVIX) 75 MG tablet Take 1 tablet (75 mg total) by mouth daily. Please Restart this medication on Tuesday 01/12/16. 30 tablet 10  . KLOR-CON M10 10 MEQ tablet     . methocarbamol (ROBAXIN) 500 MG tablet Take 500 mg by mouth every morning. Reported on 04/29/2016    . mometasone-formoterol (DULERA) 200-5 MCG/ACT AERO Inhale 2 puffs into the lungs 2 (two) times daily. 1 Inhaler 6  . ranitidine (ZANTAC) 150 MG tablet Take 150 mg by mouth daily.  1  . senna-docusate (SENOKOT-S) 8.6-50 MG tablet Take by mouth.    . sildenafil (VIAGRA) 100 MG tablet Take 1 tablet (100 mg total) by mouth as needed for erectile dysfunction. 1 tablet 4   No current facility-administered medications on file prior to visit.    No Known Allergies  Family History  Problem Relation Age of Onset  . Cancer Sister     Uncertain type  . Cancer Brother     Uncertain type  . Arthritis Mother   . Hypertension Father     BP 132/54 mmHg  Pulse 104  Temp(Src) 98.2 F (36.8 C) (Oral)  Ht '6\' 1"'$  (1.854 m)  Wt 161 lb (73.029 kg)  BMI 21.25 kg/m2  SpO2 96%   Review of Systems Denies chest pain and edema. He has lost 21 lbs.     Objective:   Physical Exam VITAL SIGNS:  See vs page GENERAL: no distress Pulses: dorsalis pedis intact bilat.   MSK: no deformity of the feet CV: no leg edema Skin:  no ulcer on the feet.  normal color and temp on the feet. Neuro: sensation is intact to touch on the feet.    Lab Results  Component Value Date   CREATININE 0.8 04/18/2016   BUN 16.1 04/18/2016   NA 136 04/18/2016   K 3.7 04/18/2016   CL 95* 09/11/2015   CO2 29 04/18/2016   Lab Results    Component Value Date   LABURIC 4.6 05/02/2016   Iron is low    Assessment & Plan:  HTN: medication need is reduced due to weight loss. Weight loss, prob due to lung cancer.  We'll follow. Gout: well-controlled:  She needs lees allopurinol Hypokalemia: well-controlled. GIB: clinically better. Anemia, due to fe-deficiency.  i advised pt to take fe 1/day.   Patient is advised the following: Patient Instructions  blood tests are requested for you today.  We'll let you know about the results.   Please reduce the allopurinol to 100 mg daily.  Also, please reduce the losartan-HCTZ to 100/12.5, 1 per day.  Please see a specialist, to follow up the stomach bleeding.  you will receive a phone call, about a day and time for an appointment i have sent a prescription to your pharmacy, to refill the pantoprazole, pending that appointment. Please continue the same potassium pills.   Please come back for a follow-up appointment in 4 months.

## 2016-05-04 ENCOUNTER — Telehealth: Payer: Self-pay | Admitting: Endocrinology

## 2016-05-04 NOTE — Telephone Encounter (Signed)
Patient call for the result of his lab work

## 2016-05-04 NOTE — Telephone Encounter (Signed)
I contacted the pt and advised his lab work was good and Dr. Loanne Drilling would like for him to start taking a otc iron supplement daily. Pt voiced understanding.

## 2016-05-16 ENCOUNTER — Ambulatory Visit: Payer: Medicare Other | Admitting: Endocrinology

## 2016-05-23 ENCOUNTER — Ambulatory Visit (INDEPENDENT_AMBULATORY_CARE_PROVIDER_SITE_OTHER): Payer: BC Managed Care – PPO | Admitting: Pulmonary Disease

## 2016-05-23 ENCOUNTER — Encounter: Payer: Self-pay | Admitting: Pulmonary Disease

## 2016-05-23 VITALS — BP 108/64 | HR 98 | Temp 98.1°F | Ht 73.0 in | Wt 167.6 lb

## 2016-05-23 DIAGNOSIS — C3491 Malignant neoplasm of unspecified part of right bronchus or lung: Secondary | ICD-10-CM

## 2016-05-23 DIAGNOSIS — I10 Essential (primary) hypertension: Secondary | ICD-10-CM

## 2016-05-23 DIAGNOSIS — J449 Chronic obstructive pulmonary disease, unspecified: Secondary | ICD-10-CM

## 2016-05-23 DIAGNOSIS — I2510 Atherosclerotic heart disease of native coronary artery without angina pectoris: Secondary | ICD-10-CM

## 2016-05-23 DIAGNOSIS — Z720 Tobacco use: Secondary | ICD-10-CM | POA: Diagnosis not present

## 2016-05-23 DIAGNOSIS — F17210 Nicotine dependence, cigarettes, uncomplicated: Secondary | ICD-10-CM

## 2016-05-23 NOTE — Progress Notes (Addendum)
Subjective:     Patient ID: Ryan Contreras, male   DOB: [birth year], 75 y.o.   MRN: [id number]  HPI  ~  10/27/14:  730moROV w/ KC>        The patient comes in today for follow-up of his known severe COPD. He is staying on Symbicort compliantly, but has not been able to afford Spiriva in the past. Unfortunately, he continues to smoke, and really has no intention to quit. I have talked with him further about this today. He has not had an acute exacerbation or pulmonary infection since last visit. He has mild cough with only white sputum production.      REC>  The patient has been stable since the last visit on his current bronchodilator regimen, but unfortunately continues to smoke. I have had a long discussion with him again about the importance of total smoking cessation. I've also encouraged him to work aggressively on some type of conditioning program.   - Continue on Symbicort 2 puffs am and pm everyday. Rinse mouth well. - Keep your albuterol handy for rescue if needed. - Stop smoking.This is the most important part of your treatment. - Followup with me again in 672mo ~  04/27/15:  730m47moV w/ KC>        Patient comes in today for follow-up of his known COPD. He is staying on his bronchodilator regimen, but unfortunately continues to smoke. He feels that his exertional tolerance is at baseline, and has not had an acute exacerbation since the last visit. He does continue to have cough and nonpurulent mucus, but I have explained this is directly related to his smoking.      REC>  The patient appears to be at his usual baseline from the last visit, and denies any recent acute exacerbation. He is staying on Symbicort, as well as as needed albuterol. Unfortunately, he continues to smoke, and I have stressed to him the importance of total smoking cessation. I have asked him to stay as active as possible, and to follow-up again in 6 months. - Stay on symbicort everyday - Work on some type of exercise -  Try to quit smoking.This is the most important treatment for you - Followup with Dr. 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   He started smoking in his teens, he has been smoking for 55 yrs up to 1ppd, and decr to 1/2ppd recently...      EXAM shows Afeb, VSS, O2sat=93% on RA at rest;  HEENT- neg, Mallampati4;  Chest- decr BS bilat & few scat rhonchi, no w/r;  Heart- RR w/o m/r/g heard;  Abd- soft, neg;  Ext- VI, no c/c/e;  Neuro- no focal deficits...   PFT 05/20/11 showed FVC=3.52 (72%), FEV1=1.52 (46%), %1sec=43, mid-flows reduced at 19% predicted; post bronchodil there was a 7% improvement in FEV1;  TLC=6.64 (91%), RV=3.04 (113%), RV/TLC=46;  DLCO=80% predicted.  CXR 04/13/15 showed norm heart size, COPD, mild right base Atx, NAD...   LABS 9-09/2015:  Chems- ok x HCO3=42  indicative of CO2 retention & renal compensation (he may benefit from Diamox);  CBC- wnl;  TSH=0.74;  BNP=216...  NOTE> he is on Hyzaar 100-25 AND Lasix20... IMP/PLAN>>  Ryan Contreras desperately needs to quit smoking- discussed Nicotine replacement options and offered Chantix;  Breathing meds refilled;  I rec that he STOP the Lasix for now & substitute ACETAZOLAMIDE 223m one tab daily in the afternoon;  He has already had the 2016 Flu vaccine;  Plan ROV in 730moooner if needed prn...  ADDENDUM>>  Ryan Contreras had a Low-dose Screening Chest CT 11/26/15- norm heart size, atherosclerosis of Ao & all 3 coronaries, stent in RCA, right hilar fullness c/w adenopathy, 2cm medial RUL nodule, ?medial aspect of left superior segment?, mild centrilobular & paraseptal  emphysema... ADDENDUM>>  PET scan was performed 12/11/15- 2.7 x 2.0 spiculated nodule in medial RUL is hypermetabolic w/ hypermet lymphadenopathy in the right hilum, no other hypermet lymphadenopathy; 1157mubsolid nodule in medial LLL shows very low grade metabolic activ (SUVFYBOFB=5.1no hypermet activ in Abd x enlarged prostate w/ TURP defect... ADDENDUM>>  He had Bronch w/ EBUS 01/11/16 by [name] w/ path c/w a non-small cell lung cancer (favored to be a squamous cell lesion); subsequent MRI Brain 02/04/16 was neg for signs of metastatic dis & MTOC eval & lesion felt to be unresectable Stage IIa non-small cell Ca; seen by [name] & [name] w/ combined weekly ChemoRadiation w/ carboplatin & paclitaxel x 7cycles last 03/14/16, and [place] but there are no notes in EPIC regarding his XRT treatment...  ADDENDUM>>  Follow up medical oncology appt w/ [name] 04/29/16 w/ CT Chest done 5/17- irreg mass in medial RUL is smaller than prev & assoc peripheral reticular changes likely from his XRT, LLL central 9mm92ml-defined nodule is unchanged & other comparators likewise unchanged... [name] offered continued observation vs consolidative chemotherapy but his performance status was poor so they decided on f/u CT in 3 months...  ~  May 23, 2016:  68mo 20mow/ SN>  Ryan Contreras diagnosed & treated for a non-small cell lung cancer in his right hilum- see above;  He also developed abd pain/ GIB & was [facility] - 04/[facility]16 w/ Hg=4.3, required 5u transfusion & eval by GI- acute & chronic gastritis/ duodenitis & ?mass in duodenum but Bx was neg x inflamm (Bx also neg for HPylori but his HPylori Ab test was pos);  He was treated w/ triple therapy & improved, they made the decision to restart his ASA/Plavix at disch due to his CAD & stent; Hg improved to the 9-10 range and this is to be followed up by his PCP- DrEllPesotumeviewed records in Care Everywhere)...      Ryan Contreras continues on his Dulera200- 2spBid but unfortunately is also still smoking despite all he's been through- he declines smoking cessation help, encouraged to try nicotine replacement rx or consider Chantix;  We reviewed the following medical problems during today's office visit >>     Severe airflow obstruction w/ GOLD Stage3 COPD>  He is on Dulera200-2spBid & ProairHFA prn; must quit smoking, stay active...     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    Lung Cancer>  diagnosed & treated 12/2015 for a non-small cell lung cancer in his right hilum; given  combined ChemoXRT by [name] & Wentworth=>DrPalmero in [place]; f/u CT Chest by [name] 04/2016=> he plans f/u CT 52mo     On-going tobacco abuse>  He continues to smoke ?several cigs/d; we discussed smoking cessation options...     Cardiac>  HBP, CAD (DrRoss) w/ RCA stent, extensive atherosclerosis in Ao etc; on ASA81, Plavix75 (restarted after 03/2016 GIB), Hyzaar100-12.5, K10.    GI>  [facility] 4/12 - 04/01/16 w/ Hg=4.3, required 5u transfusion & eval by GI- acute & chronic gastritis/ duodenitis & ?mass in duodenum but Bx was neg x inflamm (Bx also neg for HPylori but his HPylori Ab test was pos);  He was treated w/ triple therapy & improved, they made the decision to restart his ASA/Plavix at disch due to his CAD & stent; Hg improved to the 9-10 range and this is to be followed up by his PCP- DOquawka(I reviewed records in Care Everywhere)...     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      EXAM shows Afeb, VSS, O2sat=97% on RA at rest;  HEENT-  neg, Mallampati4;  Chest- decr BS bilat & few scat rhonchi, no w/r;  Heart- RR gr1/6 SEM, w/o /r/g heard;  Abd- soft, nontender;  Ext- VI, no c/c/e;  Neuro- no focal deficits...   [facility]aging [facility] and [facility] [place] [facility] [place] IMP/PLAN>>  Problems as listed;  Deanthony needs to quit all smoking & grad increase his exercise program;  Continue Dulera200-2spBid & add MUCINEX '600mg'$  tabs- 2Bid w/ fluids;  He will maintain f/u w/ his PCP, Oncologist, & GI;  We plan recheck in 654mosooner if needed for any reason...     Past Medical History  Diagnosis Date   COPD w/ severe airflow obstruction 7 GOLD Stage 3 COPD> on Dulera200-2spBid    Lung Cancer> right hilar mass found on low dose screening CT 11/2015, Bx= non small cell Ca (SCCa favored) & treated w/ chemoradiation completed 03/2016...   . CAD, NATIVE VESSEL >> followed by DrRoss, CATH 05/2012= 60%LAD, 40%D1, 40%Circ, 40%RCA, EF=55% w/ min inferobasal HK... He has Hx PTA/stent in RCA   . Gout, unspecified   . HYPERLIPIDEMIA   . HYPERTENSION   . PEPTIC ULCER DISEASE           [facility], Hg=4.3, required 5u Tx, eval w/ gastitis & duodenitis w/ ?duodenal mass- neg Bx (+HPylori Ab test); treated w/ triple therapy & improved...   . TOBACCO ABUSE   . Type 2 diabetes mellitus (HCC)    ANEMIA> acute GI blood loss anemia 03/2016 => on Fe     Past Surgical History  Procedure Laterality Date  . Coronary stent placement      s/p  . Cardiac catheterization      '06-stent placed, 6'13 - Dr. P.Lizbeth Barkollows  . Endobronchial ultrasound Bilateral 01/11/2016    Procedure: ENDOBRONCHIAL ULTRASOUND;  Surgeon: RoCollene GobbleMD;  Location: [facility] ENDOSCOPY;  Service: Endoscopy;  Laterality: Bilateral;    Outpatient Encounter Prescriptions as of 05/23/2016  Medication Sig  . acetaminophen (TYLENOL) 500 MG tablet Take 250 mg by mouth every 6 (six) hours as needed for moderate pain. Reported on 04/29/2016  . acetaZOLAMIDE (DIAMOX)  250 MG tablet Take  1 tablet (250 mg total) by mouth daily.  Marland Kitchen allopurinol (ZYLOPRIM) 300 MG tablet Take 300 mg by mouth daily.  Marland Kitchen aspirin 81 MG tablet Take 81 mg by mouth at bedtime. Reported on 05/02/2016  . atorvastatin (LIPITOR) 80 MG tablet TAKE 1 TABLET (80 MG TOTAL) BY MOUTH DAILY.  Marland Kitchen clopidogrel (PLAVIX) 75 MG tablet Take 1 tablet (75 mg total) by mouth daily. Please Restart this medication on Tuesday 01/12/16.  . ferrous sulfate 325 (65 FE) MG tablet Take 325 mg by mouth daily with breakfast.  . KLOR-CON M10 10 MEQ tablet Take 10 mEq by mouth daily.   Marland Kitchen losartan-hydrochlorothiazide (HYZAAR) 100-12.5 MG tablet Take 1 tablet by mouth daily.  . methocarbamol (ROBAXIN) 500 MG tablet Take 500 mg by mouth every morning. Reported on 04/29/2016  . mometasone-formoterol (DULERA) 200-5 MCG/ACT AERO Inhale 2 puffs into the lungs 2 (two) times daily.  . pantoprazole (PROTONIX) 40 MG tablet Take 1 tablet (40 mg total) by mouth daily.  . ranitidine (ZANTAC) 150 MG tablet Take 150 mg by mouth daily.  Marland Kitchen senna-docusate (SENOKOT-S) 8.6-50 MG tablet Take 1 tablet by mouth at bedtime.   . sildenafil (VIAGRA) 100 MG tablet Take 1 tablet (100 mg total) by mouth as needed for erectile dysfunction.  . [DISCONTINUED] allopurinol (ZYLOPRIM) 100 MG tablet Take 1 tablet (100 mg total) by mouth daily.   No facility-administered encounter medications on file as of 05/23/2016.    No Known Allergies   Immunization History  Administered Date(s) Administered  . Influenza Whole 08/11/2010, 10/10/2012  . Influenza,inj,Quad PF,36+ Mos 09/11/2013  . Influenza-Unspecified 09/11/2014, 09/14/2015  . Pneumococcal Conjugate-13 06/03/2009  . Pneumococcal Polysaccharide-23 08/11/2010  . Td 12/13/2002    Current Medications, Allergies, Past Medical History, Past Surgical History, Family History, and Social History were reviewed in [employer] record.   Review of Systems  Constitutional: Negative  for fever and unexpected weight change.  HENT: Negative for congestion, dental problem, ear pain, nosebleeds, postnasal drip, rhinorrhea, sinus pressure, sneezing, sore throat and trouble swallowing.  Eyes: Negative for redness and itching.  Respiratory: Positive for cough and no wheezing. Negative for chest tightness and shortness of breath.  Cardiovascular: Negative for palpitations and leg swelling.  Gastrointestinal: Negative for nausea and vomiting.  Genitourinary: Negative for dysuria.  Musculoskeletal: Negative for joint swelling.  Skin: Negative for rash.  Neurological: Negative for headaches.  Hematological: Does not bruise/bleed easily.  Psychiatric/Behavioral: Negative for dysphoric mood. The patient is not nervous/anxious.    Objective:   Physical Exam    sl overweight male in no acute distress Nose without purulence or discharge noted Neck without lymphadenopathy or thyromegaly Chest with decreased breath sounds, no active wheezing Cardiac exam with regular rate and rhythm Lower extremities without edema, no cyanosis Alert and oriented, moves all 4 extremities.   Assessment:     IMP >>     Severe airflow obstruction w/ GOLD Stage3 COPD>  He is on Dulera200-2spBid & ProairHFA prn; must quit smoking, stay active...     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    On-going tobacco abuse>  He continues to smoke ?several cigs/d; we discussed smoking cessation options...     Cardiac>  HBP, CAD (DrRoss) w/ RCA stent, extensive atherosclerosis in Ao etc; on ASA81, Plavix75 (restarted after 03/2016 GIB),  Hyzaar100-12.5, K10.    Medical>  HL on Lip80, BPH w/ BOO, Elev PSA ([place]), Gout on Allopurinol300, & ADM to [facility] 03/2016 w/ GIB- gastitis, duodenitis, ?duodenal mass, +HPylori Ab test (neg Bx) and treated w/  triple therapy & improved (now taking Protonix40, Zantac150, Senakot-S); Anemia- on FeSO4...  PLAN >>  11/02/15>   Dajohn desperately needs to quit smoking- discussed Nicotine replacement options and offered Chantix;  Breathing meds refilled;  I rec that he STOP the Lasix for now & substitute ACETAZOLAMIDE '250mg'$  one tab daily in the afternoon;  He has already had the 2016 Flu vaccine;  Plan ROV in 51mosooner if needed prn. 05/23/16>   Problems as listed;  Remigio needs to quit all smoking & grad increase his exercise program;  Continue Dulera200-2spBid & add MUCINEX '600mg'$  tabs- 2Bid w/ fluids;  He will maintain f/u w/ his PCP, Oncologist, & GI;  We plan recheck in 631mosooner if needed for any reason...      Plan:     Patient's Medications  New Prescriptions   No medications on file  Previous Medications   ACETAMINOPHEN (TYLENOL) 500 MG TABLET    Take 250 mg by mouth every 6 (six) hours as needed for moderate pain. Reported on 04/29/2016   ACETAZOLAMIDE (DIAMOX) 250 MG TABLET    Take 1 tablet (250 mg total) by mouth daily.   ALLOPURINOL (ZYLOPRIM) 300 MG TABLET    Take 300 mg by mouth daily.   ASPIRIN 81 MG TABLET    Take 81 mg by mouth at bedtime. Reported on 05/02/2016   ATORVASTATIN (LIPITOR) 80 MG TABLET    TAKE 1 TABLET (80 MG TOTAL) BY MOUTH DAILY.   CLOPIDOGREL (PLAVIX) 75 MG TABLET    Take 1 tablet (75 mg total) by mouth daily. Please Restart this medication on Tuesday 01/12/16.   FERROUS SULFATE 325 (65 FE) MG TABLET    Take 325 mg by mouth daily with breakfast.   KLOR-CON M10 10 MEQ TABLET    Take 10 mEq by mouth daily.    LOSARTAN-HYDROCHLOROTHIAZIDE (HYZAAR) 100-12.5 MG TABLET    Take 1 tablet by mouth daily.   METHOCARBAMOL (ROBAXIN) 500 MG TABLET    Take 500 mg by mouth every morning. Reported on 04/29/2016   MOMETASONE-FORMOTEROL (DULERA) 200-5 MCG/ACT AERO    Inhale 2 puffs into the lungs 2 (two) times daily.   PANTOPRAZOLE (PROTONIX) 40 MG TABLET    Take 1 tablet (40 mg total) by mouth  daily.   RANITIDINE (ZANTAC) 150 MG TABLET    Take 150 mg by mouth daily.   SENNA-DOCUSATE (SENOKOT-S) 8.6-50 MG TABLET    Take 1 tablet by mouth at bedtime.    SILDENAFIL (VIAGRA) 100 MG TABLET    Take 1 tablet (100 mg total) by mouth as needed for erectile dysfunction.  Modified Medications   No medications on file  Discontinued Medications   ALLOPURINOL (ZYLOPRIM) 100 MG TABLET    Take 1 tablet (100 mg total) by mouth daily.

## 2016-05-23 NOTE — Patient Instructions (Signed)
Today we updated your med list in our EPIC system...    Continue your current medications the same...  Wilkie-- you need to QUIT the last of those nasty cigarettes!!!    And increase your exercise program- join the [employer], [employer], etc...  Be sure to use the DULERA-- 2 sprys twice daily     And add-in the OTC MUCINEX '600mg'$  - 2 tabs twice daily w/ fluids... ] Call for any questions...  Let's plan a follow up visit in 74mo sooner if needed for problems..Marland KitchenMarland Kitchen

## 2016-05-26 ENCOUNTER — Encounter: Payer: Self-pay | Admitting: Internal Medicine

## 2016-06-06 ENCOUNTER — Other Ambulatory Visit: Payer: Self-pay

## 2016-06-06 ENCOUNTER — Telehealth: Payer: Self-pay | Admitting: Endocrinology

## 2016-06-06 NOTE — Telephone Encounter (Signed)
Requested a call back from the pt to discuss.  

## 2016-06-06 NOTE — Telephone Encounter (Signed)
Patient wants CMA to give him a return call concerning an Endoscopy.

## 2016-06-15 ENCOUNTER — Other Ambulatory Visit: Payer: Self-pay | Admitting: *Deleted

## 2016-06-15 MED ORDER — CLOPIDOGREL BISULFATE 75 MG PO TABS: 75.0000 mg | ORAL_TABLET | Freq: Every day | ORAL | Status: DC

## 2016-06-21 ENCOUNTER — Other Ambulatory Visit: Payer: Self-pay | Admitting: *Deleted

## 2016-06-21 MED ORDER — CLOPIDOGREL BISULFATE 75 MG PO TABS: 75.0000 mg | ORAL_TABLET | Freq: Every day | ORAL | Status: DC

## 2016-07-04 ENCOUNTER — Encounter: Payer: Self-pay | Admitting: Endocrinology

## 2016-07-11 ENCOUNTER — Ambulatory Visit (INDEPENDENT_AMBULATORY_CARE_PROVIDER_SITE_OTHER): Payer: Medicare Other | Admitting: Cardiology

## 2016-07-11 ENCOUNTER — Encounter: Payer: Self-pay | Admitting: Cardiology

## 2016-07-11 VITALS — BP 100/60 | HR 100 | Ht 73.0 in | Wt 172.1 lb

## 2016-07-11 DIAGNOSIS — I2510 Atherosclerotic heart disease of native coronary artery without angina pectoris: Secondary | ICD-10-CM

## 2016-07-11 DIAGNOSIS — I6523 Occlusion and stenosis of bilateral carotid arteries: Secondary | ICD-10-CM

## 2016-07-11 NOTE — Progress Notes (Signed)
07/11/2016 Ryan Contreras   1941-08-20  [id number]  Primary Physician Ryan Shin, MD Primary Cardiologist: Dr. Harrington Contreras  Reason for Visit/CC: 6 month f/u for CAD  HPI:  75 y/o male, followed by Dr. Harrington Contreras, who presents to clinic for routine f/u. He has been followed for CAD. He also has a h/o HTN, HLD, carotid artery disease, tobacco use and COPD. His last intervention was PTCA/stent to RCA for instent restenosis. His last LHC was 05/2012. Angiographic details are as follows:  LM: Normal LAD: normal proximally, mid vessel has a hypodense area with moderate 60% stenosis.  Difficult to isolate from D1 take off.  Looks similar to last cath in 2006 Distal vessel is normal      D1: 40% ostial  Circumflex: 40% mid vessel disease        OM1: large branching artery mid circumflex lesion involves take off of OM 40% RCA: Widely patent stent in proximal vessel  40% mid vessel disease normal distal vessel                       PDA: normal                       PLA: normal  Ventriculography: EF: 55 %, minimsl inferobasal hypokinesis  Findings were similar to prior [name] in 2006. No intervention was performed. Continued medical therapy was recommended. His last 2D echo was also in 2013. EF was normal at 55-60%.   He was last seen by Dr. Harrington Contreras 09/2015 and was stable from a cardiac standpoint. He now presents to clinic for 6 month f/u.   Since his last OV, he notes he was recently diagnosed with Lung Cancer in March. This is followed by Dr. Julien Contreras. He is undergoing chemo/ radiation. He continues to smoke ~1 cigarette a day. He also was hospitalization at [facility] 05/2016 for GIB 2/2 a peptid ulcer. He was cleared by GI to resume ASA and Plavix post discharge. He denies any recurrent issues. No melena or hematochezia.   He states he has done well from a cardiac standpoint. He denies CP. No anginal symptoms with exertional activities. No significant dyspnea. No dizziness, palpitations, syncope/ near  syncope. BP is well controlled at 100/60. He recently had a fasting lipid panel 04/2016 that showed LDL at 77 mg/dL. He also had bilateral carotid dopplers 04/2016 that showed stable mild plaquing of carotids, for which medical therapy was recommended.    Current Outpatient Prescriptions  Medication Sig Dispense Refill  . acetaminophen (TYLENOL) 500 MG tablet Take 250 mg by mouth every 6 (six) hours as needed for moderate pain. Reported on 04/29/2016    . acetaZOLAMIDE (DIAMOX) 250 MG tablet Take 1 tablet (250 mg total) by mouth daily. 30 tablet 2  . allopurinol (ZYLOPRIM) 300 MG tablet Take 300 mg by mouth daily.  2  . aspirin 81 MG tablet Take 81 mg by mouth at bedtime. Reported on 05/02/2016    . atorvastatin (LIPITOR) 80 MG tablet TAKE 1 TABLET (80 MG TOTAL) BY MOUTH DAILY. 30 tablet 6  . clopidogrel (PLAVIX) 75 MG tablet Take 1 tablet (75 mg total) by mouth daily. Please call and schedule a one year follow up appointment 90 tablet 0  . ferrous sulfate 325 (65 FE) MG tablet Take 325 mg by mouth daily with breakfast.    . KLOR-CON M10 10 MEQ tablet Take 10 mEq by mouth daily.     Marland Kitchen  losartan-hydrochlorothiazide (HYZAAR) 100-12.5 MG tablet Take 1 tablet by mouth daily. 90 tablet 3  . mometasone-formoterol (DULERA) 200-5 MCG/ACT AERO Inhale 2 puffs into the lungs 2 (two) times daily. 1 Inhaler 6  . pantoprazole (PROTONIX) 40 MG tablet Take 1 tablet (40 mg total) by mouth daily. 90 tablet 3  . ranitidine (ZANTAC) 150 MG tablet Take 150 mg by mouth daily.  1  . senna-docusate (SENOKOT-S) 8.6-50 MG tablet Take 1 tablet by mouth at bedtime.     . sildenafil (VIAGRA) 100 MG tablet Take 1 tablet (100 mg total) by mouth as needed for erectile dysfunction. 1 tablet 4   No current facility-administered medications for this visit.     No Known Allergies  Social History   Social History  . Marital status: Divorced    Spouse name: N/A  . Number of children: 2  . Years of education: N/A    Occupational History  . 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 Smoking status: Current Every Day Smoker    Packs/day: 0.50    Years: 50.00    Types: Cigarettes  . Smokeless tobacco: Never Used     Comment: currently smoking 1cig/day (05/23/16)  . Alcohol use 0.0 oz/week     Comment: 1 beer/day  . Drug use: No  . Sexual activity: Not on file   Other Topics Concern  . Not on file   Social History Narrative   Widowed 2003     Review of Systems: General: negative for chills, fever, night sweats or weight changes.  Cardiovascular: negative for chest pain, dyspnea on exertion, edema, orthopnea, palpitations, paroxysmal nocturnal dyspnea or shortness of breath Dermatological: negative for rash Respiratory: negative for cough or wheezing Urologic: negative for hematuria Abdominal: negative for nausea, vomiting, diarrhea, bright red blood per rectum, melena, or hematemesis Neurologic: negative for visual changes, syncope, or dizziness All other systems reviewed and are otherwise negative except as noted above.    Height '6\' 1"'$  (1.854 m), weight 172 lb 1.9 oz (78.1 kg).  General appearance: alert, cooperative and no distress Neck: no carotid bruit and no JVD Lungs: decreased BS bilaterally Heart: regular rate and rhythm, S1, S2 normal, no murmur, click, rub or gallop Extremities: no LEE Pulses: 2+ and symmetric Skin: warm and dry Neurologic: Grossly normal  EKG not performed  ASSESSMENT AND PLAN:   1. CAD: stable w/o angina. Continue medical therapy. ASA, Plavix, statin ARB.  2. HTN: well controlled on current regimen.  3. HLD: recent FLP showed LDL to be decently controlled at 77 mg/dL. Continue statin therapy with Lipitor.  4. Carotid Artery Disease: recent dopplers 04/2016 showed stable mild plaquing of carotids. He is asymptomatic. Continue medical therapy. ASA, Plavix and statin. BP is well controlled.   5. Lung Cancer: recently diagnosed 02/2016.  Followed by Dr. Julien Contreras. Undergoing chemo/radiation.   6. Tobacco Abuse: he continues to smoke 1 cigarette a day despite recent lung CA diagnosis. Complete smoking cessation strongly advised.   7. PUD: s/p GIB 05/2016 treated at [facility]. He was cleared by GI to resume ASA and Plavix post discharge. He denies any recurrent issues. No melena or hematochezia.      PLAN  F/u with Dr. Harrington Contreras in 6 months.   Lyda Jester PA-C 07/11/2016 8:54 AM

## 2016-07-11 NOTE — Patient Instructions (Signed)
Your physician wants you to follow-up in: 6 months or sooner if needed with Dr. Harrington Challenger. You will receive a reminder letter in the mail two months in advance. If you don't receive a letter, please call our office to schedule the follow-up appointment.  If you need a refill on your cardiac medications before your next appointment, please call your pharmacy.

## 2016-07-18 ENCOUNTER — Other Ambulatory Visit: Payer: Self-pay | Admitting: Internal Medicine

## 2016-07-18 DIAGNOSIS — Z5111 Encounter for antineoplastic chemotherapy: Secondary | ICD-10-CM

## 2016-07-18 DIAGNOSIS — C3491 Malignant neoplasm of unspecified part of right bronchus or lung: Secondary | ICD-10-CM

## 2016-07-20 ENCOUNTER — Telehealth: Payer: Self-pay

## 2016-07-20 NOTE — Telephone Encounter (Signed)
Pt called to clarify when his next appt were. Done.

## 2016-07-25 ENCOUNTER — Other Ambulatory Visit (HOSPITAL_BASED_OUTPATIENT_CLINIC_OR_DEPARTMENT_OTHER): Payer: Medicare Other

## 2016-07-25 DIAGNOSIS — C3411 Malignant neoplasm of upper lobe, right bronchus or lung: Secondary | ICD-10-CM

## 2016-07-25 DIAGNOSIS — C3491 Malignant neoplasm of unspecified part of right bronchus or lung: Secondary | ICD-10-CM

## 2016-07-25 LAB — CBC WITH DIFFERENTIAL/PLATELET
BASO%: 1 % (ref 0.0–2.0)
BASOS ABS: 0.1 10*3/uL (ref 0.0–0.1)
EOS ABS: 0.2 10*3/uL (ref 0.0–0.5)
EOS%: 3.3 % (ref 0.0–7.0)
HCT: 37.8 % — ABNORMAL LOW (ref 38.4–49.9)
HGB: 12.2 g/dL — ABNORMAL LOW (ref 13.0–17.1)
LYMPH%: 19.8 % (ref 14.0–49.0)
MCH: 31.4 pg (ref 27.2–33.4)
MCHC: 32.3 g/dL (ref 32.0–36.0)
MCV: 97.4 fL (ref 79.3–98.0)
MONO#: 1.3 10*3/uL — AB (ref 0.1–0.9)
MONO%: 19.9 % — ABNORMAL HIGH (ref 0.0–14.0)
NEUT%: 56 % (ref 39.0–75.0)
NEUTROS ABS: 3.8 10*3/uL (ref 1.5–6.5)
PLATELETS: 251 10*3/uL (ref 140–400)
RBC: 3.88 10*6/uL — AB (ref 4.20–5.82)
RDW: 17.1 % — ABNORMAL HIGH (ref 11.0–14.6)
WBC: 6.8 10*3/uL (ref 4.0–10.3)
lymph#: 1.3 10*3/uL (ref 0.9–3.3)

## 2016-07-25 LAB — COMPREHENSIVE METABOLIC PANEL
ALBUMIN: 3.6 g/dL (ref 3.5–5.0)
ALK PHOS: 92 U/L (ref 40–150)
ALT: 14 U/L (ref 0–55)
ANION GAP: 8 meq/L (ref 3–11)
AST: 18 U/L (ref 5–34)
BUN: 15.2 mg/dL (ref 7.0–26.0)
CO2: 32 meq/L — AB (ref 22–29)
Calcium: 9.3 mg/dL (ref 8.4–10.4)
Chloride: 100 mEq/L (ref 98–109)
Creatinine: 0.8 mg/dL (ref 0.7–1.3)
Glucose: 114 mg/dl (ref 70–140)
POTASSIUM: 3.9 meq/L (ref 3.5–5.1)
Sodium: 140 mEq/L (ref 136–145)
TOTAL PROTEIN: 7.5 g/dL (ref 6.4–8.3)

## 2016-08-01 ENCOUNTER — Encounter (HOSPITAL_COMMUNITY): Payer: Self-pay

## 2016-08-01 ENCOUNTER — Telehealth: Payer: Self-pay | Admitting: Internal Medicine

## 2016-08-01 ENCOUNTER — Encounter: Payer: Self-pay | Admitting: Internal Medicine

## 2016-08-01 ENCOUNTER — Ambulatory Visit (HOSPITAL_COMMUNITY)
Admission: RE | Admit: 2016-08-01 | Discharge: 2016-08-01 | Disposition: A | Discharge status: Home / Self Care | Payer: Medicare Other | Source: Ambulatory Visit | Attending: Internal Medicine | Admitting: Internal Medicine

## 2016-08-01 ENCOUNTER — Ambulatory Visit (HOSPITAL_BASED_OUTPATIENT_CLINIC_OR_DEPARTMENT_OTHER): Payer: BC Managed Care – PPO | Admitting: Internal Medicine

## 2016-08-01 VITALS — BP 143/63 | HR 96 | Temp 97.9°F | Resp 18 | Ht 73.0 in | Wt 179.4 lb

## 2016-08-01 DIAGNOSIS — C3411 Malignant neoplasm of upper lobe, right bronchus or lung: Secondary | ICD-10-CM | POA: Diagnosis not present

## 2016-08-01 DIAGNOSIS — C3490 Malignant neoplasm of unspecified part of unspecified bronchus or lung: Secondary | ICD-10-CM | POA: Diagnosis not present

## 2016-08-01 DIAGNOSIS — C3491 Malignant neoplasm of unspecified part of right bronchus or lung: Secondary | ICD-10-CM

## 2016-08-01 DIAGNOSIS — I6523 Occlusion and stenosis of bilateral carotid arteries: Secondary | ICD-10-CM | POA: Diagnosis not present

## 2016-08-01 DIAGNOSIS — R911 Solitary pulmonary nodule: Secondary | ICD-10-CM | POA: Diagnosis not present

## 2016-08-01 MED ORDER — IOPAMIDOL (ISOVUE-300) INJECTION 61%
75.0000 mL | Freq: Once | INTRAVENOUS | Status: AC | PRN
  Administered 2016-08-01: 75 mL via INTRAVENOUS

## 2016-08-01 NOTE — Progress Notes (Signed)
[facility] Telephone:([phone]) [phone]   [phone]  OFFICE PROGRESS NOTE  Renato Shin, MD [address] [place] [address]  DIAGNOSIS: Unresectable Stage IIA (T1b, N1, M0) non-small cell lung cancer, squamous cell carcinoma presented with right upper lobe lung nodule and hilar lymphadenopathy diagnosed in January 2017.  PRIOR THERAPY: Course of concurrent chemoradiation with weekly carboplatin for AUC of 2 and paclitaxel 45 MG/M2. Status post 7 cycles, last dose was given 03/14/2016 with partial response.  CURRENT THERAPY: Observation.  INTERVAL HISTORY: Ryan Contreras 75 y.o. male returns to the clinic today for follow-up visit accompanied by his sister. The patient has no complaints today except for the shortness of breath with exertion. He denied having any significant chest pain, cough or hemoptysis. He denied having any significant fever or chills. He has no nausea or vomiting. No significant weight loss or night sweats. He had repeat CT scan of the chest performed earlier today and he is here for evaluation and discussion of his scan results.  MEDICAL HISTORY: Past Medical History:  Diagnosis Date  . CAD, NATIVE VESSEL    coronary stent x1-tx. Plavix use.  Marland Kitchen COPD (chronic obstructive pulmonary disease) ([place])    due to 50+ years Smoking.  . Gastrointestinal bleeding, upper 04/29/2016  . Gout, unspecified    foot  . HYPERLIPIDEMIA   . HYPERTENSION   . PEPTIC ULCER DISEASE   . TOBACCO ABUSE   . Type 2 diabetes mellitus ([place])    denies on 01-06-16    ALLERGIES:  has No Known Allergies.  MEDICATIONS:  Current Outpatient Prescriptions  Medication Sig Dispense Refill  . allopurinol (ZYLOPRIM) 100 MG tablet Take 100 mg by mouth daily.    Marland Kitchen aspirin 81 MG tablet Take 81 mg by mouth at bedtime. Reported on 05/02/2016    . atorvastatin (LIPITOR) 80 MG tablet TAKE 1 TABLET (80 MG TOTAL) BY MOUTH DAILY. 30 tablet 6  . clopidogrel (PLAVIX)  75 MG tablet Take 1 tablet (75 mg total) by mouth daily. Please call and schedule a one year follow up appointment 90 tablet 0  . acetaminophen (TYLENOL) 500 MG tablet Take 250 mg by mouth every 6 (six) hours as needed for moderate pain. Reported on 04/29/2016    . acetaZOLAMIDE (DIAMOX) 250 MG tablet Take 1 tablet (250 mg total) by mouth daily. 30 tablet 2  . ferrous sulfate 325 (65 FE) MG tablet Take 325 mg by mouth daily with breakfast.    . KLOR-CON M10 10 MEQ tablet Take 10 mEq by mouth daily.     Marland Kitchen losartan-hydrochlorothiazide (HYZAAR) 100-12.5 MG tablet Take 1 tablet by mouth daily. 90 tablet 3  . mometasone-formoterol (DULERA) 200-5 MCG/ACT AERO Inhale 2 puffs into the lungs 2 (two) times daily. 1 Inhaler 6  . pantoprazole (PROTONIX) 40 MG tablet Take 1 tablet (40 mg total) by mouth daily. 90 tablet 3  . ranitidine (ZANTAC) 150 MG tablet Take 150 mg by mouth daily.  1  . senna-docusate (SENOKOT-S) 8.6-50 MG tablet Take 1 tablet by mouth at bedtime.     . sildenafil (VIAGRA) 100 MG tablet Take 1 tablet (100 mg total) by mouth as needed for erectile dysfunction. 1 tablet 4   No current facility-administered medications for this visit.     SURGICAL HISTORY:  Past Surgical History:  Procedure Laterality Date  . CARDIAC CATHETERIZATION     '06-stent placed, 6'13 - Dr. Lizbeth Bark follows  .  CORONARY STENT PLACEMENT     s/p  . ENDOBRONCHIAL ULTRASOUND Bilateral 01/11/2016   Procedure: ENDOBRONCHIAL ULTRASOUND;  Surgeon: Collene Gobble, MD;  Location: [facility];  Service: Endoscopy;  Laterality: Bilateral;    REVIEW OF SYSTEMS:  A comprehensive review of systems was negative except for: Respiratory: positive for dyspnea on exertion   PHYSICAL EXAMINATION: General appearance: alert, cooperative, fatigued and no distress Head: Normocephalic, without obvious abnormality, atraumatic Neck: no adenopathy, no JVD, supple, symmetrical, trachea midline and thyroid not enlarged, symmetric, no  tenderness/mass/nodules Lymph nodes: Cervical, supraclavicular, and axillary nodes normal. Resp: clear to auscultation bilaterally Back: symmetric, no curvature. ROM normal. No CVA tenderness. Cardio: regular rate and rhythm, S1, S2 normal, no murmur, click, rub or gallop GI: soft, non-tender; bowel sounds normal; no masses,  no organomegaly Extremities: extremities normal, atraumatic, no cyanosis or edema Neurologic: Alert and oriented X 3, normal strength and tone. Normal symmetric reflexes. Normal coordination and gait  ECOG PERFORMANCE STATUS: 1 - Symptomatic but completely ambulatory  Blood pressure (!) 143/63, pulse 96, temperature 97.9 F (36.6 C), temperature source Oral, resp. rate 18, height '6\' 1"'$  (1.854 m), weight 179 lb 6.4 oz (81.4 kg), SpO2 94 %.  LABORATORY DATA: Lab Results  Component Value Date   WBC 6.8 07/25/2016   HGB 12.2 (L) 07/25/2016   HCT 37.8 (L) 07/25/2016   MCV 97.4 07/25/2016   PLT 251 07/25/2016      Chemistry      Component Value Date/Time   NA 140 07/25/2016 0931   K 3.9 07/25/2016 0931   CL 95 (L) 09/11/2015 0911   CO2 32 (H) 07/25/2016 0931   BUN 15.2 07/25/2016 0931   CREATININE 0.8 07/25/2016 0931      Component Value Date/Time   CALCIUM 9.3 07/25/2016 0931   ALKPHOS 92 07/25/2016 0931   AST 18 07/25/2016 0931   ALT 14 07/25/2016 0931   BILITOT <0.30 07/25/2016 0931       RADIOGRAPHIC STUDIES: Ct Chest W Contrast  Result Date: 08/01/2016 CLINICAL DATA:  Lung cancer restaging. EXAM: CT CHEST WITH CONTRAST TECHNIQUE: Multidetector CT imaging of the chest was performed during intravenous contrast administration. CONTRAST:  4m ISOVUE-300 IOPAMIDOL (ISOVUE-300) INJECTION 61% COMPARISON:  None. FINDINGS: Cardiovascular: Coronary artery calcification and aortic atherosclerotic calcification. Mediastinum/Nodes: No axillary or supraclavicular adenopathy. No mediastinal hilar adenopathy. Subcutaneous low-density round lesion in the chest wall  superficial to the sternum is stable. Lungs/Pleura: RIGHT upper lobe central perihilar lesion measuring 18 x 9 mm (image 66, series 5) compares to 21 by 11 mm for mild contraction. Interval decrease in peripheral branching consolidated pattern in the RIGHT upper lobe with a single central nodule remaining measuring 12 mm on image 63, series 5. LEFT lower lobe nodule measuring 12 mm by 10 mm compares to 11 mm by 9 mm (remeasured) for no significant overall change (image 75, series 5.) No new pulmonary nodularity. Upper Abdomen: Oblong soft tissue posterior RIGHT hepatic lobe measuring 3.9 x 1.9 cm unchanged. Liver has a nodular contour. LEFT adrenal nodule unchanged. No upper abdominal adenopathy. Musculoskeletal: No aggressive osseous lesion. Sclerotic lesion in the T 6 2 body is not changed IMPRESSION: 1. Interval contraction of RIGHT upper lobe perihilar lesion. This lesion was hypermetabolic on comparison FDG PET scan. 2. Decreasing consolidative parenchymal disease in the RIGHT upper lobe with a single residual nodule. Recommend attention on follow-up. 3. Stable LEFT lower lobe pulmonary nodule. Electronically Signed   By: SHelane GuntherD.  On: 08/01/2016 09:48    ASSESSMENT AND PLAN: This is a very pleasant 75 years old African-American male with unresectable stage II a non-small cell lung cancer, squamous cell carcinoma status post a course of concurrent chemoradiation with weekly carboplatin and paclitaxel.Status post 7 cycles. The patient tolerated his treatment fairly well with no significant adverse effects. The CT scan of the chest performed earlier today showed no evidence for disease progression. I discussed the scan results with the patient and his sister. I recommended for him to continue on observation with repeat CT scan of the chest in 4 months. He was advised to call immediately if he has any concerning symptoms in the interval. The patient voices understanding of current disease  status and treatment options and is in agreement with the current care plan.  All questions were answered. The patient knows to call the clinic with any problems, questions or concerns. We can certainly see the patient much sooner if necessary.  Disclaimer: This note was dictated with voice recognition software. Similar sounding words can inadvertently be transcribed and may not be corrected upon review.

## 2016-08-01 NOTE — Patient Instructions (Signed)
Smoking Cessation, Tips for Success If you are ready to quit smoking, congratulations! You have chosen to help yourself be healthier. Cigarettes bring nicotine, tar, carbon monoxide, and other irritants into your body. Your lungs, heart, and blood vessels will be able to work better without these poisons. There are many different ways to quit smoking. Nicotine gum, nicotine patches, a nicotine inhaler, or nicotine nasal spray can help with physical craving. Hypnosis, support groups, and medicines help break the habit of smoking. WHAT THINGS CAN I DO TO MAKE QUITTING EASIER?  Here are some tips to help you quit for good:  Pick a date when you will quit smoking completely. Tell all of your friends and family about your plan to quit on that date.  Do not try to slowly cut down on the number of cigarettes you are smoking. Pick a quit date and quit smoking completely starting on that day.  Throw away all cigarettes.   Clean and remove all ashtrays from your home, work, and car.  On a card, write down your reasons for quitting. Carry the card with you and read it when you get the urge to smoke.  Cleanse your body of nicotine. Drink enough water and fluids to keep your urine clear or pale yellow. Do this after quitting to flush the nicotine from your body.  Learn to predict your moods. Do not let a bad situation be your excuse to have a cigarette. Some situations in your life might tempt you into wanting a cigarette.  Never have "just one" cigarette. It leads to wanting another and another. Remind yourself of your decision to quit.  Change habits associated with smoking. If you smoked while driving or when feeling stressed, try other activities to replace smoking. Stand up when drinking your coffee. Brush your teeth after eating. Sit in a different chair when you read the paper. Avoid alcohol while trying to quit, and try to drink fewer caffeinated beverages. Alcohol and caffeine may urge you to  smoke.  Avoid foods and drinks that can trigger a desire to smoke, such as sugary or spicy foods and alcohol.  Ask people who smoke not to smoke around you.  Have something planned to do right after eating or having a cup of coffee. For example, plan to take a walk or exercise.  Try a relaxation exercise to calm you down and decrease your stress. Remember, you may be tense and nervous for the first 2 weeks after you quit, but this will pass.  Find new activities to keep your hands busy. Play with a pen, coin, or rubber band. Doodle or draw things on paper.  Brush your teeth right after eating. This will help cut down on the craving for the taste of tobacco after meals. You can also try mouthwash.   Use oral substitutes in place of cigarettes. Try using lemon drops, carrots, cinnamon sticks, or chewing gum. Keep them handy so they are available when you have the urge to smoke.  When you have the urge to smoke, try deep breathing.  Designate your home as a nonsmoking area.  If you are a heavy smoker, ask your health care provider about a prescription for nicotine chewing gum. It can ease your withdrawal from nicotine.  Reward yourself. Set aside the cigarette money you save and buy yourself something nice.  Look for support from others. Join a support group or smoking cessation program. Ask someone at home or at work to help you with your plan   to quit smoking.  Always ask yourself, "Do I need this cigarette or is this just a reflex?" Tell yourself, "Today, I choose not to smoke," or "I do not want to smoke." You are reminding yourself of your decision to quit.  Do not replace cigarette smoking with electronic cigarettes (commonly called e-cigarettes). The safety of e-cigarettes is unknown, and some may contain harmful chemicals.  If you relapse, do not give up! Plan ahead and think about what you will do the next time you get the urge to smoke. HOW WILL I FEEL WHEN I QUIT SMOKING? You  may have symptoms of withdrawal because your body is used to nicotine (the addictive substance in cigarettes). You may crave cigarettes, be irritable, feel very hungry, cough often, get headaches, or have difficulty concentrating. The withdrawal symptoms are only temporary. They are strongest when you first quit but will go away within 10-14 days. When withdrawal symptoms occur, stay in control. Think about your reasons for quitting. Remind yourself that these are signs that your body is healing and getting used to being without cigarettes. Remember that withdrawal symptoms are easier to treat than the major diseases that smoking can cause.  Even after the withdrawal is over, expect periodic urges to smoke. However, these cravings are generally short lived and will go away whether you smoke or not. Do not smoke! WHAT RESOURCES ARE AVAILABLE TO HELP ME QUIT SMOKING? Your health care provider can direct you to community resources or hospitals for support, which may include:  Group support.  Education.  Hypnosis.  Therapy.   This information is not intended to replace advice given to you by your health care provider. Make sure you discuss any questions you have with your health care provider.   Document Released: 08/26/2004 Document Revised: 12/19/2014 Document Reviewed: 05/16/2013 Elsevier Interactive Patient Education 2016 Elsevier Inc.  

## 2016-08-01 NOTE — Telephone Encounter (Signed)
GAVE PATIENT AVS REPORT AND APPOINTMENTS FOR December AND January. FOLLOW UP APPOINTMENT SCHEDULED FOR 12/19/16 PER PATIENT REQUEST. [facility] SCHEDULING WILL CALL RE CT - PATIENT AWARE.

## 2016-08-23 ENCOUNTER — Telehealth: Payer: Self-pay | Admitting: Endocrinology

## 2016-08-23 NOTE — Telephone Encounter (Signed)
please call patient: Ov is needed to complete dmv form. Please move up ov to next available.

## 2016-08-23 NOTE — Telephone Encounter (Signed)
Patient contacted and scheduled for 08/25/2016.

## 2016-08-25 ENCOUNTER — Other Ambulatory Visit (INDEPENDENT_AMBULATORY_CARE_PROVIDER_SITE_OTHER): Payer: Medicare Other

## 2016-08-25 ENCOUNTER — Ambulatory Visit (INDEPENDENT_AMBULATORY_CARE_PROVIDER_SITE_OTHER): Payer: Medicare Other | Admitting: Endocrinology

## 2016-08-25 ENCOUNTER — Other Ambulatory Visit: Payer: Medicare Other

## 2016-08-25 ENCOUNTER — Encounter: Payer: Self-pay | Admitting: Endocrinology

## 2016-08-25 VITALS — BP 122/62 | HR 106 | Ht 73.0 in | Wt 183.0 lb

## 2016-08-25 DIAGNOSIS — E876 Hypokalemia: Secondary | ICD-10-CM

## 2016-08-25 DIAGNOSIS — D509 Iron deficiency anemia, unspecified: Secondary | ICD-10-CM

## 2016-08-25 DIAGNOSIS — E1159 Type 2 diabetes mellitus with other circulatory complications: Secondary | ICD-10-CM

## 2016-08-25 DIAGNOSIS — I6523 Occlusion and stenosis of bilateral carotid arteries: Secondary | ICD-10-CM

## 2016-08-25 LAB — CBC WITH DIFFERENTIAL/PLATELET
BASOS ABS: 0 10*3/uL (ref 0.0–0.1)
Basophils Relative: 0.6 % (ref 0.0–3.0)
EOS ABS: 0.2 10*3/uL (ref 0.0–0.7)
Eosinophils Relative: 1.8 % (ref 0.0–5.0)
HEMATOCRIT: 37.4 % — AB (ref 39.0–52.0)
HEMOGLOBIN: 12.4 g/dL — AB (ref 13.0–17.0)
LYMPHS PCT: 19.2 % (ref 12.0–46.0)
Lymphs Abs: 1.7 10*3/uL (ref 0.7–4.0)
MCHC: 33.2 g/dL (ref 30.0–36.0)
MCV: 94.6 fl (ref 78.0–100.0)
MONOS PCT: 17 % — AB (ref 3.0–12.0)
Monocytes Absolute: 1.5 10*3/uL — ABNORMAL HIGH (ref 0.1–1.0)
Neutro Abs: 5.3 10*3/uL (ref 1.4–7.7)
Neutrophils Relative %: 61.4 % (ref 43.0–77.0)
Platelets: 270 10*3/uL (ref 150.0–400.0)
RBC: 3.96 Mil/uL — AB (ref 4.22–5.81)
RDW: 17.1 % — ABNORMAL HIGH (ref 11.5–15.5)
WBC: 8.6 10*3/uL (ref 4.0–10.5)

## 2016-08-25 LAB — BASIC METABOLIC PANEL
BUN: 15 mg/dL (ref 6–23)
CHLORIDE: 100 meq/L (ref 96–112)
CO2: 35 mEq/L — ABNORMAL HIGH (ref 19–32)
Calcium: 9 mg/dL (ref 8.4–10.5)
Creatinine, Ser: 0.8 mg/dL (ref 0.40–1.50)
GFR: 121.95 mL/min (ref >60.00)
Glucose, Bld: 91 mg/dL (ref 70–99)
POTASSIUM: 4 meq/L (ref 3.5–5.1)
SODIUM: 139 meq/L (ref 135–145)

## 2016-08-25 LAB — IBC PANEL
Iron: 94 ug/dL (ref 42–165)
SATURATION RATIOS: 26.4 % (ref 20.0–50.0)
TRANSFERRIN: 254 mg/dL (ref 212.0–360.0)

## 2016-08-25 LAB — HEMOGLOBIN A1C: HEMOGLOBIN A1C: 6.7 % — AB (ref 4.6–6.5)

## 2016-08-25 NOTE — Progress Notes (Signed)
Subjective:    Patient ID: Ryan Contreras, male    DOB: 23-Nov-1941, 75 y.o.   MRN: [id number]  HPI  The state of at least three ongoing medical problems is addressed today, with interval history of each noted here: Pt returns for f/u of diabetes mellitus: DM type: 2 Dx'ed: 2694 Complications: CAD Therapy: none now DKA: never Severe hypoglycemia: never Pancreatitis: never Other: he has never been on insulin Interval history: He has regained 22 lbs. Anemia: he denies BRBPR.  He takes fe 1/day Hypokalemia: he denies cramps Past Medical History:  Diagnosis Date  . CAD, NATIVE VESSEL    coronary stent x1-tx. Plavix use.  Marland Kitchen COPD (chronic obstructive pulmonary disease) ([place])    due to 50+ years Smoking.  . Gastrointestinal bleeding, upper 04/29/2016  . Gout, unspecified    foot  . HYPERLIPIDEMIA   . HYPERTENSION   . PEPTIC ULCER DISEASE   . TOBACCO ABUSE   . Type 2 diabetes mellitus ([place])    denies on 01-06-16    Past Surgical History:  Procedure Laterality Date  . CARDIAC CATHETERIZATION     '06-stent placed, 6'13 - Dr. Lizbeth Bark follows  . CORONARY STENT PLACEMENT     s/p  . ENDOBRONCHIAL ULTRASOUND Bilateral 01/11/2016   Procedure: ENDOBRONCHIAL ULTRASOUND;  Surgeon: Collene Gobble, MD;  Location: [facility];  Service: Endoscopy;  Laterality: Bilateral;    Social History   Social History  . Marital status: Divorced    Spouse name: N/A  . Number of children: 2  . Years of education: N/A   Occupational History  . 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 Smoking status: Current Every Day Smoker    Packs/day: 0.50    Years: 50.00    Types: Cigarettes  . Smokeless tobacco: Never Used     Comment: currently smoking 1cig/day (05/23/16)  . Alcohol use 0.0 oz/week     Comment: 1 beer/day  . Drug use: No  . Sexual activity: Not on file   Other Topics Concern  . Not on file   Social History Narrative   Widowed 2003    Current Outpatient  Prescriptions on File Prior to Visit  Medication Sig Dispense Refill  . acetaminophen (TYLENOL) 500 MG tablet Take 250 mg by mouth every 6 (six) hours as needed for moderate pain. Reported on 04/29/2016    . acetaZOLAMIDE (DIAMOX) 250 MG tablet Take 1 tablet (250 mg total) by mouth daily. 30 tablet 2  . allopurinol (ZYLOPRIM) 100 MG tablet Take 100 mg by mouth daily.    Marland Kitchen aspirin 81 MG tablet Take 81 mg by mouth at bedtime. Reported on 05/02/2016    . atorvastatin (LIPITOR) 80 MG tablet TAKE 1 TABLET (80 MG TOTAL) BY MOUTH DAILY. 30 tablet 6  . clopidogrel (PLAVIX) 75 MG tablet Take 1 tablet (75 mg total) by mouth daily. Please call and schedule a one year follow up appointment 90 tablet 0  . ferrous sulfate 325 (65 FE) MG tablet Take 325 mg by mouth daily with breakfast.    . KLOR-CON M10 10 MEQ tablet Take 10 mEq by mouth daily.     Marland Kitchen losartan-hydrochlorothiazide (HYZAAR) 100-12.5 MG tablet Take 1 tablet by mouth daily. 90 tablet 3  . mometasone-formoterol (DULERA) 200-5 MCG/ACT AERO Inhale 2 puffs into the lungs 2 (two) times daily. 1 Inhaler 6  . pantoprazole (PROTONIX) 40 MG tablet Take 1 tablet (40 mg total) by mouth daily. 90 tablet  3  . ranitidine (ZANTAC) 150 MG tablet Take 150 mg by mouth daily.  1  . senna-docusate (SENOKOT-S) 8.6-50 MG tablet Take 1 tablet by mouth at bedtime.     . sildenafil (VIAGRA) 100 MG tablet Take 1 tablet (100 mg total) by mouth as needed for erectile dysfunction. 1 tablet 4   No current facility-administered medications on file prior to visit.     No Known Allergies  Family History  Problem Relation Age of Onset  . Cancer Sister     Uncertain type  . Cancer Brother     Uncertain type  . Arthritis Mother   . Hypertension Father   . Heart attack Father     BP 122/62   Pulse (!) 106   Ht '6\' 1"'$  (1.854 m)   Wt 183 lb (83 kg)   SpO2 93%   BMI 24.14 kg/m    Review of Systems He denies hematuria, numbness, and LOC    Objective:   Physical  Exam VITAL SIGNS:  See vs page GENERAL: no distress Pulses: dorsalis pedis intact bilat.   MSK: no deformity of the feet CV: 1+ bilat leg edema Skin:  no ulcer on the feet.  normal color and temp on the feet. Neuro: sensation is intact to touch on the feet Ext: There is bilateral onychomycosis of the toenails.    Lab Results  Component Value Date   CREATININE 0.80 08/25/2016   BUN 15 08/25/2016   NA 139 08/25/2016   K 4.0 08/25/2016   CL 100 08/25/2016   CO2 35 (H) 08/25/2016   Lab Results  Component Value Date   WBC 8.6 08/25/2016   HGB 12.4 (L) 08/25/2016   HCT 37.4 (L) 08/25/2016   MCV 94.6 08/25/2016   PLT 270.0 08/25/2016   Lab Results  Component Value Date   HGBA1C 6.7 (H) 08/25/2016      Assessment & Plan:  Type 2 DM: worse: well-controlled for now, but he'll need medication soon Anemia: persistent: same fe tabs Hypokalemia: well-controlled: Please continue the same medication

## 2016-08-25 NOTE — Patient Instructions (Addendum)
Please continue the same medications. I'll do the driver's license form.  blood tests are requested for you today.  We'll let you know about the results.  Please come back for a follow-up appointment in 4 months.

## 2016-09-02 ENCOUNTER — Ambulatory Visit: Payer: BC Managed Care – PPO | Admitting: Endocrinology

## 2016-09-16 ENCOUNTER — Other Ambulatory Visit: Payer: Self-pay | Admitting: Internal Medicine

## 2016-09-30 DIAGNOSIS — Z23 Encounter for immunization: Secondary | ICD-10-CM | POA: Diagnosis not present

## 2016-11-02 ENCOUNTER — Telehealth: Payer: Self-pay

## 2016-11-02 NOTE — Telephone Encounter (Signed)
Pt called to verify his appts. Done and pt spoke back.

## 2016-11-22 ENCOUNTER — Ambulatory Visit (INDEPENDENT_AMBULATORY_CARE_PROVIDER_SITE_OTHER): Payer: Medicare Other | Admitting: Pulmonary Disease

## 2016-11-22 ENCOUNTER — Ambulatory Visit (INDEPENDENT_AMBULATORY_CARE_PROVIDER_SITE_OTHER)
Admission: RE | Admit: 2016-11-22 | Discharge: 2016-11-22 | Disposition: A | Discharge status: Home / Self Care | Payer: Medicare Other | Source: Ambulatory Visit | Attending: Pulmonary Disease | Admitting: Pulmonary Disease

## 2016-11-22 ENCOUNTER — Encounter: Payer: Self-pay | Admitting: Pulmonary Disease

## 2016-11-22 VITALS — BP 110/62 | HR 94 | Temp 97.7°F | Ht 73.0 in | Wt 196.1 lb

## 2016-11-22 DIAGNOSIS — J9692 Respiratory failure, unspecified with hypercapnia: Secondary | ICD-10-CM | POA: Insufficient documentation

## 2016-11-22 DIAGNOSIS — I10 Essential (primary) hypertension: Secondary | ICD-10-CM

## 2016-11-22 DIAGNOSIS — F17210 Nicotine dependence, cigarettes, uncomplicated: Secondary | ICD-10-CM

## 2016-11-22 DIAGNOSIS — J9612 Chronic respiratory failure with hypercapnia: Secondary | ICD-10-CM | POA: Diagnosis not present

## 2016-11-22 DIAGNOSIS — J449 Chronic obstructive pulmonary disease, unspecified: Secondary | ICD-10-CM | POA: Diagnosis not present

## 2016-11-22 DIAGNOSIS — I6523 Occlusion and stenosis of bilateral carotid arteries: Secondary | ICD-10-CM

## 2016-11-22 DIAGNOSIS — I2510 Atherosclerotic heart disease of native coronary artery without angina pectoris: Secondary | ICD-10-CM | POA: Diagnosis not present

## 2016-11-22 DIAGNOSIS — C3491 Malignant neoplasm of unspecified part of right bronchus or lung: Secondary | ICD-10-CM | POA: Diagnosis not present

## 2016-11-22 MED ORDER — TIOTROPIUM BROMIDE MONOHYDRATE 18 MCG IN CAPS
18.0000 ug | ORAL_CAPSULE | Freq: Every day | RESPIRATORY_TRACT | 6 refills | Status: DC
Start: 2016-11-22 — End: 2017-05-24

## 2016-11-22 NOTE — Progress Notes (Signed)
Subjective:     Patient ID: Ryan Contreras, male   DOB: July 22, 1941, 75 y.o.   MRN: [id number]  HPI  ~  10/27/14:  77moROV w/ KC>        The patient comes in today for follow-up of his known severe COPD. He is staying on Symbicort compliantly, but has not been able to afford Spiriva in the past. Unfortunately, he continues to smoke, and really has no intention to quit. I have talked with him further about this today. He has not had an acute exacerbation or pulmonary infection since last visit. He has mild cough with only white sputum production.      REC>  The patient has been stable since the last visit on his current bronchodilator regimen, but unfortunately continues to smoke. I have had a long discussion with him again about the importance of total smoking cessation. I've also encouraged him to work aggressively on some type of conditioning program.   - Continue on Symbicort 2 puffs am and pm everyday. Rinse mouth well. - Keep your albuterol handy for rescue if needed. - Stop smoking.This is the most important part of your treatment. - Followup with me again in 649mo ~  04/27/15:  54m29moV w/ KC>        Patient comes in today for follow-up of his known COPD. He is staying on his bronchodilator regimen, but unfortunately continues to smoke. He feels that his exertional tolerance is at baseline, and has not had an acute exacerbation since the last visit. He does continue to have cough and nonpurulent mucus, but I have explained this is directly related to his smoking.      REC>  The patient appears to be at his usual baseline from the last visit, and denies any recent acute exacerbation. He is staying on Symbicort, as well as as needed albuterol. Unfortunately, he continues to smoke, and I have stressed to him the importance of total smoking cessation. I have asked him to stay as active as possible, and to follow-up again in 6 months. - Stay on symbicort everyday - Work on some type of exercise -  Try to quit smoking.This is the most important treatment for you - Followup with Dr. 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   He started smoking in his teens, he has been smoking for 55 yrs up to 1ppd, and decr to 1/2ppd recently...      EXAM shows Afeb, VSS, O2sat=93% on RA at rest;  HEENT- neg, Mallampati4;  Chest- decr BS bilat & few scat rhonchi, no w/r;  Heart- RR w/o m/r/g heard;  Abd- soft, neg;  Ext- VI, no c/c/e;  Neuro- no focal deficits...   PFT 05/20/11 showed FVC=3.52 (72%), FEV1=1.52 (46%), %1sec=43, mid-flows reduced at 19% predicted; post bronchodil there was a 7% improvement in FEV1;  TLC=6.64 (91%), RV=3.04 (113%), RV/TLC=46;  DLCO=80% predicted.  CXR 04/13/15 showed norm heart size, COPD, mild right base Atx, NAD...   LABS 9-09/2015:  Chems- ok x HCO3=42  indicative of CO2 retention & renal compensation (he may benefit from Diamox);  CBC- wnl;  TSH=0.74;  BNP=216...  NOTE> he is on Hyzaar 100-25 AND Lasix20... IMP/PLAN>>  Ryan Contreras desperately needs to quit smoking- discussed Nicotine replacement options and offered Chantix;  Breathing meds refilled;  I rec that he STOP the Lasix for now & substitute ACETAZOLAMIDE 223m one tab daily in the afternoon;  He has already had the 2016 Flu vaccine;  Plan ROV in 693moooner if needed prn...  ADDENDUM>>  Ryan Contreras had a Low-dose Screening Chest CT 11/26/15- norm heart size, atherosclerosis of Ao & all 3 coronaries, stent in RCA, right hilar fullness c/w adenopathy, 2cm medial RUL nodule, ?medial aspect of left superior segment?, mild centrilobular & paraseptal  emphysema... ADDENDUM>>  PET scan was performed 12/11/15- 2.7 x 2.0 spiculated nodule in medial RUL is hypermetabolic w/ hypermet lymphadenopathy in the right hilum, no other hypermet lymphadenopathy; 1157mubsolid nodule in medial LLL shows very low grade metabolic activ (SUVFYBOFB=5.1no hypermet activ in Abd x enlarged prostate w/ TURP defect... ADDENDUM>>  He had Bronch w/ EBUS 01/11/16 by [name] w/ path c/w a non-small cell lung cancer (favored to be a squamous cell lesion); subsequent MRI Brain 02/04/16 was neg for signs of metastatic dis & MTOC eval & lesion felt to be unresectable Stage IIa non-small cell Ca; seen by [name] & [name] w/ combined weekly ChemoRadiation w/ carboplatin & paclitaxel x 7cycles last 03/14/16, and [place] but there are no notes in EPIC regarding his XRT treatment...  ADDENDUM>>  Follow up medical oncology appt w/ [name] 04/29/16 w/ CT Chest done 5/17- irreg mass in medial RUL is smaller than prev & assoc peripheral reticular changes likely from his XRT, LLL central 9mm92ml-defined nodule is unchanged & other comparators likewise unchanged... [name] offered continued observation vs consolidative chemotherapy but his performance status was poor so they decided on f/u CT in 3 months...  ~  May 23, 2016:  68mo 20mow/ SN>  Ryan Contreras diagnosed & treated for a non-small cell lung cancer in his right hilum- see above;  He also developed abd pain/ GIB & was [facility] - 04[facility] w/ Hg=4.3, required 5u transfusion & eval by GI- acute & chronic gastritis/ duodenitis & ?mass in duodenum but Bx was neg x inflamm (Bx also neg for HPylori but his HPylori Ab test was pos);  He was treated w/ triple therapy & improved, they made the decision to restart his ASA/Plavix at disch due to his CAD & stent; Hg improved to the 9-10 range and this is to be followed up by his PCP- DrEllPesotumeviewed records in Care Everywhere)...      Ryan Contreras continues on his Dulera200- 2spBid but unfortunately is also still smoking despite all he's been through- he declines smoking cessation help, encouraged to try nicotine replacement rx or consider Chantix;  We reviewed the following medical problems during today's office visit >>     Severe airflow obstruction w/ GOLD Stage3 COPD>  He is on Dulera200-2spBid & ProairHFA prn; must quit smoking, stay active...     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    Lung Cancer>  diagnosed & treated 12/2015 for a non-small cell lung cancer in his right hilum; given  combined ChemoXRT by [name] & Wentworth=>DrPalmero in [place]; f/u CT Chest by [name] 04/2016=> he plans f/u CT 30mo     On-going tobacco abuse>  He continues to smoke ?several cigs/d; we discussed smoking cessation options...     Cardiac>  HBP, CAD (DrRoss) w/ RCA stent, extensive atherosclerosis in Ao etc; on ASA81, Plavix75 (restarted after 03/2016 GIB), Hyzaar100-12.5, K10.    GI>  [facility] 4/12 - 04/01/16 w/ Hg=4.3, required 5u transfusion & eval by GI- acute & chronic gastritis/ duodenitis & ?mass in duodenum but Bx was neg x inflamm (Bx also neg for HPylori but his HPylori Ab test was pos);  He was treated w/ triple therapy & improved, they made the decision to restart his ASA/Plavix at disch due to his CAD & stent; Hg improved to the 9-10 range and this is to be followed up by his PCP- DDonnelly(I reviewed records in Care Everywhere)...     Medical>  HL on Lip80, BPH w/ BOO, Elev PSA ([place]), Gout on Allopurinol300, & [facility]- gastitis, duodenitis, ?duodenal mass, +HPylori Ab test (neg Bx) and treated w/ triple therapy & improved (now taking Protonix40, Zantac150, Senakot-S); Anemia- on FeSO4...      EXAM shows Afeb, VSS, O2sat=97% on RA at rest;  HEENT-  neg, Mallampati4;  Chest- decr BS bilat & few scat rhonchi, no w/r;  Heart- RR gr1/6 SEM, w/o /r/g heard;  Abd- soft, nontender;  Ext- VI, no c/c/e;  Neuro- no focal deficits...   Imaging studies and [place] [facility] [place] IMP/PLAN>>  Problems as listed;  [place] needs to quit all smoking & grad increase his exercise program;  Continue Dulera200-2spBid & add MUCINEX 6054mtabs- 2Bid w/ fluids;  He will maintain f/u w/ his PCP, Oncologist, & GI;  We plan recheck in 73m732moooner if needed for any reason...   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 we reviewed the following medical problems during today's office visit >>     Severe airflow obstruction w/ GOLD Stage3 COPD>  He is on Dulera200-2spBid & ProairHFA prn; must quit smoking, stay active... C/o DOE w/ some ADLs, no change, not exercising, mild cough/ sput (clear, no bl)/ no CP/ etc.    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    Lung Cancer>  diagnosed & treated 12/2015 for a non-small cell lung cancer in right hilum; given combined ChemoXRT by [name] & Wentworth=>DrPalmero in [place]; f/u CT Chest scans by [name]- notes reviewed.    On-going tobacco abuse>  He  continues to smoke ?several cigs/d; we discussed smoking cessation options...     Cardiac>  HBP, CAD (DrRoss) w/ RCA stent, extensive atherosclerosis in  Ao etc; on ASA81, Plavix75 (restarted after 03/2016 GIB), Hyzaar100-12.5, K10.    GI>  [facility] 4/12 - 04/01/16 w/ Hg=4.3, required 5u transfusion & eval by GI- acute & chronic gastritis/ duodenitis & ?mass in duodenum but Bx was neg x inflamm (Bx also neg for HPylori but his HPylori Ab test was pos);  He was treated w/ triple therapy & improved, they made the decision to restart his ASA/Plavix at disch due to his CAD & stent; Hg improved to the 9-10 range and this is to be followed up by his PCP- [place] (I reviewed records in Care Everywhere)...     Medical>  HL on Lip80, BPH w/ BOO, Elev PSA ([place]), Gout on Allopurinol300, & ADM to [facility] 03/2016 w/ GIB- gastitis, duodenitis, ?duodenal mass, +HPylori Ab test (neg Bx) and treated w/ triple therapy & improved (now taking Protonix40, Zantac150, Senakot-S); Anemia- on FeSO4...      EXAM shows Afeb, VSS, O2sat=95% on RA at rest;  HEENT- neg, Mallampati4;  Chest- decr BS bilat & few scat rhonchi, no w/r;  Heart- RR gr1/6 SEM, w/o /r/g heard;  Abd- soft, nontender;  Ext- VI, no c/c/e;  Neuro- non-focal.  CXR 11/22/16>  Norm heart size, calcif in Ao, sl interstitial prom/ scarring in right mid-lung vicinity of prev XRT, COPD, vol loss right base...  CT Chest > pending per [name] IMP/PLAN>>  Ryan Contreras has severe chronic problems- followed regularly by [name], DrEllison Harriett Sine;  He would be better from the pulm standpoint if he'd quit all smoking & take his prescribed meds regularly (med non-compliance confirmed w/ call to his Pharm today); we added a LAMA to his regimen- SPIRIVA once daily; advised to take meds regularly, incr exercise program; we plan rov recheck in 87mo..    Past Medical History  Diagnosis Date   COPD w/ severe airflow obstruction 7 GOLD Stage 3 COPD> on Dulera200-2spBid    Lung  Cancer> right hilar mass found on low dose screening CT 11/2015, Bx= non small cell Ca (SCCa favored) & treated w/ chemoradiation completed 03/2016...   . CAD, NATIVE VESSEL >> followed by DrRoss, CATH 05/2012= 60%LAD, 40%D1, 40%Circ, 40%RCA, EF=55% w/ min inferobasal HK... He has Hx PTA/stent in RCA   . Gout, unspecified   . HYPERLIPIDEMIA   . HYPERTENSION   . PEPTIC ULCER DISEASE           [facility], Hg=4.3, required 5u Tx, eval w/ gastitis & duodenitis w/ ?duodenal mass- neg Bx (+HPylori Ab test); treated w/ triple therapy & improved...   . TOBACCO ABUSE   . Type 2 diabetes mellitus (HCC)    ANEMIA> acute GI blood loss anemia 03/2016 => on Fe     Past Surgical History:  Procedure Laterality Date  . CARDIAC CATHETERIZATION     '06-stent placed, 6'13 - Dr. PLizbeth Barkfollows  . CORONARY STENT PLACEMENT     s/p  . ENDOBRONCHIAL ULTRASOUND Bilateral 01/11/2016   Procedure: ENDOBRONCHIAL ULTRASOUND;  Surgeon: RCollene Gobble MD;  Location: [facility];  Service: Endoscopy;  Laterality: Bilateral;    Outpatient Encounter Prescriptions as of 11/22/2016  Medication Sig  . acetaminophen (TYLENOL) 500 MG tablet Take 250 mg by mouth every 6 (six) hours as needed for moderate pain. Reported on 04/29/2016  . acetaZOLAMIDE (DIAMOX) 250 MG tablet Take 1 tablet (250 mg total) by mouth daily.  .Marland Kitchenallopurinol (ZYLOPRIM) 100 MG tablet Take 100 mg by mouth daily.  .Marland Kitchenaspirin 81 MG  tablet Take 81 mg by mouth at bedtime. Reported on 05/02/2016  . atorvastatin (LIPITOR) 80 MG tablet TAKE 1 TABLET (80 MG TOTAL) BY MOUTH DAILY.  Marland Kitchen clopidogrel (PLAVIX) 75 MG tablet TAKE 1 TABLET BY MOUTH EVERY DAY  . ferrous sulfate 325 (65 FE) MG tablet Take 325 mg by mouth daily with breakfast.  . KLOR-CON M10 10 MEQ tablet Take 10 mEq by mouth daily.   Marland Kitchen losartan-hydrochlorothiazide (HYZAAR) 100-12.5 MG tablet Take 1 tablet by mouth daily.  . mometasone-formoterol (DULERA) 200-5 MCG/ACT AERO Inhale 2 puffs into the  lungs 2 (two) times daily.  . pantoprazole (PROTONIX) 40 MG tablet Take 1 tablet (40 mg total) by mouth daily.  . ranitidine (ZANTAC) 150 MG tablet Take 150 mg by mouth daily.  Marland Kitchen senna-docusate (SENOKOT-S) 8.6-50 MG tablet Take 1 tablet by mouth at bedtime.   . sildenafil (VIAGRA) 100 MG tablet Take 1 tablet (100 mg total) by mouth as needed for erectile dysfunction.  Marland Kitchen tiotropium (SPIRIVA) 18 MCG inhalation capsule Place 1 capsule (18 mcg total) into inhaler and inhale daily.   No facility-administered encounter medications on file as of 11/22/2016.     No Known Allergies   Immunization History  Administered Date(s) Administered  . Influenza Whole 08/11/2010, 10/10/2012  . Influenza,inj,Quad PF,36+ Mos 09/11/2013  . Influenza-Unspecified 09/11/2014, 09/14/2015, 09/30/2016  . Pneumococcal Conjugate-13 06/03/2009  . Pneumococcal Polysaccharide-23 08/11/2010  . Td 12/13/2002    Current Medications, Allergies, Past Medical History, Past Surgical History, Family History, and Social History were reviewed in [employer] record.   Review of Systems  Constitutional: Negative for fever and unexpected weight change.  HENT: Negative for congestion, dental problem, ear pain, nosebleeds, postnasal drip, rhinorrhea, sinus pressure, sneezing, sore throat and trouble swallowing.  Eyes: Negative for redness and itching.  Respiratory: Positive for cough and no wheezing. Negative for chest tightness and shortness of breath.  Cardiovascular: Negative for palpitations and leg swelling.  Gastrointestinal: Negative for nausea and vomiting.  Genitourinary: Negative for dysuria.  Musculoskeletal: Negative for joint swelling.  Skin: Negative for rash.  Neurological: Negative for headaches.  Hematological: Does not bruise/bleed easily.  Psychiatric/Behavioral: Negative for dysphoric mood. The patient is not nervous/anxious.    Objective:   Physical Exam    sl overweight  male in no acute distress Nose without purulence or discharge noted Neck without lymphadenopathy or thyromegaly Chest with decreased breath sounds, no active wheezing Cardiac exam with regular rate and rhythm Lower extremities without edema, no cyanosis Alert and oriented, moves all 4 extremities.   Assessment:     IMP >>     Severe airflow obstruction w/ GOLD Stage3 COPD>  He is on Dulera200-2spBid & ProairHFA prn; must quit smoking, stay active...     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    LUNG CANCER>  Non-small cell ca Dx 12/2015 & treated by [name] & XRT    On-going tobacco abuse>  He continues to smoke ?several cigs/d; we discussed smoking cessation options...     Cardiac>  HBP, CAD (DrRoss) w/ RCA stent, extensive atherosclerosis in Ao etc; on ASA81, Plavix75 (restarted after 03/2016 GIB), Hyzaar100-12.5, K10.    Medical>  HL on Lip80, BPH w/ BOO, Elev PSA ([place]), Gout on Allopurinol300, & ADM to [facility] 03/2016 w/ GIB- gastitis, duodenitis, ?duodenal mass, +  HPylori Ab test (neg Bx) and treated w/ triple therapy & improved (now taking Protonix40, Zantac150, Senakot-S); Anemia- on FeSO4...  PLAN >>  11/02/15>   [place] desperately needs to quit smoking- discussed Nicotine replacement options and offered Chantix;  Breathing meds refilled;  I rec that he STOP the Lasix for now & substitute ACETAZOLAMIDE 266m one tab daily in the afternoon;  He has already had the 2016 Flu vaccine;  Plan ROV in 610moooner if needed prn. 05/23/16>   Problems as listed;  Ryan Contreras needs to quit all smoking & grad increase his exercise program;  Continue Dulera200-2spBid & add MUCINEX 60034mabs- 2Bid w/ fluids;  He will maintain f/u w/ his PCP, Oncologist, & GI;  We plan recheck in 73mo20mooner if needed for any reason...  11/22/16>   Ryan Contreras has severe  chronic problems- followed regularly by [name], DrEllison etalHarriett Sinee would be better from the pulm standpoint if he'd quit all smoking & take his prescribed meds regularly (med non-compliance confirmed w/ call to his Pharm today); advised to take meds regularly, incr exercise program; we plan rov recheck in 73mo.29mo Plan:     Patient's Medications  New Prescriptions   TIOTROPIUM (SPIRIVA) 18 MCG INHALATION CAPSULE    Place 1 capsule (18 mcg total) into inhaler and inhale daily.  Previous Medications   ACETAMINOPHEN (TYLENOL) 500 MG TABLET    Take 250 mg by mouth every 6 (six) hours as needed for moderate pain. Reported on 04/29/2016   ACETAZOLAMIDE (DIAMOX) 250 MG TABLET    Take 1 tablet (250 mg total) by mouth daily.   ALLOPURINOL (ZYLOPRIM) 100 MG TABLET    Take 100 mg by mouth daily.   ASPIRIN 81 MG TABLET    Take 81 mg by mouth at bedtime. Reported on 05/02/2016   ATORVASTATIN (LIPITOR) 80 MG TABLET    TAKE 1 TABLET (80 MG TOTAL) BY MOUTH DAILY.   CLOPIDOGREL (PLAVIX) 75 MG TABLET    TAKE 1 TABLET BY MOUTH EVERY DAY   FERROUS SULFATE 325 (65 FE) MG TABLET    Take 325 mg by mouth daily with breakfast.   KLOR-CON M10 10 MEQ TABLET    Take 10 mEq by mouth daily.    LOSARTAN-HYDROCHLOROTHIAZIDE (HYZAAR) 100-12.5 MG TABLET    Take 1 tablet by mouth daily.   MOMETASONE-FORMOTEROL (DULERA) 200-5 MCG/ACT AERO    Inhale 2 puffs into the lungs 2 (two) times daily.   PANTOPRAZOLE (PROTONIX) 40 MG TABLET    Take 1 tablet (40 mg total) by mouth daily.   RANITIDINE (ZANTAC) 150 MG TABLET    Take 150 mg by mouth daily.   SENNA-DOCUSATE (SENOKOT-S) 8.6-50 MG TABLET    Take 1 tablet by mouth at bedtime.    SILDENAFIL (VIAGRA) 100 MG TABLET    Take 1 tablet (100 mg total) by mouth as needed for erectile dysfunction.  Modified Medications   No medications on file  Discontinued Medications   No medications on file

## 2016-11-22 NOTE — Patient Instructions (Signed)
Today we updated your med list in our EPIC system...    Continue your current medications the same...  Continue the DULERA- 2 sprays twice daily...  Add the [place]- inhale the contents of one capsule via handihaler once daily...  And PLEASE STOP ALL THE SMOKING!!!  Try to increase your exercise program-- consider joining the [facility] and work-out daily...  Call for any questions...  Let's plan a follow up visit in 77mo sooner if needed for problems..Marland KitchenMarland Kitchen

## 2016-11-24 ENCOUNTER — Telehealth: Payer: Self-pay | Admitting: Pulmonary Disease

## 2016-11-24 NOTE — Telephone Encounter (Signed)
Patient called back to get results...contact # [phone].Marland KitchenMarland KitchenMearl Contreras

## 2016-11-24 NOTE — Telephone Encounter (Signed)
SN pt is calling about his cxr results from 12/12 appt.  Please advise. thanks

## 2016-11-25 NOTE — Progress Notes (Signed)
Spoke with pt and notified of results per Dr. Nadel. Pt verbalized understanding and denied any questions. 

## 2016-11-25 NOTE — Telephone Encounter (Signed)
[phone] pt calling back

## 2016-11-25 NOTE — Telephone Encounter (Signed)
Please notify patient>   CXR looks OK- norm heart size, atherosclerotic calcif in Ao, COPD changes and interstitial prominence/ scarring in right mid lung vicinity of his XRT treatment- NAD...  Spoke with pt and notified of results per Dr. Lenna Gilford. Pt verbalized understanding and denied any questions.

## 2016-11-30 ENCOUNTER — Ambulatory Visit (HOSPITAL_COMMUNITY)
Admission: RE | Admit: 2016-11-30 | Discharge: 2016-11-30 | Disposition: A | Discharge status: Home / Self Care | Payer: Medicare Other | Source: Ambulatory Visit | Attending: Internal Medicine | Admitting: Internal Medicine

## 2016-11-30 ENCOUNTER — Encounter (HOSPITAL_COMMUNITY): Payer: Self-pay

## 2016-11-30 ENCOUNTER — Other Ambulatory Visit (HOSPITAL_BASED_OUTPATIENT_CLINIC_OR_DEPARTMENT_OTHER): Payer: Medicare Other

## 2016-11-30 DIAGNOSIS — C3490 Malignant neoplasm of unspecified part of unspecified bronchus or lung: Secondary | ICD-10-CM | POA: Diagnosis not present

## 2016-11-30 DIAGNOSIS — C3491 Malignant neoplasm of unspecified part of right bronchus or lung: Secondary | ICD-10-CM

## 2016-11-30 DIAGNOSIS — C3411 Malignant neoplasm of upper lobe, right bronchus or lung: Secondary | ICD-10-CM

## 2016-11-30 DIAGNOSIS — R911 Solitary pulmonary nodule: Secondary | ICD-10-CM | POA: Diagnosis not present

## 2016-11-30 LAB — CBC WITH DIFFERENTIAL/PLATELET
BASO%: 1 % (ref 0.0–2.0)
BASOS ABS: 0.1 10*3/uL (ref 0.0–0.1)
EOS ABS: 0.1 10*3/uL (ref 0.0–0.5)
EOS%: 1.8 % (ref 0.0–7.0)
HCT: 42.2 % (ref 38.4–49.9)
HGB: 13.8 g/dL (ref 13.0–17.1)
LYMPH%: 22.6 % (ref 14.0–49.0)
MCH: 32.5 pg (ref 27.2–33.4)
MCHC: 32.7 g/dL (ref 32.0–36.0)
MCV: 99.5 fL — AB (ref 79.3–98.0)
MONO#: 1.3 10*3/uL — AB (ref 0.1–0.9)
MONO%: 19.8 % — ABNORMAL HIGH (ref 0.0–14.0)
NEUT#: 3.6 10*3/uL (ref 1.5–6.5)
NEUT%: 54.8 % (ref 39.0–75.0)
PLATELETS: 247 10*3/uL (ref 140–400)
RBC: 4.24 10*6/uL (ref 4.20–5.82)
RDW: 16 % — ABNORMAL HIGH (ref 11.0–14.6)
WBC: 6.6 10*3/uL (ref 4.0–10.3)
lymph#: 1.5 10*3/uL (ref 0.9–3.3)

## 2016-11-30 LAB — COMPREHENSIVE METABOLIC PANEL
ALT: 14 U/L (ref 0–55)
ANION GAP: 7 meq/L (ref 3–11)
AST: 19 U/L (ref 5–34)
Albumin: 3.6 g/dL (ref 3.5–5.0)
Alkaline Phosphatase: 107 U/L (ref 40–150)
BUN: 11.2 mg/dL (ref 7.0–26.0)
CALCIUM: 9.1 mg/dL (ref 8.4–10.4)
CHLORIDE: 99 meq/L (ref 98–109)
CO2: 33 mEq/L — ABNORMAL HIGH (ref 22–29)
CREATININE: 0.9 mg/dL (ref 0.7–1.3)
Glucose: 111 mg/dl (ref 70–140)
Potassium: 4.1 mEq/L (ref 3.5–5.1)
Sodium: 139 mEq/L (ref 136–145)
Total Bilirubin: 0.46 mg/dL (ref 0.20–1.20)
Total Protein: 7.3 g/dL (ref 6.4–8.3)

## 2016-11-30 MED ORDER — IOPAMIDOL (ISOVUE-300) INJECTION 61%
INTRAVENOUS | Status: AC
  Administered 2016-11-30: 75 mL
  Filled 2016-11-30: qty 75

## 2016-12-03 ENCOUNTER — Other Ambulatory Visit: Payer: Self-pay | Admitting: Pulmonary Disease

## 2016-12-06 ENCOUNTER — Other Ambulatory Visit: Payer: Self-pay

## 2016-12-06 MED ORDER — MOMETASONE FURO-FORMOTEROL FUM 200-5 MCG/ACT IN AERO: 2.0000 | INHALATION_SPRAY | Freq: Two times a day (BID) | RESPIRATORY_TRACT | 6 refills | Status: DC

## 2016-12-19 ENCOUNTER — Ambulatory Visit (HOSPITAL_BASED_OUTPATIENT_CLINIC_OR_DEPARTMENT_OTHER): Payer: Medicare Other | Admitting: Internal Medicine

## 2016-12-19 ENCOUNTER — Encounter: Payer: Self-pay | Admitting: Internal Medicine

## 2016-12-19 ENCOUNTER — Telehealth: Payer: Self-pay | Admitting: Internal Medicine

## 2016-12-19 VITALS — BP 135/77 | HR 94 | Temp 97.9°F | Resp 16 | Ht 73.0 in | Wt 192.4 lb

## 2016-12-19 DIAGNOSIS — C3411 Malignant neoplasm of upper lobe, right bronchus or lung: Secondary | ICD-10-CM

## 2016-12-19 DIAGNOSIS — R0602 Shortness of breath: Secondary | ICD-10-CM

## 2016-12-19 DIAGNOSIS — C3491 Malignant neoplasm of unspecified part of right bronchus or lung: Secondary | ICD-10-CM

## 2016-12-19 NOTE — Telephone Encounter (Signed)
Appointments scheduled per 1/8 LOS. Patient given AVS report and calendars with future scheduled appointments. Patient aware of scan appointment.

## 2016-12-19 NOTE — Patient Instructions (Signed)
Steps to Quit Smoking Smoking tobacco can be bad for your health. It can also affect almost every organ in your body. Smoking puts you and people around you at risk for many serious long-lasting (chronic) diseases. Quitting smoking is hard, but it is one of the best things that you can do for your health. It is never too late to quit. What are the benefits of quitting smoking? When you quit smoking, you lower your risk for getting serious diseases and conditions. They can include:  Lung cancer or lung disease.  Heart disease.  Stroke.  Heart attack.  Not being able to have children (infertility).  Weak bones (osteoporosis) and broken bones (fractures). If you have coughing, wheezing, and shortness of breath, those symptoms may get better when you quit. You may also get sick less often. If you are pregnant, quitting smoking can help to lower your chances of having a baby of low birth weight. What can I do to help me quit smoking? Talk with your doctor about what can help you quit smoking. Some things you can do (strategies) include:  Quitting smoking totally, instead of slowly cutting back how much you smoke over a period of time.  Going to in-person counseling. You are more likely to quit if you go to many counseling sessions.  Using resources and support systems, such as:  Online chats with a counselor.  Phone quitlines.  Printed self-help materials.  Support groups or group counseling.  Text messaging programs.  Mobile phone apps or applications.  Taking medicines. Some of these medicines may have nicotine in them. If you are pregnant or breastfeeding, do not take any medicines to quit smoking unless your doctor says it is okay. Talk with your doctor about counseling or other things that can help you. Talk with your doctor about using more than one strategy at the same time, such as taking medicines while you are also going to in-person counseling. This can help make quitting  easier. What things can I do to make it easier to quit? Quitting smoking might feel very hard at first, but there is a lot that you can do to make it easier. Take these steps:  Talk to your family and friends. Ask them to support and encourage you.  Call phone quitlines, reach out to support groups, or work with a counselor.  Ask people who smoke to not smoke around you.  Avoid places that make you want (trigger) to smoke, such as:  Bars.  Parties.  Smoke-break areas at work.  Spend time with people who do not smoke.  Lower the stress in your life. Stress can make you want to smoke. Try these things to help your stress:  Getting regular exercise.  Deep-breathing exercises.  Yoga.  Meditating.  Doing a body scan. To do this, close your eyes, focus on one area of your body at a time from head to toe, and notice which parts of your body are tense. Try to relax the muscles in those areas.  Download or buy apps on your mobile phone or tablet that can help you stick to your quit plan. There are many free apps, such as QuitGuide from the CDC (Centers for Disease Control and Prevention). You can find more support from smokefree.gov and other websites. This information is not intended to replace advice given to you by your health care provider. Make sure you discuss any questions you have with your health care provider. Document Released: 09/24/2009 Document Revised: 07/26/2016 Document   Reviewed: 04/14/2015 Elsevier Interactive Patient Education  2017 Elsevier Inc.  

## 2016-12-19 NOTE — Progress Notes (Signed)
[facility] [phone]   [phone]  OFFICE PROGRESS NOTE  Renato Shin, MD [address] [place] [address]  DIAGNOSIS: Unresectable Stage IIA (T1b, N1, M0) non-small cell lung cancer, squamous cell carcinoma presented with right upper lobe lung nodule and hilar lymphadenopathy diagnosed in January 2017.  PRIOR THERAPY: Course of concurrent chemoradiation with weekly carboplatin for AUC of 2 and paclitaxel 45 MG/M2. Status post 7 cycles, last dose was given 03/14/2016 with partial response.  CURRENT THERAPY: Observation.  INTERVAL HISTORY: Ryan Contreras 76 y.o. male cancer clinic today accompanied by his sister. The patient is feeling fine with no specific complaints except for shortness of breath. He denied having any significant chest pain, cough or hemoptysis. He denied having any significant weight loss or night sweats. He has no fever or chills, no nausea or vomiting. He had repeat CT scan of the chest performed recently and he is here for evaluation and discussion of his scan results.   MEDICAL HISTORY: Past Medical History:  Diagnosis Date  . CAD, NATIVE VESSEL    coronary stent x1-tx. Plavix use.  Marland Kitchen COPD (chronic obstructive pulmonary disease) ([place])    due to 50+ years Smoking.  . Gastrointestinal bleeding, upper 04/29/2016  . Gout, unspecified    foot  . HYPERLIPIDEMIA   . HYPERTENSION   . PEPTIC ULCER DISEASE   . TOBACCO ABUSE   . Type 2 diabetes mellitus ([place])    denies on 01-06-16    ALLERGIES:  has No Known Allergies.  MEDICATIONS:  Current Outpatient Prescriptions  Medication Sig Dispense Refill  . acetaminophen (TYLENOL) 500 MG tablet Take 250 mg by mouth every 6 (six) hours as needed for moderate pain. Reported on 04/29/2016    . acetaZOLAMIDE (DIAMOX) 250 MG tablet Take 1 tablet (250 mg total) by mouth daily. 30 tablet 2  . allopurinol (ZYLOPRIM) 100 MG tablet Take 100 mg by mouth daily.    Marland Kitchen  aspirin 81 MG tablet Take 81 mg by mouth at bedtime. Reported on 05/02/2016    . atorvastatin (LIPITOR) 80 MG tablet TAKE 1 TABLET (80 MG TOTAL) BY MOUTH DAILY. 30 tablet 6  . clopidogrel (PLAVIX) 75 MG tablet TAKE 1 TABLET BY MOUTH EVERY DAY 90 tablet 2  . ferrous sulfate 325 (65 FE) MG tablet Take 325 mg by mouth daily with breakfast.    . KLOR-CON M10 10 MEQ tablet Take 10 mEq by mouth daily.     Marland Kitchen losartan-hydrochlorothiazide (HYZAAR) 100-12.5 MG tablet Take 1 tablet by mouth daily. 90 tablet 3  . mometasone-formoterol (DULERA) 200-5 MCG/ACT AERO Inhale 2 puffs into the lungs 2 (two) times daily. 1 Inhaler 6  . pantoprazole (PROTONIX) 40 MG tablet Take 1 tablet (40 mg total) by mouth daily. 90 tablet 3  . ranitidine (ZANTAC) 150 MG tablet Take 150 mg by mouth daily.  1  . senna-docusate (SENOKOT-S) 8.6-50 MG tablet Take 1 tablet by mouth at bedtime.     . sildenafil (VIAGRA) 100 MG tablet Take 1 tablet (100 mg total) by mouth as needed for erectile dysfunction. 1 tablet 4  . tiotropium (SPIRIVA) 18 MCG inhalation capsule Place 1 capsule (18 mcg total) into inhaler and inhale daily. 30 capsule 6   No current facility-administered medications for this visit.     SURGICAL HISTORY:  Past Surgical History:  Procedure Laterality Date  . CARDIAC CATHETERIZATION     '06-stent placed, 6'13 - Dr. Mamie Nick.  [facility] follows  . CORONARY STENT PLACEMENT     s/p  . ENDOBRONCHIAL ULTRASOUND Bilateral 01/11/2016   Procedure: ENDOBRONCHIAL ULTRASOUND;  Surgeon: Collene Gobble, MD;  Location: [facility];  Service: Endoscopy;  Laterality: Bilateral;    REVIEW OF SYSTEMS:  A comprehensive review of systems was negative except for: Respiratory: positive for dyspnea on exertion   PHYSICAL EXAMINATION: General appearance: alert, cooperative, fatigued and no distress Head: Normocephalic, without obvious abnormality, atraumatic Neck: no adenopathy, no JVD, supple, symmetrical, trachea midline and thyroid not  enlarged, symmetric, no tenderness/mass/nodules Lymph nodes: Cervical, supraclavicular, and axillary nodes normal. Resp: clear to auscultation bilaterally Back: symmetric, no curvature. ROM normal. No CVA tenderness. Cardio: regular rate and rhythm, S1, S2 normal, no murmur, click, rub or gallop GI: soft, non-tender; bowel sounds normal; no masses,  no organomegaly Extremities: extremities normal, atraumatic, no cyanosis or edema  ECOG PERFORMANCE STATUS: 1 - Symptomatic but completely ambulatory  There were no vitals taken for this visit.  LABORATORY DATA: Lab Results  Component Value Date   WBC 6.6 11/30/2016   HGB 13.8 11/30/2016   HCT 42.2 11/30/2016   MCV 99.5 (H) 11/30/2016   PLT 247 11/30/2016      Chemistry      Component Value Date/Time   NA 139 11/30/2016 0925   K 4.1 11/30/2016 0925   CL 100 08/25/2016 1110   CO2 33 (H) 11/30/2016 0925   BUN 11.2 11/30/2016 0925   CREATININE 0.9 11/30/2016 0925      Component Value Date/Time   CALCIUM 9.1 11/30/2016 0925   ALKPHOS 107 11/30/2016 0925   AST 19 11/30/2016 0925   ALT 14 11/30/2016 0925   BILITOT 0.46 11/30/2016 0925       RADIOGRAPHIC STUDIES: Dg Chest 2 View  Result Date: 11/22/2016 CLINICAL DATA:  History of lung malignancy, coronary artery disease, COPD, current smoker. EXAM: CHEST  2 VIEW COMPARISON:  Chest x-ray of Apr 13, 2015 and chest CT scan of August 01, 2016. FINDINGS: The lungs remain hyperinflated. There is mild volume loss posteriorly in the right lung. Mildly increased interstitial density is noted peripherally in the right mid lung. A discrete nodule is not observed but this is in the area of the previously described upper lobe lesion. The heart and pulmonary vascularity are normal. There is calcification in the wall of the aortic arch. The bony thorax exhibits no acute abnormality. IMPRESSION: Stable interstitial prominence in the right mid lung since the August 2017 CT scan. No suspicious mass is  observed. Stable changes of COPD. Chronic volume loss at the right lung base posteriorly. Thoracic aortic atherosclerosis. Electronically Signed   By: David  Martinique M.D.   On: 11/22/2016 11:30   Ct Chest W Contrast  Result Date: 11/30/2016 CLINICAL DATA:  Non-small-cell lung cancer status post chemo and radiation therapy. EXAM: CT CHEST WITH CONTRAST TECHNIQUE: Multidetector CT imaging of the chest was performed during intravenous contrast administration. CONTRAST:  1 ISOVUE-300 IOPAMIDOL (ISOVUE-300) INJECTION 61% COMPARISON:  04/27/2016 FINDINGS: Cardiovascular: The heart size is normal. No pericardial effusion. Coronary artery calcification is noted. Atherosclerotic calcification is noted in the wall of the thoracic aorta. Mediastinum/Nodes: No mediastinal lymphadenopathy. No left hilar lymphadenopathy. Stable appearance of soft tissue thickening in the right hilum without discrete lymphadenopathy. The esophagus has normal imaging features. There is no axillary lymphadenopathy. Lungs/Pleura: Centrilobular and paraseptal emphysema noted bilaterally. Central right upper lobe lesion measured previously at 21 x 11 mm now measures 19 x 8 mm (image  68 series 5). The more peripheral area of peribronchovascular nodularity seen in the right lung on the prior study has almost completely resolved in the interval suggesting resolution of infectious/ inflammatory process. 9 mm in the medial left lower lobe measures minimally larger today at 12 mm, likely secondary to differential slice acquisition as qualitatively, no discernible change is evident in this nodule. No focal airspace consolidation. No pulmonary edema or pleural effusion. Upper Abdomen: Tiny left adrenal nodule seen previously is stable to decreased in the interval. This was previously characterized as benign adrenal adenoma otherwise unremarkable. Musculoskeletal: Bone windows reveal no worrisome lytic or sclerotic osseous lesions. 3.4 cm subcutaneous  nodule in the anterior midline thoracic wall is not substantially changed in the interval likely represents sebaceous cyst. By report, this nodule was non hypermetabolic on PET-CT from 46/95/0722. IMPRESSION: 1. Continued further decrease in size of the central right upper lobe pulmonary nodule. 2. Interval near complete resolution of peribronchovascular peripheral nodularity in the right upper lobe. 3. No substantial change in the left lower lobe pulmonary nodule. 4. Tiny left adrenal nodule stable, previously characterized as benign adenoma. 5. Stable appearance of midline anterior chest wall probable sebaceous cyst. Electronically Signed   By: Misty Stanley M.D.   On: 11/30/2016 13:08    ASSESSMENT AND PLAN:  This is a very pleasant 76 years old African-American male with history of unresectable a stage II a non-small cell lung cancer, squamous cell carcinoma status post concurrent chemoradiation. The patient is currently on observation and he is feeling fine. He denied having any concerning complaint except for the baseline shortness of breath. His recent CT scan of the chest showed no evidence for disease progression. I discussed the scan results with the patient and his sister. I recommended for him to continue on observation with repeat CT scan of the chest in 6 months. He was advised to call immediately if he has any concerning symptoms in the interval. The patient voices understanding of current disease status and treatment options and is in agreement with the current care plan.  All questions were answered. The patient knows to call the clinic with any problems, questions or concerns. We can certainly see the patient much sooner if necessary. Disclaimer: This note was dictated with voice recognition software. Similar sounding words can inadvertently be transcribed and may be missed upon review.  Disclaimer: This note was dictated with voice recognition software. Similar sounding words can  inadvertently be transcribed and may not be corrected upon review.

## 2016-12-26 NOTE — Progress Notes (Signed)
Subjective:    Patient ID: Ryan Contreras, male    DOB: 07-29-1941, 76 y.o.   MRN: [id number]  HPI Pt is here for regular wellness examination, and is feeling pretty well in general, and says chronic med probs are stable, except as noted below Past Medical History:  Diagnosis Date  . CAD, NATIVE VESSEL    coronary stent x1-tx. Plavix use.  Marland Kitchen COPD (chronic obstructive pulmonary disease) ([place])    due to 50+ years Smoking.  . Gastrointestinal bleeding, upper 04/29/2016  . Gout, unspecified    foot  . HYPERLIPIDEMIA   . HYPERTENSION   . PEPTIC ULCER DISEASE   . TOBACCO ABUSE   . Type 2 diabetes mellitus ([place])    denies on 01-06-16    Past Surgical History:  Procedure Laterality Date  . CARDIAC CATHETERIZATION     '06-stent placed, 6'13 - Dr. Lizbeth Bark follows  . CORONARY STENT PLACEMENT     s/p  . ENDOBRONCHIAL ULTRASOUND Bilateral 01/11/2016   Procedure: ENDOBRONCHIAL ULTRASOUND;  Surgeon: Collene Gobble, MD;  Location: [facility];  Service: Endoscopy;  Laterality: Bilateral;    Social History   Social History  . Marital status: Divorced    Spouse name: N/A  . Number of children: 2  . Years of education: N/A   Occupational History  . 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 Smoking status: Current Every Day Smoker    Packs/day: 0.50    Years: 50.00    Types: Cigarettes  . Smokeless tobacco: Never Used     Comment: currently smoking 1cig/day (05/23/16)  . Alcohol use 0.0 oz/week     Comment: 1 beer/day  . Drug use: No  . Sexual activity: Not on file   Other Topics Concern  . Not on file   Social History Narrative   Widowed 2003    Current Outpatient Prescriptions on File Prior to Visit  Medication Sig Dispense Refill  . acetaminophen (TYLENOL) 500 MG tablet Take 250 mg by mouth every 6 (six) hours as needed for moderate pain. Reported on 04/29/2016    . acetaZOLAMIDE (DIAMOX) 250 MG tablet Take 1 tablet (250 mg total) by mouth daily. 30 tablet 2    . allopurinol (ZYLOPRIM) 100 MG tablet Take 100 mg by mouth daily.    Marland Kitchen aspirin 81 MG tablet Take 81 mg by mouth at bedtime. Reported on 05/02/2016    . atorvastatin (LIPITOR) 80 MG tablet TAKE 1 TABLET (80 MG TOTAL) BY MOUTH DAILY. 30 tablet 6  . clopidogrel (PLAVIX) 75 MG tablet TAKE 1 TABLET BY MOUTH EVERY DAY 90 tablet 2  . ferrous sulfate 325 (65 FE) MG tablet Take 325 mg by mouth daily with breakfast.    . KLOR-CON M10 10 MEQ tablet Take 10 mEq by mouth daily.     Marland Kitchen losartan-hydrochlorothiazide (HYZAAR) 100-12.5 MG tablet Take 1 tablet by mouth daily. 90 tablet 3  . mometasone-formoterol (DULERA) 200-5 MCG/ACT AERO Inhale 2 puffs into the lungs 2 (two) times daily. 1 Inhaler 6  . pantoprazole (PROTONIX) 40 MG tablet Take 1 tablet (40 mg total) by mouth daily. 90 tablet 3  . ranitidine (ZANTAC) 150 MG tablet Take 150 mg by mouth daily.  1  . senna-docusate (SENOKOT-S) 8.6-50 MG tablet Take 1 tablet by mouth at bedtime.     . sildenafil (VIAGRA) 100 MG tablet Take 1 tablet (100 mg total) by mouth as needed for erectile dysfunction. 1 tablet 4  .  tiotropium (SPIRIVA) 18 MCG inhalation capsule Place 1 capsule (18 mcg total) into inhaler and inhale daily. 30 capsule 6   No current facility-administered medications on file prior to visit.     No Known Allergies  Family History  Problem Relation Age of Onset  . Cancer Sister     Uncertain type  . Cancer Brother     Uncertain type  . Arthritis Mother   . Hypertension Father   . Heart attack Father     BP 122/70   Pulse (!) 101   Ht '6\' 1"'$  (1.854 m)   Wt 193 lb (87.5 kg)   SpO2 92%   BMI 25.46 kg/m     Review of Systems Constitutional: Positive for fatigue.  he has gained weight.  HENT: Negative for nosebleeds.   Eyes: Negative for itching.  Respiratory: no change in chronic shortness of breath.   Cardiovascular: Negative for chest pain.  Gastrointestinal: Negative for blood in stool.  Endocrine: no cold  intolerance. Genitourinary: Negative for hematuria.  Musculoskeletal: Negative for back pain.  Skin: Negative for rash.  Allergic/Immunologic: Negative for environmental allergies.  Neurological: Negative for syncope.  Psychiatric/Behavioral: Negative for sleep disturbance.     Objective:   Physical Exam VS: see vs page GEN: no distress HEAD: head: no deformity eyes: no periorbital swelling, no proptosis external nose and ears are normal mouth: no lesion seen NECK: supple, thyroid is not enlarged CHEST WALL: no deformity LUNGS: clear to auscultation BREASTS:  No gynecomastia CV: reg rate and rhythm, no murmur ABD: abdomen is soft, nontender.  no hepatosplenomegaly.  not distended.  no hernia.   RECTAL/PROSTATE: sees urology.   MUSCULOSKELETAL: muscle bulk and strength are grossly normal.  no obvious joint swelling.  gait is normal and steady.   EXTEMITIES: no deformity.  no ulcer on the feet, but the skin is dry.  feet are of normal color and temp.  no edema.  There is bilateral onychomycosis of the toenails.   PULSES: dorsalis pedis intact bilat.  no carotid bruit. NEURO:  cn 2-12 grossly intact.   readily moves all 4's.  sensation is intact to touch on the feet.   SKIN:  Normal texture and temperature.  No rash or suspicious lesion is visible.   NODES:  None palpable at the neck PSYCH: alert, well-oriented.  Does not appear anxious nor depressed.  I personally reviewed electrocardiogram tracing (today): Indication: DM Impression: NSR.  MI.  No hypertrophy. occasional PVC.  Right atrial enlargement. Compared to 2016: no change    Assessment & Plan:  Wellness visit today, with problems stable, except as noted. Patient is advised the following: Patient Instructions  Please consider these measures for your health:  minimize alcohol.  Do not use tobacco products.  Have a colonoscopy at least every 10 years from age 89.  Keep firearms safely stored.  Always use seat belts.  have  working smoke alarms in your home.  See an eye doctor and dentist regularly.  Never drive under the influence of alcohol or drugs (including prescription drugs).   It is critically important to prevent falling down (keep floor areas well-lit, dry, and free of loose objects.  If you have a cane, walker, or wheelchair, you should use it, even for short trips around the house.  Wear flat-soled shoes.  Also, try not to rush).   blood tests are requested for you today.  We'll let you know about the results. Please come back for a follow-up appointment  in 6 months.

## 2016-12-27 ENCOUNTER — Ambulatory Visit (INDEPENDENT_AMBULATORY_CARE_PROVIDER_SITE_OTHER): Payer: Medicare Other | Admitting: Endocrinology

## 2016-12-27 ENCOUNTER — Encounter: Payer: Self-pay | Admitting: Endocrinology

## 2016-12-27 VITALS — BP 122/70 | HR 101 | Ht 73.0 in | Wt 193.0 lb

## 2016-12-27 DIAGNOSIS — E1159 Type 2 diabetes mellitus with other circulatory complications: Secondary | ICD-10-CM

## 2016-12-27 DIAGNOSIS — E785 Hyperlipidemia, unspecified: Secondary | ICD-10-CM | POA: Diagnosis not present

## 2016-12-27 DIAGNOSIS — M1A9XX0 Chronic gout, unspecified, without tophus (tophi): Secondary | ICD-10-CM

## 2016-12-27 DIAGNOSIS — Z0000 Encounter for general adult medical examination without abnormal findings: Secondary | ICD-10-CM | POA: Diagnosis not present

## 2016-12-27 LAB — URINALYSIS, ROUTINE W REFLEX MICROSCOPIC
BILIRUBIN URINE: NEGATIVE
HGB URINE DIPSTICK: NEGATIVE
Ketones, ur: NEGATIVE
LEUKOCYTES UA: NEGATIVE
Nitrite: NEGATIVE
PH: 5.5 (ref 5.0–8.0)
RBC / HPF: NONE SEEN (ref 0–?)
Specific Gravity, Urine: 1.02 (ref 1.000–1.030)
TOTAL PROTEIN, URINE-UPE24: NEGATIVE
Urine Glucose: NEGATIVE
Urobilinogen, UA: 0.2 (ref 0.0–1.0)

## 2016-12-27 LAB — MICROALBUMIN / CREATININE URINE RATIO
CREATININE, U: 137.9 mg/dL
MICROALB/CREAT RATIO: 0.8 mg/g (ref 0.0–30.0)
Microalb, Ur: 1.1 mg/dL (ref 0.0–1.9)

## 2016-12-27 LAB — LIPID PANEL
CHOL/HDL RATIO: 3
Cholesterol: 153 mg/dL (ref 0–200)
HDL: 60.9 mg/dL (ref >39.00)
LDL CALC: 81 mg/dL (ref 0–99)
NONHDL: 92.21
Triglycerides: 54 mg/dL (ref 0.0–149.0)
VLDL: 10.8 mg/dL (ref 0.0–40.0)

## 2016-12-27 LAB — TSH: TSH: 1.03 u[IU]/mL (ref 0.35–4.50)

## 2016-12-27 LAB — POCT GLYCOSYLATED HEMOGLOBIN (HGB A1C): Hemoglobin A1C: 6.1

## 2016-12-27 LAB — URIC ACID: Uric Acid, Serum: 4.9 mg/dL (ref 4.0–7.8)

## 2016-12-27 NOTE — Progress Notes (Signed)
we discussed code status.  pt requests full code, but would not want to be started or maintained on artificial life-support measures if there was not a reasonable chance of recovery 

## 2016-12-27 NOTE — Patient Instructions (Addendum)
Please consider these measures for your health:  minimize alcohol.  Do not use tobacco products.  Have a colonoscopy at least every 10 years from age 75.  Keep firearms safely stored.  Always use seat belts.  have working smoke alarms in your home.  See an eye doctor and dentist regularly.  Never drive under the influence of alcohol or drugs (including prescription drugs).   It is critically important to prevent falling down (keep floor areas well-lit, dry, and free of loose objects.  If you have a cane, walker, or wheelchair, you should use it, even for short trips around the house.  Wear flat-soled shoes.  Also, try not to rush).   blood tests are requested for you today.  We'll let you know about the results. Please come back for a follow-up appointment in 6 months.

## 2017-02-21 ENCOUNTER — Other Ambulatory Visit: Payer: Self-pay | Admitting: Pulmonary Disease

## 2017-02-21 MED ORDER — BUDESONIDE-FORMOTEROL FUMARATE 160-4.5 MCG/ACT IN AERO: 2.0000 | INHALATION_SPRAY | Freq: Two times a day (BID) | RESPIRATORY_TRACT | 6 refills | Status: DC

## 2017-03-01 ENCOUNTER — Other Ambulatory Visit: Payer: Self-pay | Admitting: Endocrinology

## 2017-03-01 ENCOUNTER — Other Ambulatory Visit: Payer: Self-pay | Admitting: Internal Medicine

## 2017-04-07 ENCOUNTER — Other Ambulatory Visit: Payer: Self-pay | Admitting: Internal Medicine

## 2017-04-11 ENCOUNTER — Telehealth: Payer: Self-pay | Admitting: Pulmonary Disease

## 2017-04-11 MED ORDER — BUDESONIDE-FORMOTEROL FUMARATE 160-4.5 MCG/ACT IN AERO: 2.0000 | INHALATION_SPRAY | Freq: Two times a day (BID) | RESPIRATORY_TRACT | 0 refills | Status: DC

## 2017-04-11 NOTE — Telephone Encounter (Signed)
Called and spoke with pt and he is aware of sample of the symbicort 160 that has been left up front.

## 2017-05-18 ENCOUNTER — Other Ambulatory Visit: Payer: Self-pay | Admitting: Endocrinology

## 2017-05-23 ENCOUNTER — Ambulatory Visit: Payer: Medicare Other | Admitting: Pulmonary Disease

## 2017-05-24 ENCOUNTER — Other Ambulatory Visit (INDEPENDENT_AMBULATORY_CARE_PROVIDER_SITE_OTHER): Payer: Medicare Other

## 2017-05-24 ENCOUNTER — Ambulatory Visit (INDEPENDENT_AMBULATORY_CARE_PROVIDER_SITE_OTHER): Payer: Medicare Other | Admitting: Pulmonary Disease

## 2017-05-24 ENCOUNTER — Encounter: Payer: Self-pay | Admitting: Pulmonary Disease

## 2017-05-24 VITALS — BP 114/64 | HR 89 | Temp 98.1°F | Ht 73.0 in | Wt 195.5 lb

## 2017-05-24 DIAGNOSIS — J449 Chronic obstructive pulmonary disease, unspecified: Secondary | ICD-10-CM | POA: Diagnosis not present

## 2017-05-24 DIAGNOSIS — I2510 Atherosclerotic heart disease of native coronary artery without angina pectoris: Secondary | ICD-10-CM

## 2017-05-24 DIAGNOSIS — J9612 Chronic respiratory failure with hypercapnia: Secondary | ICD-10-CM

## 2017-05-24 DIAGNOSIS — C3491 Malignant neoplasm of unspecified part of right bronchus or lung: Secondary | ICD-10-CM | POA: Diagnosis not present

## 2017-05-24 DIAGNOSIS — F17210 Nicotine dependence, cigarettes, uncomplicated: Secondary | ICD-10-CM

## 2017-05-24 DIAGNOSIS — I10 Essential (primary) hypertension: Secondary | ICD-10-CM

## 2017-05-24 LAB — SEDIMENTATION RATE: SED RATE: 40 mm/h — AB (ref 0–20)

## 2017-05-24 LAB — CBC WITH DIFFERENTIAL/PLATELET
Basophils Absolute: 0.1 10*3/uL (ref 0.0–0.1)
Basophils Relative: 0.9 % (ref 0.0–3.0)
EOS PCT: 1.6 % (ref 0.0–5.0)
Eosinophils Absolute: 0.1 10*3/uL (ref 0.0–0.7)
HEMATOCRIT: 40.7 % (ref 39.0–52.0)
HEMOGLOBIN: 13.5 g/dL (ref 13.0–17.0)
Lymphocytes Relative: 18.6 % (ref 12.0–46.0)
Lymphs Abs: 1.5 10*3/uL (ref 0.7–4.0)
MCHC: 33.2 g/dL (ref 30.0–36.0)
MCV: 98.7 fl (ref 78.0–100.0)
MONO ABS: 1.4 10*3/uL — AB (ref 0.1–1.0)
MONOS PCT: 17 % — AB (ref 3.0–12.0)
Neutro Abs: 5 10*3/uL (ref 1.4–7.7)
Neutrophils Relative %: 61.9 % (ref 43.0–77.0)
Platelets: 302 10*3/uL (ref 150.0–400.0)
RBC: 4.13 Mil/uL — AB (ref 4.22–5.81)
RDW: 15.4 % (ref 11.5–15.5)
WBC: 8 10*3/uL (ref 4.0–10.5)

## 2017-05-24 LAB — BASIC METABOLIC PANEL
BUN: 15 mg/dL (ref 6–23)
CO2: 38 mEq/L — ABNORMAL HIGH (ref 19–32)
Calcium: 9.2 mg/dL (ref 8.4–10.5)
Chloride: 99 mEq/L (ref 96–112)
Creatinine, Ser: 0.81 mg/dL (ref 0.40–1.50)
GFR: 119.96 mL/min (ref >60.00)
Glucose, Bld: 112 mg/dL — ABNORMAL HIGH (ref 70–99)
POTASSIUM: 3.9 meq/L (ref 3.5–5.1)
SODIUM: 141 meq/L (ref 135–145)

## 2017-05-24 MED ORDER — BUDESONIDE-FORMOTEROL FUMARATE 160-4.5 MCG/ACT IN AERO: 2.0000 | INHALATION_SPRAY | Freq: Two times a day (BID) | RESPIRATORY_TRACT | 0 refills | Status: DC

## 2017-05-24 MED ORDER — UMECLIDINIUM BROMIDE 62.5 MCG/INH IN AEPB: 1.0000 | INHALATION_SPRAY | Freq: Every day | RESPIRATORY_TRACT | 0 refills | Status: DC

## 2017-05-24 NOTE — Patient Instructions (Signed)
Today we updated your med list in our EPIC system...    Continue your current medications the same...  Today we checked an ambulatory oximetry test... We also did some follow up blood work...    We will contact you w/ the results when available...   YOU NEED TO QUIT THE LAST OF THOSE NASTY CIGARETTES!!!  Use the SYMBICORT160- 2 sprays twice daily...    And the new INCRUSE one inhalation once daily...       TAKE BOTH OF THESE INHALERS REGULARLY EVERY DAY!  Be sure to keep your follow up appt w/ [name]...  Call for any questions...  Let's plan a follow up visit in 60mo, sooner if needed for problems.Marland KitchenMarland Kitchen

## 2017-05-24 NOTE — Progress Notes (Signed)
Subjective:     Patient ID: Ryan Contreras, male   DOB: August 23, 1941, 76 y.o.   MRN: [id number]  HPI 76 y/o BM followed for severe COPD, Hx hypercapnic resp failure, still smoking cigarettes despite Dx non-small cell lung cancer 12/2015 treated by [name] w/ concurrent chemoradiation...  ~  04/27/15:  25moROV w/ KC>        Patient comes in today for follow-up of his known COPD. He is staying on his bronchodilator regimen, but unfortunately continues to smoke. He feels that his exertional tolerance is at baseline, and has not had an acute exacerbation since the last visit. He does continue to have cough and nonpurulent mucus, but I have explained this is directly related to his smoking.      REC>  The patient appears to be at his usual baseline from the last visit, and denies any recent acute exacerbation. He is staying on Symbicort, as well as as needed albuterol. Unfortunately, he continues to smoke, and I have stressed to him the importance of total smoking cessation. I have asked him to stay as active as possible, and to follow-up again in 6 months. - Stay on symbicort everyday - Work on some type of exercise - Try to quit smoking.This is the most important treatment for you - Followup with Dr. NLenna Gilfordin 631mo  ~  November 02, 2015:  71m28moV w/ SN>        Ryan Contreras [age]o, still smokes ~1/2ppd, and still works as a cusSports coach [employer];  He remains on Symbicort160-2spBid and uses ProairHFA prn;  He notes mild cough, small amt of clear mucus w/o discoloration or blood; he notes SOB/DOE if her rushes or takes the stairs, level ground and ADLs are OK (no change for yrs);  He denies any recent infections...   He started smoking in his teens, he has been smoking for 55 yrs up to 1ppd, and decr to 1/2ppd recently...      EXAM shows Afeb, VSS, O2sat=93% on RA at rest;  HEENT- neg, Mallampati4;  Chest- decr BS bilat & few scat rhonchi, no w/r;  Heart- RR w/o m/r/g heard;  Abd- soft, neg;   Ext- VI, no c/c/e;  Neuro- no focal deficits...   PFT 05/20/11 showed FVC=3.52 (72%), FEV1=1.52 (46%), %1sec=43, mid-flows reduced at 19% predicted; post bronchodil there was a 7% improvement in FEV1;  TLC=6.64 (91%), RV=3.04 (113%), RV/TLC=46;  DLCO=80% predicted.  CXR 04/13/15 showed norm heart size, COPD, mild right base Atx, NAD...   LABS 9-09/2015:  Chems- ok x HCO3=42 indicative of CO2 retention & renal compensation (he may benefit from Diamox);  CBC- wnl;  TSH=0.74;  BNP=216...  NOTE> he is on Hyzaar 100-25 AND Lasix20... IMP/PLAN>>  Ryan Contreras desperately needs to quit smoking- discussed Nicotine replacement options and offered Chantix;  Breathing meds refilled;  I rec that he STOP the Lasix for now & substitute ACETAZOLAMIDE '250mg'$  one tab daily in the afternoon;  He has already had the 2016 Flu vaccine;  Plan ROV in 71mo61moner if needed prn...  ADDENDUM>>  Ryan Contreras had a Low-dose Screening Chest CT 11/26/15- norm heart size, atherosclerosis of Ao & all 3 coronaries, stent in RCA, right hilar fullness c/w adenopathy, 2cm medial RUL nodule, ?medial aspect of left superior segment?, mild centrilobular & paraseptal emphysema... ADDENDUM>>  PET scan was performed 12/11/15- 2.7 x 2.0 spiculated nodule in medial RUL is hypermetabolic w/ hypermet lymphadenopathy in the right hilum, no other hypermet lymphadenopathy; 11mm5m  subsolid nodule in medial LLL shows very low grade metabolic activ (OYDXAJ=2.8); no hypermet activ in Abd x enlarged prostate w/ TURP defect... ADDENDUM>>  He had Bronch w/ EBUS 01/11/16 by [name] w/ path c/w a non-small cell lung cancer (favored to be a squamous cell lesion); subsequent MRI Brain 02/04/16 was neg for signs of metastatic dis & MTOC eval & lesion felt to be unresectable Stage IIa non-small cell Ca; seen by [name] & [name] w/ combined weekly ChemoRadiation w/ carboplatin & paclitaxel x 7cycles last 03/14/16, and XRT apparently delivered in [place] by DrPalermo- but there are no  notes in EPIC regarding his XRT treatment...  ADDENDUM>>  Follow up medical oncology appt w/ [name] 04/29/16 w/ CT Chest done 5/17- irreg mass in medial RUL is smaller than prev & assoc peripheral reticular changes likely from his XRT, LLL central 425m ill-defined nodule is unchanged & other comparators likewise unchanged... [name] offered continued observation vs consolidative chemotherapy but his performance status was poor so they decided on f/u CT in 3 months...  ~  May 23, 2016:  786moOV w/ SN>  JoRhianas been diagnosed & treated for a non-small cell lung cancer in his right hilum- see above;  He also developed abd pain/ GIB & [facility] w/ Hg=4.3, required 5u transfusion & eval by GI- acute & chronic gastritis/ duodenitis & ?mass in duodenum but Bx was neg x inflamm (Bx also neg for HPylori but his HPylori Ab test was pos);  He was treated w/ triple therapy & improved, they made the decision to restart his ASA/Plavix at disch due to his CAD & stent; Hg improved to the 9-10 range and this is to be followed up by his PCP- DrNew [place]I reviewed records in Care Everywhere)...     Ryan Contreras continues on his Dulera200- 2spBid but unfortunately is also still smoking despite all he's been through- he declines smoking cessation help, encouraged to try nicotine replacement rx or consider Chantix;  We reviewed the following medical problems during today's office visit >>     Severe airflow obstruction w/ GOLD Stage3 COPD>  He is on Dulera200-2spBid & ProairHFA prn; must quit smoking, stay active...     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    Lung Cancer>  diagnosed & treated 12/2015 for a non-small cell lung cancer in his right hilum; given combined ChemoXRT by [name] & Wentworth=>DrPalmero in [place]; f/u CT Chest by  [name] 04/2016=> he plans f/u CT 25m57mo   On-going tobacco abuse>  He continues to smoke ?several cigs/d; we discussed smoking cessation options...     Cardiac>  HBP, CAD (DrRoss) w/ RCA stent, extensive atherosclerosis in Ao etc; on ASA81, Plavix75 (restarted after 03/2016 GIB), Hyzaar100-12.5, K10.    GI>  [facility] 4/12 - 04/01/16 w/ Hg=4.3, required 5u transfusion & eval by GI- acute & chronic gastritis/ duodenitis & ?mass in duodenum but Bx was neg x inflamm (Bx also neg for HPylori but his HPylori Ab test was pos);  He was treated w/ triple therapy & improved, they made the decision to restart his ASA/Plavix at disch due to his CAD & stent; Hg improved to the 9-10 range and this is to be followed up by his PCP- DrEllison & [facility] (I reviewed records in Care  Everywhere)...     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      EXAM shows Afeb, VSS, O2sat=97% on RA at rest;  HEENT- neg, Mallampati4;  Chest- decr BS bilat & few scat rhonchi, no w/r;  Heart- RR gr1/6 SEM, w/o /r/g heard;  Abd- soft, nontender;  Ext- VI, no c/c/e;  Neuro- no focal deficits...   Imaging studies and LABS from 03/2016- [facility] reviewed in [place]... IMP/PLAN>>  Problems as listed;  Steed needs to quit all smoking & grad increase his exercise program;  Continue Dulera200-2spBid & add MUCINEX 649m tabs- 2Bid w/ fluids;  He will maintain f/u w/ his PCP, Oncologist, & GI;  We plan recheck in 663mosooner if needed for any reason...   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 we reviewed the following medical problems during today's office visit >>     Severe airflow obstruction w/ GOLD Stage3 COPD>  He is on Dulera200-2spBid & ProairHFA prn; must quit smoking, stay active... C/o DOE w/ some ADLs, no change, not exercising, mild cough/ sput (clear, no bl)/ no CP/ etc.    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    Lung Cancer>  diagnosed & treated 12/2015 for a non-small cell lung cancer in right hilum; given combined ChemoXRT by [name] & Wentworth=>DrPalmero in [place]; f/u CT Chest scans by [name]- notes reviewed.    On-going tobacco abuse>  He continues to smoke ?several cigs/d; we discussed smoking cessation options...     Cardiac>  HBP, CAD (DrRoss) w/ RCA stent, extensive atherosclerosis in Ao etc; on ASA81, Plavix75 (restarted after 03/2016 GIB), Hyzaar100-12.5, K10.    GI>  [facility] 4/12 - 04/01/16 w/ Hg=4.3, required 5u transfusion & eval by GI- acute & chronic gastritis/ duodenitis & ?mass in duodenum but Bx was neg x inflamm (Bx also neg for HPylori but his HPylori Ab test was pos);  He was treated w/ triple therapy & improved, they made the decision to restart his ASA/Plavix at disch due to his CAD & stent; Hg improved to the 9-10 range and this is to be followed up by his PCP- DrElFrenchtownreviewed records in Care Everywhere)...     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  CXR 11/22/16>  Norm heart size, calcif in Ao, sl interstitial prom/ scarring in right mid-lung vicinity of prev XRT, COPD, vol loss right base...  CT Chest > pending per [name] IMP/PLAN>>  Reshad has severe chronic problems- followed regularly by [name], DrEllison Harriett Sine;  He would be better from the pulm standpoint if he'd quit all smoking & take his prescribed meds regularly (med non-compliance confirmed w/ call to his Pharm today); we added a LAMA to his regimen- SPIRIVA once daily; advised to take meds regularly, incr exercise program; we plan rov recheck in 64mo..   ~  May 24, 2017:  618moOV & JoMartinezeturns unaccompanied- states his breathing is pretty good, still smoking 1-2 cig/d, notes mild cough w/ small amt beige sput, no hemoptysis, chronic stable SOB/ DOE w/o change, denies CP;  [facility]ator & small O2 tanks-- he uses the Oxygen Qhs ("it helps me sleep") and prn days;  He requests samples of his inhalers raising the spector of medication non-compliance: [facility] [place] he only filled the Symbicort in March & June (one mo supply), and has never filled the Spiriva!      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    He was seen 12/27/16 by DrEllison> note reviewed, wellness visit w/ the following recommendations-- minimize alcohol.  Do not use tobacco products.  Have a colonoscopy at least every 10 years from age 49. 45eep firearms safely stored.  Always use seat belts.  have working smoke alarms in your home.  See an eye doctor and dentist regularly.  Never drive under the influence of alcohol or drugs (including prescription drugs).  It is critically important to prevent falling down (keep floor areas well-lit, dry, and free of loose objects.  If you have a cane, walker, or wheelchair, you should use it, even for short trips around the house.  Wear flat-soled shoes.  Also, try not to rush).      Severe airflow obstruction w/ GOLD Stage3 COPD>  He is supposed to be on Symbicort160-2spBid & Spiriva daily but not filling the Rx regularly; must quit smoking & stay active... c/o DOE w/ some ADLs, no change, not exercising, mild cough/ sput (clear, no bl)/ no CP/ etc.    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    Lung Cancer>  diagnosed & treated 12/2015 for a non-small cell lung cancer in right hilum; given combined ChemoXRT by [name] & Wentworth=>DrPalmero in [place]; f/u CT Chest scans by [name]- notes reviewed.    On-going tobacco abuse>  He continues to smoke ?several cigs/d; we discussed smoking cessation options...     Cardiac>  HBP,  CAD (DrRoss) w/ RCA stent, extensive atherosclerosis in Ao etc; on ASA81, Plavix75 (restarted after 03/2016 GIB), Hyzaar100-12.5, K10.    GI>  [facility] 4/12 - 04/01/16 w/ Hg=4.3, required 5u transfusion & eval by GI- acute & chronic gastritis/ duodenitis & ?mass in duodenum but Bx was neg x inflamm (Bx also neg for HPylori but his HPylori Ab test was pos);  He was treated w/ triple therapy &  improved, they made the decision to restart his ASA/Plavix at disch due to his CAD & stent; Hg improved to the 9-10 range and this is to be followed up by his PCP- [place] (I reviewed records in Care Everywhere)...     Medical>  PCP is DrEllison> HL on Lip80, BPH w/ BOO, Elev PSA (Eskridge), Gout on Allopurinol300, & [facility] to [facility]/2016 w/ GIB- gastitis, duodenitis, ?duodenal mass, +HPylori Ab test (neg Bx) and treated w/ triple therapy & improved (now taking Protonix40, Zantac150, Senakot-S); Anemia- on FeSO4...      EXAM shows Afeb, VSS, O2sat=95% on RA at rest;  HEENT- neg, Mallampati4;  Chest- decr BS bilat & few scat rhonchi, no w/r;  Heart- RR gr1/6 SEM, w/o /r/g heard;  Abd- soft, nontender;  Ext- VI, no c/c/e;  Neuro- non-focal.  Ambulatory oximetry 05/24/17>  O2sat=92% w/ pulse=86/min;  He ambulated 2 Laps in office (185'ea) & stopped w/ lowest O2sat=87% w/ pulse=105/min...  LABS 05/24/17>  Chems- HCO3=38 (pt off Diamox now), K=3.9, BS=112, Cr=0.81;  CBC- wnl;  Sed=40 IMP/PLAN>>  Yashua is his own worse enemy-- must quit all smoking, must take meds and inhalers regularly, and get on a good exercise program;  He needs his port O2 when ambulating;  Re-start the Symbicort/ Spiriva/ Diamox230m/d...    Past Medical History  Diagnosis Date   COPD w/ severe airflow obstruction 7 GOLD Stage 3 COPD> on Dulera200-2spBid    Lung Cancer> right hilar mass found on low dose screening CT 11/2015, Bx= non small cell Ca (SCCa favored) & treated w/ chemoradiation completed 03/2016...   . CAD, NATIVE VESSEL >> followed by DrRoss, CATH 05/2012= 60%LAD, 40%D1, 40%Circ, 40%RCA, EF=55% w/ min inferobasal HK... He has Hx PTA/stent in RCA   . Gout, unspecified   . HYPERLIPIDEMIA   . HYPERTENSION   . PEPTIC ULCER DISEASE           [facility], Hg=4.3, required 5u Tx, eval w/ gastitis & duodenitis w/ ?duodenal mass- neg Bx (+HPylori Ab test); treated w/ triple therapy & improved...   . TOBACCO  ABUSE   . Type 2 diabetes mellitus (HCC)    ANEMIA> acute GI blood loss anemia 03/2016 => on Fe     Past Surgical History:  Procedure Laterality Date  . CARDIAC CATHETERIZATION     '06-stent placed, 6'13 - Dr. PLizbeth Barkfollows  . CORONARY STENT PLACEMENT     s/p  . ENDOBRONCHIAL ULTRASOUND Bilateral 01/11/2016   Procedure: ENDOBRONCHIAL ULTRASOUND;  Surgeon: RCollene Gobble MD;  Location: [facility] ENDOSCOPY;  Service: Endoscopy;  Laterality: Bilateral;    Outpatient Encounter Prescriptions as of 05/24/2017  Medication Sig  . acetaminophen (TYLENOL) 500 MG tablet Take 250 mg by mouth every 6 (six) hours as needed for moderate pain. Reported on 04/29/2016  . allopurinol (ZYLOPRIM) 100 MG tablet TAKE 1 TABLET BY MOUTH EVERY DAY  . aspirin 81 MG tablet Take 81 mg by mouth at bedtime. Reported on 05/02/2016  . atorvastatin (LIPITOR) 80 MG tablet TAKE 1 TABLET BY  MOUTH DAILY.  . budesonide-formoterol (SYMBICORT) 160-4.5 MCG/ACT inhaler Inhale 2 puffs into the lungs 2 (two) times daily.  . clopidogrel (PLAVIX) 75 MG tablet TAKE 1 TABLET BY MOUTH EVERY DAY  . losartan-hydrochlorothiazide (HYZAAR) 100-12.5 MG tablet Take 1 tablet by mouth daily.  . pantoprazole (PROTONIX) 40 MG tablet TAKE 1 TABLET BY MOUTH EVERY DAY  . senna-docusate (SENOKOT-S) 8.6-50 MG tablet Take 1 tablet by mouth at bedtime.   Marland Kitchen acetaZOLAMIDE (DIAMOX) 250 MG tablet Take 1 tablet (250 mg total) by mouth daily. (Patient not taking: Reported on 05/24/2017)  . budesonide-formoterol (SYMBICORT) 160-4.5 MCG/ACT inhaler Inhale 2 puffs into the lungs 2 (two) times daily.  . ferrous sulfate 325 (65 FE) MG tablet Take 325 mg by mouth daily with breakfast.  . KLOR-CON M10 10 MEQ tablet Take 10 mEq by mouth daily.   Marland Kitchen KLOR-CON M10 10 MEQ tablet TAKE 1 TABLET (10 MEQ TOTAL) BY MOUTH DAILY. (Patient not taking: Reported on 05/24/2017)  . ranitidine (ZANTAC) 150 MG tablet Take 150 mg by mouth daily.  . sildenafil (VIAGRA) 100 MG tablet Take 1 tablet  (100 mg total) by mouth as needed for erectile dysfunction. (Patient not taking: Reported on 05/24/2017)  . [DISCONTINUED] allopurinol (ZYLOPRIM) 100 MG tablet Take 100 mg by mouth daily.  Marland Kitchen  tiotropium (SPIRIVA) 18 MCG inhalation capsule Place 1 capsule (18 mcg total) into inhaler and inhale daily. (Patient not taking: Reported on 05/24/2017)   No facility-administered encounter medications on file as of 05/24/2017.     No Known Allergies   Immunization History  Administered Date(s) Administered  . Influenza Whole 08/11/2010, 10/10/2012  . Influenza,inj,Quad PF,36+ Mos 09/11/2013  . Influenza-Unspecified 09/11/2014, 09/14/2015, 09/30/2016  . Pneumococcal Conjugate-13 06/03/2009  . Pneumococcal Polysaccharide-23 08/11/2010  . Td 12/13/2002    Current Medications, Allergies, Past Medical History, Past Surgical History, Family History, and Social History were reviewed in [employer] record.   Review of Systems  Constitutional: Negative for fever and unexpected weight change.  HENT: Negative for congestion, dental problem, ear pain, nosebleeds, postnasal drip, rhinorrhea, sinus pressure, sneezing, sore throat and trouble swallowing.  Eyes: Negative for redness and itching.  Respiratory: Positive for cough and no wheezing. Negative for chest tightness and shortness of breath.  Cardiovascular: Negative for palpitations and leg swelling.  Gastrointestinal: Negative for nausea and vomiting.  Genitourinary: Negative for dysuria.  Musculoskeletal: Negative for joint swelling.  Skin: Negative for rash.  Neurological: Negative for headaches.  Hematological: Does not bruise/bleed easily.  Psychiatric/Behavioral: Negative for dysphoric mood. The patient is not nervous/anxious.    Objective:   Physical Exam  76 y/o gentleman, chr ill appearing, in NAD...  Nose without purulence or discharge noted Neck without lymphadenopathy or thyromegaly Chest with decreased  breath sounds, no active wheezing Cardiac exam with regular rate and rhythm Lower extremities without edema, no cyanosis Alert and oriented, moves all 4 extremities.   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    LUNG CANCER>  Non-small cell ca Dx 12/2015 & treated by [name] & XRT    On-going tobacco abuse>  He continues to  smoke ?several cigs/d; we discussed smoking cessation options...     Cardiac>  HBP, CAD (DrRoss) w/ RCA stent, extensive atherosclerosis in Ao etc; on ASA81, Plavix75 (restarted after 03/2016 GIB), Hyzaar100-12.5, K10.    Medical>  HL on Lip80, BPH w/ BOO, Elev PSA (Eskridge), Gout on Allopurinol300, & ADM to [facility] 03/2016 w/ GIB- gastitis, duodenitis, ?duodenal mass, +HPylori Ab test (neg Bx) and treated w/ triple therapy & improved (now taking Protonix40, Zantac150, Senakot-S); Anemia- on FeSO4...  PLAN >>  11/02/15>   Jakell desperately needs to quit smoking- discussed Nicotine replacement options and offered Chantix;  Breathing meds refilled;  I rec that he STOP the Lasix for now & substitute ACETAZOLAMIDE 237m one tab daily in the afternoon;  He has already had the 2016 Flu vaccine;  Plan ROV in 632moooner if needed prn. 05/23/16>   Problems as listed;  Nisaiah needs to quit all smoking & grad increase his exercise program;  Continue Dulera200-2spBid & add MUCINEX 60053mabs- 2Bid w/ fluids;  He will maintain f/u w/ his PCP, Oncologist, & GI;  We plan recheck in 63mo763mooner if needed for any reason...  11/22/16>   Amarius has severe chronic problems- followed regularly by [name], DrEllison etalHarriett Sinee would be better from the pulm standpoint if he'd quit all smoking  & take his prescribed meds regularly (med non-compliance confirmed w/ call to his Pharm today); advised to take meds regularly, incr exercise program; we plan rov recheck in 63mo.563mo13/18>    Jonnathan is his own worse enemy-- must quit all smoking, must take meds and inhalers regularly, and get on a good exercise program;  He needs his port O2 when ambulating;  Re-start the Symbicort/ Spiriva/ Diamox250mg/40m   Plan:     Patient's Medications  New Prescriptions   BUDESONIDE-FORMOTEROL (SYMBICORT) 160-4.5 MCG/ACT INHALER    Inhale 2 puffs into the lungs 2 (two) times daily.   UMECLIDINIUM BROMIDE (INCRUSE ELLIPTA) 62.5 MCG/INH AEPB    Inhale 1 puff into the lungs daily.  Previous Medications   ACETAMINOPHEN (TYLENOL) 500 MG TABLET    Take 250 mg by mouth every 6 (six) hours as needed for moderate pain. Reported on 04/29/2016   ACETAZOLAMIDE (DIAMOX) 250 MG TABLET    Take 1 tablet (250 mg total) by mouth daily.   ALLOPURINOL (ZYLOPRIM) 100 MG TABLET    TAKE 1 TABLET BY MOUTH EVERY DAY   ASPIRIN 81 MG TABLET    Take 81 mg by mouth at bedtime. Reported on 05/02/2016   ATORVASTATIN (LIPITOR) 80 MG TABLET    TAKE 1 TABLET BY MOUTH DAILY.   BUDESONIDE-FORMOTEROL (SYMBICORT) 160-4.5 MCG/ACT INHALER    Inhale 2 puffs into the lungs 2 (two) times daily.   CLOPIDOGREL (PLAVIX) 75 MG TABLET    TAKE 1 TABLET BY MOUTH EVERY DAY   FERROUS SULFATE 325 (65 FE) MG TABLET    Take 325 mg by mouth daily with breakfast.   KLOR-CON M10 10 MEQ TABLET    Take 10 mEq by mouth daily.    KLOR-CON M10 10 MEQ TABLET    TAKE 1 TABLET (10 MEQ TOTAL) BY MOUTH DAILY.   LOSARTAN-HYDROCHLOROTHIAZIDE (HYZAAR) 100-12.5 MG TABLET    Take 1 tablet by mouth daily.   PANTOPRAZOLE (PROTONIX) 40 MG TABLET    TAKE 1 TABLET BY MOUTH EVERY DAY   RANITIDINE (ZANTAC) 150 MG TABLET    Take 150 mg by mouth daily.   SENNA-DOCUSATE (SENOKOT-S) 8.6-50 MG TABLET  Take 1 tablet by mouth at bedtime.    SILDENAFIL (VIAGRA) 100 MG TABLET    Take 1  tablet (100 mg total) by mouth as needed for erectile dysfunction.  Modified Medications   No medications on file  Discontinued Medications   ALLOPURINOL (ZYLOPRIM) 100 MG TABLET    Take 100 mg by mouth daily.   BUDESONIDE-FORMOTEROL (SYMBICORT) 160-4.5 MCG/ACT INHALER    Inhale 2 puffs into the lungs 2 (two) times daily.   TIOTROPIUM (SPIRIVA) 18 MCG INHALATION CAPSULE    Place 1 capsule (18 mcg total) into inhaler and inhale daily.

## 2017-05-25 ENCOUNTER — Other Ambulatory Visit: Payer: Self-pay | Admitting: Pulmonary Disease

## 2017-05-25 MED ORDER — ACETAZOLAMIDE 250 MG PO TABS: 250.0000 mg | ORAL_TABLET | Freq: Every day | ORAL | 11 refills | Status: DC

## 2017-05-25 NOTE — Progress Notes (Signed)
Spoke with pt and notified of results per Dr. Wert. Pt verbalized understanding and denied any questions. 

## 2017-05-29 ENCOUNTER — Telehealth: Payer: Self-pay | Admitting: Pulmonary Disease

## 2017-05-29 NOTE — Telephone Encounter (Signed)
Spoke with pt and went over lab results again with him. Pt already spoke with another nurse on 05/25/17 and rx was sent in.  Pt did have another question.  SN  Please Advise-  Pt wanted to know exactly why would being off the Diamox cause his bicarb level to be elevated.

## 2017-05-30 ENCOUNTER — Other Ambulatory Visit: Payer: Self-pay | Admitting: Internal Medicine

## 2017-05-30 NOTE — Telephone Encounter (Signed)
Per SN---  Diamox is a diuretic whos effect is to lower the bicarb level in the blood and help correct this metabolic abnormality.    If he is not taking this medication, then the bicarb level can increase and cause issues with his COPD (from smoking).

## 2017-05-30 NOTE — Telephone Encounter (Signed)
Patient returned call, CB is [phone].

## 2017-05-30 NOTE — Telephone Encounter (Signed)
Spoke with pt and informed him of the message from SN. Pt understood and had no further questions at this time. Nothing further is needed

## 2017-05-30 NOTE — Telephone Encounter (Signed)
Left message for patient to call back  

## 2017-05-31 ENCOUNTER — Telehealth: Payer: Self-pay | Admitting: Pulmonary Disease

## 2017-05-31 NOTE — Telephone Encounter (Signed)
Spoke with patient. He stated that he went to the pharmacy yesterday to pickup a medication that SN had called in for him, the pharmacy called it a different name that he wasn't familiar with.  Patient spelled the medication and advised him that was the generic of Diamox.  Patient verbalized understanding.

## 2017-06-02 ENCOUNTER — Other Ambulatory Visit: Payer: Self-pay | Admitting: Endocrinology

## 2017-06-12 ENCOUNTER — Telehealth: Payer: Self-pay | Admitting: Pulmonary Disease

## 2017-06-12 NOTE — Telephone Encounter (Signed)
Spoke with pt, aware of recs.  Pt states he cannot schedule a follow up at this time, wants to call back to schedule.  Will await call back.

## 2017-06-12 NOTE — Telephone Encounter (Signed)
No problem leaving it off until next ov with Dr Lenna Gilford > move up to 4-6 weeks

## 2017-06-12 NOTE — Telephone Encounter (Signed)
Pt states that Diamox is making him feel drowsy.  Pt denies fever, chest pain, dizziness, leg cramps.  Pt does not wish to take this medication any longer, requesting an alternative.  I've advised pt to d/c this medication.  Pt expressed understanding. Pt uses [facility] in [place].  Sending to DOD as SN is gone for the afternoon.  MW please advise on further recs.  Thanks.      05/24/17 AVS: Instructions     Return in about 6 months (around 11/23/2017).  Patient Instructions   Today we updated your med list in our EPIC system...    Continue your current medications the same...   Today we checked an ambulatory oximetry test... We also did some follow up blood work...    We will contact you w/ the results when available...    YOU NEED TO QUIT THE LAST OF THOSE NASTY CIGARETTES!!!   Use the SYMBICORT160- 2 sprays twice daily...    And the new INCRUSE one inhalation once daily...       TAKE BOTH OF THESE INHALERS REGULARLY EVERY DAY!   Be sure to keep your follow up appt w/ [name]...   Call for any questions...   Let's plan a follow up visit in 2mo, sooner if needed for problems.Marland KitchenMarland Kitchen

## 2017-06-13 ENCOUNTER — Telehealth: Payer: Self-pay | Admitting: Internal Medicine

## 2017-06-13 NOTE — Telephone Encounter (Signed)
Returned call to pt regarding request for for lab/CT /MD on the same day. Discussed with pt I will send a request for changes however  It could push pt's appoint for CT or MD out to a later date. Pt declined, states I will just keep all my appts. No changes will be made at this time.

## 2017-06-13 NOTE — Telephone Encounter (Signed)
Patient would like to have a later appointment with Dr Julien Nordmann so that he can do his lab ct and dr appointments all in the same day. If possible.  Patient lives out of town

## 2017-06-15 NOTE — Telephone Encounter (Signed)
Waiting on call back from pt to reschedule his appt with SN.

## 2017-06-16 ENCOUNTER — Other Ambulatory Visit (HOSPITAL_BASED_OUTPATIENT_CLINIC_OR_DEPARTMENT_OTHER): Payer: Medicare Other

## 2017-06-16 DIAGNOSIS — C3491 Malignant neoplasm of unspecified part of right bronchus or lung: Secondary | ICD-10-CM

## 2017-06-16 DIAGNOSIS — C3411 Malignant neoplasm of upper lobe, right bronchus or lung: Secondary | ICD-10-CM | POA: Diagnosis not present

## 2017-06-16 LAB — COMPREHENSIVE METABOLIC PANEL
ALT: 15 U/L (ref 0–55)
ANION GAP: 8 meq/L (ref 3–11)
AST: 21 U/L (ref 5–34)
Albumin: 3.6 g/dL (ref 3.5–5.0)
Alkaline Phosphatase: 87 U/L (ref 40–150)
BUN: 10.3 mg/dL (ref 7.0–26.0)
CHLORIDE: 98 meq/L (ref 98–109)
CO2: 35 meq/L — AB (ref 22–29)
Calcium: 9.3 mg/dL (ref 8.4–10.4)
Creatinine: 0.8 mg/dL (ref 0.7–1.3)
GLUCOSE: 128 mg/dL (ref 70–140)
Potassium: 3.4 mEq/L — ABNORMAL LOW (ref 3.5–5.1)
Sodium: 141 mEq/L (ref 136–145)
TOTAL PROTEIN: 7.2 g/dL (ref 6.4–8.3)
Total Bilirubin: 0.46 mg/dL (ref 0.20–1.20)

## 2017-06-16 LAB — CBC WITH DIFFERENTIAL/PLATELET
BASO%: 0.8 % (ref 0.0–2.0)
Basophils Absolute: 0.1 10*3/uL (ref 0.0–0.1)
EOS ABS: 0.2 10*3/uL (ref 0.0–0.5)
EOS%: 2.3 % (ref 0.0–7.0)
HCT: 41.1 % (ref 38.4–49.9)
HGB: 13.5 g/dL (ref 13.0–17.1)
LYMPH%: 24.6 % (ref 14.0–49.0)
MCH: 32.2 pg (ref 27.2–33.4)
MCHC: 32.9 g/dL (ref 32.0–36.0)
MCV: 98 fL (ref 79.3–98.0)
MONO#: 1.1 10*3/uL — AB (ref 0.1–0.9)
MONO%: 13.9 % (ref 0.0–14.0)
NEUT%: 58.4 % (ref 39.0–75.0)
NEUTROS ABS: 4.7 10*3/uL (ref 1.5–6.5)
PLATELETS: 246 10*3/uL (ref 140–400)
RBC: 4.19 10*6/uL — AB (ref 4.20–5.82)
RDW: 15 % — ABNORMAL HIGH (ref 11.0–14.6)
WBC: 8 10*3/uL (ref 4.0–10.3)
lymph#: 2 10*3/uL (ref 0.9–3.3)

## 2017-06-19 ENCOUNTER — Telehealth: Payer: Self-pay | Admitting: Internal Medicine

## 2017-06-19 ENCOUNTER — Ambulatory Visit (HOSPITAL_BASED_OUTPATIENT_CLINIC_OR_DEPARTMENT_OTHER): Payer: Medicare Other | Admitting: Internal Medicine

## 2017-06-19 ENCOUNTER — Encounter: Payer: Self-pay | Admitting: Internal Medicine

## 2017-06-19 ENCOUNTER — Ambulatory Visit (HOSPITAL_COMMUNITY)
Admission: RE | Admit: 2017-06-19 | Discharge: 2017-06-19 | Disposition: A | Discharge status: Home / Self Care | Payer: Medicare Other | Source: Ambulatory Visit | Attending: Internal Medicine | Admitting: Internal Medicine

## 2017-06-19 ENCOUNTER — Encounter (HOSPITAL_COMMUNITY): Payer: Self-pay

## 2017-06-19 VITALS — BP 147/68 | HR 90 | Temp 98.5°F | Resp 18 | Ht 73.0 in | Wt 196.5 lb

## 2017-06-19 DIAGNOSIS — Z720 Tobacco use: Secondary | ICD-10-CM | POA: Diagnosis not present

## 2017-06-19 DIAGNOSIS — C3491 Malignant neoplasm of unspecified part of right bronchus or lung: Secondary | ICD-10-CM | POA: Diagnosis not present

## 2017-06-19 DIAGNOSIS — C3411 Malignant neoplasm of upper lobe, right bronchus or lung: Secondary | ICD-10-CM | POA: Diagnosis not present

## 2017-06-19 DIAGNOSIS — R918 Other nonspecific abnormal finding of lung field: Secondary | ICD-10-CM | POA: Diagnosis not present

## 2017-06-19 DIAGNOSIS — K769 Liver disease, unspecified: Secondary | ICD-10-CM | POA: Diagnosis not present

## 2017-06-19 HISTORY — DX: Malignant (primary) neoplasm, unspecified: C80.1

## 2017-06-19 MED ORDER — IOPAMIDOL (ISOVUE-300) INJECTION 61%
INTRAVENOUS | Status: AC
  Filled 2017-06-19: qty 75

## 2017-06-19 MED ORDER — IOPAMIDOL (ISOVUE-300) INJECTION 61%
75.0000 mL | Freq: Once | INTRAVENOUS | Status: AC | PRN
  Administered 2017-06-19: 75 mL via INTRAVENOUS

## 2017-06-19 NOTE — Patient Instructions (Signed)
Steps to Quit Smoking Smoking tobacco can be bad for your health. It can also affect almost every organ in your body. Smoking puts you and people around you at risk for many serious long-lasting (chronic) diseases. Quitting smoking is hard, but it is one of the best things that you can do for your health. It is never too late to quit. What are the benefits of quitting smoking? When you quit smoking, you lower your risk for getting serious diseases and conditions. They can include:  Lung cancer or lung disease.  Heart disease.  Stroke.  Heart attack.  Not being able to have children (infertility).  Weak bones (osteoporosis) and broken bones (fractures).  If you have coughing, wheezing, and shortness of breath, those symptoms may get better when you quit. You may also get sick less often. If you are pregnant, quitting smoking can help to lower your chances of having a baby of low birth weight. What can I do to help me quit smoking? Talk with your doctor about what can help you quit smoking. Some things you can do (strategies) include:  Quitting smoking totally, instead of slowly cutting back how much you smoke over a period of time.  Going to in-person counseling. You are more likely to quit if you go to many counseling sessions.  Using resources and support systems, such as: ? Online chats with a counselor. ? Phone quitlines. ? Printed self-help materials. ? Support groups or group counseling. ? Text messaging programs. ? Mobile phone apps or applications.  Taking medicines. Some of these medicines may have nicotine in them. If you are pregnant or breastfeeding, do not take any medicines to quit smoking unless your doctor says it is okay. Talk with your doctor about counseling or other things that can help you.  Talk with your doctor about using more than one strategy at the same time, such as taking medicines while you are also going to in-person counseling. This can help make  quitting easier. What things can I do to make it easier to quit? Quitting smoking might feel very hard at first, but there is a lot that you can do to make it easier. Take these steps:  Talk to your family and friends. Ask them to support and encourage you.  Call phone quitlines, reach out to support groups, or work with a counselor.  Ask people who smoke to not smoke around you.  Avoid places that make you want (trigger) to smoke, such as: ? Bars. ? Parties. ? Smoke-break areas at work.  Spend time with people who do not smoke.  Lower the stress in your life. Stress can make you want to smoke. Try these things to help your stress: ? Getting regular exercise. ? Deep-breathing exercises. ? Yoga. ? Meditating. ? Doing a body scan. To do this, close your eyes, focus on one area of your body at a time from head to toe, and notice which parts of your body are tense. Try to relax the muscles in those areas.  Download or buy apps on your mobile phone or tablet that can help you stick to your quit plan. There are many free apps, such as QuitGuide from the CDC (Centers for Disease Control and Prevention). You can find more support from smokefree.gov and other websites.  This information is not intended to replace advice given to you by your health care provider. Make sure you discuss any questions you have with your health care provider. Document Released: 09/24/2009 Document   Revised: 07/26/2016 Document Reviewed: 04/14/2015 Elsevier Interactive Patient Education  2018 Elsevier Inc.  

## 2017-06-19 NOTE — Progress Notes (Signed)
[place] Telephone:([phone]) [phone]   Fax:([phone]) [phone]  OFFICE PROGRESS NOTE  Renato Shin, MD [address] [place] [address]  DIAGNOSIS: Unresectable Stage IIA (T1b, N1, M0) non-small cell lung cancer, squamous cell carcinoma presented with right upper lobe lung nodule and hilar lymphadenopathy diagnosed in January 2017.  PRIOR THERAPY: Course of concurrent chemoradiation with weekly carboplatin for AUC of 2 and paclitaxel 45 MG/M2. Status post 7 cycles, last dose was given 03/14/2016 with partial response.  CURRENT THERAPY: Observation.  INTERVAL HISTORY: Ryan Contreras 76 y.o. male returns to the clinic today for follow-up visit accompanied by his wife. The patient is feeling fine today with no specific complaints except for shortness of breath with exertion. He denied having any chest pain, cough or hemoptysis. He denied having any fever or chills. He has no nausea, vomiting, diarrhea or constipation. He denied having any significant weight loss or night sweats. He had repeat CT scan of the chest performed recently and he is here for evaluation and discussion of his scan results.    MEDICAL HISTORY: Past Medical History:  Diagnosis Date  . CAD, NATIVE VESSEL    coronary stent x1-tx. Plavix use.  Marland Kitchen COPD (chronic obstructive pulmonary disease) ([place])    due to 50+ years Smoking.  . Gastrointestinal bleeding, upper 04/29/2016  . Gout, unspecified    foot  . HYPERLIPIDEMIA   . HYPERTENSION   . NSCL ca dx'd 12/2015  . PEPTIC ULCER DISEASE   . TOBACCO ABUSE   . Type 2 diabetes mellitus ([place])    denies on 01-06-16    ALLERGIES:  has No Known Allergies.  MEDICATIONS:  Current Outpatient Prescriptions  Medication Sig Dispense Refill  . acetaminophen (TYLENOL) 500 MG tablet Take 250 mg by mouth every 6 (six) hours as needed for moderate pain. Reported on 04/29/2016    . acetaZOLAMIDE (DIAMOX) 250 MG tablet Take 1 tablet (250 mg total) by  mouth daily. 30 tablet 11  . allopurinol (ZYLOPRIM) 100 MG tablet TAKE 1 TABLET BY MOUTH EVERY DAY 90 tablet 3  . aspirin 81 MG tablet Take 81 mg by mouth at bedtime. Reported on 05/02/2016    . atorvastatin (LIPITOR) 80 MG tablet TAKE 1 TABLET BY MOUTH EVERY DAY 30 tablet 0  . budesonide-formoterol (SYMBICORT) 160-4.5 MCG/ACT inhaler Inhale 2 puffs into the lungs 2 (two) times daily. 1 Inhaler 6  . budesonide-formoterol (SYMBICORT) 160-4.5 MCG/ACT inhaler Inhale 2 puffs into the lungs 2 (two) times daily. 2 Inhaler 0  . clopidogrel (PLAVIX) 75 MG tablet TAKE 1 TABLET BY MOUTH EVERY DAY 90 tablet 2  . ferrous sulfate 325 (65 FE) MG tablet Take 325 mg by mouth daily with breakfast.    . KLOR-CON M10 10 MEQ tablet Take 10 mEq by mouth daily.     Marland Kitchen KLOR-CON M10 10 MEQ tablet TAKE 1 TABLET (10 MEQ TOTAL) BY MOUTH DAILY. (Patient not taking: Reported on 05/24/2017) 90 tablet 2  . losartan-hydrochlorothiazide (HYZAAR) 100-12.5 MG tablet TAKE 1 TABLET BY MOUTH DAILY. 90 tablet 2  . pantoprazole (PROTONIX) 40 MG tablet TAKE 1 TABLET BY MOUTH EVERY DAY 90 tablet 3  . ranitidine (ZANTAC) 150 MG tablet Take 150 mg by mouth daily.  1  . senna-docusate (SENOKOT-S) 8.6-50 MG tablet Take 1 tablet by mouth at bedtime.     . sildenafil (VIAGRA) 100 MG tablet Take 1 tablet (100 mg total) by mouth as needed for erectile dysfunction. (  Patient not taking: Reported on 05/24/2017) 1 tablet 4  . umeclidinium bromide (INCRUSE ELLIPTA) 62.5 MCG/INH AEPB Inhale 1 puff into the lungs daily. 2 each 0   No current facility-administered medications for this visit.    Facility-Administered Medications Ordered in Other Visits  Medication Dose Route Frequency Provider Last Rate Last Dose  . iopamidol (ISOVUE-300) 61 % injection             SURGICAL HISTORY:  Past Surgical History:  Procedure Laterality Date  . CARDIAC CATHETERIZATION     '06-stent placed, 6'13 - Dr. Lizbeth Bark follows  . CORONARY STENT PLACEMENT     s/p  .  ENDOBRONCHIAL ULTRASOUND Bilateral 01/11/2016   Procedure: ENDOBRONCHIAL ULTRASOUND;  Surgeon: Collene Gobble, MD;  Location: [facility];  Service: Endoscopy;  Laterality: Bilateral;    REVIEW OF SYSTEMS:  A comprehensive review of systems was negative except for: Respiratory: positive for dyspnea on exertion   PHYSICAL EXAMINATION: General appearance: alert, cooperative, fatigued and no distress Head: Normocephalic, without obvious abnormality, atraumatic Neck: no adenopathy, no JVD, supple, symmetrical, trachea midline and thyroid not enlarged, symmetric, no tenderness/mass/nodules Lymph nodes: Cervical, supraclavicular, and axillary nodes normal. Resp: clear to auscultation bilaterally Back: symmetric, no curvature. ROM normal. No CVA tenderness. Cardio: regular rate and rhythm, S1, S2 normal, no murmur, click, rub or gallop GI: soft, non-tender; bowel sounds normal; no masses,  no organomegaly Extremities: extremities normal, atraumatic, no cyanosis or edema  ECOG PERFORMANCE STATUS: 1 - Symptomatic but completely ambulatory  Blood pressure (!) 147/68, pulse 90, temperature 98.5 F (36.9 C), temperature source Oral, resp. rate 18, height 6\' 1"  (1.854 m), weight 196 lb 8 oz (89.1 kg), SpO2 94 %.  LABORATORY DATA: Lab Results  Component Value Date   WBC 8.0 06/16/2017   HGB 13.5 06/16/2017   HCT 41.1 06/16/2017   MCV 98.0 06/16/2017   PLT 246 06/16/2017      Chemistry      Component Value Date/Time   NA 141 06/16/2017 0906   K 3.4 (L) 06/16/2017 0906   CL 99 05/24/2017 1033   CO2 35 (H) 06/16/2017 0906   BUN 10.3 06/16/2017 0906   CREATININE 0.8 06/16/2017 0906      Component Value Date/Time   CALCIUM 9.3 06/16/2017 0906   ALKPHOS 87 06/16/2017 0906   AST 21 06/16/2017 0906   ALT 15 06/16/2017 0906   BILITOT 0.46 06/16/2017 0906       RADIOGRAPHIC STUDIES: Ct Chest W Contrast  Result Date: 06/19/2017 CLINICAL DATA:  Non-small cell lung cancer, chemotherapy  complete 03/2017 EXAM: CT CHEST WITH CONTRAST TECHNIQUE: Multidetector CT imaging of the chest was performed during intravenous contrast administration. CONTRAST:  45mL ISOVUE-300 IOPAMIDOL (ISOVUE-300) INJECTION 61% COMPARISON:  CT 11/30/2016 FINDINGS: Cardiovascular: Coronary artery calcification and aortic atherosclerotic calcification. Mediastinum/Nodes: No axillary supraclavicular adenopathy. Subcutaneous fluid density spherical structure in the mid chest superficial to the manubrium is not changed from prior measuring 3 cm. Favored benign cystic lesion. No mediastinal hilar adenopathy. No pericardial fluid. Esophagus normal. Lungs/Pleura: Perihilar thickening in the RIGHT lung is similar prior. No discrete nodularity. Linear thickening measured on comparison exam is less conspicuous measuring 16 by 7 mm compared with 19 x 8 mm. Difficult to separate this linear thickening from adjacent vessel. Within the superior segment of the LEFT lower lobe nodular thickening associated with cluster of cystic change measures 9 mm x 15 mm. This compares with 12 mm x 10 mm on CT of 08/01/2016 for  no significant change. Lesions increased in size from 04/27/2016. This lesion was not significant hypermetabolic on PET-CT of 58/08/9832 and similar size. Upper Abdomen: Limited view of the liver, kidneys, pancreas are unremarkable. Normal adrenal glands. Low-density lesion seen in the posterior aspect of the RIGHT hepatic lobe measuring 4 cm is unchanged from comparison. This lesion was not hypermetabolic on comparison PET-CT scan. Liver has a nodular contour. Musculoskeletal: No aggressive osseous lesion. IMPRESSION: 1. Decrease in nodular thickening about the RIGHT hilum. No new nodularity the RIGHT lung. 2. Stable nodular cystic changes in the superior segment LEFT lower lobe. Lesion relatively stable compared to PET-CT of 12/11/2015. 3. Benign lesion posterior aspect of the RIGHT hepatic lobe. Liver has a nodular contour.  Electronically Signed   By: Suzy Bouchard M.D.   On: 06/19/2017 08:45    ASSESSMENT AND PLAN:  This is a very pleasant 76 years old African-American male with unresectable a stage II a non-small cell lung cancer, squamous cell carcinoma status post a course of concurrent chemoradiation. The patient is currently on observation. He had repeat CT scan of the chest performed recently that showed no evidence for disease progression. I discussed the scan results with the patient and his wife today. I recommended for him to continue on observation with repeat CT scan of the chest in 6 months. For smoking cessation, I strongly encouraged the patient to quit smoking and offered him smoke cessation program. The patient was advised to call immediately if he has any concerning symptoms in the interval. The patient voices understanding of current disease status and treatment options and is in agreement with the current care plan. All questions were answered. The patient knows to call the clinic with any problems, questions or concerns. We can certainly see the patient much sooner if necessary. I spent 10 minutes counseling the patient face to face. The total time spent in the appointment was 15 minutes.  Disclaimer: This note was dictated with voice recognition software. Similar sounding words can inadvertently be transcribed and may be missed upon review.

## 2017-06-19 NOTE — Telephone Encounter (Signed)
Scheduled appt per 7/9 los - Gave patient AVS and calender per los.

## 2017-06-19 NOTE — Telephone Encounter (Signed)
ATC pt phone just rung, will need to call back to assure he gets a follow up appt with SN

## 2017-06-21 NOTE — Telephone Encounter (Signed)
Pt coming in to see Dr Lenna Gilford 07/19/17 at 10am Pt advised again of rec's per MW regarding the Diamox Nothing further needed.

## 2017-06-26 ENCOUNTER — Encounter: Payer: Self-pay | Admitting: Endocrinology

## 2017-06-26 ENCOUNTER — Ambulatory Visit (INDEPENDENT_AMBULATORY_CARE_PROVIDER_SITE_OTHER): Payer: Medicare Other | Admitting: Endocrinology

## 2017-06-26 VITALS — BP 122/62 | HR 105 | Ht 73.0 in | Wt 197.0 lb

## 2017-06-26 DIAGNOSIS — E1159 Type 2 diabetes mellitus with other circulatory complications: Secondary | ICD-10-CM | POA: Diagnosis not present

## 2017-06-26 DIAGNOSIS — Z0000 Encounter for general adult medical examination without abnormal findings: Secondary | ICD-10-CM

## 2017-06-26 DIAGNOSIS — R413 Other amnesia: Secondary | ICD-10-CM | POA: Diagnosis not present

## 2017-06-26 LAB — POCT GLYCOSYLATED HEMOGLOBIN (HGB A1C): Hemoglobin A1C: 6.3

## 2017-06-26 MED ORDER — DONEPEZIL HCL 5 MG PO TABS: 5.0000 mg | ORAL_TABLET | Freq: Every day | ORAL | 11 refills | Status: DC

## 2017-06-26 MED ORDER — POTASSIUM CHLORIDE CRYS ER 20 MEQ PO TBCR: 20.0000 meq | EXTENDED_RELEASE_TABLET | Freq: Every day | ORAL | 11 refills | Status: DC

## 2017-06-26 NOTE — Patient Instructions (Addendum)
No medication is needed for the diabetes now.  I have sent a prescription to your pharmacy, to increase the potassium pill to a 20 size.  good diet and exercise significantly improve the control of your diabetes.  please let me know if you wish to be referred to a dietician.  high blood sugar is very risky to your health.  you should see an eye doctor and dentist every year.  It is very important to get all recommended vaccinations.   Please consider these measures for your health:  minimize alcohol.  Do not use tobacco products.  Have a colonoscopy at least every 10 years from age 84.  Women should have an annual mammogram from age 66.  Keep firearms safely stored.  Always use seat belts.  have working smoke alarms in your home.  See an eye doctor and dentist regularly.  Never drive under the influence of alcohol or drugs (including prescription drugs).   It is critically important to prevent falling down (keep floor areas well-lit, dry, and free of loose objects.  If you have a cane, walker, or wheelchair, you should use it, even for short trips around the house.  Wear flat-soled shoes.  Also, try not to rush).   Please come back for a regular physical appointment in 6 months (must be after 12/27/17).

## 2017-06-26 NOTE — Progress Notes (Signed)
we discussed code status.  pt requests full code, but would not want to be started or maintained on artificial life-support measures if there was not a reasonable chance of recovery 

## 2017-06-26 NOTE — Progress Notes (Signed)
Subjective:    Patient ID: Ryan Contreras, male    DOB: 09/06/1941, 76 y.o.   MRN: [id number]  HPI Pt returns for f/u of diabetes mellitus:  DM type: 2 Dx'ed: 0865 Complications: CAD Therapy: none now.  DKA: never.  Severe hypoglycemia: never.  Pancreatitis: never.  Other: he has never been on insulin.  Interval history: he has gained a few lbs.  pt states he feels well in general. Past Medical History:  Diagnosis Date  . CAD, NATIVE VESSEL    coronary stent x1-tx. Plavix use.  Marland Kitchen COPD (chronic obstructive pulmonary disease) ([place])    due to 50+ years Smoking.  . Gastrointestinal bleeding, upper 04/29/2016  . Gout, unspecified    foot  . HYPERLIPIDEMIA   . HYPERTENSION   . NSCL ca dx'd 12/2015  . PEPTIC ULCER DISEASE   . TOBACCO ABUSE   . Type 2 diabetes mellitus ([place])    denies on 01-06-16    Past Surgical History:  Procedure Laterality Date  . CARDIAC CATHETERIZATION     '06-stent placed, 6'13 - Dr. Lizbeth Bark follows  . CORONARY STENT PLACEMENT     s/p  . ENDOBRONCHIAL ULTRASOUND Bilateral 01/11/2016   Procedure: ENDOBRONCHIAL ULTRASOUND;  Surgeon: Collene Gobble, MD;  Location: [facility];  Service: Endoscopy;  Laterality: Bilateral;    Social History   Social History  . Marital status: Divorced    Spouse name: N/A  . Number of children: 2  . Years of education: N/A   Occupational History  . 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 Smoking status: Current Every Day Smoker    Packs/day: 0.50    Years: 50.00    Types: Cigarettes  . Smokeless tobacco: Never Used     Comment: currently smoking 1cig/day (05/23/16)  . Alcohol use 0.0 oz/week     Comment: 1 beer/day  . Drug use: No  . Sexual activity: Not on file   Other Topics Concern  . Not on file   Social History Narrative   Widowed 2003    Current Outpatient Prescriptions on File Prior to Visit  Medication Sig Dispense Refill  . acetaminophen (TYLENOL) 500 MG tablet Take 250 mg by  mouth every 6 (six) hours as needed for moderate pain. Reported on 04/29/2016    . acetaZOLAMIDE (DIAMOX) 250 MG tablet Take 1 tablet (250 mg total) by mouth daily. 30 tablet 11  . allopurinol (ZYLOPRIM) 100 MG tablet TAKE 1 TABLET BY MOUTH EVERY DAY 90 tablet 3  . aspirin 81 MG tablet Take 81 mg by mouth at bedtime. Reported on 05/02/2016    . atorvastatin (LIPITOR) 80 MG tablet TAKE 1 TABLET BY MOUTH EVERY DAY 30 tablet 0  . budesonide-formoterol (SYMBICORT) 160-4.5 MCG/ACT inhaler Inhale 2 puffs into the lungs 2 (two) times daily. 1 Inhaler 6  . clopidogrel (PLAVIX) 75 MG tablet TAKE 1 TABLET BY MOUTH EVERY DAY 90 tablet 2  . ferrous sulfate 325 (65 FE) MG tablet Take 325 mg by mouth daily with breakfast.    . losartan-hydrochlorothiazide (HYZAAR) 100-12.5 MG tablet TAKE 1 TABLET BY MOUTH DAILY. 90 tablet 2  . pantoprazole (PROTONIX) 40 MG tablet TAKE 1 TABLET BY MOUTH EVERY DAY 90 tablet 3  . ranitidine (ZANTAC) 150 MG tablet Take 150 mg by mouth daily.  1  . senna-docusate (SENOKOT-S) 8.6-50 MG tablet Take 1 tablet by mouth at bedtime.     Marland Kitchen umeclidinium bromide (INCRUSE ELLIPTA) 62.5 MCG/INH AEPB  Inhale 1 puff into the lungs daily. 2 each 0   No current facility-administered medications on file prior to visit.     No Known Allergies  Family History  Problem Relation Age of Onset  . Cancer Sister        Uncertain type  . Cancer Brother        Uncertain type  . Arthritis Mother   . Hypertension Father   . Heart attack Father     BP 122/62   Pulse (!) 105   Ht 6\' 1"  (1.854 m)   Wt 197 lb (89.4 kg)   SpO2 95%   BMI 25.99 kg/m    Review of Systems Denies leg cramps    Objective:   Physical Exam VITAL SIGNS:  See vs page GENERAL: no distress Pulses: foot pulses are intact bilaterally.   MSK: no deformity of the feet or ankles.  CV: 1+ bilat edema of the legs and ankles Skin:  no ulcer on the feet or ankles.  normal color and temp on the feet and ankles Neuro: sensation  is intact to touch on the feet and ankles.  Ext: There is bilateral onychomycosis of the toenails.     A1c=6.3%  Lab Results  Component Value Date   CREATININE 0.8 06/16/2017   BUN 10.3 06/16/2017   NA 141 06/16/2017   K 3.4 (L) 06/16/2017   CL 99 05/24/2017   CO2 35 (H) 06/16/2017      Assessment & Plan:  Type 2 DM: no medication is needed.  We'll follow this Hypokalemia: he needs increased rx.  I have sent a prescription to your pharmacy, to increase KLOR  Subjective:   Patient here for Medicare annual wellness visit and management of other chronic and acute problems.     Risk factors: advanced age    65 of Physicians Providing Medical Care to Patient:  See "snapshot"   Activities of Daily Living: In your present state of health, do you have any difficulty performing the following activities (lives with dtr)?:  Preparing food and eating?: No  Bathing yourself: No  Getting dressed: No  Using the toilet:No  Moving around from place to place: No  In the past year have you fallen or had a near fall?:No    Home Safety: Has smoke detector and wears seat belts. Firearms are safely stored.   Diet and Exercise  Current exercise habits: good, as limited by doe Dietary issues discussed: pt reports a healthy diet   Depression Screen  Q1: Over the past two weeks, have you felt down, depressed or hopeless?no  Q2: Over the past two weeks, have you felt little interest or pleasure in doing things? no   The following portions of the patient's history were reviewed and updated as appropriate: allergies, current medications, past family history, past medical history, past social history, past surgical history and problem list.   Review of Systems  Denies hearing loss, and visual loss.   Objective:   Vision:  [employer], but does not recall name.   Hearing: grossly normal Body mass index:  See vs page Msk: pt easily and quickly performs "get-up-and-go" from a sitting  position.   Cognitive Impairment Assessment: cognition, memory and judgment appear normal.  remembers 1/3 at 5 minutes (? effort).  excellent recall.  can easily read and write a sentence.  alert and oriented x 3.    Assessment:   Medicare wellness utd on preventive parameters    Plan:  During the course of the visit the patient was educated and counseled about appropriate screening and preventive services including:        Fall prevention is advised.    Diabetes care is rendered.  Nutrition counseling is offered.   Vaccines are updated as needed.   Patient Instructions (the written plan) was given to the patient.

## 2017-07-06 ENCOUNTER — Telehealth: Payer: Self-pay | Admitting: Endocrinology

## 2017-07-06 NOTE — Telephone Encounter (Signed)
Patient calling in reference to questions about Rx  donepezil (ARICEPT) 5 MG tablet  Please call patient and advise. OK to leave message.

## 2017-07-06 NOTE — Telephone Encounter (Signed)
Routing to you °

## 2017-07-06 NOTE — Telephone Encounter (Signed)
Called patient to clarify he was taking aricept to help improve cognitive skills and memory. He seemed to understand and was clear.

## 2017-07-19 ENCOUNTER — Ambulatory Visit (INDEPENDENT_AMBULATORY_CARE_PROVIDER_SITE_OTHER): Payer: Medicare Other | Admitting: Pulmonary Disease

## 2017-07-19 VITALS — BP 100/60 | HR 103 | Temp 98.2°F | Ht 73.0 in | Wt 196.4 lb

## 2017-07-19 DIAGNOSIS — F17210 Nicotine dependence, cigarettes, uncomplicated: Secondary | ICD-10-CM | POA: Diagnosis not present

## 2017-07-19 DIAGNOSIS — J9612 Chronic respiratory failure with hypercapnia: Secondary | ICD-10-CM | POA: Diagnosis not present

## 2017-07-19 DIAGNOSIS — C3491 Malignant neoplasm of unspecified part of right bronchus or lung: Secondary | ICD-10-CM | POA: Diagnosis not present

## 2017-07-19 DIAGNOSIS — I2510 Atherosclerotic heart disease of native coronary artery without angina pectoris: Secondary | ICD-10-CM

## 2017-07-19 DIAGNOSIS — J449 Chronic obstructive pulmonary disease, unspecified: Secondary | ICD-10-CM | POA: Diagnosis not present

## 2017-07-19 DIAGNOSIS — I10 Essential (primary) hypertension: Secondary | ICD-10-CM | POA: Diagnosis not present

## 2017-07-19 MED ORDER — BUDESONIDE-FORMOTEROL FUMARATE 160-4.5 MCG/ACT IN AERO: 2.0000 | INHALATION_SPRAY | Freq: Two times a day (BID) | RESPIRATORY_TRACT | 0 refills | Status: DC

## 2017-07-19 NOTE — Patient Instructions (Addendum)
Today we updated your med list in our EPIC system...     We discussed the Diamox (acetazolamide) medication & removed it from your list since it made you kind of drowsy...  Be sure to cut out the last of those cigarettes because they are slowly killing you & not doing you any good!  For the congestion> get some OTC GUAIFENESIN 400mg  tabs at [facility] or [facility]...    Take 2 tabs 3 times daily w/ extra water...  Call for any questions...  Let's plan to keep your prev scheduled f/u appt 11/23/17 at 9:30AM..Marland Kitchen

## 2017-07-20 ENCOUNTER — Other Ambulatory Visit: Payer: Self-pay | Admitting: Internal Medicine

## 2017-07-20 ENCOUNTER — Encounter: Payer: Self-pay | Admitting: Pulmonary Disease

## 2017-07-20 NOTE — Progress Notes (Signed)
Subjective:     Patient ID: Ryan Contreras, male   DOB: 11-11-1941, 76 y.o.   MRN: [id number]  HPI 76 y/o BM followed for severe COPD, Hx hypercapnic resp failure, still smoking cigarettes despite Dx non-small cell lung cancer 12/2015 treated by Ryan Contreras w/ concurrent chemoradiation...  ~  04/27/15:  433moROV w/ KC>        Patient comes in today for follow-up of his known COPD. He is staying on his bronchodilator regimen, but unfortunately continues to smoke. He feels that his exertional tolerance is at baseline, and has not had an acute exacerbation since the last visit. He does continue to have cough and nonpurulent mucus, but I have explained this is directly related to his smoking.      REC>  The patient appears to be at his usual baseline from the last visit, and denies any recent acute exacerbation. He is staying on Symbicort, as well as as needed albuterol. Unfortunately, he continues to smoke, and I have stressed to him the importance of total smoking cessation. I have asked him to stay as active as possible, and to follow-up again in 6 months. - Stay on symbicort everyday - Work on some type of exercise - Try to quit smoking.This is the most important treatment for you - Followup with Ryan. NLenna Contreras 630mo  ~  November 02, 2015:  33m79moV w/ SN>        Ryan Contreras [age]o, still smokes ~1/2ppd, and still works as a cusSports coach [employer];  He remains on Symbicort160-2spBid and uses ProairHFA prn;  He notes mild cough, small amt of clear mucus w/o discoloration or blood; he notes SOB/DOE if her rushes or takes the stairs, level ground and ADLs are OK (no change for yrs);  He denies any recent infections...   He started smoking in his teens, he has been smoking for 55 yrs up to 1ppd, and decr to 1/2ppd recently...      EXAM shows Afeb, VSS, O2sat=93% on RA at rest;  HEENT- neg, Mallampati4;  Chest- decr BS bilat & few scat rhonchi, no w/r;  Heart- RR w/o m/r/g heard;  Abd- soft, neg;   Ext- VI, no c/c/e;  Neuro- no focal deficits...   PFT 05/20/11 showed FVC=3.52 (72%), FEV1=1.52 (46%), %1sec=43, mid-flows reduced at 19% predicted; post bronchodil there was a 7% improvement in FEV1;  TLC=6.64 (91%), RV=3.04 (113%), RV/TLC=46;  DLCO=80% predicted.  CXR 04/13/15 showed norm heart size, COPD, mild right base Atx, NAD...   LABS 9-09/2015:  Chems- ok x HCO3=42 indicative of CO2 retention & renal compensation (he may benefit from Diamox);  CBC- wnl;  TSH=0.74;  BNP=216...  NOTE> he is on Hyzaar 100-25 AND Lasix20... IMP/PLAN>>  Ryan Contreras desperately needs to quit smoking- discussed Nicotine replacement options and offered Chantix;  Breathing meds refilled;  I rec that he STOP the Lasix for now & substitute ACETAZOLAMIDE '250mg'$  one tab daily in the afternoon;  He has already had the 2016 Flu vaccine;  Plan ROV in 33mo433moner if needed prn...  ADDENDUM>>  Ryan Contreras had a Low-dose Screening Chest CT 11/26/15- norm heart size, atherosclerosis of Ao & all 3 coronaries, stent in RCA, right hilar fullness c/w adenopathy, 2cm medial RUL nodule, ?medial aspect of left superior segment?, mild centrilobular & paraseptal emphysema... ADDENDUM>>  PET scan was performed 12/11/15- 2.7 x 2.0 spiculated nodule in medial RUL is hypermetabolic w/ hypermet lymphadenopathy in the right hilum, no other hypermet lymphadenopathy; 11mm44m  subsolid nodule in medial LLL shows very low grade metabolic activ (OYDXAJ=2.8); no hypermet activ in Abd x enlarged prostate w/ TURP defect... ADDENDUM>>  He had Bronch w/ EBUS 01/11/16 by Ryan Contreras w/ path c/w a non-small cell lung cancer (favored to be a squamous cell lesion); subsequent MRI Brain 02/04/16 was neg for signs of metastatic dis & MTOC eval & lesion felt to be unresectable Stage IIa non-small cell Ca; seen by Ryan Contreras & Ryan Contreras w/ combined weekly ChemoRadiation w/ carboplatin & paclitaxel x 7cycles last 03/14/16, and XRT apparently delivered in [place] by Ryan Contreras- but there are no  notes in EPIC regarding his XRT treatment...  ADDENDUM>>  Follow up medical oncology appt w/ Ryan Contreras 04/29/16 w/ CT Chest done 5/17- irreg mass in medial RUL is smaller than prev & assoc peripheral reticular changes likely from his XRT, LLL central 425m ill-defined nodule is unchanged & other comparators likewise unchanged... Ryan Contreras offered continued observation vs consolidative chemotherapy but his performance status was poor so they decided on f/u CT in 3 months...  ~  May 23, 2016:  786moOV w/ SN>  JoRhianas been diagnosed & treated for a non-small cell lung cancer in his right hilum- see above;  He also developed abd pain/ GIB & [facility] w/ Hg=4.3, required 5u transfusion & eval by GI- acute & chronic gastritis/ duodenitis & ?mass in duodenum but Bx was neg x inflamm (Bx also neg for HPylori but his HPylori Ab test was pos);  He was treated w/ triple therapy & improved, they made the decision to restart his ASA/Plavix at disch due to his CAD & stent; Hg improved to the 9-10 range and this is to be followed up by his PCP- Ryan BurlingtonI reviewed records in Care Everywhere)...     Ryan Contreras continues on his Dulera200- 2spBid but unfortunately is also still smoking despite all he's been through- he declines smoking cessation help, encouraged to try nicotine replacement rx or consider Chantix;  We reviewed the following medical problems during today's office visit >>     Severe airflow obstruction w/ GOLD Stage3 COPD>  He is on Dulera200-2spBid & ProairHFA prn; must quit smoking, stay active...     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    Lung Cancer>  diagnosed & treated 12/2015 for a non-small cell lung cancer in his right hilum; given combined ChemoXRT by Ryan Contreras & Wentworth=>DrPalmero in [place]; f/u CT Chest by  Ryan Contreras 04/2016=> he plans f/u CT 25m57mo   On-going tobacco abuse>  He continues to smoke ?several cigs/d; we discussed smoking cessation options...     Cardiac>  HBP, CAD (DrRoss) w/ RCA stent, extensive atherosclerosis in Ao etc; on ASA81, Plavix75 (restarted after 03/2016 GIB), Hyzaar100-12.5, K10.    GI>  [facility] 4/12 - 04/01/16 w/ Hg=4.3, required 5u transfusion & eval by GI- acute & chronic gastritis/ duodenitis & ?mass in duodenum but Bx was neg x inflamm (Bx also neg for HPylori but his HPylori Ab test was pos);  He was treated w/ triple therapy & improved, they made the decision to restart his ASA/Plavix at disch due to his CAD & stent; Hg improved to the 9-10 range and this is to be followed up by his PCP- DrEllison & [facility] (I reviewed records in Care  Everywhere)...     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      EXAM shows Afeb, VSS, O2sat=97% on RA at rest;  HEENT- neg, Mallampati4;  Chest- decr BS bilat & few scat rhonchi, no w/r;  Heart- RR gr1/6 SEM, w/o /r/g heard;  Abd- soft, nontender;  Ext- VI, no c/c/e;  Neuro- no focal deficits...   Imaging studies and LABS from 03/2016- [facility] reviewed in [place]... IMP/PLAN>>  Problems as listed;  Ryan Contreras needs to quit all smoking & grad increase his exercise program;  Continue Dulera200-2spBid & add MUCINEX 649m tabs- 2Bid w/ fluids;  He will maintain f/u w/ his PCP, Oncologist, & GI;  We plan recheck in 663mosooner if needed for any reason...   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 we reviewed the following medical problems during today's office visit >>     Severe airflow obstruction w/ GOLD Stage3 COPD>  He is on Dulera200-2spBid & ProairHFA prn; must quit smoking, stay active... C/o DOE w/ some ADLs, no change, not exercising, mild cough/ sput (clear, no bl)/ no CP/ etc.    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    Lung Cancer>  diagnosed & treated 12/2015 for a non-small cell lung cancer in right hilum; given combined ChemoXRT by Ryan Contreras & Wentworth=>DrPalmero in [place]; f/u CT Chest scans by Ryan Contreras- notes reviewed.    On-going tobacco abuse>  He continues to smoke ?several cigs/d; we discussed smoking cessation options...     Cardiac>  HBP, CAD (DrRoss) w/ RCA stent, extensive atherosclerosis in Ao etc; on ASA81, Plavix75 (restarted after 03/2016 GIB), Hyzaar100-12.5, K10.    GI>  [facility] 4/12 - 04/01/16 w/ Hg=4.3, required 5u transfusion & eval by GI- acute & chronic gastritis/ duodenitis & ?mass in duodenum but Bx was neg x inflamm (Bx also neg for HPylori but his HPylori Ab test was pos);  He was treated w/ triple therapy & improved, they made the decision to restart his ASA/Plavix at disch due to his CAD & stent; Hg improved to the 9-10 range and this is to be followed up by his PCP- DrElFrenchtownreviewed records in Care Everywhere)...     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  CXR 11/22/16>  Norm heart size, calcif in Ao, sl interstitial prom/ scarring in right mid-lung vicinity of prev XRT, COPD, vol loss right base...  CT Chest > pending per Ryan Contreras IMP/PLAN>>  Ryan Contreras has severe chronic problems- followed regularly by Ryan Contreras, DrEllison Harriett Sine;  He would be better from the pulm standpoint if he'd quit all smoking & take his prescribed meds regularly (med non-compliance confirmed w/ call to his Pharm today); we added a LAMA to his regimen- SPIRIVA once daily; advised to take meds regularly, incr exercise program; we plan rov recheck in 25mo..  ~  May 24, 2017:  662moOV & JoNazimeturns unaccompanied- states his breathing is pretty good, still smoking 1-2 cig/d, notes mild cough w/ small amt beige sput, no hemoptysis, chronic stable SOB/ DOE w/o change, denies CP;  He has [facility] O2 [facility] concentrator & small O2 tanks-- he uses the Oxygen Qhs ("it helps me sleep") and prn days;  He requests samples of his inhalers raising the spector of medication non-compliance: [facility] [place] he only filled the Symbicort in March & June (one mo supply), and has never filled the Spiriva!      He saw Ryan Contreras 12/19/16> note reviewed, hx squamous cell ca RUL w/ hilar adenopathy Dx 12/2015, treated w/ chemoradiation (7 cycles last dose 03/14/16), currently on observation; f/u CXR 11/22/16 showed stable interstitial prom in right mid lung, COPD, chr vol loss right lung base, no acute changes;  f/u CT Chest 11/30/16 showed further decr size of RUL pulm nodule, now 19x8m72mprev 21x11m67m& no new lesions...    He was seen 12/27/16 by DrEllison> note reviewed, wellness visit w/ the following recommendations-- minimize alcohol.  Do not use tobacco products.  Have a colonoscopy at least every 10 years from age 9. 58eep firearms safely stored.  Always use seat belts.  have working smoke alarms in your home.  See an eye doctor and dentist regularly.  Never drive under the influence of alcohol or drugs (including prescription drugs).  It is critically important to prevent falling down (keep floor areas well-lit, dry, and free of loose objects.  If you have a cane, walker, or wheelchair, you should use it, even for short trips around the house.  Wear flat-soled shoes.  Also, try not to rush).      Severe airflow obstruction w/ GOLD Stage3 COPD>  He is supposed to be on Symbicort160-2spBid & Spiriva daily but not filling the Rx regularly; must quit smoking & stay active... c/o DOE w/ some ADLs, no change, not exercising, mild cough/ sput (clear, no bl)/ no CP/ etc.    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    Lung Cancer>  diagnosed & treated 12/2015 for a non-small cell lung cancer in right hilum; given combined ChemoXRT by Ryan Contreras & Wentworth=>DrPalmero in [place]; f/u CT Chest scans by Ryan Contreras- notes reviewed.    On-going tobacco abuse>  He continues to smoke ?several cigs/d; we discussed smoking cessation options...     Cardiac>  HBP, CAD (  DrRoss) w/ RCA stent, extensive atherosclerosis in Ao etc; on ASA81, Plavix75 (restarted after 03/2016 GIB), Hyzaar100-12.5, K10.    GI>  [facility] 4/12 - 04/01/16 w/ Hg=4.3, required 5u transfusion & eval by GI- acute & chronic gastritis/ duodenitis & ?mass in duodenum but Bx was neg x inflamm (Bx also neg for HPylori but his HPylori Ab test was pos);  He was treated w/ triple therapy & improved,  they made the decision to restart his ASA/Plavix at disch due to his CAD & stent; Hg improved to the 9-10 range and this is to be followed up by his PCP- [place] (I reviewed records in Care Everywhere)...     Medical>  PCP is DrEllison> HL on Lip80, BPH w/ BOO, Elev PSA ([place]), Gout on Allopurinol300, & ADM to [facility] 03/2016 w/ GIB- gastitis, duodenitis, ?duodenal mass, +HPylori Ab test (neg Bx) and treated w/ triple therapy & improved (now taking Protonix40, Zantac150, Senakot-S); Anemia- on FeSO4...      EXAM shows Afeb, VSS, O2sat=95% on RA at rest;  HEENT- neg, Mallampati4;  Chest- decr BS bilat & few scat rhonchi, no w/r;  Heart- RR gr1/6 SEM, w/o /r/g heard;  Abd- soft, nontender;  Ext- VI, no c/c/e;  Neuro- non-focal.  Ambulatory oximetry 05/24/17>  O2sat=92% w/ pulse=86/min;  He ambulated 2 Laps in office (185'ea) & stopped w/ lowest O2sat=87% w/ pulse=105/min...  LABS 05/24/17>  Chems- HCO3=38 (pt off Diamox now), K=3.9, BS=112, Cr=0.81;  CBC- wnl;  Sed=40 IMP/PLAN>>  Ryan Contreras is his own worse enemy-- must quit all smoking, must take meds and inhalers regularly, and get on a good exercise program;  He needs his port O2 when ambulating;  Re-start the Symbicort/ Spiriva/ Diamox'250mg'$ /d...   ~  July 19, 2017:  47moROV & Ryan Contreras states that he is INTOL to the Diamox250, made him "drowsy" after 2 pills he says;  He has O2 to use nightly but "not using it EVERY night" & none during the days;  Still smoking 1-2 cig/d even though "they make me drowsy too sometimes";  He has Symbicort160- 2spBid & Incruse once daily, + GFN400-2Tid w/ fluids... He notes that his SOB/DOE is stable, notes min cough w/ some clear sput, no hemoptysis    He saw ONC-Ryan Contreras 06/19/17>  DIAGNOSIS: Unresectable Stage IIA (T1b, N1, M0) non-small cell lung cancer, squamous cell carcinoma presented with right upper lobe lung nodule and hilar lymphadenopathy diagnosed in January 2017.  PRIOR THERAPY: Course of concurrent chemoradiation  with weekly carboplatin for AUC of 2 and paclitaxel 45 MG/M2. Status post 7 cycles, last dose was given 03/14/2016 with partial response.  CURRENT THERAPY: Observation.  Follow up CT Chest (see below) showed no evid of dis progression- they plan recheck in 650mo.     His PCP is listed as DrEllison- seen 06/26/17>  DM2 on diet alone (A1c=6.3), HBP, CAD, HL, etc;  Annual medicare wellness visit, added Aricept'5mg'$ , incr K20/d...    EXAM shows Afeb, VSS, O2sat=95% on RA at rest;  HEENT- neg, Mallampati4;  Chest- decr BS bilat & few scat rhonchi, no w/r;  Heart- RR gr1/6 SEM, w/o /r/g heard;  Abd- soft, nontender;  Ext- VI, no c/c/e;  Neuro- non-focal.  CT Chest 06/19/17>  IMPRESSION:  1) Decrease in nodular thickening about the RIGHT hilum. No new nodularity the RIGHT lung.  2) Stable nodular cystic changes in the superior segment LEFT lower lobe. Lesion relatively stable compared to PET-CT of 12/11/2015.  3) Benign lesion posterior aspect of  the RIGHT hepatic lobe. Liver has a nodular contour.  LABS in Epic July2018>  Chems- HCO3=35 off diamox, BS=128 & A1c=6.3 on diet alone;  LFTs wnl;  CBC- wnl IMP/PLAN>>  Problems as noted above- he remains is own worst enemy w/ poor medication compliance & continue smoking; we reviewed & discussed both issues again;  Reminded to use the GFN400-2Tid regularly as well...    Past Medical History  Diagnosis Date   COPD w/ severe airflow obstruction & GOLD Stage 3 COPD> now on Symbicort160-2spBid + Incruse once daily when he fills rx    Lung Cancer> right hilar mass found on low dose screening CT 11/2015, Bx= non small cell Ca (SCCa favored) & treated w/ chemoradiation completed 03/2016...   . CAD, NATIVE VESSEL >> followed by DrRoss, CATH 05/2012= 60%LAD, 40%D1, 40%Circ, 40%RCA, EF=55% w/ min inferobasal HK... He has Hx PTA/stent in RCA   . Gout, unspecified   . HYPERLIPIDEMIA   . HYPERTENSION   . PEPTIC ULCER DISEASE           ADM to [facility] 03/2016 w/ GIB, Hg=4.3, required  5u Tx, eval w/ gastitis & duodenitis w/ ?duodenal mass- neg Bx (+HPylori Ab test); treated w/ triple therapy & improved...   . TOBACCO ABUSE => counseled to quit all smoking   . Type 2 diabetes mellitus (HCC)     ANEMIA> acute GI blood loss anemia 03/2016 => on Fe     Past Surgical History:  Procedure Laterality Date  . CARDIAC CATHETERIZATION     '06-stent placed, 6'13 - Ryan. Lizbeth Bark follows  . CORONARY STENT PLACEMENT     s/p  . ENDOBRONCHIAL ULTRASOUND Bilateral 01/11/2016   Procedure: ENDOBRONCHIAL ULTRASOUND;  Surgeon: Collene Gobble, MD;  Location: [facility];  Service: Endoscopy;  Laterality: Bilateral;    Outpatient Encounter Prescriptions as of 07/19/2017  Medication Sig  . acetaminophen (TYLENOL) 500 MG tablet Take 250 mg by mouth every 6 (six) hours as needed for moderate pain. Reported on 04/29/2016  . allopurinol (ZYLOPRIM) 100 MG tablet TAKE 1 TABLET BY MOUTH EVERY DAY  . aspirin 81 MG tablet Take 81 mg by mouth at bedtime. Reported on 05/02/2016  . atorvastatin (LIPITOR) 80 MG tablet TAKE 1 TABLET BY MOUTH DAILY.  . budesonide-formoterol (SYMBICORT) 160-4.5 MCG/ACT inhaler Inhale 2 puffs into the lungs 2 (two) times daily.  . clopidogrel (PLAVIX) 75 MG tablet TAKE 1 TABLET BY MOUTH EVERY DAY  . losartan-hydrochlorothiazide (HYZAAR) 100-12.5 MG tablet Take 1 tablet by mouth daily.  . pantoprazole (PROTONIX) 40 MG tablet TAKE 1 TABLET BY MOUTH EVERY DAY  . senna-docusate (SENOKOT-S) 8.6-50 MG tablet Take 1 tablet by mouth at bedtime.   Marland Kitchen acetaZOLAMIDE (DIAMOX) 250 MG tablet Take 1 tablet (250 mg total) by mouth daily. (Patient not taking: Reported on 05/24/2017)  . ferrous sulfate 325 (65 FE) MG tablet Take 325 mg by mouth daily with breakfast.  . KLOR-CON M10 10 MEQ tablet Take 10 mEq by mouth daily.   Marland Kitchen KLOR-CON M10 10 MEQ tablet TAKE 1 TABLET (10 MEQ TOTAL) BY MOUTH DAILY. (Patient not taking: Reported on 05/24/2017)  . ranitidine (ZANTAC) 150 MG tablet Take 150 mg by mouth  daily.  . sildenafil (VIAGRA) 100 MG tablet Take 1 tablet (100 mg total) by mouth as needed for erectile dysfunction. (Patient not taking: Reported on 05/24/2017)  . [DISCONTINUED] allopurinol (ZYLOPRIM) 100 MG tablet Take 100 mg by mouth daily.  Marland Kitchen  tiotropium (SPIRIVA) 18 MCG inhalation capsule Place  1 capsule (18 mcg total) into inhaler and inhale daily. (Patient not taking: Reported on 05/24/2017)   No facility-administered encounter medications on file as of 05/24/2017.     No Known Allergies   Immunization History  Administered Date(s) Administered  . Influenza Whole 08/11/2010, 10/10/2012  . Influenza,inj,Quad PF,36+ Mos 09/11/2013  . Influenza-Unspecified 09/11/2014, 09/14/2015, 09/30/2016  . Pneumococcal Conjugate-13 06/03/2009  . Pneumococcal Polysaccharide-23 08/11/2010  . Td 12/13/2002    Current Medications, Allergies, Past Medical History, Past Surgical History, Family History, and Social History were reviewed in [employer] record.   Review of Systems  Constitutional: Negative for fever and unexpected weight change.  HENT: Negative for congestion, dental problem, ear pain, nosebleeds, postnasal drip, rhinorrhea, sinus pressure, sneezing, sore throat and trouble swallowing.  Eyes: Negative for redness and itching.  Respiratory: Positive for cough and no wheezing. Negative for chest tightness and shortness of breath.  Cardiovascular: Negative for palpitations and leg swelling.  Gastrointestinal: Negative for nausea and vomiting.  Genitourinary: Negative for dysuria.  Musculoskeletal: Negative for joint swelling.  Skin: Negative for rash.  Neurological: Negative for headaches.  Hematological: Does not bruise/bleed easily.  Psychiatric/Behavioral: Negative for dysphoric mood. The patient is not nervous/anxious.    Objective:   Physical Exam  76 y/o gentleman, chr ill appearing, in NAD...  Nose without purulence or discharge noted Neck  without lymphadenopathy or thyromegaly Chest with decreased breath sounds, no active wheezing Cardiac exam with regular rate and rhythm Lower extremities without edema, no cyanosis Alert and oriented, moves all 4 extremities.   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    LUNG CANCER>  Non-small cell ca Dx 12/2015 & treated by Ryan Contreras & XRT    On-going tobacco abuse>  He continues to smoke ?several cigs/d; we discussed smoking cessation options...     Cardiac>  HBP, CAD (DrRoss) w/ RCA stent, extensive atherosclerosis in Ao etc; on ASA81, Plavix75 (restarted after 03/2016 GIB), Hyzaar100-12.5, K10.    Medical>  HL on Lip80, BPH w/ BOO, Elev PSA ([place]), Gout on Allopurinol300, & ADM to [facility] 03/2016 w/ GIB- gastitis, duodenitis, ?duodenal mass, +HPylori Ab test (neg Bx) and treated w/ triple therapy & improved (now taking Protonix40, Zantac150, Senakot-S); Anemia- on FeSO4...  PLAN >>  11/02/15>   Ryan Contreras desperately needs to quit smoking- discussed Nicotine replacement options and offered Chantix;  Breathing meds refilled;  I rec that he STOP the Lasix for now & substitute ACETAZOLAMIDE '250mg'$  one tab daily in the afternoon;  He has already had the 2016 Flu vaccine;  Plan ROV in 33mosooner if needed prn. 05/23/16>   Problems as listed;  Yaroslav needs to quit all smoking & grad increase his exercise program;  Continue Dulera200-2spBid & add MUCINEX '600mg'$  tabs- 2Bid w/ fluids;  He will maintain f/u w/ his PCP, Oncologist, & GI;  We plan recheck in 660mosooner if needed for any reason...  11/22/16>   Ryan Contreras has severe chronic problems- followed regularly by Ryan Contreras, DrEllison etHarriett Sine He  would be better from the pulm standpoint if he'd quit all smoking & take his prescribed  meds regularly (med non-compliance confirmed w/ call to his Pharm today); advised to take meds regularly, incr exercise program; we plan rov recheck in 12mo  05/24/17>    Ryan Contreras is his own worse enemy-- must quit all smoking, must take meds and inhalers regularly, and get on a good exercise program;  He needs his port O2 when ambulating;  Re-start the Symbicort/ Spiriva/ Diamox'250mg'$ /d... 07/19/17>   Problems as noted above- he remains is own worst enemy w/ poor medication compliance & continue smoking; we reviewed & discussed both issues again;  Reminded to use the [id number] regularly as well...   Plan:     Patient's Medications  New Prescriptions   BUDESONIDE-FORMOTEROL (SYMBICORT) 160-4.5 MCG/ACT INHALER    Inhale 2 puffs into the lungs 2 (two) times daily.  Previous Medications   ACETAMINOPHEN (TYLENOL) 500 MG TABLET    Take 250 mg by mouth every 6 (six) hours as needed for moderate pain. Reported on 04/29/2016   ALLOPURINOL (ZYLOPRIM) 100 MG TABLET    TAKE 1 TABLET BY MOUTH EVERY DAY   ASPIRIN 81 MG TABLET    Take 81 mg by mouth at bedtime. Reported on 05/02/2016   ATORVASTATIN (LIPITOR) 80 MG TABLET    TAKE 1 TABLET BY MOUTH EVERY DAY   BUDESONIDE-FORMOTEROL (SYMBICORT) 160-4.5 MCG/ACT INHALER    Inhale 2 puffs into the lungs 2 (two) times daily.     CLOPIDOGREL (PLAVIX) 75 MG TABLET    TAKE 1 TABLET BY MOUTH EVERY DAY   DONEPEZIL (ARICEPT) 5 MG TABLET    Take 1 tablet (5 mg total) by mouth at bedtime.   FERROUS SULFATE 325 (65 FE) MG TABLET    Take 325 mg by mouth daily with breakfast.   LOSARTAN-HYDROCHLOROTHIAZIDE (HYZAAR) 100-12.5 MG TABLET    TAKE 1 TABLET BY MOUTH DAILY.   PANTOPRAZOLE (PROTONIX) 40 MG TABLET    TAKE 1 TABLET BY MOUTH EVERY DAY   POTASSIUM CHLORIDE SA (KLOR-CON M20) 20 MEQ TABLET    Take 1 tablet (20 mEq total) by mouth daily.   RANITIDINE (ZANTAC) 150 MG TABLET    Take 150 mg by mouth  daily.   SENNA-DOCUSATE (SENOKOT-S) 8.6-50 MG TABLET    Take 1 tablet by mouth at bedtime.    UMECLIDINIUM BROMIDE (INCRUSE ELLIPTA) 62.5 MCG/INH AEPB    Inhale once daily as directed.  Modified Medications   No medications on file  Discontinued Medications   ACETAZOLAMIDE (DIAMOX) 250 MG TABLET    Take 1 tablet (250 mg total) by mouth daily.

## 2017-08-29 ENCOUNTER — Other Ambulatory Visit: Payer: Self-pay | Admitting: Internal Medicine

## 2017-10-08 ENCOUNTER — Other Ambulatory Visit: Payer: Self-pay | Admitting: Internal Medicine

## 2017-10-09 ENCOUNTER — Telehealth: Payer: Self-pay | Admitting: Internal Medicine

## 2017-10-09 ENCOUNTER — Other Ambulatory Visit: Payer: Self-pay | Admitting: Internal Medicine

## 2017-10-09 NOTE — Telephone Encounter (Signed)
°*  STAT* If patient is at the pharmacy, call can be transferred to refill team.   1. Which medications need to be refilled? (please list name of each medication and dose if known)Plavix 75mg    2. Which pharmacy/location (including street and city if local pharmacy) is medication to be sent to?[facility] [place]  3. Do they need a 30 day or 90 day supply? [place]

## 2017-10-09 NOTE — Telephone Encounter (Signed)
New Message   *STAT* If patient is at the pharmacy, call can be transferred to refill team.   1. Which medications need to be refilled? (please list name of each medication and dose if known) per pt does nto know the name of the medication prescribed from Dr. Harrington Challenger   2. Which pharmacy/location (including street and city if local pharmacy) is medication to be sent to? [facility]   3. Do they need a 30 day or 90 day supply? [place]

## 2017-11-23 ENCOUNTER — Encounter: Payer: Self-pay | Admitting: Pulmonary Disease

## 2017-11-23 ENCOUNTER — Ambulatory Visit (INDEPENDENT_AMBULATORY_CARE_PROVIDER_SITE_OTHER): Payer: Medicare Other | Admitting: Pulmonary Disease

## 2017-11-23 ENCOUNTER — Other Ambulatory Visit (INDEPENDENT_AMBULATORY_CARE_PROVIDER_SITE_OTHER): Payer: Medicare Other

## 2017-11-23 ENCOUNTER — Ambulatory Visit (INDEPENDENT_AMBULATORY_CARE_PROVIDER_SITE_OTHER)
Admission: RE | Admit: 2017-11-23 | Discharge: 2017-11-23 | Disposition: A | Discharge status: Home / Self Care | Payer: Medicare Other | Source: Ambulatory Visit | Attending: Pulmonary Disease | Admitting: Pulmonary Disease

## 2017-11-23 VITALS — BP 118/74 | HR 111 | Temp 98.0°F | Ht 73.0 in | Wt 199.0 lb

## 2017-11-23 DIAGNOSIS — J449 Chronic obstructive pulmonary disease, unspecified: Secondary | ICD-10-CM | POA: Diagnosis not present

## 2017-11-23 DIAGNOSIS — C3491 Malignant neoplasm of unspecified part of right bronchus or lung: Secondary | ICD-10-CM

## 2017-11-23 DIAGNOSIS — I10 Essential (primary) hypertension: Secondary | ICD-10-CM

## 2017-11-23 DIAGNOSIS — J9612 Chronic respiratory failure with hypercapnia: Secondary | ICD-10-CM

## 2017-11-23 DIAGNOSIS — F17210 Nicotine dependence, cigarettes, uncomplicated: Secondary | ICD-10-CM

## 2017-11-23 DIAGNOSIS — I2510 Atherosclerotic heart disease of native coronary artery without angina pectoris: Secondary | ICD-10-CM | POA: Diagnosis not present

## 2017-11-23 LAB — CBC WITH DIFFERENTIAL/PLATELET
BASOS ABS: 0.1 10*3/uL (ref 0.0–0.1)
BASOS PCT: 1 % (ref 0.0–3.0)
EOS ABS: 0.2 10*3/uL (ref 0.0–0.7)
Eosinophils Relative: 2.7 % (ref 0.0–5.0)
HCT: 42 % (ref 39.0–52.0)
Hemoglobin: 13.7 g/dL (ref 13.0–17.0)
LYMPHS ABS: 2.1 10*3/uL (ref 0.7–4.0)
Lymphocytes Relative: 25.1 % (ref 12.0–46.0)
MCHC: 32.6 g/dL (ref 30.0–36.0)
MCV: 98.8 fl (ref 78.0–100.0)
MONO ABS: 1.5 10*3/uL — AB (ref 0.1–1.0)
NEUTROS ABS: 4.3 10*3/uL (ref 1.4–7.7)
NEUTROS PCT: 52.2 % (ref 43.0–77.0)
PLATELETS: 290 10*3/uL (ref 150.0–400.0)
RBC: 4.25 Mil/uL (ref 4.22–5.81)
RDW: 15.5 % (ref 11.5–15.5)
WBC: 8.2 10*3/uL (ref 4.0–10.5)

## 2017-11-23 LAB — BASIC METABOLIC PANEL
BUN: 13 mg/dL (ref 6–23)
CHLORIDE: 99 meq/L (ref 96–112)
CO2: 39 mEq/L — ABNORMAL HIGH (ref 19–32)
Calcium: 9.3 mg/dL (ref 8.4–10.5)
Creatinine, Ser: 0.8 mg/dL (ref 0.40–1.50)
GFR: 121.53 mL/min (ref >60.00)
Glucose, Bld: 90 mg/dL (ref 70–99)
POTASSIUM: 4 meq/L (ref 3.5–5.1)
Sodium: 140 mEq/L (ref 135–145)

## 2017-11-23 MED ORDER — BUDESONIDE-FORMOTEROL FUMARATE 160-4.5 MCG/ACT IN AERO: 2.0000 | INHALATION_SPRAY | Freq: Two times a day (BID) | RESPIRATORY_TRACT | 0 refills | Status: DC

## 2017-11-23 NOTE — Patient Instructions (Signed)
Today we updated your med list in our EPIC system...    Continue your current medications the same...  Today we checked a follow up yearly CXR & some blood work...    We will contact you w/ the results when available...   Markon you need to quit the last of thoe nasty cigarettes!!!  Call for any questions...  Let's plan a follow up visit in 3mo, sooner if needed for problems.Marland KitchenMarland Kitchen

## 2017-11-24 ENCOUNTER — Encounter: Payer: Self-pay | Admitting: Pulmonary Disease

## 2017-11-24 ENCOUNTER — Other Ambulatory Visit: Payer: Self-pay | Admitting: Pulmonary Disease

## 2017-11-24 MED ORDER — EPLERENONE 25 MG PO TABS: 25.0000 mg | ORAL_TABLET | Freq: Every day | ORAL | 5 refills | Status: DC

## 2017-11-24 NOTE — Progress Notes (Signed)
Subjective:     Patient ID: Ryan Contreras, male   DOB: 04-21-1941, 76 y.o.   MRN: [id number]  HPI 76 y/o BM followed for severe COPD, Hx hypercapnic resp failure, still smoking cigarettes despite Dx non-small cell lung cancer 12/2015 treated by Ryan Contreras w/ concurrent chemoradiation...  ~  04/27/15:  45moROV w/ KC>        Patient comes in today for follow-up of his known COPD. He is staying on his bronchodilator regimen, but unfortunately continues to smoke. He feels that his exertional tolerance is at baseline, and has not had an acute exacerbation since the last visit. He does continue to have cough and nonpurulent mucus, but I have explained this is directly related to his smoking.      REC>  The patient appears to be at his usual baseline from the last visit, and denies any recent acute exacerbation. He is staying on Symbicort, as well as as needed albuterol. Unfortunately, he continues to smoke, and I have stressed to him the importance of total smoking cessation. I have asked him to stay as active as possible, and to follow-up again in 6 months. - Stay on symbicort everyday - Work on some type of exercise - Try to quit smoking.This is the most important treatment for you - Followup with Dr. NLenna Contreras 614mo  ~  November 02, 2015:  74m53moV w/ SN>        Ryan Contreras [age], still smokes ~1/2ppd, and still works as a cusSports coach [employer];  He remains on Symbicort160-2spBid and uses ProairHFA prn;  He notes mild cough, small amt of clear mucus w/o discoloration or blood; he notes SOB/DOE if her rushes or takes the stairs, level ground and ADLs are OK (no change for yrs);  He denies any recent infections...   He started smoking in his teens, he has been smoking for 55 yrs up to 1ppd, and decr to 1/2ppd recently...      EXAM shows Afeb, VSS, O2sat=93% on RA at rest;  HEENT- neg, Mallampati4;  Chest- decr BS bilat & few scat rhonchi, no w/r;  Heart- RR w/o m/r/g heard;  Abd- soft, neg;   Ext- VI, no c/c/e;  Neuro- no focal deficits...   PFT 05/20/11 showed FVC=3.52 (72%), FEV1=1.52 (46%), %1sec=43, mid-flows reduced at 19% predicted; post bronchodil there was a 7% improvement in FEV1;  TLC=6.64 (91%), RV=3.04 (113%), RV/TLC=46;  DLCO=80% predicted.  CXR 04/13/15 showed norm heart size, COPD, mild right base Atx, NAD...   LABS 9-09/2015:  Chems- ok x HCO3=42 indicative of CO2 retention & renal compensation (he may benefit from Diamox);  CBC- wnl;  TSH=0.74;  BNP=216...  NOTE> he is on Hyzaar 100-25 AND Lasix20... IMP/PLAN>>  Ryan Contreras desperately needs to quit smoking- discussed Nicotine replacement options and offered Chantix;  Breathing meds refilled;  I rec that he STOP the Lasix for now & substitute ACETAZOLAMIDE 250m12me tab daily in the afternoon;  He has already had the 2016 Flu vaccine;  Plan ROV in 74mo 374moer if needed prn...  ADDENDUM>>  Ryan Contreras had a Low-dose Screening Chest CT 11/26/15- norm heart size, atherosclerosis of Ao & all 3 coronaries, stent in RCA, right hilar fullness c/w adenopathy, 2cm medial RUL nodule, ?medial aspect of left superior segment?, mild centrilobular & paraseptal emphysema... ADDENDUM>>  PET scan was performed 12/11/15- 2.7 x 2.0 spiculated nodule in medial RUL is hypermetabolic w/ hypermet lymphadenopathy in the right hilum, no other hypermet lymphadenopathy; 11mm68m  subsolid nodule in medial LLL shows very low grade metabolic activ (OYDXAJ=2.8); no hypermet activ in Abd x enlarged prostate w/ TURP defect... ADDENDUM>>  He had Bronch w/ EBUS 01/11/16 by Ryan Contreras w/ path c/w a non-small cell lung cancer (favored to be a squamous cell lesion); subsequent MRI Brain 02/04/16 was neg for signs of metastatic dis & MTOC eval & lesion felt to be unresectable Stage IIa non-small cell Ca; seen by Ryan Contreras & Ryan Contreras w/ combined weekly ChemoRadiation w/ carboplatin & paclitaxel x 7cycles last 03/14/16, and XRT apparently delivered in [place] by Ryan Contreras- but there are no  notes in EPIC regarding his XRT treatment...  ADDENDUM>>  Follow up medical oncology appt w/ Ryan Contreras 04/29/16 w/ CT Chest done 5/17- irreg mass in medial RUL is smaller than prev & assoc peripheral reticular changes likely from his XRT, LLL central 425m ill-defined nodule is unchanged & other comparators likewise unchanged... Ryan Contreras offered continued observation vs consolidative chemotherapy but his performance status was poor so they decided on f/u CT in 3 months...  ~  May 23, 2016:  786moOV w/ SN>  JoRhianas been diagnosed & treated for a non-small cell lung cancer in his right hilum- see above;  He also developed abd pain/ GIB & [facility] w/ Hg=4.3, required 5u transfusion & eval by GI- acute & chronic gastritis/ duodenitis & ?mass in duodenum but Bx was neg x inflamm (Bx also neg for HPylori but his HPylori Ab test was pos);  He was treated w/ triple therapy & improved, they made the decision to restart his ASA/Plavix at disch due to his CAD & stent; Hg improved to the 9-10 range and this is to be followed up by his PCP- Ryan BurlingtonI reviewed records in Care Everywhere)...     Ryan Contreras continues on his Dulera200- 2spBid but unfortunately is also still smoking despite all he's been through- he declines smoking cessation help, encouraged to try nicotine replacement rx or consider Chantix;  We reviewed the following medical problems during today's office visit >>     Severe airflow obstruction w/ GOLD Stage3 COPD>  He is on Dulera200-2spBid & ProairHFA prn; must quit smoking, stay active...     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    Lung Cancer>  diagnosed & treated 12/2015 for a non-small cell lung cancer in his right hilum; given combined ChemoXRT by Ryan Contreras & Wentworth=>DrPalmero in [place]; f/u CT Chest by  Ryan Contreras 04/2016=> he plans f/u CT 25m57mo   On-going tobacco abuse>  He continues to smoke ?several cigs/d; we discussed smoking cessation options...     Cardiac>  HBP, CAD (DrRoss) w/ RCA stent, extensive atherosclerosis in Ao etc; on ASA81, Plavix75 (restarted after 03/2016 GIB), Hyzaar100-12.5, K10.    GI>  [facility] 4/12 - 04/01/16 w/ Hg=4.3, required 5u transfusion & eval by GI- acute & chronic gastritis/ duodenitis & ?mass in duodenum but Bx was neg x inflamm (Bx also neg for HPylori but his HPylori Ab test was pos);  He was treated w/ triple therapy & improved, they made the decision to restart his ASA/Plavix at disch due to his CAD & stent; Hg improved to the 9-10 range and this is to be followed up by his PCP- DrEllison & [facility] (I reviewed records in Care  Everywhere)...     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      EXAM shows Afeb, VSS, O2sat=97% on RA at rest;  HEENT- neg, Mallampati4;  Chest- decr BS bilat & few scat rhonchi, no w/r;  Heart- RR gr1/6 SEM, w/o /r/g heard;  Abd- soft, nontender;  Ext- VI, no c/c/e;  Neuro- no focal deficits...   Imaging studies and LABS from 03/2016- [facility] reviewed in [place]... IMP/PLAN>>  Problems as listed;  Steed needs to quit all smoking & grad increase his exercise program;  Continue Dulera200-2spBid & add MUCINEX 649m tabs- 2Bid w/ fluids;  He will maintain f/u w/ his PCP, Oncologist, & GI;  We plan recheck in 663mosooner if needed for any reason...   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 we reviewed the following medical problems during today's office visit >>     Severe airflow obstruction w/ GOLD Stage3 COPD>  He is on Dulera200-2spBid & ProairHFA prn; must quit smoking, stay active... C/o DOE w/ some ADLs, no change, not exercising, mild cough/ sput (clear, no bl)/ no CP/ etc.    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    Lung Cancer>  diagnosed & treated 12/2015 for a non-small cell lung cancer in right hilum; given combined ChemoXRT by Ryan Contreras & Wentworth=>DrPalmero in [place]; f/u CT Chest scans by Ryan Contreras- notes reviewed.    On-going tobacco abuse>  He continues to smoke ?several cigs/d; we discussed smoking cessation options...     Cardiac>  HBP, CAD (DrRoss) w/ RCA stent, extensive atherosclerosis in Ao etc; on ASA81, Plavix75 (restarted after 03/2016 GIB), Hyzaar100-12.5, K10.    GI>  [facility] 4/12 - 04/01/16 w/ Hg=4.3, required 5u transfusion & eval by GI- acute & chronic gastritis/ duodenitis & ?mass in duodenum but Bx was neg x inflamm (Bx also neg for HPylori but his HPylori Ab test was pos);  He was treated w/ triple therapy & improved, they made the decision to restart his ASA/Plavix at disch due to his CAD & stent; Hg improved to the 9-10 range and this is to be followed up by his PCP- DrElFrenchtownreviewed records in Care Everywhere)...     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  CXR 11/22/16>  Norm heart size, calcif in Ao, sl interstitial prom/ scarring in right mid-lung vicinity of prev XRT, COPD, vol loss right base...  CT Chest > pending per Ryan Contreras IMP/PLAN>>  Erastus has severe chronic problems- followed regularly by Ryan Contreras, DrEllison Ryan Contreras;  He would be better from the pulm standpoint if he'd quit all smoking & take his prescribed meds regularly (med non-compliance confirmed w/ call to his Pharm today); we added a LAMA to his regimen- SPIRIVA once daily; advised to take meds regularly, incr exercise program; we plan rov recheck in 25mo..  ~  May 24, 2017:  662moOV & JoNazimeturns unaccompanied- states his breathing is pretty good, still smoking 1-2 cig/d, notes mild cough w/ small amt beige sput, no hemoptysis, chronic stable SOB/ DOE w/o change, denies CP;  He has [facility] O2 [facility] concentrator & small O2 tanks-- he uses the Oxygen Qhs ("it helps me sleep") and prn days;  He requests samples of his inhalers raising the spector of medication non-compliance: [facility] [place] he only filled the Symbicort in March & June (one mo supply), and has never filled the Spiriva!      He saw Ryan Contreras 12/19/16> note reviewed, hx squamous cell ca RUL w/ hilar adenopathy Dx 12/2015, treated w/ chemoradiation (7 cycles last dose 03/14/16), currently on observation; f/u CXR 11/22/16 showed stable interstitial prom in right mid lung, COPD, chr vol loss right lung base, no acute changes;  f/u CT Chest 11/30/16 showed further decr size of RUL pulm nodule, now 19x8m72mprev 21x11m67m& no new lesions...    He was seen 12/27/16 by DrEllison> note reviewed, wellness visit w/ the following recommendations-- minimize alcohol.  Do not use tobacco products.  Have a colonoscopy at least every 10 years from age 9. 56eep firearms safely stored.  Always use seat belts.  have working smoke alarms in your home.  See an eye doctor and dentist regularly.  Never drive under the influence of alcohol or drugs (including prescription drugs).  It is critically important to prevent falling down (keep floor areas well-lit, dry, and free of loose objects.  If you have a cane, walker, or wheelchair, you should use it, even for short trips around the house.  Wear flat-soled shoes.  Also, try not to rush).      Severe airflow obstruction w/ GOLD Stage3 COPD>  He is supposed to be on Symbicort160-2spBid & Spiriva daily but not filling the Rx regularly; must quit smoking & stay active... c/o DOE w/ some ADLs, no change, not exercising, mild cough/ sput (clear, no bl)/ no CP/ etc.    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    Lung Cancer>  diagnosed & treated 12/2015 for a non-small cell lung cancer in right hilum; given combined ChemoXRT by Ryan Contreras & Wentworth=>DrPalmero in [place]; f/u CT Chest scans by Ryan Contreras- notes reviewed.    On-going tobacco abuse>  He continues to smoke ?several cigs/d; we discussed smoking cessation options...     Cardiac>  HBP, CAD (  DrRoss) w/ RCA stent, extensive atherosclerosis in Ao etc; on ASA81, Plavix75 (restarted after 03/2016 GIB), Hyzaar100-12.5, K10.    GI>  [facility] 4/12 - 04/01/16 w/ Hg=4.3, required 5u transfusion & eval by GI- acute & chronic gastritis/ duodenitis & ?mass in duodenum but Bx was neg x inflamm (Bx also neg for HPylori but his HPylori Ab test was pos);  He was treated w/ triple therapy & improved,  they made the decision to restart his ASA/Plavix at disch due to his CAD & stent; Hg improved to the 9-10 range and this is to be followed up by his PCP- [place] (I reviewed records in Care Everywhere)...     Medical>  PCP is DrEllison> HL on Lip80, BPH w/ BOO, Elev PSA ([place]), Gout on Allopurinol300, & ADM to [facility] 03/2016 w/ GIB- gastitis, duodenitis, ?duodenal mass, +HPylori Ab test (neg Bx) and treated w/ triple therapy & improved (now taking Protonix40, Zantac150, Senakot-S); Anemia- on FeSO4...      EXAM shows Afeb, VSS, O2sat=95% on RA at rest;  HEENT- neg, Mallampati4;  Chest- decr BS bilat & few scat rhonchi, no w/r;  Heart- RR gr1/6 SEM, w/o /r/g heard;  Abd- soft, nontender;  Ext- VI, no c/c/e;  Neuro- non-focal.  Ambulatory oximetry 05/24/17>  O2sat=92% w/ pulse=86/min;  He ambulated 2 Laps in office (185'ea) & stopped w/ lowest O2sat=87% w/ pulse=105/min...  LABS 05/24/17>  Chems- HCO3=38 (pt off Diamox now), K=3.9, BS=112, Cr=0.81;  CBC- wnl;  Sed=40 IMP/PLAN>>  Ryan Contreras is his own worse enemy-- must quit all smoking, must take meds and inhalers regularly, and get on a good exercise program;  He needs his port O2 when ambulating;  Re-start the Symbicort/ Spiriva/ Diamox291m/d...  ~  July 19, 2017:  222moOV & Kingsly states that he is INTOL to the Diamox250, made him "drowsy" after 2 pills he says;  He has O2 to use nightly but "not using it EVERY night" & none during the days;  Still smoking 1-2 cig/d even though "they make me drowsy too sometimes";  He has Symbicort160- 2spBid & Incruse once daily, + GFN400-2Tid w/ fluids... He notes that his SOB/DOE is stable, notes min cough w/ some clear sput, no hemoptysis    He saw ONC-Ryan Contreras 06/19/17>  DIAGNOSIS: Unresectable Stage IIA (T1b, N1, M0) non-small cell lung cancer, squamous cell carcinoma presented with right upper lobe lung nodule and hilar lymphadenopathy diagnosed in January 2017.  PRIOR THERAPY: Course of concurrent chemoradiation  with weekly carboplatin for AUC of 2 and paclitaxel 45 MG/M2. Status post 7 cycles, last dose was given 03/14/2016 with partial response.  CURRENT THERAPY: Observation.  Follow up CT Chest 7/18 (see below) showed no evid of dis progression- they plan recheck in 53m85mo     His PCP is listed as DrEllison- seen 06/26/17>  DM2 on diet alone (A1c=6.3), HBP, CAD, HL, etc;  Annual medicare wellness visit, added Aricept5mg32mncr K20/d...    EXAM shows Afeb, VSS, O2sat=95% on RA at rest;  HEENT- neg, Mallampati4;  Chest- decr BS bilat & few scat rhonchi, no w/r;  Heart- RR gr1/6 SEM, w/o /r/g heard;  Abd- soft, nontender;  Ext- VI, no c/c/e;  Neuro- non-focal.  CT Chest 06/19/17>  IMPRESSION:  1) Decrease in nodular thickening about the RIGHT hilum. No new nodularity the RIGHT lung.  2) Stable nodular cystic changes in the superior segment LEFT lower lobe. Lesion relatively stable compared to PET-CT of 12/11/2015.  3) Benign lesion posterior aspect of  the RIGHT hepatic lobe. Liver has a nodular contour.  LABS in Epic July2018>  Chems- HCO3=35 off Diamox, BS=128 & A1c=6.3 on diet alone;  LFTs wnl;  CBC- wnl IMP/PLAN>>  Problems as noted above- he remains is own worst enemy w/ poor medication compliance & continue smoking; we reviewed & discussed both issues again;  Reminded to use the GFN400-2Tid regularly as well...   ~  November 23, 2017:  63moROv & pulm recheck>  Jasher reports that he "feels the same" w/ mild cough, small amt clear sput w/o blood, DOE w/ walking/ stairs etc; he denies CP, palpit, edema; he is still smoking 1-2 cig/d;  Meds include SYMBICORT160-2spBid and INCRUSE once daily but compliance has been an issue & he admits to NOT using the Incruse for the last several months;  I offered to change his regimen to NEB meds but he declines change at this time;  JArnoldosees DrEllison for DM & PCP- he has f/u appt 12/27/17;  He sees Ryan Contreras for Oncology- f/u lung cancer- he has f/u appt 12/18/17;  He sees DrPRoss for  Cards- he has f/u appt 12/22/17...  We reviewed the following medical problems during today's office visit >>     Severe airflow obstruction w/ GOLD Stage3 COPD>  He is supposed to be on Symbicort160-2spBid & Incruse daily but not filling the Rx regularly; still smoking 1-2 cig/d and too sedentary... c/o DOE w/ some ADLs, no change, not exercising, mild cough/ sput (clear, no bl)/ no CP/ etc=> I offered to switch him to NEB meds (covered under Medicare Part-B but he declines); reminded of the importance of NOT smoking & med compliance!    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    Lung Cancer>  diagnosed & treated 12/2015 for a non-small cell lung cancer in right hilum; given combined ChemoXRT by Ryan Contreras & Wentworth=>DrPalmero in [place]; f/u CT Chest scans by Ryan Contreras- notes reviewed (last seen 06/19/17) and he has f/u appt sched 12/18/17 w/ CT Chest already scheduled...    On-going tobacco abuse>  He continues to smoke ?several cigs/d; we discussed smoking cessation options...     Cardiac>  HBP, CAD (DrRoss) w/ RCA stent, extensive atherosclerosis in Ao etc; on ASA81, Plavix75 (restarted after 03/2016 GIB), Hyzaar100-12.5, K20.    GI>  [facility] 4/12 - 04/01/16 w/ Hg=4.3, required 5u transfusion & eval by GI- acute & chronic gastritis/ duodenitis & ?mass in duodenum but Bx was neg x inflamm (Bx also neg for HPylori but his HPylori Ab test was pos);  He was treated w/ triple therapy & improved, they made the decision to restart his ASA/Plavix at disch due to his CAD & stent; Hg improved to the 9-10 range and this is to be followed up by his PCP- DrBorgerI reviewed  records in Care Everywhere)...     Medical>  PCP is DrEllison> HL on Lip80, BPH w/ BOO (no meds), Elev PSA ([place]), Gout on Allopurinol100, & [facility] GIB- gastitis, duodenitis, ?duodenal mass, +HPylori Ab test (neg Bx) and treated w/ triple therapy & improved (now taking Protonix40, Zantac150, Senakot-S);  Anemia- on FeSO4... EXAM shows Afeb, VSS, O2sat=92% on RA at rest;  HEENT- neg, Mallampati4;  Chest- decr BS bilat & few scat rhonchi, no w/r;  Heart- RR gr1/6 SEM, w/o /r/g heard;  Abd- soft, nontender;  Ext- VI, no c/c/e;  Neuro- non-focal.  CXR 11/23/17 (independently reviewed by me in the PACS system) showed heart at upper lim of norm, hyperinflation c/w COPD, stable linear densities in right hilum- no acute changes...  LABS 11/23/17>  Chems- ok x HCO3=39, K=4.0, Cr=0.80;  CBC- OK w/ Hg=13.7WBC=8.2;   IMP/PLAN>>  It appears as though Kiyoshi's pCO2 is likely climbing off the diamox w/ HCO#=39 now and we discussed starting EPLERENONE 20m/d since he says he's INTOL to Diamox;  Once again I implored him to take his meds as prescribed- Symbicort160- 2spBid and Incruse once daily, QUIT all smoking, and increase his exercise program (join the Y, silver sneakers, etc); he has declined NEB meds; he is due for f/u CT Chest soon per Ryan Contreras + medical follow up visits w/ PCP-Ellison and CARDS-Ross...     Past Medical History  Diagnosis Date   COPD w/ severe airflow obstruction & GOLD Stage 3 COPD> now on Symbicort160-2spBid + Incruse once daily when he fills rx    Lung Cancer> right hilar mass found on low dose screening CT 11/2015, Bx= non small cell Ca (SCCa favored) & treated w/ chemoradiation completed 03/2016...   . CAD, NATIVE VESSEL >> followed by DrRoss, CATH 05/2012= 60%LAD, 40%D1, 40%Circ, 40%RCA, EF=55% w/ min inferobasal HK... He has Hx PTA/stent in RCA   . Gout, unspecified   . HYPERLIPIDEMIA   . HYPERTENSION   . PEPTIC ULCER DISEASE           [facility], Hg=4.3,  required 5u Tx, eval w/ gastitis & duodenitis w/ ?duodenal mass- neg Bx (+HPylori Ab test); treated w/ triple therapy & improved...   . TOBACCO ABUSE => counseled to quit all smoking   . Type 2 diabetes mellitus (HCC)     ANEMIA> acute GI blood loss anemia 03/2016 => on Fe     Past Surgical History:  Procedure Laterality Date  . CARDIAC CATHETERIZATION     '06-stent placed, 6'13 - Dr. PLizbeth Barkfollows  . CORONARY STENT PLACEMENT     s/p  . ENDOBRONCHIAL ULTRASOUND Bilateral 01/11/2016   Procedure: ENDOBRONCHIAL ULTRASOUND;  Surgeon: RCollene Gobble MD;  Location: [facility] ENDOSCOPY;  Service: Endoscopy;  Laterality: Bilateral;    Outpatient Encounter Medications as of 11/23/2017  Medication Sig  . acetaminophen (TYLENOL) 500 MG tablet Take 250 mg by mouth every 6 (six) hours as needed for moderate pain. Reported on 04/29/2016  . allopurinol (ZYLOPRIM) 100 MG tablet TAKE 1 TABLET BY MOUTH EVERY DAY  . aspirin 81 MG tablet Take 81 mg by mouth at bedtime. Reported on 05/02/2016  . atorvastatin (LIPITOR) 80 MG tablet TAKE 1 TABLET BY MOUTH EVERY DAY  . budesonide-formoterol (SYMBICORT) 160-4.5 MCG/ACT inhaler Inhale 2 puffs into the lungs 2 (two) times daily.  . clopidogrel (PLAVIX) 75 MG tablet Take 1 tablet (75 mg total) by mouth daily. Patient must keep 12/22/17 appointment for further refills  . donepezil (ARICEPT) 5 MG tablet Take 1 tablet (5 mg total) by mouth at bedtime.  .Marland Kitchenlosartan-hydrochlorothiazide (HYZAAR) 100-12.5 MG tablet TAKE 1 TABLET BY MOUTH DAILY.  . pantoprazole (PROTONIX) 40 MG tablet TAKE 1 TABLET BY MOUTH EVERY DAY  . potassium chloride SA (KLOR-CON M20) 20 MEQ tablet Take  1 tablet (20 mEq total) by mouth daily.  . ranitidine (ZANTAC) 150 MG tablet Take 150 mg by mouth daily.  Marland Kitchen senna-docusate (SENOKOT-S) 8.6-50 MG tablet Take 1 tablet by mouth at bedtime.   Marland Kitchen umeclidinium bromide (INCRUSE ELLIPTA) 62.5 MCG/INH AEPB Inhale 1 puff into the lungs daily.  . [DISCONTINUED]  budesonide-formoterol (SYMBICORT) 160-4.5 MCG/ACT inhaler Inhale 2 puffs into the lungs 2 (two) times daily.  . budesonide-formoterol (SYMBICORT) 160-4.5 MCG/ACT inhaler Inhale 2 puffs into the lungs 2 (two) times daily.  . ferrous sulfate 325 (65 FE) MG tablet Take 325 mg by mouth daily with breakfast.   No facility-administered encounter medications on file as of 11/23/2017.     No Known Allergies   Immunization History  Administered Date(s) Administered  . Influenza Whole 08/11/2010, 10/10/2012  . Influenza, High Dose Seasonal PF 08/22/2017  . Influenza,inj,Quad PF,6+ Mos 09/11/2013  . Influenza-Unspecified 09/11/2014, 09/14/2015, 09/30/2016  . Pneumococcal Conjugate-13 06/03/2009  . Pneumococcal Polysaccharide-23 08/11/2010  . Td 12/13/2002    Current Medications, Allergies, Past Medical History, Past Surgical History, Family History, and Social History were reviewed in [employer] record.   Review of Systems  Constitutional: Negative for fever and unexpected weight change.  HENT: Negative for congestion, dental problem, ear pain, nosebleeds, postnasal drip, rhinorrhea, sinus pressure, sneezing, sore throat and trouble swallowing.  Eyes: Negative for redness and itching.  Respiratory: Positive for cough and no wheezing. Negative for chest tightness and shortness of breath.  Cardiovascular: Negative for palpitations and leg swelling.  Gastrointestinal: Negative for nausea and vomiting.  Genitourinary: Negative for dysuria.  Musculoskeletal: Negative for joint swelling.  Skin: Negative for rash.  Neurological: Negative for headaches.  Hematological: Does not bruise/bleed easily.  Psychiatric/Behavioral: Negative for dysphoric mood. The patient is not nervous/anxious.    Objective:   Physical Exam  76 y/o gentleman, chr ill appearing, in NAD...  Nose without purulence or discharge noted Neck without lymphadenopathy or thyromegaly Chest with  decreased breath sounds, no active wheezing Cardiac exam with regular rate and rhythm Lower extremities without edema, no cyanosis Alert and oriented, moves all 4 extremities.   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    LUNG CANCER>  Non-small cell ca Dx 12/2015 & treated by Ryan Contreras & XRT    On-going tobacco abuse>  He continues to smoke ?several cigs/d; we discussed smoking cessation options...     Cardiac>  HBP, CAD (DrRoss) w/ RCA stent, extensive atherosclerosis in Ao etc; on ASA81, Plavix75 (restarted after 03/2016 GIB), Hyzaar100-12.5, K10.    Medical>  HL on Lip80, BPH w/ BOO, Elev PSA ([place]), Gout on Allopurinol300, & ADM to [facility] 03/2016 w/ GIB- gastitis, duodenitis, ?duodenal mass, +HPylori Ab test (neg Bx) and treated w/ triple therapy & improved (now taking Protonix40, Zantac150, Senakot-S); Anemia- on FeSO4...  PLAN >>  11/02/15>   Ryan Contreras desperately needs to quit smoking- discussed Nicotine replacement options and offered Chantix;  Breathing meds refilled;  I rec that he STOP the Lasix for now & substitute ACETAZOLAMIDE 276m one tab daily in the afternoon;  He has already had the 2016 Flu  vaccine;  Plan ROV in 42mosooner if needed prn. 05/23/16>   Problems as listed;  Ryan Contreras needs to quit all smoking & grad increase his exercise program;  Continue Dulera200-2spBid & add MUCINEX 6020mtabs- 2Bid w/ fluids;  He will maintain f/u w/ his PCP, Oncologist, & GI;  We plan recheck in 59m24moooner if needed for any reason...  11/22/16>   Ryan Contreras has severe chronic problems- followed regularly by Ryan Contreras, DrEllison etaHarriett SineHe would be better from the pulm standpoint if he'd quit  all smoking & take his prescribed meds regularly (med non-compliance confirmed w/ call to his Pharm today); advised to take meds regularly, incr exercise program; we plan rov recheck in 59mo44mo/13/18>    Ryan Contreras is his own worse enemy-- must quit all smoking, must take meds and inhalers regularly, and get on a good exercise program;  He needs his port O2 when ambulating;  Re-start the Symbicort/ Spiriva/ Diamox250mg87m. 07/19/17>   Problems as noted above- he remains is own worst enemy w/ poor medication compliance & continue smoking; we reviewed & discussed both issues again;  Reminded to use the GFN400-2Tid regularly as well... 11/23/17>   It appears as though Ryan Contreras's pCO2 is likely climbing off the diamox w/ HCO#=39 now and we discussed starting EPLERENONE 25mg/62mnce he says he's INTOL to Diamox;  Once again I implored him to take his meds as prescribed- Symbicort160- 2spBid and Incruse once daily, QUIT all smoking, and increase his exercise program (join the Y, silver sneakers, etc); he has declined NEB meds; he is due for f/u CT Chest soon per Ryan Contreras + medical follow up visits w/ PCP-Ellison and CARDS-Ross.   Plan:       Medication List        Accurate as of 11/23/17 11:59 PM. Always use your most recent med list.          acetaminophen 500 MG tablet Commonly known as:  TYLENOL   allopurinol 100 MG tablet Commonly known as:  ZYLOPRIM TAKE 1 TABLET BY MOUTH EVERY DAY   aspirin 81 MG tablet   atorvastatin 80 MG tablet Commonly known as:  LIPITOR TAKE 1 TABLET BY MOUTH EVERY DAY   * budesonide-formoterol 160-4.5 MCG/ACT inhaler Commonly known as:  SYMBICORT Inhale 2 puffs into the lungs 2 (two) times daily.   * budesonide-formoterol 160-4.5 MCG/ACT inhaler Commonly known as:  SYMBICORT Inhale 2 puffs into the lungs 2 (two) times daily.   clopidogrel 75 MG tablet Commonly known as:  PLAVIX Take 1 tablet (75 mg total) by mouth daily. Patient must keep 12/22/17 appointment for  further refills   donepezil 5 MG tablet Commonly known as:  ARICEPT Take 1 tablet (5 mg total) by mouth at bedtime.   ferrous sulfate 325 (65 FE) MG tablet   losartan-hydrochlorothiazide 100-12.5 MG tablet Commonly known as:  HYZAAR TAKE 1 TABLET BY MOUTH DAILY.   pantoprazole 40 MG tablet Commonly known as:  PROTONIX TAKE 1 TABLET BY MOUTH EVERY DAY   potassium chloride SA 20 MEQ tablet Commonly known as:  KLOR-CON M20 Take 1 tablet (20 mEq total) by mouth daily.   ranitidine 150 MG tablet Commonly known as:  ZANTAC   senna-docusate 8.6-50 MG tablet Commonly known as:  Senokot-S   umeclidinium bromide 62.5 MCG/INH Aepb Commonly known as:  INCRUSE ELLIPTA Inhale 1 puff into the lungs daily.      * This list has 2 medication(s) that are the same as other medications prescribed  for you. Read the directions carefully, and ask your doctor or other care provider to review them with you.          Where to Get Your Medications    Information about where to get these medications is not yet available   Ask your nurse or doctor about these medications  budesonide-formoterol 160-4.5 MCG/ACT inhaler

## 2017-11-28 ENCOUNTER — Telehealth: Payer: Self-pay | Admitting: Pulmonary Disease

## 2017-11-28 NOTE — Telephone Encounter (Signed)
Attempted to contact pt. No answer, no option to leave a message. Will try back.  

## 2017-11-29 NOTE — Telephone Encounter (Signed)
Attempted to contact pt x2. No answer, no option to leave a message. Will try back.

## 2017-11-30 NOTE — Telephone Encounter (Signed)
atc pt X3, no answer and no vm.  Will close encounter per triage protocol.

## 2017-12-15 ENCOUNTER — Telehealth: Payer: Self-pay | Admitting: Pulmonary Disease

## 2017-12-15 NOTE — Telephone Encounter (Signed)
Spoke with pt. He is requesting Symbicort samples. Samples have been left up front for pick up. Nothing further was needed.

## 2017-12-18 ENCOUNTER — Inpatient Hospital Stay: Payer: Medicare Other

## 2017-12-18 ENCOUNTER — Ambulatory Visit (HOSPITAL_COMMUNITY)
Admission: RE | Admit: 2017-12-18 | Discharge: 2017-12-18 | Disposition: A | Discharge status: Home / Self Care | Payer: Medicare Other | Source: Ambulatory Visit | Attending: Internal Medicine | Admitting: Internal Medicine

## 2017-12-18 ENCOUNTER — Inpatient Hospital Stay: Payer: Medicare Other | Attending: Internal Medicine | Admitting: Internal Medicine

## 2017-12-18 ENCOUNTER — Telehealth: Payer: Self-pay | Admitting: Internal Medicine

## 2017-12-18 ENCOUNTER — Telehealth: Payer: Self-pay | Admitting: *Deleted

## 2017-12-18 ENCOUNTER — Encounter: Payer: Self-pay | Admitting: Internal Medicine

## 2017-12-18 DIAGNOSIS — E119 Type 2 diabetes mellitus without complications: Secondary | ICD-10-CM | POA: Diagnosis not present

## 2017-12-18 DIAGNOSIS — Z8711 Personal history of peptic ulcer disease: Secondary | ICD-10-CM | POA: Diagnosis not present

## 2017-12-18 DIAGNOSIS — R911 Solitary pulmonary nodule: Secondary | ICD-10-CM | POA: Insufficient documentation

## 2017-12-18 DIAGNOSIS — C3491 Malignant neoplasm of unspecified part of right bronchus or lung: Secondary | ICD-10-CM | POA: Insufficient documentation

## 2017-12-18 DIAGNOSIS — R918 Other nonspecific abnormal finding of lung field: Secondary | ICD-10-CM | POA: Insufficient documentation

## 2017-12-18 DIAGNOSIS — Z85118 Personal history of other malignant neoplasm of bronchus and lung: Secondary | ICD-10-CM | POA: Insufficient documentation

## 2017-12-18 DIAGNOSIS — C3490 Malignant neoplasm of unspecified part of unspecified bronchus or lung: Secondary | ICD-10-CM

## 2017-12-18 DIAGNOSIS — J432 Centrilobular emphysema: Secondary | ICD-10-CM | POA: Diagnosis not present

## 2017-12-18 DIAGNOSIS — I2510 Atherosclerotic heart disease of native coronary artery without angina pectoris: Secondary | ICD-10-CM | POA: Insufficient documentation

## 2017-12-18 DIAGNOSIS — Z7982 Long term (current) use of aspirin: Secondary | ICD-10-CM | POA: Diagnosis not present

## 2017-12-18 DIAGNOSIS — J984 Other disorders of lung: Secondary | ICD-10-CM | POA: Insufficient documentation

## 2017-12-18 DIAGNOSIS — J449 Chronic obstructive pulmonary disease, unspecified: Secondary | ICD-10-CM | POA: Diagnosis not present

## 2017-12-18 DIAGNOSIS — I10 Essential (primary) hypertension: Secondary | ICD-10-CM | POA: Diagnosis not present

## 2017-12-18 DIAGNOSIS — Z9221 Personal history of antineoplastic chemotherapy: Secondary | ICD-10-CM | POA: Insufficient documentation

## 2017-12-18 DIAGNOSIS — I700 Atherosclerosis of aorta: Secondary | ICD-10-CM | POA: Diagnosis not present

## 2017-12-18 DIAGNOSIS — J438 Other emphysema: Secondary | ICD-10-CM | POA: Insufficient documentation

## 2017-12-18 DIAGNOSIS — Z923 Personal history of irradiation: Secondary | ICD-10-CM | POA: Diagnosis not present

## 2017-12-18 DIAGNOSIS — E785 Hyperlipidemia, unspecified: Secondary | ICD-10-CM | POA: Diagnosis not present

## 2017-12-18 DIAGNOSIS — Z79899 Other long term (current) drug therapy: Secondary | ICD-10-CM | POA: Insufficient documentation

## 2017-12-18 LAB — CBC WITH DIFFERENTIAL/PLATELET
ABS GRANULOCYTE: 3.8 10*3/uL (ref 1.5–6.5)
BASOS PCT: 1 %
Basophils Absolute: 0.1 10*3/uL (ref 0.0–0.1)
Eosinophils Absolute: 0.3 10*3/uL (ref 0.0–0.5)
Eosinophils Relative: 4 %
HEMATOCRIT: 43.5 % (ref 38.4–49.9)
HEMOGLOBIN: 14.1 g/dL (ref 13.0–17.1)
LYMPHS PCT: 34 %
Lymphs Abs: 2.7 10*3/uL (ref 0.9–3.3)
MCH: 32 pg (ref 27.2–33.4)
MCHC: 32.4 g/dL (ref 32.0–36.0)
MCV: 98.6 fL — AB (ref 79.3–98.0)
Monocytes Absolute: 1.2 10*3/uL — ABNORMAL HIGH (ref 0.1–0.9)
Monocytes Relative: 14 %
NEUTROS ABS: 3.8 10*3/uL (ref 1.5–6.5)
Neutrophils Relative %: 47 %
Platelets: 263 10*3/uL (ref 140–400)
RBC: 4.41 MIL/uL (ref 4.20–5.82)
RDW: 14.6 % (ref 11.0–15.6)
WBC: 8 10*3/uL (ref 4.0–10.3)

## 2017-12-18 LAB — COMPREHENSIVE METABOLIC PANEL
ALBUMIN: 3.8 g/dL (ref 3.5–5.0)
ALT: 19 U/L (ref 0–55)
ANION GAP: 8 (ref 3–11)
AST: 22 U/L (ref 5–34)
Alkaline Phosphatase: 102 U/L (ref 40–150)
BILIRUBIN TOTAL: 0.5 mg/dL (ref 0.2–1.2)
BUN: 13 mg/dL (ref 7–26)
CHLORIDE: 99 mmol/L (ref 98–109)
CO2: 32 mmol/L — ABNORMAL HIGH (ref 22–29)
Calcium: 9.2 mg/dL (ref 8.4–10.4)
Creatinine, Ser: 0.92 mg/dL (ref 0.70–1.30)
GFR calc Af Amer: >60 mL/min (ref >60)
Glucose, Bld: 106 mg/dL (ref 70–140)
POTASSIUM: 4.4 mmol/L (ref 3.5–5.1)
Sodium: 139 mmol/L (ref 136–145)
TOTAL PROTEIN: 7.7 g/dL (ref 6.4–8.3)

## 2017-12-18 MED ORDER — IOPAMIDOL (ISOVUE-300) INJECTION 61%
INTRAVENOUS | Status: AC
  Filled 2017-12-18: qty 75

## 2017-12-18 MED ORDER — IOPAMIDOL (ISOVUE-300) INJECTION 61%
75.0000 mL | Freq: Once | INTRAVENOUS | Status: AC | PRN
  Administered 2017-12-18: 75 mL via INTRAVENOUS

## 2017-12-18 NOTE — Progress Notes (Signed)
[place] [phone]   [phone]  OFFICE PROGRESS NOTE  Renato Shin, MD [address] [place] [address]  DIAGNOSIS: Unresectable Stage IIA (T1b, N1, M0) non-small cell lung cancer, squamous cell carcinoma presented with right upper lobe lung nodule and hilar lymphadenopathy diagnosed in January 2017.  PRIOR THERAPY: Course of concurrent chemoradiation with weekly carboplatin for AUC of 2 and paclitaxel 45 MG/M2. Status post 7 cycles, last dose was given 03/14/2016 with partial response.  CURRENT THERAPY: Observation.  INTERVAL HISTORY: Ryan Contreras 77 y.o. male returns to the clinic today for follow-up visit accompanied by his wife.  The patient is feeling fine today with no specific complaints except for shortness of breath with exertion.  He denied having any chest pain, cough or hemoptysis.  He denied having any fever or chills.  He has no nausea, vomiting, diarrhea or constipation.  He has been on observation since April 2017.  He had repeat CT scan of the chest performed recently and he is here for evaluation and discussion of his discuss results.   MEDICAL HISTORY: Past Medical History:  Diagnosis Date  . CAD, NATIVE VESSEL    coronary stent x1-tx. Plavix use.  Marland Kitchen COPD (chronic obstructive pulmonary disease) ([place])    due to 50+ years Smoking.  . Gastrointestinal bleeding, upper 04/29/2016  . Gout, unspecified    foot  . HYPERLIPIDEMIA   . HYPERTENSION   . NSCL ca dx'd 12/2015  . PEPTIC ULCER DISEASE   . TOBACCO ABUSE   . Type 2 diabetes mellitus ([place])    denies on 01-06-16    ALLERGIES:  has No Known Allergies.  MEDICATIONS:  Current Outpatient Medications  Medication Sig Dispense Refill  . acetaminophen (TYLENOL) 500 MG tablet Take 250 mg by mouth every 6 (six) hours as needed for moderate pain. Reported on 04/29/2016    . allopurinol (ZYLOPRIM) 100 MG tablet TAKE 1 TABLET BY MOUTH EVERY DAY 90 tablet 3    . aspirin 81 MG tablet Take 81 mg by mouth at bedtime. Reported on 05/02/2016    . atorvastatin (LIPITOR) 80 MG tablet TAKE 1 TABLET BY MOUTH EVERY DAY 30 tablet 2  . budesonide-formoterol (SYMBICORT) 160-4.5 MCG/ACT inhaler Inhale 2 puffs into the lungs 2 (two) times daily. 1 Inhaler 6  . budesonide-formoterol (SYMBICORT) 160-4.5 MCG/ACT inhaler Inhale 2 puffs into the lungs 2 (two) times daily. 1 Inhaler 0  . clopidogrel (PLAVIX) 75 MG tablet Take 1 tablet (75 mg total) by mouth daily. Patient must keep 12/22/17 appointment for further refills 30 tablet 2  . donepezil (ARICEPT) 5 MG tablet Take 1 tablet (5 mg total) by mouth at bedtime. 30 tablet 11  . eplerenone (INSPRA) 25 MG tablet Take 1 tablet (25 mg total) by mouth daily. 30 tablet 5  . ferrous sulfate 325 (65 FE) MG tablet Take 325 mg by mouth daily with breakfast.    . losartan-hydrochlorothiazide (HYZAAR) 100-12.5 MG tablet TAKE 1 TABLET BY MOUTH DAILY. 90 tablet 2  . pantoprazole (PROTONIX) 40 MG tablet TAKE 1 TABLET BY MOUTH EVERY DAY 90 tablet 3  . potassium chloride SA (KLOR-CON M20) 20 MEQ tablet Take 1 tablet (20 mEq total) by mouth daily. 30 tablet 11  . ranitidine (ZANTAC) 150 MG tablet Take 150 mg by mouth daily.  1  . senna-docusate (SENOKOT-S) 8.6-50 MG tablet Take 1 tablet by mouth at bedtime.     Marland Kitchen umeclidinium bromide (INCRUSE  ELLIPTA) 62.5 MCG/INH AEPB Inhale 1 puff into the lungs daily. 2 each 0   No current facility-administered medications for this visit.    Facility-Administered Medications Ordered in Other Visits  Medication Dose Route Frequency Provider Last Rate Last Dose  . iopamidol (ISOVUE-300) 61 % injection             SURGICAL HISTORY:  Past Surgical History:  Procedure Laterality Date  . CARDIAC CATHETERIZATION     '06-stent placed, 6'13 - Dr. Lizbeth Bark follows  . CORONARY STENT PLACEMENT     s/p  . ENDOBRONCHIAL ULTRASOUND Bilateral 01/11/2016   Procedure: ENDOBRONCHIAL ULTRASOUND;  Surgeon: Collene Gobble, MD;  Location: [facility];  Service: Endoscopy;  Laterality: Bilateral;    REVIEW OF SYSTEMS:  A comprehensive review of systems was negative except for: Respiratory: positive for dyspnea on exertion   PHYSICAL EXAMINATION: General appearance: alert, cooperative and no distress Head: Normocephalic, without obvious abnormality, atraumatic Neck: no adenopathy, no JVD, supple, symmetrical, trachea midline and thyroid not enlarged, symmetric, no tenderness/mass/nodules Lymph nodes: Cervical, supraclavicular, and axillary nodes normal. Resp: clear to auscultation bilaterally Back: symmetric, no curvature. ROM normal. No CVA tenderness. Cardio: regular rate and rhythm, S1, S2 normal, no murmur, click, rub or gallop GI: soft, non-tender; bowel sounds normal; no masses,  no organomegaly Extremities: extremities normal, atraumatic, no cyanosis or edema  ECOG PERFORMANCE STATUS: 1 - Symptomatic but completely ambulatory  Blood pressure (!) 142/67, pulse 97, temperature 98.7 F (37.1 C), temperature source Oral, resp. rate 18, height 6\' 1"  (1.854 m), weight 199 lb 12.8 oz (90.6 kg), SpO2 90 %.  LABORATORY DATA: Lab Results  Component Value Date   WBC 8.0 12/18/2017   HGB 14.1 12/18/2017   HCT 43.5 12/18/2017   MCV 98.6 (H) 12/18/2017   PLT 263 12/18/2017      Chemistry      Component Value Date/Time   NA 139 12/18/2017 0811   NA 141 06/16/2017 0906   K 4.4 12/18/2017 0811   K 3.4 (L) 06/16/2017 0906   CL 99 12/18/2017 0811   CO2 32 (H) 12/18/2017 0811   CO2 35 (H) 06/16/2017 0906   BUN 13 12/18/2017 0811   BUN 10.3 06/16/2017 0906   CREATININE 0.92 12/18/2017 0811   CREATININE 0.8 06/16/2017 0906      Component Value Date/Time   CALCIUM 9.2 12/18/2017 0811   CALCIUM 9.3 06/16/2017 0906   ALKPHOS 102 12/18/2017 0811   ALKPHOS 87 06/16/2017 0906   AST 22 12/18/2017 0811   AST 21 06/16/2017 0906   ALT 19 12/18/2017 0811   ALT 15 06/16/2017 0906   BILITOT 0.5  12/18/2017 0811   BILITOT 0.46 06/16/2017 0906       RADIOGRAPHIC STUDIES: Dg Chest 2 View  Result Date: 11/23/2017 CLINICAL DATA:  COPD, hypertension, lung cancer EXAM: CHEST  2 VIEW COMPARISON:  Chest CT 06/19/2017 FINDINGS: There is hyperinflation of the lungs compatible with COPD. Stable linear densities in the region of the right hilum. No acute confluent airspace opacities or effusions. Heart is upper limits normal in size. No acute bony abnormality. IMPRESSION: COPD/chronic changes.  No active disease. Electronically Signed   By: Rolm Baptise M.D.   On: 11/23/2017 11:35   Ct Chest W Contrast  Result Date: 12/18/2017 CLINICAL DATA:  77 year old male with history of non-small cell lung cancer diagnosed in January 2017 status post chemotherapy (completed in April 2018). Followup study. EXAM: CT CHEST WITH CONTRAST TECHNIQUE: Multidetector CT  imaging of the chest was performed during intravenous contrast administration. CONTRAST:  19mL ISOVUE-300 IOPAMIDOL (ISOVUE-300) INJECTION 61% COMPARISON:  Chest CT 06/19/2017. FINDINGS: Cardiovascular: Heart size is normal. There is no significant pericardial fluid, thickening or pericardial calcification. There is aortic atherosclerosis, as well as atherosclerosis of the great vessels of the mediastinum and the coronary arteries, including calcified atherosclerotic plaque in the left main, left anterior descending, left circumflex and right coronary arteries. Mediastinum/Nodes: No pathologically enlarged mediastinal or hilar lymph nodes. Esophagus is unremarkable in appearance. No axillary lymphadenopathy. Lungs/Pleura: Compared to the prior examination the previously noted nodule in the central aspect of the right upper lobe has significantly increased in size, currently measuring 2.9 x 1.7 x 1.8 cm (axial image 69 of series 7 and coronal image 53 of series 5), demonstrating macrolobulated and spiculated margins. Extending it outward from this lesion into the  more distal lung parenchyma there are band-like areas of septal thickening, likely to reflect some developing mild postobstructive changes. In the medial aspect of the superior segment of the left lower lobe (axial image 80 of series 7) there is a mixed solid and subsolid lesion which is predominantly ground-glass attenuation and septal thickening measuring 17 x 23 mm, but has an internal solid component measuring up to 9 mm (axial image 82 of series 2), and has clearly enlarged compared to prior examinations, highly concerning for slow-growing primary bronchogenic adenocarcinoma. No acute consolidative airspace disease. No pleural effusions. Diffuse bronchial wall thickening with mild to moderate centrilobular and paraseptal emphysema. Upper Abdomen: Intermediate attenuation lesion associated with the liver capsule adjacent to segment 7 of the liver is similar to prior studies, likely a mildly proteinaceous cyst, measuring 3.0 x 5.0 cm. Musculoskeletal: There are no aggressive appearing lytic or blastic lesions noted in the visualized portions of the skeleton. IMPRESSION: 1. Findings are highly concerning for local recurrence of disease in the central aspect of the right upper lobe, within an enlarging 2.9 x 1.7 cm nodule. No mediastinal or hilar lymphadenopathy noted at this time. 2. In addition, there is an enlarging mixed solid and subsolid lesion in the medial aspect of the superior segment of the left lower lobe which is highly concerning for a slowly growing primary bronchogenic adenocarcinoma. 3. Aortic atherosclerosis, in addition to left main and 3 vessel coronary artery disease. Assessment for potential risk factor modification, dietary therapy or pharmacologic therapy may be warranted, if clinically indicated. 4. Mild diffuse bronchial wall thickening with mild to moderate centrilobular and paraseptal emphysema. 5. Additional incidental findings, similar prior studies, as above. These results will be  called to the ordering clinician or representative by the Radiologist Assistant, and communication documented in the PACS or zVision Dashboard. Aortic Atherosclerosis (ICD10-I70.0) and Emphysema (ICD10-J43.9). Electronically Signed   By: Vinnie Langton M.D.   On: 12/18/2017 10:56    ASSESSMENT AND PLAN:  This is a very pleasant 77 years old African-American male with unresectable a stage II a non-small cell lung cancer, squamous cell carcinoma status post a course of concurrent chemoradiation.  The patient has been in observation since April 2017. Repeat CT scan of the chest showed enlargement of right upper lobe lung nodule suspicious for disease recurrence. I personally and independently reviewed the scans and discussed the results with the patient and his wife.  I recommended for him to have a PET scan performed for further evaluation of this lesion and to rule out disease recurrence. I will see him back for follow-up visit in 3 weeks  for evaluation and discussion of the PET scan results and further recommendation regarding his condition. He was advised to call immediately if he has any concerning symptoms in the interval. The patient voices understanding of current disease status and treatment options and is in agreement with the current care plan. All questions were answered. The patient knows to call the clinic with any problems, questions or concerns. We can certainly see the patient much sooner if necessary. I spent 10 minutes counseling the patient face to face. The total time spent in the appointment was 15 minutes.  Disclaimer: This note was dictated with voice recognition software. Similar sounding words can inadvertently be transcribed and may be missed upon review.

## 2017-12-18 NOTE — Telephone Encounter (Signed)
Call report received from [facility] at 1112 am to ensure provider aware of impression number one and two of today's CT scan.  At this time note F/U Ttoday at 11:30 am with provider for review of results.

## 2017-12-18 NOTE — Telephone Encounter (Signed)
Scheduled appt per 1/7 los - Gave patient AVS And calender per los. Central radiology to contact patient with PET schedule.

## 2017-12-22 ENCOUNTER — Ambulatory Visit (INDEPENDENT_AMBULATORY_CARE_PROVIDER_SITE_OTHER): Payer: Medicare Other | Admitting: Internal Medicine

## 2017-12-22 ENCOUNTER — Encounter: Payer: Self-pay | Admitting: Internal Medicine

## 2017-12-22 VITALS — BP 142/76 | HR 85 | Ht 73.0 in | Wt 197.0 lb

## 2017-12-22 DIAGNOSIS — I2510 Atherosclerotic heart disease of native coronary artery without angina pectoris: Secondary | ICD-10-CM | POA: Diagnosis not present

## 2017-12-22 DIAGNOSIS — E782 Mixed hyperlipidemia: Secondary | ICD-10-CM | POA: Diagnosis not present

## 2017-12-22 DIAGNOSIS — J42 Unspecified chronic bronchitis: Secondary | ICD-10-CM | POA: Diagnosis not present

## 2017-12-22 DIAGNOSIS — I10 Essential (primary) hypertension: Secondary | ICD-10-CM

## 2017-12-22 NOTE — Progress Notes (Signed)
Cardiology Office Note   Date:  12/22/2017   ID:  Ryan Contreras, DOB 02-Apr-1941, MRN [id number]  PCP:  Renato Shin, MD  Cardiologist:   Dorris Carnes, MD   F/U of CAD    History of Present Illness: Ryan Contreras is a 77 y.o. male with a history of CAD, HTN, HL, CV diseae, tobacco abuse, COPD, lung cancer   Last cath in 2013 (LAD 60% mid; D1 40%; LC 40% OM1 40%; RCA stent patent) The pt was last in cardiology clinic in July 2017  Since he was seen he denies CP  Breathing is stable   He continues to smoke     Current Meds  Medication Sig  . acetaminophen (TYLENOL) 500 MG tablet Take 250 mg by mouth every 6 (six) hours as needed for moderate pain. Reported on 04/29/2016  . allopurinol (ZYLOPRIM) 100 MG tablet TAKE 1 TABLET BY MOUTH EVERY DAY  . aspirin 81 MG tablet Take 81 mg by mouth at bedtime. Reported on 05/02/2016  . atorvastatin (LIPITOR) 80 MG tablet TAKE 1 TABLET BY MOUTH EVERY DAY  . budesonide-formoterol (SYMBICORT) 160-4.5 MCG/ACT inhaler Inhale 2 puffs into the lungs 2 (two) times daily.  . budesonide-formoterol (SYMBICORT) 160-4.5 MCG/ACT inhaler Inhale 2 puffs into the lungs 2 (two) times daily.  . clopidogrel (PLAVIX) 75 MG tablet Take 1 tablet (75 mg total) by mouth daily. Patient must keep 12/22/17 appointment for further refills  . donepezil (ARICEPT) 5 MG tablet Take 1 tablet (5 mg total) by mouth at bedtime.  Marland Kitchen eplerenone (INSPRA) 25 MG tablet Take 1 tablet (25 mg total) by mouth daily.  . ferrous sulfate 325 (65 FE) MG tablet Take 325 mg by mouth daily with breakfast.  . losartan-hydrochlorothiazide (HYZAAR) 100-12.5 MG tablet TAKE 1 TABLET BY MOUTH DAILY.  . pantoprazole (PROTONIX) 40 MG tablet TAKE 1 TABLET BY MOUTH EVERY DAY  . potassium chloride SA (KLOR-CON M20) 20 MEQ tablet Take 1 tablet (20 mEq total) by mouth daily.  . ranitidine (ZANTAC) 150 MG tablet Take 150 mg by mouth daily.  Marland Kitchen senna-docusate (SENOKOT-S) 8.6-50 MG tablet Take 1 tablet by mouth at  bedtime.   Marland Kitchen umeclidinium bromide (INCRUSE ELLIPTA) 62.5 MCG/INH AEPB Inhale 1 puff into the lungs daily.     Allergies:   Patient has no known allergies.   Past Medical History:  Diagnosis Date  . CAD, NATIVE VESSEL    coronary stent x1-tx. Plavix use.  Marland Kitchen COPD (chronic obstructive pulmonary disease) ([place])    due to 50+ years Smoking.  . Gastrointestinal bleeding, upper 04/29/2016  . Gout, unspecified    foot  . HYPERLIPIDEMIA   . HYPERTENSION   . NSCL ca dx'd 12/2015  . PEPTIC ULCER DISEASE   . TOBACCO ABUSE   . Type 2 diabetes mellitus ([place])    denies on 01-06-16    Past Surgical History:  Procedure Laterality Date  . CARDIAC CATHETERIZATION     '06-stent placed, 6'13 - Dr. Lizbeth Bark follows  . CORONARY STENT PLACEMENT     s/p  . ENDOBRONCHIAL ULTRASOUND Bilateral 01/11/2016   Procedure: ENDOBRONCHIAL ULTRASOUND;  Surgeon: Collene Gobble, MD;  Location: [facility];  Service: Endoscopy;  Laterality: Bilateral;     Social History:  The patient  reports that he has been smoking cigarettes.  He has a 25.00 pack-year smoking history. he has never used smokeless tobacco. He reports that he drinks alcohol. He reports that he does not use drugs.  Family History:  The patient's family history includes Arthritis in his mother; Cancer in his brother and sister; Heart attack in his father; Hypertension in his father.    ROS:  Please see the history of present illness. All other systems are reviewed and  Negative to the above problem except as noted.    PHYSICAL EXAM: VS:  BP (!) 142/76   Pulse 85   Ht 6\' 1"  (1.854 m)   Wt 197 lb (89.4 kg)   BMI 25.99 kg/m   GEN: Well nourished, well developed, in no acute distress  HEENT: normal  Neck:  JVP normal , carotid bruits, or masses Cardiac: RRR; no murmurs, rubs, or gallops,no edema  Respiratory:  Decreased airflow  GI: soft, nontender, nondistended, + BS  No hepatomegaly  MS: no deformity Moving all extremities   Skin: warm and  dry, no rash Neuro:  Strength and sensation are intact Psych: euthymic mood, full affect   EKG:  EKG is ordered today.  SR 85 bpm     Lipid Panel    Component Value Date/Time   CHOL 153 12/27/2016 0906   TRIG 54.0 12/27/2016 0906   TRIG 36 11/28/2006 0837   HDL 60.90 12/27/2016 0906   CHOLHDL 3 12/27/2016 0906   VLDL 10.8 12/27/2016 0906   LDLCALC 81 12/27/2016 0906      Wt Readings from Last 3 Encounters:  12/22/17 197 lb (89.4 kg)  12/18/17 199 lb 12.8 oz (90.6 kg)  11/23/17 199 lb (90.3 kg)      ASSESSMENT AND PLAN:  1  CAD  Last heart cath in 2018  Mod LAD dz LCx 40%  RCA 40%  LVEF normal   Stent patent    I am not convinced of any angina  I would keep on current regimen including ASA and Plavix (due to instent restenosis)  2  Lung CA  Followed in Onc clnic   Unfortunately recent CT scan shows enlargemnt in lung lesion   Followed closely by Ryan Contreras   Has appt in a couple wks  3  HL  Continue lipitor 80  LDL 81  With ongoing medical issues I would not push further for now.  4  HTN  BP is fair  Follow  Continue Keep on current regimen  Labs OK    F/U in November 2018   Current medicines are reviewed at length with the patient today.  The patient does not have concerns regarding medicines.  Signed, Dorris Carnes, MD  12/22/2017 8:48 AM    [address], [place],   [address] Phone: [phone]; Fax: [phone]

## 2017-12-22 NOTE — Patient Instructions (Signed)
Your physician recommends that you continue on your current medications as directed. Please refer to the Current Medication list given to you today. Your physician wants you to follow-up in: November, 2019 with Dr. Harrington Challenger.  You will receive a reminder letter in the mail two months in advance. If you don't receive a letter, please call our office to schedule the follow-up appointment.

## 2017-12-27 ENCOUNTER — Telehealth: Payer: Self-pay | Admitting: Endocrinology

## 2017-12-27 ENCOUNTER — Ambulatory Visit (INDEPENDENT_AMBULATORY_CARE_PROVIDER_SITE_OTHER): Payer: Medicare Other | Admitting: Endocrinology

## 2017-12-27 ENCOUNTER — Encounter: Payer: Self-pay | Admitting: Endocrinology

## 2017-12-27 VITALS — BP 124/72 | HR 92 | Wt 197.0 lb

## 2017-12-27 DIAGNOSIS — Z23 Encounter for immunization: Secondary | ICD-10-CM | POA: Diagnosis not present

## 2017-12-27 DIAGNOSIS — R413 Other amnesia: Secondary | ICD-10-CM | POA: Diagnosis not present

## 2017-12-27 DIAGNOSIS — R972 Elevated prostate specific antigen [PSA]: Secondary | ICD-10-CM | POA: Diagnosis not present

## 2017-12-27 DIAGNOSIS — E1159 Type 2 diabetes mellitus with other circulatory complications: Secondary | ICD-10-CM | POA: Diagnosis not present

## 2017-12-27 DIAGNOSIS — Z0000 Encounter for general adult medical examination without abnormal findings: Secondary | ICD-10-CM

## 2017-12-27 DIAGNOSIS — R110 Nausea: Secondary | ICD-10-CM | POA: Diagnosis not present

## 2017-12-27 DIAGNOSIS — T438X5A Adverse effect of other psychotropic drugs, initial encounter: Secondary | ICD-10-CM | POA: Diagnosis not present

## 2017-12-27 DIAGNOSIS — Z0001 Encounter for general adult medical examination with abnormal findings: Secondary | ICD-10-CM | POA: Diagnosis not present

## 2017-12-27 DIAGNOSIS — M1A9XX0 Chronic gout, unspecified, without tophus (tophi): Secondary | ICD-10-CM | POA: Diagnosis not present

## 2017-12-27 LAB — POCT GLYCOSYLATED HEMOGLOBIN (HGB A1C): Hemoglobin A1C: 6.4

## 2017-12-27 LAB — URINALYSIS, ROUTINE W REFLEX MICROSCOPIC
BILIRUBIN URINE: NEGATIVE
HGB URINE DIPSTICK: NEGATIVE
Ketones, ur: NEGATIVE
LEUKOCYTES UA: NEGATIVE
Nitrite: NEGATIVE
PH: 6 (ref 5.0–8.0)
RBC / HPF: NONE SEEN (ref 0–?)
Specific Gravity, Urine: 1.015 (ref 1.000–1.030)
TOTAL PROTEIN, URINE-UPE24: NEGATIVE
Urine Glucose: NEGATIVE
Urobilinogen, UA: 0.2 (ref 0.0–1.0)

## 2017-12-27 LAB — TSH: TSH: 1.59 u[IU]/mL (ref 0.35–4.50)

## 2017-12-27 LAB — MICROALBUMIN / CREATININE URINE RATIO
CREATININE, U: 104.1 mg/dL
MICROALB/CREAT RATIO: 0.7 mg/g (ref 0.0–30.0)
Microalb, Ur: <0.7 mg/dL (ref 0.0–1.9)

## 2017-12-27 LAB — PSA: PSA: 13.9 ng/mL — AB (ref 0.10–4.00)

## 2017-12-27 LAB — LIPID PANEL
CHOL/HDL RATIO: 3
Cholesterol: 167 mg/dL (ref 0–200)
HDL: 60.9 mg/dL (ref >39.00)
LDL Cholesterol: 86 mg/dL (ref 0–99)
NONHDL: 105.89
TRIGLYCERIDES: 97 mg/dL (ref 0.0–149.0)
VLDL: 19.4 mg/dL (ref 0.0–40.0)

## 2017-12-27 LAB — URIC ACID: Uric Acid, Serum: 5.7 mg/dL (ref 4.0–7.8)

## 2017-12-27 MED ORDER — DONEPEZIL HCL 10 MG PO TABS: 10.0000 mg | ORAL_TABLET | Freq: Every day | ORAL | 11 refills | Status: DC

## 2017-12-27 NOTE — Patient Instructions (Signed)
Please consider these measures for your health:  minimize alcohol.  Do not use tobacco products.  Have a colonoscopy at least every 10 years from age 77.  Keep firearms safely stored.  Always use seat belts.  have working smoke alarms in your home.  See an eye doctor and dentist regularly.  Never drive under the influence of alcohol or drugs (including prescription drugs).   blood tests are requested for you today.  We'll let you know about the results. I have sent a prescription to your pharmacy, to double the donepezil. Please come back for a follow-up appointment in 6 months (on or after 06/26/18).

## 2017-12-27 NOTE — Progress Notes (Signed)
Subjective:    Patient ID: Ryan Contreras, male    DOB: Jan 26, 1941, 77 y.o.   MRN: [id number]  HPI Pt is here for regular wellness examination, and is feeling pretty well in general, and says chronic med probs are stable, except as noted below Past Medical History:  Diagnosis Date  . CAD, NATIVE VESSEL    coronary stent x1-tx. Plavix use.  Marland Kitchen COPD (chronic obstructive pulmonary disease) ([place])    due to 50+ years Smoking.  . Gastrointestinal bleeding, upper 04/29/2016  . Gout, unspecified    foot  . HYPERLIPIDEMIA   . HYPERTENSION   . NSCL ca dx'd 12/2015  . PEPTIC ULCER DISEASE   . TOBACCO ABUSE   . Type 2 diabetes mellitus ([place])    denies on 01-06-16    Past Surgical History:  Procedure Laterality Date  . CARDIAC CATHETERIZATION     '06-stent placed, 6'13 - Dr. Lizbeth Bark follows  . CORONARY STENT PLACEMENT     s/p  . ENDOBRONCHIAL ULTRASOUND Bilateral 01/11/2016   Procedure: ENDOBRONCHIAL ULTRASOUND;  Surgeon: Collene Gobble, MD;  Location: [facility];  Service: Endoscopy;  Laterality: Bilateral;    Social History   Socioeconomic History  . Marital status: Divorced    Spouse name: Not on file  . Number of children: 2  . Years of education: Not on file  . Highest education level: Not on file  Social Needs  . Financial resource strain: Not on file  . Food insecurity - worry: Not on file  . Food insecurity - inability: Not on file  . Transportation needs - medical: Not on file  . Transportation needs - non-medical: Not on file  Occupational History  . 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 Smoking status: Current Every Day Smoker    Packs/day: 0.50    Years: 50.00    Pack years: 25.00    Types: Cigarettes  . Smokeless tobacco: Never Used  . Tobacco comment: currently smoking 1cig/day (05/23/16)  Substance and Sexual Activity  . Alcohol use: Yes    Alcohol/week: 0.0 oz    Comment: 1 beer/day  . Drug use: No  . Sexual activity: Not on file  Other  Topics Concern  . Not on file  Social History Narrative   Widowed 2003    Current Outpatient Medications on File Prior to Visit  Medication Sig Dispense Refill  . acetaminophen (TYLENOL) 500 MG tablet Take 250 mg by mouth every 6 (six) hours as needed for moderate pain. Reported on 04/29/2016    . allopurinol (ZYLOPRIM) 100 MG tablet TAKE 1 TABLET BY MOUTH EVERY DAY 90 tablet 3  . aspirin 81 MG tablet Take 81 mg by mouth at bedtime. Reported on 05/02/2016    . atorvastatin (LIPITOR) 80 MG tablet TAKE 1 TABLET BY MOUTH EVERY DAY 30 tablet 2  . budesonide-formoterol (SYMBICORT) 160-4.5 MCG/ACT inhaler Inhale 2 puffs into the lungs 2 (two) times daily. 1 Inhaler 6  . clopidogrel (PLAVIX) 75 MG tablet Take 1 tablet (75 mg total) by mouth daily. Patient must keep 12/22/17 appointment for further refills 30 tablet 2  . eplerenone (INSPRA) 25 MG tablet Take 1 tablet (25 mg total) by mouth daily. 30 tablet 5  . ferrous sulfate 325 (65 FE) MG tablet Take 325 mg by mouth daily with breakfast.    . losartan-hydrochlorothiazide (HYZAAR) 100-12.5 MG tablet TAKE 1 TABLET BY MOUTH DAILY. 90 tablet 2  . pantoprazole (PROTONIX) 40 MG tablet TAKE 1  TABLET BY MOUTH EVERY DAY 90 tablet 3  . potassium chloride SA (KLOR-CON M20) 20 MEQ tablet Take 1 tablet (20 mEq total) by mouth daily. 30 tablet 11  . ranitidine (ZANTAC) 150 MG tablet Take 150 mg by mouth daily.  1  . senna-docusate (SENOKOT-S) 8.6-50 MG tablet Take 1 tablet by mouth at bedtime.     Marland Kitchen umeclidinium bromide (INCRUSE ELLIPTA) 62.5 MCG/INH AEPB Inhale 1 puff into the lungs daily. 2 each 0   No current facility-administered medications on file prior to visit.     No Known Allergies  Family History  Problem Relation Age of Onset  . Cancer Sister        Uncertain type  . Cancer Brother        Uncertain type  . Arthritis Mother   . Hypertension Father   . Heart attack Father     BP 124/72 (BP Location: Left Arm, Patient Position: Sitting,  Cuff Size: Normal)   Pulse 92   Wt 197 lb (89.4 kg)   SpO2 93%   BMI 25.99 kg/m   Review of Systems Denies fever, visual loss, hearing loss, chest pain, back pain, depression, cold intolerance, BRBPR, hematuria, syncope, numbness, easy bruising, and rash. He has fatigue, rhinorrhea, and doe. Mild memory loss persists.      Objective:   Physical Exam VS: see vs page GEN: no distress HEAD: head: no deformity eyes: no periorbital swelling, no proptosis external nose and ears are normal mouth: no lesion seen NECK: supple, thyroid is not enlarged CHEST WALL: no deformity LUNGS: clear to auscultation BREASTS:  No gynecomastia CV: reg rate and rhythm, no murmur ABD: abdomen is soft, nontender.  no hepatosplenomegaly.  not distended.  no hernia.  MUSCULOSKELETAL: muscle bulk and strength are grossly normal.  no obvious joint swelling.  gait is normal and steady.   EXTEMITIES: no deformity.  no ulcer on the feet.  feet are of normal color and temp.  1+ bilat leg edema.  There is bilateral onychomycosis of the toenails.   PULSES: dorsalis pedis intact bilat.  no carotid bruit.   NEURO:  cn 2-12 grossly intact.   readily moves all 4's.  sensation is intact to touch on the feet SKIN:  Normal texture and temperature.  No rash or suspicious lesion is visible.   NODES:  None palpable at the neck.  PSYCH: alert, well-oriented.  Does not appear anxious nor depressed.      Assessment & Plan:  Wellness visit today, with problems stable, except as noted.  Patient Instructions  Please consider these measures for your health:  minimize alcohol.  Do not use tobacco products.  Have a colonoscopy at least every 10 years from age 89.  Keep firearms safely stored.  Always use seat belts.  have working smoke alarms in your home.  See an eye doctor and dentist regularly.  Never drive under the influence of alcohol or drugs (including prescription drugs).   blood tests are requested for you today.  We'll  let you know about the results. I have sent a prescription to your pharmacy, to double the donepezil. Please come back for a follow-up appointment in 6 months (on or after 06/26/18).   SEPARATE EVALUATION FOLLOWS--EACH PROBLEM HERE IS NEW, NOT RESPONDING TO TREATMENT, OR POSES SIGNIFICANT RISK TO THE PATIENT'S HEALTH: HISTORY OF THE PRESENT ILLNESS: Pt states memory loss persists PAST MEDICAL HISTORY Past Medical History:  Diagnosis Date  . CAD, NATIVE VESSEL    coronary  stent x1-tx. Plavix use.  Marland Kitchen COPD (chronic obstructive pulmonary disease) ([place])    due to 50+ years Smoking.  . Gastrointestinal bleeding, upper 04/29/2016  . Gout, unspecified    foot  . HYPERLIPIDEMIA   . HYPERTENSION   . NSCL ca dx'd 12/2015  . PEPTIC ULCER DISEASE   . TOBACCO ABUSE   . Type 2 diabetes mellitus ([place])    denies on 01-06-16    Past Surgical History:  Procedure Laterality Date  . CARDIAC CATHETERIZATION     '06-stent placed, 6'13 - Dr. Lizbeth Bark follows  . CORONARY STENT PLACEMENT     s/p  . ENDOBRONCHIAL ULTRASOUND Bilateral 01/11/2016   Procedure: ENDOBRONCHIAL ULTRASOUND;  Surgeon: Collene Gobble, MD;  Location: [facility];  Service: Endoscopy;  Laterality: Bilateral;    Social History   Socioeconomic History  . Marital status: Divorced    Spouse name: Not on file  . Number of children: 2  . Years of education: Not on file  . Highest education level: Not on file  Social Needs  . Financial resource strain: Not on file  . Food insecurity - worry: Not on file  . Food insecurity - inability: Not on file  . Transportation needs - medical: Not on file  . Transportation needs - non-medical: Not on file  Occupational History  . 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 Smoking status: Current Every Day Smoker    Packs/day: 0.50    Years: 50.00    Pack years: 25.00    Types: Cigarettes  . Smokeless tobacco: Never Used  . Tobacco comment: currently smoking 1cig/day (05/23/16)    Substance and Sexual Activity  . Alcohol use: Yes    Alcohol/week: 0.0 oz    Comment: 1 beer/day  . Drug use: No  . Sexual activity: Not on file  Other Topics Concern  . Not on file  Social History Narrative   Widowed 2003    Current Outpatient Medications on File Prior to Visit  Medication Sig Dispense Refill  . acetaminophen (TYLENOL) 500 MG tablet Take 250 mg by mouth every 6 (six) hours as needed for moderate pain. Reported on 04/29/2016    . allopurinol (ZYLOPRIM) 100 MG tablet TAKE 1 TABLET BY MOUTH EVERY DAY 90 tablet 3  . aspirin 81 MG tablet Take 81 mg by mouth at bedtime. Reported on 05/02/2016    . atorvastatin (LIPITOR) 80 MG tablet TAKE 1 TABLET BY MOUTH EVERY DAY 30 tablet 2  . budesonide-formoterol (SYMBICORT) 160-4.5 MCG/ACT inhaler Inhale 2 puffs into the lungs 2 (two) times daily. 1 Inhaler 6  . clopidogrel (PLAVIX) 75 MG tablet Take 1 tablet (75 mg total) by mouth daily. Patient must keep 12/22/17 appointment for further refills 30 tablet 2  . eplerenone (INSPRA) 25 MG tablet Take 1 tablet (25 mg total) by mouth daily. 30 tablet 5  . ferrous sulfate 325 (65 FE) MG tablet Take 325 mg by mouth daily with breakfast.    . losartan-hydrochlorothiazide (HYZAAR) 100-12.5 MG tablet TAKE 1 TABLET BY MOUTH DAILY. 90 tablet 2  . pantoprazole (PROTONIX) 40 MG tablet TAKE 1 TABLET BY MOUTH EVERY DAY 90 tablet 3  . potassium chloride SA (KLOR-CON M20) 20 MEQ tablet Take 1 tablet (20 mEq total) by mouth daily. 30 tablet 11  . ranitidine (ZANTAC) 150 MG tablet Take 150 mg by mouth daily.  1  . senna-docusate (SENOKOT-S) 8.6-50 MG tablet Take 1 tablet by mouth at bedtime.     Marland Kitchen  umeclidinium bromide (INCRUSE ELLIPTA) 62.5 MCG/INH AEPB Inhale 1 puff into the lungs daily. 2 each 0   No current facility-administered medications on file prior to visit.     No Known Allergies  Family History  Problem Relation Age of Onset  . Cancer Sister        Uncertain type  . Cancer Brother         Uncertain type  . Arthritis Mother   . Hypertension Father   . Heart attack Father     BP 124/72 (BP Location: Left Arm, Patient Position: Sitting, Cuff Size: Normal)   Pulse 92   Wt 197 lb (89.4 kg)   SpO2 93%   BMI 25.99 kg/m   REVIEW OF SYSTEMS: She has slight nausea with aricept PHYSICAL EXAMINATION: VITAL SIGNS:  See vs page GENERAL: no distress RECTAL: normal external and internal exam.  heme neg. PROSTATE:  Normal size.  No nodule LAB/XRAY RESULTS: Lab Results  Component Value Date   PSA 13.90 (H) 12/27/2017   PSA 7.81 (H) 04/13/2015   PSA 7.07 (H) 11/14/2014   IMPRESSION: psa elevation, worse Memory loss, persistent Nausea due to aricept PLAN:  Increase aricept Call if nausea worsens Ref urol

## 2017-12-27 NOTE — Telephone Encounter (Signed)
Patient notified

## 2017-12-27 NOTE — Telephone Encounter (Signed)
Patient wants his Lab results from today 12/27/17. Please call patient at ph# [phone]

## 2018-01-08 ENCOUNTER — Encounter (HOSPITAL_COMMUNITY)
Admission: RE | Admit: 2018-01-08 | Discharge: 2018-01-08 | Disposition: A | Discharge status: Home / Self Care | Payer: Medicare Other | Source: Ambulatory Visit | Attending: Internal Medicine | Admitting: Internal Medicine

## 2018-01-08 DIAGNOSIS — C3490 Malignant neoplasm of unspecified part of unspecified bronchus or lung: Secondary | ICD-10-CM | POA: Insufficient documentation

## 2018-01-08 LAB — GLUCOSE, CAPILLARY: GLUCOSE-CAPILLARY: 89 mg/dL (ref 65–99)

## 2018-01-08 MED ORDER — FLUDEOXYGLUCOSE F - 18 (FDG) INJECTION
9.8000 | Freq: Once | INTRAVENOUS | Status: AC | PRN
  Administered 2018-01-08: 9.8 via INTRAVENOUS

## 2018-01-09 ENCOUNTER — Telehealth: Payer: Self-pay

## 2018-01-09 ENCOUNTER — Inpatient Hospital Stay (HOSPITAL_BASED_OUTPATIENT_CLINIC_OR_DEPARTMENT_OTHER): Payer: Medicare Other | Admitting: Internal Medicine

## 2018-01-09 ENCOUNTER — Encounter: Payer: Self-pay | Admitting: Internal Medicine

## 2018-01-09 ENCOUNTER — Telehealth: Payer: Self-pay | Admitting: Endocrinology

## 2018-01-09 VITALS — BP 132/69 | HR 95 | Temp 97.7°F | Resp 16 | Ht 73.0 in | Wt 197.8 lb

## 2018-01-09 DIAGNOSIS — Z79899 Other long term (current) drug therapy: Secondary | ICD-10-CM

## 2018-01-09 DIAGNOSIS — Z85118 Personal history of other malignant neoplasm of bronchus and lung: Secondary | ICD-10-CM | POA: Diagnosis not present

## 2018-01-09 DIAGNOSIS — R911 Solitary pulmonary nodule: Secondary | ICD-10-CM

## 2018-01-09 DIAGNOSIS — J449 Chronic obstructive pulmonary disease, unspecified: Secondary | ICD-10-CM | POA: Diagnosis not present

## 2018-01-09 DIAGNOSIS — E785 Hyperlipidemia, unspecified: Secondary | ICD-10-CM | POA: Diagnosis not present

## 2018-01-09 DIAGNOSIS — I2510 Atherosclerotic heart disease of native coronary artery without angina pectoris: Secondary | ICD-10-CM

## 2018-01-09 DIAGNOSIS — E119 Type 2 diabetes mellitus without complications: Secondary | ICD-10-CM | POA: Diagnosis not present

## 2018-01-09 DIAGNOSIS — Z9221 Personal history of antineoplastic chemotherapy: Secondary | ICD-10-CM

## 2018-01-09 DIAGNOSIS — Z923 Personal history of irradiation: Secondary | ICD-10-CM | POA: Diagnosis not present

## 2018-01-09 DIAGNOSIS — I10 Essential (primary) hypertension: Secondary | ICD-10-CM

## 2018-01-09 DIAGNOSIS — N411 Chronic prostatitis: Secondary | ICD-10-CM

## 2018-01-09 DIAGNOSIS — C3491 Malignant neoplasm of unspecified part of right bronchus or lung: Secondary | ICD-10-CM

## 2018-01-09 DIAGNOSIS — Z7982 Long term (current) use of aspirin: Secondary | ICD-10-CM

## 2018-01-09 NOTE — Patient Instructions (Signed)
Steps to Quit Smoking Smoking tobacco can be bad for your health. It can also affect almost every organ in your body. Smoking puts you and people around you at risk for many serious long-lasting (chronic) diseases. Quitting smoking is hard, but it is one of the best things that you can do for your health. It is never too late to quit. What are the benefits of quitting smoking? When you quit smoking, you lower your risk for getting serious diseases and conditions. They can include:  Lung cancer or lung disease.  Heart disease.  Stroke.  Heart attack.  Not being able to have children (infertility).  Weak bones (osteoporosis) and broken bones (fractures).  If you have coughing, wheezing, and shortness of breath, those symptoms may get better when you quit. You may also get sick less often. If you are pregnant, quitting smoking can help to lower your chances of having a baby of low birth weight. What can I do to help me quit smoking? Talk with your doctor about what can help you quit smoking. Some things you can do (strategies) include:  Quitting smoking totally, instead of slowly cutting back how much you smoke over a period of time.  Going to in-person counseling. You are more likely to quit if you go to many counseling sessions.  Using resources and support systems, such as: ? Online chats with a counselor. ? Phone quitlines. ? Printed self-help materials. ? Support groups or group counseling. ? Text messaging programs. ? Mobile phone apps or applications.  Taking medicines. Some of these medicines may have nicotine in them. If you are pregnant or breastfeeding, do not take any medicines to quit smoking unless your doctor says it is okay. Talk with your doctor about counseling or other things that can help you.  Talk with your doctor about using more than one strategy at the same time, such as taking medicines while you are also going to in-person counseling. This can help make  quitting easier. What things can I do to make it easier to quit? Quitting smoking might feel very hard at first, but there is a lot that you can do to make it easier. Take these steps:  Talk to your family and friends. Ask them to support and encourage you.  Call phone quitlines, reach out to support groups, or work with a counselor.  Ask people who smoke to not smoke around you.  Avoid places that make you want (trigger) to smoke, such as: ? Bars. ? Parties. ? Smoke-break areas at work.  Spend time with people who do not smoke.  Lower the stress in your life. Stress can make you want to smoke. Try these things to help your stress: ? Getting regular exercise. ? Deep-breathing exercises. ? Yoga. ? Meditating. ? Doing a body scan. To do this, close your eyes, focus on one area of your body at a time from head to toe, and notice which parts of your body are tense. Try to relax the muscles in those areas.  Download or buy apps on your mobile phone or tablet that can help you stick to your quit plan. There are many free apps, such as QuitGuide from the CDC (Centers for Disease Control and Prevention). You can find more support from smokefree.gov and other websites.  This information is not intended to replace advice given to you by your health care provider. Make sure you discuss any questions you have with your health care provider. Document Released: 09/24/2009 Document   Revised: 07/26/2016 Document Reviewed: 04/14/2015 Elsevier Interactive Patient Education  2018 Elsevier Inc.  

## 2018-01-09 NOTE — Telephone Encounter (Signed)
Gave patient avs and calender for upcoming appointment. Called to [facility], they will call patient with appointment. Faxed [facility] refeeral. Per 1/29 los

## 2018-01-09 NOTE — Telephone Encounter (Signed)
I called & told patient that it appears that a Dr. Inda Merlin put in a referral today for patient to see a urologist. I asked that he give the office he was referred to a few days to call him for appt.

## 2018-01-09 NOTE — Progress Notes (Signed)
[facility] [phone]   [phone]  OFFICE PROGRESS NOTE  Renato Shin, MD [address] [place] [address]  DIAGNOSIS: Unresectable Stage IIA (T1b, N1, M0) non-small cell lung cancer, squamous cell carcinoma presented with right upper lobe lung nodule and hilar lymphadenopathy diagnosed in January 2017.  PRIOR THERAPY: Course of concurrent chemoradiation with weekly carboplatin for AUC of 2 and paclitaxel 45 MG/M2. Status post 7 cycles, last dose was given 03/14/2016 with partial response.  CURRENT THERAPY: Observation.  INTERVAL HISTORY: Mylez Venable Schnoebelen 77 y.o. male returns to the clinic today for follow-up visit accompanied by his wife.  The patient is feeling fine today with no specific complaints.  He denied having any chest pain, shortness breath, cough or hemoptysis.  He denied having any fever or chills.  He has no nausea, vomiting, diarrhea or constipation.  Previous CT scan of the chest showed concerning findings for disease recurrence in the right lung.  I ordered a PET scan that was performed recently and the patient is here for evaluation and discussion of his scan results and recommendation regarding treatment of his condition.  MEDICAL HISTORY: Past Medical History:  Diagnosis Date  . CAD, NATIVE VESSEL    coronary stent x1-tx. Plavix use.  Marland Kitchen COPD (chronic obstructive pulmonary disease) ([place])    due to 50+ years Smoking.  . Gastrointestinal bleeding, upper 04/29/2016  . Gout, unspecified    foot  . HYPERLIPIDEMIA   . HYPERTENSION   . NSCL ca dx'd 12/2015  . PEPTIC ULCER DISEASE   . TOBACCO ABUSE   . Type 2 diabetes mellitus ([place])    denies on 01-06-16    ALLERGIES:  has No Known Allergies.  MEDICATIONS:  Current Outpatient Medications  Medication Sig Dispense Refill  . acetaminophen (TYLENOL) 500 MG tablet Take 250 mg by mouth every 6 (six) hours as needed for moderate pain. Reported on 04/29/2016    .  allopurinol (ZYLOPRIM) 100 MG tablet TAKE 1 TABLET BY MOUTH EVERY DAY 90 tablet 3  . aspirin 81 MG tablet Take 81 mg by mouth at bedtime. Reported on 05/02/2016    . atorvastatin (LIPITOR) 80 MG tablet TAKE 1 TABLET BY MOUTH EVERY DAY 30 tablet 2  . budesonide-formoterol (SYMBICORT) 160-4.5 MCG/ACT inhaler Inhale 2 puffs into the lungs 2 (two) times daily. 1 Inhaler 6  . clopidogrel (PLAVIX) 75 MG tablet Take 1 tablet (75 mg total) by mouth daily. Patient must keep 12/22/17 appointment for further refills 30 tablet 2  . donepezil (ARICEPT) 10 MG tablet Take 1 tablet (10 mg total) by mouth at bedtime. 30 tablet 11  . eplerenone (INSPRA) 25 MG tablet Take 1 tablet (25 mg total) by mouth daily. 30 tablet 5  . ferrous sulfate 325 (65 FE) MG tablet Take 325 mg by mouth daily with breakfast.    . losartan-hydrochlorothiazide (HYZAAR) 100-12.5 MG tablet TAKE 1 TABLET BY MOUTH DAILY. 90 tablet 2  . pantoprazole (PROTONIX) 40 MG tablet TAKE 1 TABLET BY MOUTH EVERY DAY 90 tablet 3  . potassium chloride SA (KLOR-CON M20) 20 MEQ tablet Take 1 tablet (20 mEq total) by mouth daily. 30 tablet 11  . ranitidine (ZANTAC) 150 MG tablet Take 150 mg by mouth daily.  1  . senna-docusate (SENOKOT-S) 8.6-50 MG tablet Take 1 tablet by mouth at bedtime.     Marland Kitchen umeclidinium bromide (INCRUSE ELLIPTA) 62.5 MCG/INH AEPB Inhale 1 puff into the lungs daily. 2  each 0   No current facility-administered medications for this visit.     SURGICAL HISTORY:  Past Surgical History:  Procedure Laterality Date  . CARDIAC CATHETERIZATION     '06-stent placed, 6'13 - Dr. Lizbeth Bark follows  . CORONARY STENT PLACEMENT     s/p  . ENDOBRONCHIAL ULTRASOUND Bilateral 01/11/2016   Procedure: ENDOBRONCHIAL ULTRASOUND;  Surgeon: Collene Gobble, MD;  Location: [facility];  Service: Endoscopy;  Laterality: Bilateral;    REVIEW OF SYSTEMS:  Constitutional: negative Eyes: negative Ears, nose, mouth, throat, and face: negative Respiratory:  positive for dyspnea on exertion Cardiovascular: negative Gastrointestinal: negative Genitourinary:negative Integument/breast: negative Hematologic/lymphatic: negative Musculoskeletal:negative Neurological: negative Behavioral/Psych: negative Endocrine: negative Allergic/Immunologic: negative   PHYSICAL EXAMINATION: General appearance: alert, cooperative and no distress Head: Normocephalic, without obvious abnormality, atraumatic Neck: no adenopathy, no JVD, supple, symmetrical, trachea midline and thyroid not enlarged, symmetric, no tenderness/mass/nodules Lymph nodes: Cervical, supraclavicular, and axillary nodes normal. Resp: clear to auscultation bilaterally Back: symmetric, no curvature. ROM normal. No CVA tenderness. Cardio: regular rate and rhythm, S1, S2 normal, no murmur, click, rub or gallop GI: soft, non-tender; bowel sounds normal; no masses,  no organomegaly Extremities: extremities normal, atraumatic, no cyanosis or edema Neurologic: Alert and oriented X 3, normal strength and tone. Normal symmetric reflexes. Normal coordination and gait  ECOG PERFORMANCE STATUS: 1 - Symptomatic but completely ambulatory  Blood pressure 132/69, pulse 95, temperature 97.7 F (36.5 C), temperature source Oral, resp. rate 16, height 6\' 1"  (1.854 m), weight 197 lb 12.8 oz (89.7 kg), SpO2 95 %.  LABORATORY DATA: Lab Results  Component Value Date   WBC 8.0 12/18/2017   HGB 14.1 12/18/2017   HCT 43.5 12/18/2017   MCV 98.6 (H) 12/18/2017   PLT 263 12/18/2017      Chemistry      Component Value Date/Time   NA 139 12/18/2017 0811   NA 141 06/16/2017 0906   K 4.4 12/18/2017 0811   K 3.4 (L) 06/16/2017 0906   CL 99 12/18/2017 0811   CO2 32 (H) 12/18/2017 0811   CO2 35 (H) 06/16/2017 0906   BUN 13 12/18/2017 0811   BUN 10.3 06/16/2017 0906   CREATININE 0.92 12/18/2017 0811   CREATININE 0.8 06/16/2017 0906      Component Value Date/Time   CALCIUM 9.2 12/18/2017 0811   CALCIUM  9.3 06/16/2017 0906   ALKPHOS 102 12/18/2017 0811   ALKPHOS 87 06/16/2017 0906   AST 22 12/18/2017 0811   AST 21 06/16/2017 0906   ALT 19 12/18/2017 0811   ALT 15 06/16/2017 0906   BILITOT 0.5 12/18/2017 0811   BILITOT 0.46 06/16/2017 0906       RADIOGRAPHIC STUDIES: Ct Chest W Contrast  Result Date: 12/18/2017 CLINICAL DATA:  77 year old male with history of non-small cell lung cancer diagnosed in January 2017 status post chemotherapy (completed in April 2018). Followup study. EXAM: CT CHEST WITH CONTRAST TECHNIQUE: Multidetector CT imaging of the chest was performed during intravenous contrast administration. CONTRAST:  8mL ISOVUE-300 IOPAMIDOL (ISOVUE-300) INJECTION 61% COMPARISON:  Chest CT 06/19/2017. FINDINGS: Cardiovascular: Heart size is normal. There is no significant pericardial fluid, thickening or pericardial calcification. There is aortic atherosclerosis, as well as atherosclerosis of the great vessels of the mediastinum and the coronary arteries, including calcified atherosclerotic plaque in the left main, left anterior descending, left circumflex and right coronary arteries. Mediastinum/Nodes: No pathologically enlarged mediastinal or hilar lymph nodes. Esophagus is unremarkable in appearance. No axillary lymphadenopathy. Lungs/Pleura: Compared to the  prior examination the previously noted nodule in the central aspect of the right upper lobe has significantly increased in size, currently measuring 2.9 x 1.7 x 1.8 cm (axial image 69 of series 7 and coronal image 53 of series 5), demonstrating macrolobulated and spiculated margins. Extending it outward from this lesion into the more distal lung parenchyma there are band-like areas of septal thickening, likely to reflect some developing mild postobstructive changes. In the medial aspect of the superior segment of the left lower lobe (axial image 80 of series 7) there is a mixed solid and subsolid lesion which is predominantly ground-glass  attenuation and septal thickening measuring 17 x 23 mm, but has an internal solid component measuring up to 9 mm (axial image 82 of series 2), and has clearly enlarged compared to prior examinations, highly concerning for slow-growing primary bronchogenic adenocarcinoma. No acute consolidative airspace disease. No pleural effusions. Diffuse bronchial wall thickening with mild to moderate centrilobular and paraseptal emphysema. Upper Abdomen: Intermediate attenuation lesion associated with the liver capsule adjacent to segment 7 of the liver is similar to prior studies, likely a mildly proteinaceous cyst, measuring 3.0 x 5.0 cm. Musculoskeletal: There are no aggressive appearing lytic or blastic lesions noted in the visualized portions of the skeleton. IMPRESSION: 1. Findings are highly concerning for local recurrence of disease in the central aspect of the right upper lobe, within an enlarging 2.9 x 1.7 cm nodule. No mediastinal or hilar lymphadenopathy noted at this time. 2. In addition, there is an enlarging mixed solid and subsolid lesion in the medial aspect of the superior segment of the left lower lobe which is highly concerning for a slowly growing primary bronchogenic adenocarcinoma. 3. Aortic atherosclerosis, in addition to left main and 3 vessel coronary artery disease. Assessment for potential risk factor modification, dietary therapy or pharmacologic therapy may be warranted, if clinically indicated. 4. Mild diffuse bronchial wall thickening with mild to moderate centrilobular and paraseptal emphysema. 5. Additional incidental findings, similar prior studies, as above. These results will be called to the ordering clinician or representative by the Radiologist Assistant, and communication documented in the PACS or zVision Dashboard. Aortic Atherosclerosis (ICD10-I70.0) and Emphysema (ICD10-J43.9). Electronically Signed   By: Vinnie Langton M.D.   On: 12/18/2017 10:56   Nm Pet Image Restag (ps) Skull  Base To Thigh  Result Date: 01/08/2018 CLINICAL DATA:  Subsequent treatment strategy for lung carcinoma. EXAM: NUCLEAR MEDICINE PET SKULL BASE TO THIGH TECHNIQUE: 9.8 mCi F-18 FDG was injected intravenously. Full-ring PET imaging was performed from the skull base to thigh after the radiotracer. CT data was obtained and used for attenuation correction and anatomic localization. FASTING BLOOD GLUCOSE:  Value: 89 mg/dl COMPARISON:  CT 12/18/2017, PET-CT 12/11/2015 FINDINGS: NECK No hypermetabolic lymph nodes in the neck. CHEST Central nodule in the RIGHT upper lobe at site of prior lung carcinoma is increased in size over comparison CT exams and is now hypermetabolic. Lesion measures 18 mm x 31 mm (image 33, series 8) with intense metabolic activity (SUV max equal 14.3). This is similar to original staging exam with SUV max equal 16 at that time. No hypermetabolic mediastinal lymph nodes. New mixed solid and ground-glass nodule in the superior segment of the LEFT lower lobe measures 18 mm x 15 mm and has low metabolic activity (SUV max equal 2.7. ABDOMEN/PELVIS No abnormal hypermetabolic activity within the liver, pancreas, adrenal glands, or spleen. No hypermetabolic lymph nodes in the abdomen or pelvis. Persistent hypermetabolic activity in the peripheral zone  of the prostate. SKELETON No focal hypermetabolic activity to suggest skeletal metastasis. IMPRESSION: 1. Findings consistent with lung carcinoma recurrence within the central RIGHT upper lobe. 2. No evidence of mediastinal nodal metastasis or distant metastatic disease. 3. New ground-glass nodule in the LEFT lower lobe with low but measurable metabolic activity is concerning for low-grade adenocarcinoma. 4. Persistent hypermetabolic activity within the peripheral zone of the prostate. Recommend correlation with PSA and consider prostate MRI or biopsy. Electronically Signed   By: Suzy Bouchard M.D.   On: 01/08/2018 11:33    ASSESSMENT AND PLAN:  This  is a very pleasant 77 years old African-American male with unresectable a stage IIA non-small cell lung cancer, squamous cell carcinoma status post a course of concurrent chemoradiation.  The patient has been in observation since April 2017. Repeat CT scan of the chest showed enlargement of right upper lobe lung nodule suspicious for disease recurrence. A PET scan performed recently showed findings consistent with lung cancer recurrence with in the central right upper lobe as well as new ground glass nodule in the left lower lobe with low but measurable metabolic activity concerning for low-grade adenocarcinoma. I personally and independently reviewed the scan images and discussed the results with the patient. I recommended for him to see Dr. Lamonte Sakai for reevaluation and consideration of repeat bronchoscopy and biopsy of these areas. Regarding the metabolic activity in the prostate, his PSA has been rising and I will arrange for the patient to see urology for evaluation and to rule out prostate adenocarcinoma. I will see the patient back for follow-up visit in 3 weeks for evaluation discussion of his treatment options based on the biopsy. He was advised to call immediately if he has any concerning symptoms in the interval. The patient voices understanding of current disease status and treatment options and is in agreement with the current care plan. All questions were answered. The patient knows to call the clinic with any problems, questions or concerns. We can certainly see the patient much sooner if necessary.  Disclaimer: This note was dictated with voice recognition software. Similar sounding words can inadvertently be transcribed and may be missed upon review.

## 2018-01-09 NOTE — Telephone Encounter (Signed)
Patient stated that no one called him to make and appointment for urology. Please advise

## 2018-01-15 ENCOUNTER — Telehealth: Payer: Self-pay | Admitting: Medical Oncology

## 2018-01-15 NOTE — Telephone Encounter (Signed)
Pt has not heard from urology. I called [facility] and requested someone call pt. Appt is for Feb 20 at 215. Staff will call pt.

## 2018-01-16 ENCOUNTER — Encounter: Payer: Self-pay | Admitting: Internal Medicine

## 2018-01-16 ENCOUNTER — Ambulatory Visit (INDEPENDENT_AMBULATORY_CARE_PROVIDER_SITE_OTHER): Payer: Medicare Other | Admitting: Internal Medicine

## 2018-01-16 VITALS — BP 120/68 | HR 94 | Ht 73.0 in | Wt 199.2 lb

## 2018-01-16 DIAGNOSIS — J449 Chronic obstructive pulmonary disease, unspecified: Secondary | ICD-10-CM

## 2018-01-16 DIAGNOSIS — C3491 Malignant neoplasm of unspecified part of right bronchus or lung: Secondary | ICD-10-CM | POA: Diagnosis not present

## 2018-01-16 DIAGNOSIS — F17210 Nicotine dependence, cigarettes, uncomplicated: Secondary | ICD-10-CM

## 2018-01-16 MED ORDER — BUDESONIDE-FORMOTEROL FUMARATE 160-4.5 MCG/ACT IN AERO: 2.0000 | INHALATION_SPRAY | Freq: Two times a day (BID) | RESPIRATORY_TRACT | 0 refills | Status: DC

## 2018-01-16 NOTE — Patient Instructions (Addendum)
Come to outpatient registration at [facility] (behind the ER) at 27 am Tuesday 01/30/18 with nothing to eat or drink after midnight Monday for Bronchoscopy   The key is to stop smoking completely before smoking completely stops you!

## 2018-01-16 NOTE — Progress Notes (Signed)
Subjective:     Patient ID: Ryan Contreras, male   DOB: 03-Nov-1941,  MRN: [id number]  HPI  70 yobm active smoker under the care of Dr Julien Nordmann for lung ca dx Jan 2017 by [facility] with last note:   12/18/17 [facility] DIAGNOSIS: Unresectable Stage IIA (T1b, N1, M0) non-small cell lung cancer, squamous cell carcinoma presented with right upper lobe lung nodule and hilar lymphadenopathy diagnosed in January 2017.  PRIOR THERAPY: Course of concurrent chemoradiation with weekly carboplatin for AUC of 2 and paclitaxel 45 MG/M2. Status post 7 cycles, last dose was given 03/14/2016 with partial response.  CURRENT THERAPY: Observation.  rec PET done  01/08/18  1. Findings consistent with lung carcinoma recurrence within the central RIGHT upper lobe. 2. No evidence of mediastinal nodal metastasis or distant metastatic disease. 3. New ground-glass nodule in the LEFT lower lobe with low but measurable metabolic activity is concerning for low-grade adenocarcinoma.    01/16/2018 1st   visit/ [facility]   Re ? Recurrent lung ca/ copd  Chief Complaint  Patient presents with  . Pulmonary Consult    Referred by Dr. Earlie Server for eval of possible lung ca. Pt has a hx of lung ca. He states his breathing is about the same since he was seen here by Dr Lenna Gilford Dec 2018.    doe = MMRC2 = can't walk a nl pace on a flat grade s sob but does fine slow and flat shop at [employer] ok/ walks across parking lot / no better with symbicort  Cough = min mucoid in am s blood  Sleeps ok    No obvious day to day or daytime variability or assoc excess/ purulent sputum or mucus plugs or hemoptysis or cp or chest tightness, subjective wheeze or overt sinus or hb symptoms. No unusual exposure hx or h/o childhood pna/ asthma or knowledge of premature birth.  Sleeping ok flat without nocturnal  or early am exacerbation  of respiratory  c/o's or need for noct saba. Also denies any obvious fluctuation of symptoms with weather or environmental  changes or other aggravating or alleviating factors except as outlined above   Current Allergies, Complete Past Medical History, Past Surgical History, Family History, and Social History were reviewed in [employer] record.  ROS  The following are not active complaints unless bolded Hoarseness, sore throat, dysphagia, dental problems, itching, sneezing,  nasal congestion or discharge of excess mucus or purulent secretions, ear ache,   fever, chills, sweats, unintended wt loss or wt gain, classically pleuritic or exertional cp,  orthopnea pnd or leg swelling, presyncope, palpitations, abdominal pain, anorexia, nausea, vomiting, diarrhea  or change in bowel habits or change in bladder habits, change in stools or change in urine, dysuria, hematuria,  rash, arthralgias, visual complaints, headache, numbness, weakness or ataxia or problems with walking or coordination,  change in mood/affect or memory.        Current Meds  Medication Sig  . acetaminophen (TYLENOL) 500 MG tablet Take 250 mg by mouth every 6 (six) hours as needed for moderate pain. Reported on 04/29/2016  . allopurinol (ZYLOPRIM) 100 MG tablet TAKE 1 TABLET BY MOUTH EVERY DAY  . aspirin 81 MG tablet Take 81 mg by mouth at bedtime. Reported on 05/02/2016  . atorvastatin (LIPITOR) 80 MG tablet TAKE 1 TABLET BY MOUTH EVERY DAY  . budesonide-formoterol (SYMBICORT) 160-4.5 MCG/ACT inhaler Inhale 2 puffs into the lungs 2 (two) times daily.  . clopidogrel (PLAVIX) 75 MG tablet Take  1 tablet (75 mg total) by mouth daily. Patient must keep 12/22/17 appointment for further refills  . donepezil (ARICEPT) 10 MG tablet Take 1 tablet (10 mg total) by mouth at bedtime.  Marland Kitchen eplerenone (INSPRA) 25 MG tablet Take 1 tablet (25 mg total) by mouth daily.  . ferrous sulfate 325 (65 FE) MG tablet Take 325 mg by mouth daily with breakfast.  . losartan-hydrochlorothiazide (HYZAAR) 100-12.5 MG tablet TAKE 1 TABLET BY MOUTH DAILY.  .  pantoprazole (PROTONIX) 40 MG tablet TAKE 1 TABLET BY MOUTH EVERY DAY  . potassium chloride SA (KLOR-CON M20) 20 MEQ tablet Take 1 tablet (20 mEq total) by mouth daily.  . ranitidine (ZANTAC) 150 MG tablet Take 150 mg by mouth daily.  Marland Kitchen senna-docusate (SENOKOT-S) 8.6-50 MG tablet Take 1 tablet by mouth at bedtime.   . [DISCONTINUED] budesonide-formoterol (SYMBICORT) 160-4.5 MCG/ACT inhaler Inhale 2 puffs into the lungs 2 (two) times daily.         Review of Systems     Objective:   Physical Exam  Chronically ill appearing elderly bm, very slow responses   Wt Readings from Last 3 Encounters:  01/16/18 199 lb 3.2 oz (90.4 kg)  01/09/18 197 lb 12.8 oz (89.7 kg)  12/27/17 197 lb (89.4 kg)     Vital signs reviewed - Note on arrival 02 sats  94% on RA      HEENT: nl  turbinates bilaterally, and oropharynx. Nl external ear canals without cough reflex -Extremely poor dentition / partial upper plate   NECK :  without JVD/Nodes/TM/ nl carotid upstrokes bilaterally   LUNGS: no acc muscle use,  barrel contour chest  With very distant bs/ pan exp wheeze with hyper resonance to percussion    CV:  RRR  no s3 or murmur or increase in P2, and no edema   ABD:  soft and nontender with pos hoover's mid inpsiration   in the supine position. No bruits or organomegaly appreciated, bowel sounds nl  MS:  Nl gait/ ext warm without deformities, calf tenderness, cyanosis or clubbing No obvious joint restrictions   SKIN: warm and dry without lesions    NEURO:  alert, approp, nl sensorium with  no motor or cerebellar deficits apparent.     I personally reviewed images and agree with radiology impression as follows:   Chest CT with contrast 12/18/17  1. Findings are highly concerning for local recurrence of disease in the central aspect of the right upper lobe, within an enlarging 2.9 x 1.7 cm nodule. No mediastinal or hilar lymphadenopathy noted at this time. 2. In addition, there is an  enlarging mixed solid and subsolid lesion in the medial aspect of the superior segment of the left lower lobe which is highly concerning for a slowly growing primary bronchogenic adenocarcinoma.    Assessment:

## 2018-01-17 ENCOUNTER — Encounter: Payer: Self-pay | Admitting: Internal Medicine

## 2018-01-17 ENCOUNTER — Encounter: Payer: Self-pay | Admitting: *Deleted

## 2018-01-17 ENCOUNTER — Telehealth: Payer: Self-pay | Admitting: Internal Medicine

## 2018-01-17 NOTE — Telephone Encounter (Signed)
Rescheduled appts per 2/6 sch msg - attempted to leave voicemail for patient regarding appts. Sending confirmation letter in the mail.

## 2018-01-17 NOTE — Assessment & Plan Note (Addendum)
PFT's  12/11/15  FEV1 1.13 (35 % ) ratio 37  p no % improvement from saba p ? prior to study with DLCO  31 % corrects to 49 % for alv volume  And classic curvature - Spirometry 01/16/2018  FEV1 1.06 (33%)  Ratio 47 with classic curvature    - The proper method of use, as well as anticipated side effects, of a metered-dose inhaler are discussed and demonstrated to the patient. Improved effectiveness after extensive coaching during this visit to a level of approximately 50 % from a baseline of nearly 0 % > will need to continue to work on adequate hfa or change to dpi or neb   Key  Here is obviously stop inhaling cigs and start inhaling meds more effectively

## 2018-01-17 NOTE — Assessment & Plan Note (Signed)

## 2018-01-17 NOTE — Assessment & Plan Note (Addendum)
Discussed in detail all the  indications, usual  risks and alternatives  relative to the benefits with patient who agrees to proceed with bronchoscopy with biopsy in 2 weeks of the RUL lesion which is the more worrisome of the 2 areas  The Sup segment could be slow growing tumor but given the severity of copd would think it's the lesser of the two evils and can't bx both sides at once safely.  > 50% of this office consultation  spent in counseling

## 2018-01-17 NOTE — Progress Notes (Signed)
Oncology Nurse Navigator Documentation  Oncology Nurse Navigator Flowsheets 01/17/2018  Navigator Location [facility]  Navigator Encounter Type Other/I followed up on Ryan Contreras's appt. He is having bronchoscopy the same day he is to have a follow up with Dr. Julien Nordmann. I updated scheduling to re-schedule with Ryan Contreras on 02/01/18 at 9:00 no labs needed.    Treatment Phase Abnormal Scans  Barriers/Navigation Needs Coordination of Care  Interventions Coordination of Care  Coordination of Care Other  Acuity Level 2  Time Spent with Patient 30

## 2018-01-29 ENCOUNTER — Encounter: Payer: Self-pay | Admitting: *Deleted

## 2018-01-29 NOTE — Progress Notes (Signed)
Oncology Nurse Navigator Documentation  Oncology Nurse Navigator Flowsheets 01/29/2018  Navigator Location [facility]  Navigator Encounter Type Other/I followed up on Mr. Myhand schedule.  He is to have bronchoscopy by pulmonary.  It looks like one was set up for tomorrow but is now cancelled. I contacted pulmonary to double check on bronchoscopy.   Treatment Phase Abnormal Scans  Barriers/Navigation Needs Coordination of Care  Interventions Coordination of Care  Coordination of Care Other  Acuity Level 2  Time Spent with Patient 30

## 2018-01-29 NOTE — H&P (Signed)
HPI  68 yobm active smoker under the care of Dr Julien Nordmann for lung ca dx Jan 2017 by [facility] with last note:   12/18/17 [facility] DIAGNOSIS:Unresectable Stage IIA (T1b, N1, M0) non-small cell lung cancer, squamous cell carcinoma presented with right upper lobe lung nodule and hilar lymphadenopathy diagnosed in January 2017.  PRIOR THERAPY: Course of concurrent chemoradiation with weekly carboplatin for AUC of 2 and paclitaxel 45 MG/M2. Status post 7 cycles, last dose was given 03/14/2016 with partial response.  CURRENT THERAPY: Observation.  rec PET done  01/08/18  1. Findings consistent with lung carcinoma recurrence within the central RIGHT upper lobe. 2. No evidence of mediastinal nodal metastasis or distant metastatic disease. 3. New ground-glass nodule in the LEFT lower lobe with low but measurable metabolic activity is concerning for low-grade adenocarcinoma.    01/16/2018 1st   visit/ Danielle Mink   Re ? Recurrent lung ca/ copd      Chief Complaint  Patient presents with  . Pulmonary Consult    Referred by Dr. Earlie Server for eval of possible lung ca. Pt has a hx of lung ca. He states his breathing is about the same since he was seen here by Dr Lenna Gilford Dec 2018.    doe = MMRC2 = can't walk a nl pace on a flat grade s sob but does fine slow and flat shop at [employer] ok/ walks across parking lot / no better with symbicort  Cough = min mucoid in am s blood  Sleeps ok    No obvious day to day or daytime variability or assoc excess/ purulent sputum or mucus plugs or hemoptysis or cp or chest tightness, subjective wheeze or overt sinus or hb symptoms. No unusual exposure hx or h/o childhood pna/ asthma or knowledge of premature birth.  Sleeping ok flat without nocturnal  or early am exacerbation  of respiratory  c/o's or need for noct saba. Also denies any obvious fluctuation of symptoms with weather or environmental changes or other aggravating or alleviating factors except as outlined  above   Current Allergies, Complete Past Medical History, Past Surgical History, Family History, and Social History were reviewed in [employer] record.  ROS  The following are not active complaints unless bolded Hoarseness, sore throat, dysphagia, dental problems, itching, sneezing,  nasal congestion or discharge of excess mucus or purulent secretions, ear ache,   fever, chills, sweats, unintended wt loss or wt gain, classically pleuritic or exertional cp,  orthopnea pnd or leg swelling, presyncope, palpitations, abdominal pain, anorexia, nausea, vomiting, diarrhea  or change in bowel habits or change in bladder habits, change in stools or change in urine, dysuria, hematuria,  rash, arthralgias, visual complaints, headache, numbness, weakness or ataxia or problems with walking or coordination,  change in mood/affect or memory.            Current Meds  Medication Sig  . acetaminophen (TYLENOL) 500 MG tablet Take 250 mg by mouth every 6 (six) hours as needed for moderate pain. Reported on 04/29/2016  . allopurinol (ZYLOPRIM) 100 MG tablet TAKE 1 TABLET BY MOUTH EVERY DAY  . aspirin 81 MG tablet Take 81 mg by mouth at bedtime. Reported on 05/02/2016  . atorvastatin (LIPITOR) 80 MG tablet TAKE 1 TABLET BY MOUTH EVERY DAY  . budesonide-formoterol (SYMBICORT) 160-4.5 MCG/ACT inhaler Inhale 2 puffs into the lungs 2 (two) times daily.  . clopidogrel (PLAVIX) 75 MG tablet Take 1 tablet (75 mg total) by mouth daily. Patient must keep 12/22/17  appointment for further refills  . donepezil (ARICEPT) 10 MG tablet Take 1 tablet (10 mg total) by mouth at bedtime.  Marland Kitchen eplerenone (INSPRA) 25 MG tablet Take 1 tablet (25 mg total) by mouth daily.  . ferrous sulfate 325 (65 FE) MG tablet Take 325 mg by mouth daily with breakfast.  . losartan-hydrochlorothiazide (HYZAAR) 100-12.5 MG tablet TAKE 1 TABLET BY MOUTH DAILY.  . pantoprazole (PROTONIX) 40 MG tablet TAKE 1 TABLET BY MOUTH EVERY DAY  .  potassium chloride SA (KLOR-CON M20) 20 MEQ tablet Take 1 tablet (20 mEq total) by mouth daily.  . ranitidine (ZANTAC) 150 MG tablet Take 150 mg by mouth daily.  Marland Kitchen senna-docusate (SENOKOT-S) 8.6-50 MG tablet Take 1 tablet by mouth at bedtime.   . [DISCONTINUED] budesonide-formoterol (SYMBICORT) 160-4.5 MCG/ACT inhaler Inhale 2 puffs into the lungs 2 (two) times daily.         Review of Systems     Objective:   Physical Exam  Chronically ill appearing elderly bm, very slow responses      Wt Readings from Last 3 Encounters:  01/16/18 199 lb 3.2 oz (90.4 kg)  01/09/18 197 lb 12.8 oz (89.7 kg)  12/27/17 197 lb (89.4 kg)     Vital signs reviewed - Note on arrival 02 sats  94% on RA      HEENT: nl  turbinates bilaterally, and oropharynx. Nl external ear canals without cough reflex -Extremely poor dentition / partial upper plate   NECK :  without JVD/Nodes/TM/ nl carotid upstrokes bilaterally   LUNGS: no acc muscle use,  barrel contour chest  With very distant bs/ pan exp wheeze with hyper resonance to percussion    CV:  RRR  no s3 or murmur or increase in P2, and no edema   ABD:  soft and nontender with pos hoover's mid inpsiration   in the supine position. No bruits or organomegaly appreciated, bowel sounds nl  MS:  Nl gait/ ext warm without deformities, calf tenderness, cyanosis or clubbing No obvious joint restrictions   SKIN: warm and dry without lesions    NEURO:  alert, approp, nl sensorium with  no motor or cerebellar deficits apparent.     I personally reviewed images and agree with radiology impression as follows:   Chest CT with contrast 12/18/17  1. Findings are highly concerning for local recurrence of disease in the central aspect of the right upper lobe, within an enlarging 2.9 x 1.7 cm nodule. No mediastinal or hilar lymphadenopathy noted at this time. 2. In addition, there is an enlarging mixed solid and subsolid lesion in the  medial aspect of the superior segment of the left lower lobe which is highly concerning for a slowly growing primary bronchogenic adenocarcinoma.    Assessment:              Assessment & Plan Note by Tanda Rockers, MD at 01/17/2018 10:22 AM   Author: Tanda Rockers, MD Author Type: Physician Filed: 01/17/2018 10:24 AM  Note Status: Bernell List: Cosign Not Required Encounter Date: 01/16/2018  Problem: Non-small cell carcinoma of lung, stage 2 ([place])  Editor: Tanda Rockers, MD (Physician)  Prior Versions: 1. Tanda Rockers, MD (Physician) at 01/17/2018 10:23 AM - Written    Discussed in detail all the  indications, usual  risks and alternatives  relative to the benefits with patient who agrees to proceed with bronchoscopy with biopsy in 2 weeks of the RUL lesion which is the more worrisome of  the 2 areas  The Sup segment could be slow growing tumor but given the severity of copd would think it's the lesser of the two evils and can't bx both sides at once safely.  > 50% of this office consultation  spent in counseling       Assessment & Plan Note by Tanda Rockers, MD at 01/17/2018 10:22 AM   Author: Tanda Rockers, MD Author Type: Physician Filed: 01/17/2018 10:22 AM  Note Status: Written Cosign: Cosign Not Required Encounter Date: 01/16/2018  Problem: Cigarette smoker  Editor: Tanda Rockers, MD (Physician)    > 3 min discussion  I emphasized that although we never turn away smokers from the pulmonary clinic, we do ask that they understand that the recommendations that we make  won't work nearly as well in the presence of continued cigarette exposure.  In fact, we may very well  reach a point where we can't promise to help the patient if he/she can't quit smoking. (We can and will promise to try to help, we just can't promise what we recommend will really work)     Assessment & Plan Note by Tanda Rockers, MD at 01/17/2018 10:20 AM   Author: Tanda Rockers, MD Author  Type: Physician Filed: 01/17/2018 10:21 AM  Note Status: Bernell List: Cosign Not Required Encounter Date: 01/16/2018  Problem: COPD GOLD III still smoking   Editor: Tanda Rockers, MD (Physician)  Prior Versions: 1. Tanda Rockers, MD (Physician) at 01/17/2018 10:21 AM - Written    PFT's  12/11/15  FEV1 1.13 (35 % ) ratio 37  p no % improvement from saba p ? prior to study with DLCO  31 % corrects to 49 % for alv volume  And classic curvature - Spirometry 01/16/2018  FEV1 1.06 (33%)  Ratio 47 with classic curvature    - The proper method of use, as well as anticipated side effects, of a metered-dose inhaler are discussed and demonstrated to the patient. Improved effectiveness after extensive coaching during this visit to a level of approximately 50 % from a baseline of nearly 0 % > will need to continue to work on adequate hfa or change to dpi or neb   Key  Here is obviously stop inhaling cigs and start inhaling meds more effectively        Patient Instructions by Tanda Rockers, MD at 01/16/2018 9:00 AM   Author: Tanda Rockers, MD Author Type: Physician Filed: 01/16/2018 9:51 AM  Note Status: Addendum Cosign: Cosign Not Required Encounter Date: 01/16/2018  Editor: Tanda Rockers, MD (Physician)  Prior Versions: 1. Tanda Rockers, MD (Physician) at 01/16/2018 9:50 AM - Signed    Come to outpatient registration at [facility] (behind the ER) at 9 am Tuesday 01/30/18 with nothing to eat or drink after midnight Monday for Bronchoscopy   The key is to stop smoking completely before smoking completely stops you!     Instructions   Come to outpatient registration at [facility] (behind the ER) at 34 am Tuesday 01/30/18 with nothing to eat or drink after midnight Monday for Bronchoscopy   The key is to stop smoking completely before smoking completely stops you!        01/29/2018 day of FOB/ no change in hx or exam     Christinia Gully, MD Pulmonary and [place] [phone] After 5:30 PM or weekends, use Beeper [phone]

## 2018-01-30 ENCOUNTER — Ambulatory Visit: Payer: Medicare Other | Admitting: Internal Medicine

## 2018-01-30 ENCOUNTER — Encounter (HOSPITAL_COMMUNITY): Payer: Medicare Other

## 2018-01-30 ENCOUNTER — Encounter (HOSPITAL_COMMUNITY): Admission: RE | Payer: Self-pay | Source: Ambulatory Visit

## 2018-01-30 ENCOUNTER — Ambulatory Visit (HOSPITAL_COMMUNITY): Admission: RE | Admit: 2018-01-30 | Payer: Medicare Other | Source: Ambulatory Visit | Admitting: Internal Medicine

## 2018-01-30 SURGERY — BRONCHOSCOPY, WITH FLUOROSCOPY
Anesthesia: Moderate Sedation | Laterality: Bilateral

## 2018-02-01 ENCOUNTER — Ambulatory Visit: Payer: Medicare Other | Admitting: Oncology

## 2018-02-08 ENCOUNTER — Ambulatory Visit (INDEPENDENT_AMBULATORY_CARE_PROVIDER_SITE_OTHER): Payer: Medicare Other | Admitting: Emergency Medicine

## 2018-02-08 ENCOUNTER — Other Ambulatory Visit (INDEPENDENT_AMBULATORY_CARE_PROVIDER_SITE_OTHER): Payer: Medicare Other

## 2018-02-08 ENCOUNTER — Encounter: Payer: Self-pay | Admitting: Emergency Medicine

## 2018-02-08 VITALS — BP 126/70 | HR 122 | Ht 73.0 in | Wt 198.0 lb

## 2018-02-08 DIAGNOSIS — J438 Other emphysema: Secondary | ICD-10-CM | POA: Diagnosis not present

## 2018-02-08 DIAGNOSIS — C3491 Malignant neoplasm of unspecified part of right bronchus or lung: Secondary | ICD-10-CM | POA: Diagnosis not present

## 2018-02-08 DIAGNOSIS — R918 Other nonspecific abnormal finding of lung field: Secondary | ICD-10-CM | POA: Diagnosis not present

## 2018-02-08 LAB — BASIC METABOLIC PANEL
BUN: 16 mg/dL (ref 6–23)
CHLORIDE: 96 meq/L (ref 96–112)
CO2: 32 meq/L (ref 19–32)
Calcium: 9.3 mg/dL (ref 8.4–10.5)
Creatinine, Ser: 0.85 mg/dL (ref 0.40–1.50)
GFR: 113.25 mL/min (ref >60.00)
Glucose, Bld: 100 mg/dL — ABNORMAL HIGH (ref 70–99)
Potassium: 4.3 mEq/L (ref 3.5–5.1)
SODIUM: 134 meq/L — AB (ref 135–145)

## 2018-02-08 LAB — CBC WITH DIFFERENTIAL/PLATELET
BASOS ABS: 0.1 10*3/uL (ref 0.0–0.1)
Basophils Relative: 0.8 % (ref 0.0–3.0)
Eosinophils Absolute: 0.1 10*3/uL (ref 0.0–0.7)
Eosinophils Relative: 0.8 % (ref 0.0–5.0)
HCT: 37.5 % — ABNORMAL LOW (ref 39.0–52.0)
Hemoglobin: 12.6 g/dL — ABNORMAL LOW (ref 13.0–17.0)
Lymphocytes Relative: 13.5 % (ref 12.0–46.0)
Lymphs Abs: 1.3 10*3/uL (ref 0.7–4.0)
MCHC: 33.6 g/dL (ref 30.0–36.0)
MCV: 96.8 fl (ref 78.0–100.0)
Monocytes Absolute: 1.8 10*3/uL — ABNORMAL HIGH (ref 0.1–1.0)
Neutro Abs: 6.3 10*3/uL (ref 1.4–7.7)
Neutrophils Relative %: 66.4 % (ref 43.0–77.0)
Platelets: 277 10*3/uL (ref 150.0–400.0)
RBC: 3.88 Mil/uL — AB (ref 4.22–5.81)
RDW: 15 % (ref 11.5–15.5)
WBC: 9.5 10*3/uL (ref 4.0–10.5)

## 2018-02-08 LAB — PROTIME-INR
INR: 1.1 ratio — AB (ref 0.8–1.0)
PROTHROMBIN TIME: 11.8 s (ref 9.6–13.1)

## 2018-02-08 NOTE — Patient Instructions (Addendum)
Dr Lamonte Sakai will set up bronchoscopy for Wednesday 02/14/18 at [facility] under general anesthesia. Lab work today. Stop Plavix on Saturday 02/10/18. Do not take again until instructed after your bronchoscopy Stop aspirin on Monday 02/12/18. Do not take again until instructed after your bronchoscopy We will perform a CT scan of your chest before the bronchoscopy. Please continue your Symbicort as you have been taking it. Please continue your oxygen as you have been using it. We will review your results with Dr. Julien Nordmann as soon as they are available. Follow with Dr Lamonte Sakai in 1 month or next available.

## 2018-02-08 NOTE — H&P (View-Only) (Signed)
Subjective:    Patient ID: Ryan Contreras, male    DOB: [birth year], 77 y.o.   MRN: [id number]  HPI  77 year old gentleman, former smoker, COPD, whom I have met before in 2017.  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  He underwent concurrent chemoradiation and was followed by Dr. Julien Nordmann.  He has been followed with serial imaging.  He had a CT scan on 12/18/17 that I reviewed that showed possible local recurrence in the central aspect of the right upper lobe with an enlarging nodule.  Also noted was enlarging mixed solid and sub-solid lesion in the superior segment of the left lower lobe.  PET scan was done on 01/08/18 that I reviewed and which confirmed hypermetabolism of the right upper lobe nodule, low metabolic activity (SUV max 2.7) in the left lower lobe mixed solid/groundglass lesion.  There was no evidence of mediastinal lymphadenopathy or hypermetabolism.  Also noted with some hypermetabolism around the prostate.  A PSA done on 12/27/17 was elevated at 13.9.                                                                                                                                                                          Review of Systems  Past Medical History:  Diagnosis Date  . CAD, NATIVE VESSEL    coronary stent x1-tx. Plavix use.  Marland Kitchen COPD (chronic obstructive pulmonary disease) ([place])    due to 50+ years Smoking.  . Gastrointestinal bleeding, upper 04/29/2016  . Gout, unspecified    foot  . HYPERLIPIDEMIA   . HYPERTENSION   . NSCL ca dx'd 12/2015  . PEPTIC ULCER DISEASE   . TOBACCO ABUSE   . Type 2 diabetes mellitus ([place])    denies on 01-06-16     Family History  Problem Relation Age of Onset  . Cancer Sister        Uncertain type  . Cancer Brother        Uncertain type  . Arthritis Mother   . Hypertension Father   . Heart attack Father      Social History   Socioeconomic History  .  Marital status: Divorced    Spouse name: Not on file  . Number of children: 2  . Years of education: Not on file  . Highest education level: Not on file  Social Needs  . Financial resource strain: Not on file  . Food insecurity - worry: Not on file  . Food insecurity - inability: Not on file  . Transportation needs - medical: Not on file  . Transportation needs - non-medical: Not on file  Occupational  History  . 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 Smoking status: Current Every Day Smoker    Packs/day: 0.50    Years: 50.00    Pack years: 25.00    Types: Cigarettes  . Smokeless tobacco: Never Used  . Tobacco comment: currently smoking 1cig/day (05/23/16)  Substance and Sexual Activity  . Alcohol use: Yes    Alcohol/week: 0.0 oz    Comment: 1 beer/day  . Drug use: No  . Sexual activity: Not on file  Other Topics Concern  . Not on file  Social History Narrative   Widowed 2003     No Known Allergies   Outpatient Medications Prior to Visit  Medication Sig Dispense Refill  . acetaminophen (TYLENOL) 500 MG tablet Take 250 mg by mouth every 6 (six) hours as needed for moderate pain or headache.     . allopurinol (ZYLOPRIM) 100 MG tablet TAKE 1 TABLET BY MOUTH EVERY DAY (Patient taking differently: TAKE 100 MG BY MOUTH EVERY DAY) 90 tablet 3  . aspirin 81 MG tablet Take 81 mg by mouth at bedtime. Reported on 05/02/2016    . atorvastatin (LIPITOR) 80 MG tablet TAKE 1 TABLET BY MOUTH EVERY DAY (Patient taking differently: TAKE 80 MG BY MOUTH EVERY DAY) 30 tablet 2  . budesonide-formoterol (SYMBICORT) 160-4.5 MCG/ACT inhaler Inhale 2 puffs into the lungs 2 (two) times daily. 1 Inhaler 0  . clopidogrel (PLAVIX) 75 MG tablet Take 1 tablet (75 mg total) by mouth daily. Patient must keep 12/22/17 appointment for further refills 30 tablet 2  . docusate sodium (COLACE) 100 MG capsule Take 100 mg by mouth at bedtime.    . donepezil (ARICEPT) 10 MG tablet Take 1 tablet (10 mg total) by  mouth at bedtime. 30 tablet 11  . eplerenone (INSPRA) 25 MG tablet Take 1 tablet (25 mg total) by mouth daily. 30 tablet 5  . latanoprost (XALATAN) 0.005 % ophthalmic solution Place 1 drop into both eyes at bedtime.  6  . losartan-hydrochlorothiazide (HYZAAR) 100-12.5 MG tablet TAKE 1 TABLET BY MOUTH DAILY. 90 tablet 2  . oxybutynin (DITROPAN) 5 MG tablet Take 5 mg by mouth at bedtime.  11  . pantoprazole (PROTONIX) 40 MG tablet TAKE 1 TABLET BY MOUTH EVERY DAY (Patient taking differently: TAKE 40 MG BY MOUTH EVERY DAY) 90 tablet 3  . potassium chloride SA (KLOR-CON M20) 20 MEQ tablet Take 1 tablet (20 mEq total) by mouth daily. 30 tablet 11   No facility-administered medications prior to visit.         Objective:   Physical Exam Vitals:   02/08/18 1040 02/08/18 1041  BP:  126/70  Pulse:  (!) 122  SpO2:  95%  Weight: 198 lb (89.8 kg)   Height: _0  (1.854 m)    Gen: Pleasant, well-nourished, in no distress,  normal affect, wearing oxygen  ENT: No lesions,  mouth clear,  oropharynx clear, no postnasal drip, poor dentition  Neck: No JVD, no stridor  Lungs: No use of accessory muscles, distant, few basilar bilateral expiratory wheezes  Cardiovascular: RRR, heart sounds normal, no murmur or gallops, trace to 1+ peripheral edema  Musculoskeletal: No deformities, no cyanosis or clubbing  Neuro: alert, non focal  Skin: Warm, no lesions or rash     Assessment & Plan:  Non-small cell carcinoma of lung, stage 2 (HCC) Now with an enlarging right upper lobe nodule, concerning for local recurrence.  He also has a left lower lobe mixed attenuation nodule.  Suspect that this is primary lung cancer but note that he has an elevated PSA, some abnormality on PET scan around the prostate.  Agree that he needs a tissue diagnosis.  He is high risk for any procedure given his COPD and oxygen requirement.  I explained to him that there was a risk that he would require hospitalization after  bronchoscopy and that there was a chance of extended ventilator dependence given his COPD.  He understands the risks.  We will work on setting up navigational bronchoscopy as soon as possible.  Dr Lamonte Sakai will set up bronchoscopy for Wednesday 02/14/18 at [facility] under general anesthesia. Lab work today. Stop Plavix on Saturday 02/10/18. Do not take again until instructed after your bronchoscopy Stop aspirin on Monday 02/12/18. Do not take again until instructed after your bronchoscopy We will perform a CT scan of your chest before the bronchoscopy. We will review your results with Dr. Julien Nordmann as soon as they are available. Follow with Dr Lamonte Sakai in 1 month or next available.  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  No longer smoking.  This does increase his risk for bronchoscopy, general anesthesia.  Please continue your Symbicort as you have been taking it. Please continue your oxygen as you have been using it.  Baltazar Apo, MD, PhD 02/08/2018, 11:07 AM [place] [facility] [phone] or if no answer [phone]

## 2018-02-08 NOTE — Progress Notes (Signed)
 Subjective:    Patient ID: Ryan Contreras, male    DOB: 04/23/1941, 77 y.o.   MRN: [id number]  HPI  77-year-old gentleman, former smoker, COPD, whom I have met before in 2017.  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  He underwent concurrent chemoradiation and was followed by Dr. Mohamed.  He has been followed with serial imaging.  He had a CT scan on 12/18/17 that I reviewed that showed possible local recurrence in the central aspect of the right upper lobe with an enlarging nodule.  Also noted was enlarging mixed solid and sub-solid lesion in the superior segment of the left lower lobe.  PET scan was done on 01/08/18 that I reviewed and which confirmed hypermetabolism of the right upper lobe nodule, low metabolic activity (SUV max 2.7) in the left lower lobe mixed solid/groundglass lesion.  There was no evidence of mediastinal lymphadenopathy or hypermetabolism.  Also noted with some hypermetabolism around the prostate.  A PSA done on 12/27/17 was elevated at 13.9.                                                                                                                                                                          Review of Systems  Past Medical History:  Diagnosis Date  . CAD, NATIVE VESSEL    coronary stent x1-tx. Plavix use.  . COPD (chronic obstructive pulmonary disease) (HCC)    due to 50+ years Smoking.  . Gastrointestinal bleeding, upper 04/29/2016  . Gout, unspecified    foot  . HYPERLIPIDEMIA   . HYPERTENSION   . NSCL ca dx'd 12/2015  . PEPTIC ULCER DISEASE   . TOBACCO ABUSE   . Type 2 diabetes mellitus (HCC)    denies on 01-06-16     Family History  Problem Relation Age of Onset  . Cancer Sister        Uncertain type  . Cancer Brother        Uncertain type  . Arthritis Mother   . Hypertension Father   . Heart attack Father      Social History   Socioeconomic History  .  Marital status: Divorced    Spouse name: Not on file  . Number of children: 2  . Years of education: Not on file  . Highest education level: Not on file  Social Needs  . Financial resource strain: Not on file  . Food insecurity - worry: Not on file  . Food insecurity - inability: Not on file  . Transportation needs - medical: Not on file  . Transportation needs - non-medical: Not on file  Occupational   History  . 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 Smoking status: Current Every Day Smoker    Packs/day: 0.50    Years: 50.00    Pack years: 25.00    Types: Cigarettes  . Smokeless tobacco: Never Used  . Tobacco comment: currently smoking 1cig/day (05/23/16)  Substance and Sexual Activity  . Alcohol use: Yes    Alcohol/week: 0.0 oz    Comment: 1 beer/day  . Drug use: No  . Sexual activity: Not on file  Other Topics Concern  . Not on file  Social History Narrative   Widowed 2003     No Known Allergies   Outpatient Medications Prior to Visit  Medication Sig Dispense Refill  . acetaminophen (TYLENOL) 500 MG tablet Take 250 mg by mouth every 6 (six) hours as needed for moderate pain or headache.     . allopurinol (ZYLOPRIM) 100 MG tablet TAKE 1 TABLET BY MOUTH EVERY DAY (Patient taking differently: TAKE 100 MG BY MOUTH EVERY DAY) 90 tablet 3  . aspirin 81 MG tablet Take 81 mg by mouth at bedtime. Reported on 05/02/2016    . atorvastatin (LIPITOR) 80 MG tablet TAKE 1 TABLET BY MOUTH EVERY DAY (Patient taking differently: TAKE 80 MG BY MOUTH EVERY DAY) 30 tablet 2  . budesonide-formoterol (SYMBICORT) 160-4.5 MCG/ACT inhaler Inhale 2 puffs into the lungs 2 (two) times daily. 1 Inhaler 0  . clopidogrel (PLAVIX) 75 MG tablet Take 1 tablet (75 mg total) by mouth daily. Patient must keep 12/22/17 appointment for further refills 30 tablet 2  . docusate sodium (COLACE) 100 MG capsule Take 100 mg by mouth at bedtime.    . donepezil (ARICEPT) 10 MG tablet Take 1 tablet (10 mg total) by  mouth at bedtime. 30 tablet 11  . eplerenone (INSPRA) 25 MG tablet Take 1 tablet (25 mg total) by mouth daily. 30 tablet 5  . latanoprost (XALATAN) 0.005 % ophthalmic solution Place 1 drop into both eyes at bedtime.  6  . losartan-hydrochlorothiazide (HYZAAR) 100-12.5 MG tablet TAKE 1 TABLET BY MOUTH DAILY. 90 tablet 2  . oxybutynin (DITROPAN) 5 MG tablet Take 5 mg by mouth at bedtime.  11  . pantoprazole (PROTONIX) 40 MG tablet TAKE 1 TABLET BY MOUTH EVERY DAY (Patient taking differently: TAKE 40 MG BY MOUTH EVERY DAY) 90 tablet 3  . potassium chloride SA (KLOR-CON M20) 20 MEQ tablet Take 1 tablet (20 mEq total) by mouth daily. 30 tablet 11   No facility-administered medications prior to visit.         Objective:   Physical Exam Vitals:   02/08/18 1040 02/08/18 1041  BP:  126/70  Pulse:  (!) 122  SpO2:  95%  Weight: 198 lb (89.8 kg)   Height: 6' 1" (1.854 m)    Gen: Pleasant, well-nourished, in no distress,  normal affect, wearing oxygen  ENT: No lesions,  mouth clear,  oropharynx clear, no postnasal drip, poor dentition  Neck: No JVD, no stridor  Lungs: No use of accessory muscles, distant, few basilar bilateral expiratory wheezes  Cardiovascular: RRR, heart sounds normal, no murmur or gallops, trace to 1+ peripheral edema  Musculoskeletal: No deformities, no cyanosis or clubbing  Neuro: alert, non focal  Skin: Warm, no lesions or rash     Assessment & Plan:  Non-small cell carcinoma of lung, stage 2 (HCC) Now with an enlarging right upper lobe nodule, concerning for local recurrence.  He also has a left lower lobe mixed attenuation nodule.    Suspect that this is primary lung cancer but note that he has an elevated PSA, some abnormality on PET scan around the prostate.  Agree that he needs a tissue diagnosis.  He is high risk for any procedure given his COPD and oxygen requirement.  I explained to him that there was a risk that he would require hospitalization after  bronchoscopy and that there was a chance of extended ventilator dependence given his COPD.  He understands the risks.  We will work on setting up navigational bronchoscopy as soon as possible.  Dr Kristapher Dubuque will set up bronchoscopy for Wednesday 02/14/18 at [facility] under general anesthesia. Lab work today. Stop Plavix on Saturday 02/10/18. Do not take again until instructed after your bronchoscopy Stop aspirin on Monday 02/12/18. Do not take again until instructed after your bronchoscopy We will perform a CT scan of your chest before the bronchoscopy. We will review your results with Dr. Mohamed as soon as they are available. Follow with Dr Randale Carvalho in 1 month or next available.  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  No longer smoking.  This does increase his risk for bronchoscopy, general anesthesia.  Please continue your Symbicort as you have been taking it. Please continue your oxygen as you have been using it.  Mallie Linnemann, MD, PhD 02/08/2018, 11:07 AM [facility] [phone] or if no answer [phone]  

## 2018-02-08 NOTE — Assessment & Plan Note (Signed)
Now with an enlarging right upper lobe nodule, concerning for local recurrence.  He also has a left lower lobe mixed attenuation nodule.  Suspect that this is primary lung cancer but note that he has an elevated PSA, some abnormality on PET scan around the prostate.  Agree that he needs a tissue diagnosis.  He is high risk for any procedure given his COPD and oxygen requirement.  I explained to him that there was a risk that he would require hospitalization after bronchoscopy and that there was a chance of extended ventilator dependence given his COPD.  He understands the risks.  We will work on setting up navigational bronchoscopy as soon as possible.  Dr Lamonte Sakai will set up bronchoscopy for Wednesday 02/14/18 at [facility] under general anesthesia. Lab work today. Stop Plavix on Saturday 02/10/18. Do not take again until instructed after your bronchoscopy Stop aspirin on Monday 02/12/18. Do not take again until instructed after your bronchoscopy We will perform a CT scan of your chest before the bronchoscopy. We will review your results with Dr. Julien Nordmann as soon as they are available. Follow with Dr Lamonte Sakai in 1 month or next available.

## 2018-02-08 NOTE — Assessment & Plan Note (Signed)
Severe COPD.  No longer smoking.  This does increase his risk for bronchoscopy, general anesthesia.  Please continue your Symbicort as you have been taking it. Please continue your oxygen as you have been using it.

## 2018-02-09 ENCOUNTER — Inpatient Hospital Stay: Admission: RE | Admit: 2018-02-09 | Payer: Medicare Other | Source: Ambulatory Visit

## 2018-02-12 ENCOUNTER — Ambulatory Visit (INDEPENDENT_AMBULATORY_CARE_PROVIDER_SITE_OTHER)
Admission: RE | Admit: 2018-02-12 | Discharge: 2018-02-12 | Disposition: A | Discharge status: Home / Self Care | Payer: Medicare Other | Source: Ambulatory Visit | Attending: Emergency Medicine | Admitting: Emergency Medicine

## 2018-02-12 DIAGNOSIS — R918 Other nonspecific abnormal finding of lung field: Secondary | ICD-10-CM

## 2018-02-13 ENCOUNTER — Telehealth: Payer: Self-pay | Admitting: Emergency Medicine

## 2018-02-13 NOTE — Telephone Encounter (Signed)
Called and spoke with patient, he had the CT done yesterday on 3.4.19 and the blood work done on Thursday 2.28.19 he is asking for the results from both of these. RB please advise, thanks.

## 2018-02-13 NOTE — Telephone Encounter (Signed)
Advised pt of results. Pt understood and nothing further is needed.   

## 2018-02-13 NOTE — Telephone Encounter (Signed)
Pt is calling to see if the results came back from the CT Scan and blood workup.  [phone]

## 2018-02-13 NOTE — Telephone Encounter (Signed)
I reviewed his Ct scan and blood work. His nodules are unchanged and we will be able to use the CT scan to perform navigational bronchoscopy as planned for 3/13.

## 2018-02-14 ENCOUNTER — Telehealth: Payer: Self-pay | Admitting: Endocrinology

## 2018-02-14 ENCOUNTER — Telehealth: Payer: Self-pay | Admitting: Emergency Medicine

## 2018-02-14 NOTE — Telephone Encounter (Signed)
I called & spoke with patient asking him to refer to his pulmonologist.

## 2018-02-14 NOTE — Telephone Encounter (Signed)
Epic says pulmonary.  Please direct questions there

## 2018-02-14 NOTE — Telephone Encounter (Signed)
Called and spoke with patient in regards to this, he states that he was supposed to have blood work done here at our office on 3.7.19. I advised patient that he is to have pre admission tomorrow which he was aware of but not here at our office. Nothing further needed.

## 2018-02-14 NOTE — Pre-Procedure Instructions (Signed)
Ryan Contreras  02/14/2018      [id number] [address], [place] [address] Phone: [phone] Fax: [phone]    Your procedure is scheduled on February 21, 2018.  Report to [facility]mitting at 630 AM.  Call this number if you have problems the morning of surgery:  [phone]   Remember:  Do not eat food or drink liquids after midnight.  Take these medicines the morning of surgery with A SIP OF WATER tylenol, allopurinol (zyloprim), symbicort inhaler-bring with you, donepazil (aricept), eye drops, pantoprazole (protonix).  Stop/resume plavix as instructed by your surgeon  7 days prior to surgery STOP taking any Aspirin (unless otherwise instructed by your surgeon), Aleve, Naproxen, Ibuprofen, Motrin, Advil, Goody's, BC's, all herbal medications, fish oil, and all vitamins  Continue all other medications as instructed by your physician except follow the above medication instructions before surgery   Do not wear jewelry.  Do not wear lotions, powders, or colognes, or deodorant.  Men may shave face and neck.  Do not bring valuables to the hospital.  [facility] is not responsible for any belongings or valuables.  Contacts, dentures or bridgework may not be worn into surgery.  Leave your suitcase in the car.  After surgery it may be brought to your room.  For patients admitted to the hospital, discharge time will be determined by your treatment team.  Patients discharged the day of surgery will not be allowed to drive home.   Special instructions:   [facility]- Preparing For Surgery  Before surgery, you can play an important role. Because skin is not sterile, your skin needs to be as free of germs as possible. You can reduce the number of germs on your skin by washing with CHG (chlorahexidine gluconate) Soap before surgery.  CHG is an antiseptic cleaner which kills germs and bonds  with the skin to continue killing germs even after washing.  Please do not use if you have an allergy to CHG or antibacterial soaps. If your skin becomes reddened/irritated stop using the CHG.  Do not shave (including legs and underarms) for at least 48 hours prior to first CHG shower. It is OK to shave your face.  Please follow these instructions carefully.   1. Shower the NIGHT BEFORE SURGERY and the MORNING OF SURGERY with CHG.   2. If you chose to wash your hair, wash your hair first as usual with your normal shampoo.  3. After you shampoo, rinse your hair and body thoroughly to remove the shampoo.  4. Use CHG as you would any other liquid soap. You can apply CHG directly to the skin and wash gently with a scrungie or a clean washcloth.   5. Apply the CHG Soap to your body ONLY FROM THE NECK DOWN.  Do not use on open wounds or open sores. Avoid contact with your eyes, ears, mouth and genitals (private parts). Wash Face and genitals (private parts)  with your normal soap.  6. Wash thoroughly, paying special attention to the area where your surgery will be performed.  7. Thoroughly rinse your body with warm water from the neck down.  8. DO NOT shower/wash with your normal soap after using and rinsing off the CHG Soap.  9. Pat yourself dry with a CLEAN TOWEL.  10. Wear CLEAN PAJAMAS to bed the night before surgery, wear comfortable clothes the morning of surgery  11. Place CLEAN SHEETS on your bed the night of your first shower and DO NOT SLEEP WITH PETS.  Day of Surgery: Do not apply any deodorants/lotions. Please wear clean clothes to the hospital/surgery center.    Please read over the following fact sheets that you were given. Pain Booklet, Coughing and Deep Breathing, MRSA Information and Surgical Site Infection Prevention

## 2018-02-14 NOTE — Telephone Encounter (Signed)
I am also unsure of what to advise patient in regards to this?

## 2018-02-14 NOTE — Telephone Encounter (Addendum)
Patient has an appointment tomorrow at 10:30 with  PRE-ADMISSION TESTING 45 [[id number]]    [facility] PAT 4    He do not know what the appointment is about or where it is There was not enough information for me to help him. Could you call before tomorrow please

## 2018-02-15 ENCOUNTER — Telehealth: Payer: Self-pay | Admitting: Oncology

## 2018-02-15 ENCOUNTER — Inpatient Hospital Stay (HOSPITAL_COMMUNITY)
Admission: RE | Admit: 2018-02-15 | Discharge: 2018-02-15 | Disposition: A | Discharge status: Home / Self Care | Payer: Medicare Other | Source: Ambulatory Visit

## 2018-02-15 ENCOUNTER — Telehealth: Payer: Self-pay | Admitting: Emergency Medicine

## 2018-02-15 NOTE — Progress Notes (Signed)
Pt late for pre-admissions appointment. Called home phone number on file, dtr answered phone stating that patient was currently admitted at [facility] being treated for pneumonia.   Called's Dr Agustina Caroli office and made aware.

## 2018-02-15 NOTE — Telephone Encounter (Signed)
Scheduled appt per 3/7 sch message - left message for patient with appt date and time. - sent reminder letter in the mail.

## 2018-02-15 NOTE — Telephone Encounter (Signed)
Called patient but unable to reach. RB FYI patient was is currently admitted to hospital and did not make it to pre admission labs at [facility] for bronch.

## 2018-02-16 ENCOUNTER — Telehealth: Payer: Self-pay | Admitting: Endocrinology

## 2018-02-16 NOTE — Telephone Encounter (Signed)
Today 1 PM

## 2018-02-16 NOTE — Telephone Encounter (Signed)
This patient is needing to be seen 5-7 days for hospital follow up. (COPD)  When could you see this patient for this?

## 2018-02-16 NOTE — Telephone Encounter (Signed)
Patient has not been discharged yet from [facility].

## 2018-02-16 NOTE — Telephone Encounter (Signed)
I called and LVM asking patient to call back to schedule hospital f/u at suggested time.

## 2018-02-16 NOTE — Telephone Encounter (Signed)
9:45 am, 02/21/18

## 2018-02-17 MED ORDER — AZITHROMYCIN 250 MG PO TABS
250.00 mg | ORAL_TABLET | ORAL | Status: DC
Start: 2018-02-18 — End: 2018-02-17

## 2018-02-17 MED ORDER — GENERIC EXTERNAL MEDICATION
2.00 | Status: DC
Start: 2018-02-17 — End: 2018-02-17

## 2018-02-17 MED ORDER — TAMSULOSIN HCL 0.4 MG PO CAPS
0.40 mg | ORAL_CAPSULE | ORAL | Status: DC
Start: 2018-02-18 — End: 2018-02-17

## 2018-02-17 MED ORDER — ALLOPURINOL 300 MG PO TABS
300.00 mg | ORAL_TABLET | ORAL | Status: DC
Start: 2018-02-18 — End: 2018-02-17

## 2018-02-17 MED ORDER — CLOPIDOGREL BISULFATE 75 MG PO TABS
75.00 mg | ORAL_TABLET | ORAL | Status: DC
Start: 2018-02-18 — End: 2018-02-17

## 2018-02-17 MED ORDER — ACETAMINOPHEN 325 MG PO TABS
650.00 mg | ORAL_TABLET | ORAL | Status: DC
Start: ? — End: 2018-02-17

## 2018-02-17 MED ORDER — ENOXAPARIN SODIUM 40 MG/0.4ML ~~LOC~~ SOLN
40.00 mg | SUBCUTANEOUS | Status: DC
Start: 2018-02-17 — End: 2018-02-17

## 2018-02-17 MED ORDER — ASPIRIN EC 81 MG PO TBEC
81.00 mg | DELAYED_RELEASE_TABLET | ORAL | Status: DC
Start: 2018-02-18 — End: 2018-02-17

## 2018-02-17 MED ORDER — GENERIC EXTERNAL MEDICATION
Status: DC
Start: 2018-02-18 — End: 2018-02-17

## 2018-02-17 MED ORDER — ATORVASTATIN CALCIUM 40 MG PO TABS
80.00 mg | ORAL_TABLET | ORAL | Status: DC
Start: 2018-02-18 — End: 2018-02-17

## 2018-02-17 MED ORDER — GENERIC EXTERNAL MEDICATION
1.00 | Status: DC
Start: 2018-02-17 — End: 2018-02-17

## 2018-02-17 MED ORDER — ALBUTEROL SULFATE (2.5 MG/3ML) 0.083% IN NEBU
2.50 mg | INHALATION_SOLUTION | RESPIRATORY_TRACT | Status: DC
Start: ? — End: 2018-02-17

## 2018-02-17 MED ORDER — IPRATROPIUM-ALBUTEROL 0.5-2.5 (3) MG/3ML IN SOLN
3.00 | RESPIRATORY_TRACT | Status: DC
Start: 2018-02-17 — End: 2018-02-17

## 2018-02-17 MED ORDER — CEFDINIR 300 MG PO CAPS
300.00 mg | ORAL_CAPSULE | ORAL | Status: DC
Start: 2018-02-17 — End: 2018-02-17

## 2018-02-17 MED ORDER — PREDNISONE 20 MG PO TABS
40.00 mg | ORAL_TABLET | ORAL | Status: DC
Start: 2018-02-18 — End: 2018-02-17

## 2018-02-19 ENCOUNTER — Telehealth: Payer: Self-pay | Admitting: Endocrinology

## 2018-02-19 NOTE — Telephone Encounter (Signed)
Patient wants Dr Cordelia Pen Nurse to call him at ph# [phone] or ph# [phone] re: go over reports sent from hospitalization

## 2018-02-19 NOTE — Telephone Encounter (Signed)
I called patient & he is scheduled for his hospital f/u Wed. 1pm.

## 2018-02-19 NOTE — Telephone Encounter (Signed)
I have called and scheduled patient hospital f/u 1pm Wednesday.

## 2018-02-19 NOTE — Telephone Encounter (Signed)
Social worker at [facility] letting Nurse know that when Nurse goes over hospitalization reports with patient that patient get an after hospitalization f/u appointment at our office-and to make sure to mention that when speaking with patient or patient's family member

## 2018-02-20 ENCOUNTER — Telehealth: Payer: Self-pay | Admitting: Emergency Medicine

## 2018-02-20 ENCOUNTER — Ambulatory Visit: Payer: Medicare Other | Admitting: Oncology

## 2018-02-20 NOTE — Telephone Encounter (Signed)
Patient is currently scheduled to have a bronch tomorrow with RB. Per Sherlynn Stalls, the patient did not stop taking his Plavix as told. She wants to know what to do next.   RB, please advise. Thanks!

## 2018-02-20 NOTE — Telephone Encounter (Signed)
There is a duplicate message on this matter. [facility] was notified to cancel this bronch. Message will be closed.

## 2018-02-20 NOTE — Telephone Encounter (Signed)
Spoke with pt. He has not stopped his ASA or Plavix. Advised him that we would need to reschedule his bronch for 02/21/18. He verbalized understanding and apologized for not stopping his medication. I will route this message to [facility] to make her aware to reschedule this [name].

## 2018-02-20 NOTE — Telephone Encounter (Signed)
Short stay let me know that he is out of [facility], but that he never stopped his ASA or plavix. Need to call him to confirm. If he hasn't stopped them then we need to reschedule his [name], can't do it 3/13. Thanks/.

## 2018-02-20 NOTE — Telephone Encounter (Signed)
I will cancel the procedure.

## 2018-02-20 NOTE — Progress Notes (Signed)
Pt stated that he has not stopped taking Aspirin and Plavix. Spoke with Andee Poles to make MD aware; awaiting a return call.

## 2018-02-20 NOTE — Telephone Encounter (Signed)
Will hold this message until RB responds. Per Sherlynn Stalls at [facility], the patient did not stop taking his Plavix prior to appt. May need to reschedule appt.   Will await RB's response.

## 2018-02-21 ENCOUNTER — Ambulatory Visit (HOSPITAL_COMMUNITY): Admission: RE | Admit: 2018-02-21 | Payer: Medicare Other | Source: Ambulatory Visit | Admitting: Emergency Medicine

## 2018-02-21 ENCOUNTER — Ambulatory Visit (INDEPENDENT_AMBULATORY_CARE_PROVIDER_SITE_OTHER): Payer: Medicare Other | Admitting: Endocrinology

## 2018-02-21 ENCOUNTER — Encounter (HOSPITAL_COMMUNITY): Admission: RE | Payer: Self-pay | Source: Ambulatory Visit

## 2018-02-21 ENCOUNTER — Encounter: Payer: Self-pay | Admitting: Endocrinology

## 2018-02-21 ENCOUNTER — Telehealth: Payer: Self-pay | Admitting: Emergency Medicine

## 2018-02-21 VITALS — BP 122/58 | HR 111 | Wt 195.8 lb

## 2018-02-21 DIAGNOSIS — J438 Other emphysema: Secondary | ICD-10-CM

## 2018-02-21 SURGERY — VIDEO BRONCHOSCOPY WITH ENDOBRONCHIAL NAVIGATION
Anesthesia: General

## 2018-02-21 NOTE — Progress Notes (Signed)
Subjective:    Patient ID: Ryan Contreras, male    DOB: 11-08-1941, 77 y.o.   MRN: [id number]  HPI Pt was in the hospital with pneumonia 1 week ago.  He has slight dyspnea sensation in the chest, but no assoc fever.  He feels better now.  He finished prednisone a few days ago.   Past Medical History:  Diagnosis Date  . CAD, NATIVE VESSEL    coronary stent x1-tx. Plavix use.  Marland Kitchen COPD (chronic obstructive pulmonary disease) ([place])    due to 50+ years Smoking.  . Gastrointestinal bleeding, upper 04/29/2016  . Gout, unspecified    foot  . HYPERLIPIDEMIA   . HYPERTENSION   . NSCL ca dx'd 12/2015  . PEPTIC ULCER DISEASE   . TOBACCO ABUSE   . Type 2 diabetes mellitus ([place])    denies on 01-06-16    Past Surgical History:  Procedure Laterality Date  . CARDIAC CATHETERIZATION     '06-stent placed, 6'13 - Dr. Lizbeth Bark follows  . CORONARY STENT PLACEMENT     s/p  . ENDOBRONCHIAL ULTRASOUND Bilateral 01/11/2016   Procedure: ENDOBRONCHIAL ULTRASOUND;  Surgeon: Collene Gobble, MD;  Location: [facility];  Service: Endoscopy;  Laterality: Bilateral;    Social History   Socioeconomic History  . Marital status: Divorced    Spouse name: Not on file  . Number of children: 2  . Years of education: Not on file  . Highest education level: Not on file  Social Needs  . Financial resource strain: Not on file  . Food insecurity - worry: Not on file  . Food insecurity - inability: Not on file  . Transportation needs - medical: Not on file  . Transportation needs - non-medical: Not on file  Occupational History  . 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 Smoking status: Current Every Day Smoker    Packs/day: 0.50    Years: 50.00    Pack years: 25.00    Types: Cigarettes  . Smokeless tobacco: Never Used  . Tobacco comment: currently smoking 1cig/day (05/23/16)  Substance and Sexual Activity  . Alcohol use: Yes    Alcohol/week: 0.0 oz    Comment: 1 beer/day  . Drug use: No  . Sexual  activity: Not on file  Other Topics Concern  . Not on file  Social History Narrative   Widowed 2003    Current Outpatient Medications on File Prior to Visit  Medication Sig Dispense Refill  . acetaminophen (TYLENOL) 500 MG tablet Take 250 mg by mouth every 6 (six) hours as needed for moderate pain or headache.     . allopurinol (ZYLOPRIM) 100 MG tablet TAKE 1 TABLET BY MOUTH EVERY DAY (Patient taking differently: TAKE 100 MG BY MOUTH EVERY DAY) 90 tablet 3  . aspirin 81 MG tablet Take 81 mg by mouth at bedtime. Reported on 05/02/2016    . atorvastatin (LIPITOR) 80 MG tablet TAKE 1 TABLET BY MOUTH EVERY DAY (Patient taking differently: TAKE 80 MG BY MOUTH EVERY DAY) 30 tablet 2  . budesonide-formoterol (SYMBICORT) 160-4.5 MCG/ACT inhaler Inhale 2 puffs into the lungs 2 (two) times daily. 1 Inhaler 0  . clopidogrel (PLAVIX) 75 MG tablet Take 1 tablet (75 mg total) by mouth daily. Patient must keep 12/22/17 appointment for further refills 30 tablet 2  . docusate sodium (COLACE) 100 MG capsule Take 100 mg by mouth at bedtime.    . donepezil (ARICEPT) 10 MG tablet Take 1 tablet (10 mg total)  by mouth at bedtime. 30 tablet 11  . eplerenone (INSPRA) 25 MG tablet Take 1 tablet (25 mg total) by mouth daily. 30 tablet 5  . latanoprost (XALATAN) 0.005 % ophthalmic solution Place 1 drop into both eyes at bedtime.  6  . losartan-hydrochlorothiazide (HYZAAR) 100-12.5 MG tablet TAKE 1 TABLET BY MOUTH DAILY. 90 tablet 2  . oxybutynin (DITROPAN) 5 MG tablet Take 5 mg by mouth at bedtime.  11  . pantoprazole (PROTONIX) 40 MG tablet TAKE 1 TABLET BY MOUTH EVERY DAY (Patient taking differently: TAKE 40 MG BY MOUTH EVERY DAY) 90 tablet 3  . potassium chloride SA (KLOR-CON M20) 20 MEQ tablet Take 1 tablet (20 mEq total) by mouth daily. 30 tablet 11   No current facility-administered medications on file prior to visit.     No Known Allergies  Family History  Problem Relation Age of Onset  . Cancer Sister         Uncertain type  . Cancer Brother        Uncertain type  . Arthritis Mother   . Hypertension Father   . Heart attack Father     BP (!) 122/58 (BP Location: Left Arm, Patient Position: Sitting, Cuff Size: Normal)   Pulse (!) 111   Wt 195 lb 12.8 oz (88.8 kg)   SpO2 95%   BMI 25.83 kg/m    Review of Systems Cough is minimal.  He has lost 2 lbs    Objective:   Physical Exam VITAL SIGNS:  See vs page GENERAL: no distress.  has 02 on  LUNGS:  Clear to auscultation.   CXR: heart is top-normal in size. There is minimal aortic atherosclerosis. Emphysematous hyperinflation of the lungs with right upper lobe scarring is identified. The known right suprahilar right upper lobe spiculated mass is partially obscured by adjacent pulmonary vasculature and is only partially visualized. No additional pulmonary masses are apparent. Opacity overlying the right costophrenic angle is believed to be secondary to atelectasis given lack of a pulmonary mass seen on recent CT from a few days ago. No aggressive osseous lesions.  CT result is not yet available on care everywhere.     Assessment & Plan:  Pneumonia, clinically improved.  Abnormal CXR: needs pulm f/u.  COPD: I advised prednisone taper, but he declines.   Patient Instructions  If you have not heard back from Dr Lamonte Sakai soon, please call about the bronchoscopy.  Also please call if your breathing gets worse.

## 2018-02-21 NOTE — Telephone Encounter (Signed)
There have been multiple messages about this same matter. Dr. Lamonte Sakai has already addressed this. The pt knows that this procedure has been canceled and will be rescheduled.

## 2018-02-21 NOTE — Patient Instructions (Addendum)
If you have not heard back from Dr Lamonte Sakai soon, please call about the bronchoscopy.  Also please call if your breathing gets worse.

## 2018-02-21 NOTE — Telephone Encounter (Signed)
Left message for patient to call back  

## 2018-02-23 NOTE — Telephone Encounter (Signed)
I do not see where anyone from our office called him. lmtcb x2 for pt.

## 2018-02-26 ENCOUNTER — Telehealth: Payer: Self-pay | Admitting: Medical Oncology

## 2018-02-26 NOTE — Telephone Encounter (Signed)
Called patient, unable to reach left message to give us a call back. 

## 2018-02-26 NOTE — Telephone Encounter (Signed)
Pt called to cancel appt for tomorrow. Ryan has not heard back form Dr Ryan Contreras office for bx. I sent request to move  f/u with Ryan Contreras to week of April 1.

## 2018-02-26 NOTE — Telephone Encounter (Signed)
Spoke with the pt  He states he is calling to find out when his bronch is going to be rescheduled  RB- please advise thanks

## 2018-02-26 NOTE — Telephone Encounter (Signed)
Patient needs to be set up for [name] - is this being done? Thanks.

## 2018-02-26 NOTE — Telephone Encounter (Signed)
Pt is calling back [phone]

## 2018-02-26 NOTE — Telephone Encounter (Signed)
I had asked for this to be rescheduled.  It is an [name], will need to be done thru [facility]

## 2018-02-27 ENCOUNTER — Other Ambulatory Visit: Payer: Medicare Other

## 2018-02-27 ENCOUNTER — Ambulatory Visit: Payer: Medicare Other | Admitting: Oncology

## 2018-02-27 NOTE — Telephone Encounter (Signed)
[facility] - has this [name] been rescheduled??

## 2018-02-27 NOTE — Telephone Encounter (Signed)
[name] rescheduled for 03/07/18@8 :30am pt and Dr Lamonte Sakai are aware pt was advised to stop coumadin after Friday morning dose until told oytherwise Ryan Contreras

## 2018-02-27 NOTE — Telephone Encounter (Signed)
[facility], can you help with this? Thanks

## 2018-03-06 ENCOUNTER — Encounter (HOSPITAL_COMMUNITY): Payer: Self-pay | Admitting: *Deleted

## 2018-03-06 ENCOUNTER — Other Ambulatory Visit: Payer: Self-pay

## 2018-03-06 ENCOUNTER — Telehealth: Payer: Self-pay | Admitting: Emergency Medicine

## 2018-03-06 NOTE — Progress Notes (Signed)
Pt stated that he takes Aspirin at night. Pt made aware to not take scheduled dose of Aspirin tonight. Pt verbalized understanding of instructions.

## 2018-03-06 NOTE — Progress Notes (Signed)
Pt denies any acute cardiopulmonary issues. Pt under the care of Dr. Harrington Challenger, Cardiology. Requested EKG tracing from [facility] and recent labs from Dr. Junious Silk, Urology. Pt made aware to stop taking vitamins, fish oil, and herbal medications. Do not take any NSAIDs ie: Ibuprofen, Advil, Naproxen (Aleve), Motrin, BC and Goody Powder. Pt stated that last dose of Plavix was Friday as instructed. Pt verbalized understanding of all pre-op instructions. Anesthesia asked to review pt history.

## 2018-03-06 NOTE — Progress Notes (Signed)
Anesthesia Chart Review:  Pt is a same day work up.   Pt is a 77 year old male scheduled for video bronchoscopy with endobronchial navigation on 03/07/2018 with Baltazar Apo, MD  - PCP is Renato Shin, MD - Oncologist is Curt Bears, MD - Cardiologist is Dorris Carnes, MD. Last office visit 12/22/17  PMH includes:  CAD (RCA stent), HTN, DM, hyperlipidemia, COPD, [place] lung cancer, uses O2 prn. Former smoker. BMI 26  - Hospitalized 3/6-02/17/18 at [facility] for CAP, COPD exacerbation   Medications include: ASA 81mg , lipitor, symbicort, plavix, inspra, losartan-hctz, protonix, potassium, albuterol. Last dose plavix 03/02/18. Pt has not stopped ASA.   Labs will be obtained day of surgery  CXR 02/14/18 (care everywhere):  1. Emphysematous hyperinflation of the lungs. 2. Partially obscured spiculated mass in the right upper lobe is superimposed on pulmonary vasculature. No additional masses are identified. 3. Probable atelectasis at the right costophrenic angle given lack of a mass seen on recent CT.  EKG 12/22/17: NSR  CT Super D Chest 02/12/18:  1. The right upper lobe perihilar lung mass is similar in size and appearance compared with 12/18/2017. As mentioned previously this is worrisome for tumor recurrence. 2. Stable appearance of sub solid lesion within the medial left lower lobe which is worrisome for primary bronchogenic adenocarcinoma. 3. Aortic atherosclerosis and 3 vessel coronary artery calcification. 4. Emphysema   Echo 03/28/16 (care everywhere):  - LV cavity is small. LV is hyperdynamic. Regional wall motion abnormalities cannot be excluded due to limited  visualization. - RV is not well visualized. Suspect dilated right ventricle with reduced systolic function. - LA is not well visualized. - RA not well visualized. - Aortic valve is not well visualized. - Mitral valve is normal in structure and function. - Tricuspid valve is not well visualized. - RVSP not able to be  calculated. - Pulmonic valve is not well visualized. - There is no pericardial effusion. - There is no comparison study available.  Cardiac cath 06/08/12:  - Right dominant with no anomalies - LM: Normal - LAD: normal proximally, mid vessel has a hypodense area with moderate 60% stenosis.  Difficult to isolate from D1 take off.  Looks similar to last cath in 2006 Distal vessel is normal. D1: 40% ostial  - Circumflex: 40% mid vessel disease. OM1: large branching artery mid circumflex lesion involves take off of OM 40% - RCA: Widely patent stent in proximal vessel  40% mid vessel disease normal distal vessel. PDA: normal. PLA: normal - Ventriculography: EF: 55 %, minimsl inferobasal hypokinesis  If labs acceptable day of surgery, I anticipate pt can proceed with surgery as scheduled.   Willeen Cass, FNP-BC [facility]/Anesthesiology Phone: [phone] 03/06/2018 2:03 PM

## 2018-03-06 NOTE — Progress Notes (Signed)
Patrice, Surgical Coordinator, to make MD aware that pt has not stopped taking Aspirin; awaiting a return call.

## 2018-03-06 NOTE — Anesthesia Preprocedure Evaluation (Addendum)
Anesthesia Evaluation  Patient identified by MRN, date of birth, ID band Patient awake    Reviewed: Allergy & Precautions, H&P , NPO status , Patient's Chart, lab work & pertinent test results  Airway Mallampati: II  TM Distance: >3 FB Neck ROM: Full    Dental no notable dental hx. (+) Poor Dentition, Dental Advisory Given   Pulmonary COPD,  COPD inhaler, former smoker,    Pulmonary exam normal breath sounds clear to auscultation       Cardiovascular Exercise Tolerance: Good hypertension, Pt. on medications + CAD and + Cardiac Stents   Rhythm:Regular Rate:Normal     Neuro/Psych negative neurological ROS  negative psych ROS   GI/Hepatic Neg liver ROS, PUD,   Endo/Other  diabetes  Renal/GU negative Renal ROS  negative genitourinary   Musculoskeletal  (+) Arthritis , Osteoarthritis,    Abdominal   Peds  Hematology negative hematology ROS (+) anemia ,   Anesthesia Other Findings   Reproductive/Obstetrics negative OB ROS                            Anesthesia Physical Anesthesia Plan  ASA: III  Anesthesia Plan: General   Post-op Pain Management:    Induction: Intravenous  PONV Risk Score and Plan: 3 and Ondansetron, Dexamethasone and Treatment may vary due to age or medical condition  Airway Management Planned: Oral ETT  Additional Equipment:   Intra-op Plan:   Post-operative Plan: Extubation in OR  Informed Consent: I have reviewed the patients History and Physical, chart, labs and discussed the procedure including the risks, benefits and alternatives for the proposed anesthesia with the patient or authorized representative who has indicated his/her understanding and acceptance.   Dental advisory given  Plan Discussed with: CRNA  Anesthesia Plan Comments:         Anesthesia Quick Evaluation

## 2018-03-06 NOTE — Telephone Encounter (Signed)
Spoke with RB over the phone. As long as the pt has not had his ASA today, we can continue with the bronch tomorrow.  Spoke with [employer]. The pt has not had ASA today. Sherlynn Stalls is going to call the pt and let him know that the bronch is still on for tomorrow. Nothing further was needed.

## 2018-03-07 ENCOUNTER — Other Ambulatory Visit: Payer: Self-pay | Admitting: Pulmonary Disease

## 2018-03-07 ENCOUNTER — Ambulatory Visit (HOSPITAL_COMMUNITY)
Admission: RE | Admit: 2018-03-07 | Discharge: 2018-03-07 | Disposition: A | Discharge status: Home / Self Care | Payer: Medicare Other | Source: Ambulatory Visit | Attending: Emergency Medicine | Admitting: Emergency Medicine

## 2018-03-07 ENCOUNTER — Encounter (HOSPITAL_COMMUNITY)
Admission: RE | Disposition: A | Discharge status: Home / Self Care | Payer: Self-pay | Source: Ambulatory Visit | Attending: Emergency Medicine

## 2018-03-07 ENCOUNTER — Ambulatory Visit (HOSPITAL_COMMUNITY): Payer: Medicare Other | Admitting: Emergency Medicine

## 2018-03-07 ENCOUNTER — Ambulatory Visit (HOSPITAL_COMMUNITY): Payer: Medicare Other

## 2018-03-07 ENCOUNTER — Encounter (HOSPITAL_COMMUNITY): Payer: Self-pay | Admitting: Critical Care Medicine

## 2018-03-07 DIAGNOSIS — M1990 Unspecified osteoarthritis, unspecified site: Secondary | ICD-10-CM | POA: Insufficient documentation

## 2018-03-07 DIAGNOSIS — Z79899 Other long term (current) drug therapy: Secondary | ICD-10-CM | POA: Insufficient documentation

## 2018-03-07 DIAGNOSIS — I10 Essential (primary) hypertension: Secondary | ICD-10-CM | POA: Insufficient documentation

## 2018-03-07 DIAGNOSIS — Z7902 Long term (current) use of antithrombotics/antiplatelets: Secondary | ICD-10-CM | POA: Insufficient documentation

## 2018-03-07 DIAGNOSIS — E785 Hyperlipidemia, unspecified: Secondary | ICD-10-CM | POA: Diagnosis not present

## 2018-03-07 DIAGNOSIS — Z85118 Personal history of other malignant neoplasm of bronchus and lung: Secondary | ICD-10-CM | POA: Diagnosis not present

## 2018-03-07 DIAGNOSIS — R918 Other nonspecific abnormal finding of lung field: Secondary | ICD-10-CM | POA: Diagnosis not present

## 2018-03-07 DIAGNOSIS — J449 Chronic obstructive pulmonary disease, unspecified: Secondary | ICD-10-CM | POA: Diagnosis not present

## 2018-03-07 DIAGNOSIS — Z955 Presence of coronary angioplasty implant and graft: Secondary | ICD-10-CM | POA: Insufficient documentation

## 2018-03-07 DIAGNOSIS — Z9889 Other specified postprocedural states: Secondary | ICD-10-CM

## 2018-03-07 DIAGNOSIS — I2510 Atherosclerotic heart disease of native coronary artery without angina pectoris: Secondary | ICD-10-CM | POA: Insufficient documentation

## 2018-03-07 DIAGNOSIS — C3411 Malignant neoplasm of upper lobe, right bronchus or lung: Secondary | ICD-10-CM | POA: Diagnosis present

## 2018-03-07 DIAGNOSIS — C3491 Malignant neoplasm of unspecified part of right bronchus or lung: Secondary | ICD-10-CM

## 2018-03-07 DIAGNOSIS — E119 Type 2 diabetes mellitus without complications: Secondary | ICD-10-CM | POA: Insufficient documentation

## 2018-03-07 DIAGNOSIS — M109 Gout, unspecified: Secondary | ICD-10-CM | POA: Diagnosis not present

## 2018-03-07 DIAGNOSIS — Z8711 Personal history of peptic ulcer disease: Secondary | ICD-10-CM | POA: Diagnosis not present

## 2018-03-07 DIAGNOSIS — F17210 Nicotine dependence, cigarettes, uncomplicated: Secondary | ICD-10-CM | POA: Insufficient documentation

## 2018-03-07 DIAGNOSIS — Z419 Encounter for procedure for purposes other than remedying health state, unspecified: Secondary | ICD-10-CM

## 2018-03-07 DIAGNOSIS — Z7982 Long term (current) use of aspirin: Secondary | ICD-10-CM | POA: Insufficient documentation

## 2018-03-07 HISTORY — PX: VIDEO BRONCHOSCOPY WITH ENDOBRONCHIAL NAVIGATION: SHX6175

## 2018-03-07 HISTORY — DX: Pneumonia, unspecified organism: J18.9

## 2018-03-07 HISTORY — DX: Other nonspecific abnormal finding of lung field: R91.8

## 2018-03-07 HISTORY — DX: Unspecified osteoarthritis, unspecified site: M19.90

## 2018-03-07 HISTORY — DX: Other specified health status: Z78.9

## 2018-03-07 HISTORY — DX: Dyspnea, unspecified: R06.00

## 2018-03-07 LAB — CBC
HCT: 35.6 % — ABNORMAL LOW (ref 39.0–52.0)
HEMOGLOBIN: 11.6 g/dL — AB (ref 13.0–17.0)
MCH: 31.3 pg (ref 26.0–34.0)
MCHC: 32.6 g/dL (ref 30.0–36.0)
MCV: 96 fL (ref 78.0–100.0)
PLATELETS: 321 10*3/uL (ref 150–400)
RBC: 3.71 MIL/uL — AB (ref 4.22–5.81)
RDW: 16.1 % — ABNORMAL HIGH (ref 11.5–15.5)
WBC: 9.9 10*3/uL (ref 4.0–10.5)

## 2018-03-07 LAB — BASIC METABOLIC PANEL
ANION GAP: 12 (ref 5–15)
BUN: 11 mg/dL (ref 6–20)
CALCIUM: 8.7 mg/dL — AB (ref 8.9–10.3)
CO2: 26 mmol/L (ref 22–32)
Chloride: 98 mmol/L — ABNORMAL LOW (ref 101–111)
Creatinine, Ser: 0.76 mg/dL (ref 0.61–1.24)
Glucose, Bld: 104 mg/dL — ABNORMAL HIGH (ref 65–99)
Potassium: 4.3 mmol/L (ref 3.5–5.1)
SODIUM: 136 mmol/L (ref 135–145)

## 2018-03-07 LAB — GLUCOSE, CAPILLARY
GLUCOSE-CAPILLARY: 101 mg/dL — AB (ref 65–99)
Glucose-Capillary: 103 mg/dL — ABNORMAL HIGH (ref 65–99)

## 2018-03-07 SURGERY — VIDEO BRONCHOSCOPY WITH ENDOBRONCHIAL NAVIGATION
Anesthesia: General | Laterality: Right

## 2018-03-07 MED ORDER — SUGAMMADEX SODIUM 200 MG/2ML IV SOLN
INTRAVENOUS | Status: DC | PRN
  Administered 2018-03-07: 177 mg via INTRAVENOUS

## 2018-03-07 MED ORDER — ROCURONIUM BROMIDE 10 MG/ML (PF) SYRINGE
PREFILLED_SYRINGE | INTRAVENOUS | Status: DC | PRN
  Administered 2018-03-07: 10 mg via INTRAVENOUS
  Administered 2018-03-07: 40 mg via INTRAVENOUS

## 2018-03-07 MED ORDER — 0.9 % SODIUM CHLORIDE (POUR BTL) OPTIME
TOPICAL | Status: DC | PRN
  Administered 2018-03-07: 1000 mL

## 2018-03-07 MED ORDER — PHENYLEPHRINE HCL 0.25 % NA SOLN
1.0000 | Freq: Four times a day (QID) | NASAL | Status: DC | PRN
  Filled 2018-03-07: qty 15

## 2018-03-07 MED ORDER — PHENYLEPHRINE HCL 10 MG/ML IJ SOLN
INTRAVENOUS | Status: DC | PRN
  Administered 2018-03-07: 50 ug/min via INTRAVENOUS

## 2018-03-07 MED ORDER — FENTANYL CITRATE (PF) 100 MCG/2ML IJ SOLN: 25.0000 ug | INTRAMUSCULAR | Status: DC | PRN

## 2018-03-07 MED ORDER — PROPOFOL 10 MG/ML IV BOLUS
INTRAVENOUS | Status: DC | PRN
  Administered 2018-03-07: 100 mg via INTRAVENOUS

## 2018-03-07 MED ORDER — LIDOCAINE 2% (20 MG/ML) 5 ML SYRINGE
INTRAMUSCULAR | Status: DC | PRN
  Administered 2018-03-07: 60 mg via INTRAVENOUS

## 2018-03-07 MED ORDER — PROPOFOL 10 MG/ML IV BOLUS
INTRAVENOUS | Status: AC
  Filled 2018-03-07: qty 20

## 2018-03-07 MED ORDER — PANTOPRAZOLE SODIUM 40 MG PO TBEC: DELAYED_RELEASE_TABLET | ORAL | 3 refills | Status: DC

## 2018-03-07 MED ORDER — PHENYLEPHRINE 40 MCG/ML (10ML) SYRINGE FOR IV PUSH (FOR BLOOD PRESSURE SUPPORT)
PREFILLED_SYRINGE | INTRAVENOUS | Status: DC | PRN
  Administered 2018-03-07: 120 ug via INTRAVENOUS
  Administered 2018-03-07: 80 ug via INTRAVENOUS
  Administered 2018-03-07: 200 ug via INTRAVENOUS

## 2018-03-07 MED ORDER — ONDANSETRON HCL 4 MG/2ML IJ SOLN
INTRAMUSCULAR | Status: DC | PRN
  Administered 2018-03-07: 4 mg via INTRAVENOUS

## 2018-03-07 MED ORDER — ASPIRIN 81 MG PO TABS: 81.0000 mg | ORAL_TABLET | Freq: Every day | ORAL | Status: DC

## 2018-03-07 MED ORDER — CLOPIDOGREL BISULFATE 75 MG PO TABS: 75.0000 mg | ORAL_TABLET | Freq: Every day | ORAL | 2 refills | Status: DC

## 2018-03-07 MED ORDER — FENTANYL CITRATE (PF) 250 MCG/5ML IJ SOLN
INTRAMUSCULAR | Status: DC | PRN
  Administered 2018-03-07: 100 ug via INTRAVENOUS

## 2018-03-07 MED ORDER — FENTANYL CITRATE (PF) 250 MCG/5ML IJ SOLN
INTRAMUSCULAR | Status: AC
Start: 2018-03-07 — End: ?
  Filled 2018-03-07: qty 5

## 2018-03-07 MED ORDER — MIDAZOLAM HCL 2 MG/2ML IJ SOLN
INTRAMUSCULAR | Status: AC
  Filled 2018-03-07: qty 2

## 2018-03-07 MED ORDER — LIDOCAINE HCL 2 % EX GEL
1.0000 "application " | Freq: Once | CUTANEOUS | Status: DC
  Filled 2018-03-07: qty 5

## 2018-03-07 MED ORDER — LACTATED RINGERS IV SOLN
INTRAVENOUS | Status: DC
  Administered 2018-03-07: 07:00:00 via INTRAVENOUS

## 2018-03-07 MED ORDER — LIDOCAINE 2% (20 MG/ML) 5 ML SYRINGE: INTRAMUSCULAR | Status: DC | PRN

## 2018-03-07 MED ORDER — DEXAMETHASONE SODIUM PHOSPHATE 10 MG/ML IJ SOLN
INTRAMUSCULAR | Status: DC | PRN
  Administered 2018-03-07: 4 mg via INTRAVENOUS

## 2018-03-07 SURGICAL SUPPLY — 39 items
ADAPTER BRONCH F/PENTAX (ADAPTER) ×3 IMPLANT
BRUSH CYTOL CELLEBRITY 1.5X140 (MISCELLANEOUS) ×3 IMPLANT
BRUSH SUPERTRAX BIOPSY (INSTRUMENTS) IMPLANT
BRUSH SUPERTRAX NDL-TIP CYTO (INSTRUMENTS) IMPLANT
CANISTER SUCT 3000ML PPV (MISCELLANEOUS) ×3 IMPLANT
CHANNEL WORK EXTEND EDGE 180 (KITS) IMPLANT
CHANNEL WORK EXTEND EDGE 45 (KITS) IMPLANT
CHANNEL WORK EXTEND EDGE 90 (KITS) IMPLANT
CONT SPEC 4OZ CLIKSEAL STRL BL (MISCELLANEOUS) ×3 IMPLANT
COVER BACK TABLE 60X90IN (DRAPES) ×3 IMPLANT
FILTER STRAW FLUID ASPIR (MISCELLANEOUS) IMPLANT
FORCEPS BIOP SUPERTRX PREMAR (INSTRUMENTS) ×3 IMPLANT
GAUZE SPONGE 4X4 12PLY STRL (GAUZE/BANDAGES/DRESSINGS) ×3 IMPLANT
GLOVE BIO SURGEON STRL SZ7.5 (GLOVE) ×6 IMPLANT
GOWN STRL REUS W/ TWL LRG LVL3 (GOWN DISPOSABLE) ×2 IMPLANT
GOWN STRL REUS W/TWL LRG LVL3 (GOWN DISPOSABLE) ×4
KIT CLEAN ENDO COMPLIANCE (KITS) ×3 IMPLANT
KIT LOCATABLE GUIDE (CANNULA) IMPLANT
KIT MARKER FIDUCIAL DELIVERY (KITS) IMPLANT
KIT PROCEDURE EDGE 180 (KITS) ×3 IMPLANT
KIT PROCEDURE EDGE 45 (KITS) IMPLANT
KIT PROCEDURE EDGE 90 (KITS) IMPLANT
KIT TURNOVER KIT B (KITS) ×3 IMPLANT
MARKER SKIN DUAL TIP RULER LAB (MISCELLANEOUS) ×3 IMPLANT
NEEDLE SUPERTRX PREMARK BIOPSY (NEEDLE) ×3 IMPLANT
NS IRRIG 1000ML POUR BTL (IV SOLUTION) ×3 IMPLANT
OIL SILICONE PENTAX (PARTS (SERVICE/REPAIRS)) ×3 IMPLANT
PAD ARMBOARD 7.5X6 YLW CONV (MISCELLANEOUS) ×6 IMPLANT
PATCHES PATIENT (LABEL) ×9 IMPLANT
SYR 20CC LL (SYRINGE) ×3 IMPLANT
SYR 20ML ECCENTRIC (SYRINGE) ×3 IMPLANT
SYR 50ML SLIP (SYRINGE) ×3 IMPLANT
TOWEL OR 17X24 6PK STRL BLUE (TOWEL DISPOSABLE) ×3 IMPLANT
TRAP SPECIMEN MUCOUS 40CC (MISCELLANEOUS) IMPLANT
TUBE CONNECTING 20'X1/4 (TUBING) ×1
TUBE CONNECTING 20X1/4 (TUBING) ×2 IMPLANT
UNDERPAD 30X30 (UNDERPADS AND DIAPERS) ×3 IMPLANT
VALVE DISPOSABLE (MISCELLANEOUS) ×3 IMPLANT
WATER STERILE IRR 1000ML POUR (IV SOLUTION) ×3 IMPLANT

## 2018-03-07 NOTE — Transfer of Care (Signed)
Immediate Anesthesia Transfer of Care Note  Patient: Ryan Contreras  Procedure(s) Performed: VIDEO BRONCHOSCOPY WITH ENDOBRONCHIAL NAVIGATION (Right )  Patient Location: PACU  Anesthesia Type:General  Level of Consciousness: awake, alert  and oriented  Airway & Oxygen Therapy: Patient Spontanous Breathing and Patient connected to nasal cannula oxygen  Post-op Assessment: Report given to RN and Post -op Vital signs reviewed and stable  Post vital signs: Reviewed and stable  Last Vitals:  Vitals Value Taken Time  BP 105/78 03/07/2018 10:37 AM  Temp    Pulse 97 03/07/2018 10:41 AM  Resp 21 03/07/2018 10:41 AM  SpO2 97 % 03/07/2018 10:41 AM  Vitals shown include unvalidated device data.  Last Pain:  Vitals:   03/07/18 0657  TempSrc:   PainSc: 0-No pain      Patients Stated Pain Goal: 4 (17/49/44 9675)  Complications: No apparent anesthesia complications

## 2018-03-07 NOTE — Op Note (Signed)
Video Bronchoscopy with Electromagnetic Navigation Procedure Note  Date of Operation: 03/07/2018  Pre-op Diagnosis: Right upper lobe nodule, left lower lobe nodule  Post-op Diagnosis: Same  Surgeon: Baltazar Apo  Assistants: None  Anesthesia: General endotracheal anesthesia  Operation: Flexible video fiberoptic bronchoscopy with electromagnetic navigation and biopsies.  Estimated Blood Loss: Minimal  Complications: None apparent  Indications and History: Ryan Contreras is a 77 y.o. male with history of tobacco use, COPD and previously treated squamous cell lung cancer.  He was noted to have right upper lobe nodule and a left lower lobe nodule that were both increasing in size on his surveillance CT scans of the chest.  The right upper lobe nodule was hypermetabolic on PET scan.  The recommendation was made for him to achieve a tissue diagnosis via navigational bronchoscopy with biopsies..  The risks, benefits, complications, treatment options and expected outcomes were discussed with the patient.  The possibilities of pneumothorax, pneumonia, reaction to medication, pulmonary aspiration, perforation of a viscus, bleeding, failure to diagnose a condition and creating a complication requiring transfusion or operation were discussed with the patient who freely signed the consent.    Description of Procedure: The patient was seen in the Preoperative Area, was examined and was deemed appropriate to proceed.  The patient was taken to OR 10, identified as Ryan Contreras and the procedure verified as Flexible Video Fiberoptic Bronchoscopy.  A Time Out was held and the above information confirmed.   Prior to the date of the procedure a high-resolution CT scan of the chest was performed. Utilizing [place] a virtual tracheobronchial tree was generated to allow the creation of distinct navigation pathways to the patient's parenchymal abnormalities. After being taken to the operating room  general anesthesia was initiated and the patient  was orally intubated. The video fiberoptic bronchoscope was introduced via the endotracheal tube and a general inspection was performed which showed some narrowing of the right upper lobe anterior and posterior segmental airways.  The right middle lobe airway was also somewhat [place] narrowed but the bronchoscope would easily pass and the more distal airways were normal in appearance.. The extendable working channel and locator guide were introduced into the bronchoscope.  At each location, including the right upper lobe airways (target 1) and the left lower lobe superior segmental airways (target 2), the airways were narrowed such that the working channel and instruments would not easily pass.  The distinct navigation pathways prepared prior to this procedure were then utilized to navigate to within 0.8 cm of patient's lesions identified on CT scan.  At each location the extendable working channel was secured into place and the locator guide was withdrawn. Under fluoroscopic guidance transbronchial needle brushings, transbronchial Wang needle biopsies, and transbronchial forceps biopsies were performed to be sent for cytology and pathology. A bronchioalveolar lavage was performed in the left upper lobe superior segment and sent for cytology and microbiology (bacterial, fungal, AFB smears and cultures). At the end of the procedure a general airway inspection was performed and there was no evidence of active bleeding. The bronchoscope was removed.  The patient tolerated the procedure well. There was no significant blood loss and there were no obvious complications. A post-procedural chest x-ray is pending.  Samples: 1. Transbronchial brushings from right upper lobe nodule 2. Transbronchial Wang needle biopsies from right upper lobe nodule 3. Transbronchial forceps biopsies from right upper lobe nodule 1. Transbronchial brushings from left lower lobe superior  segment 2. Transbronchial Wang needle biopsies from  left lower lobe superior segment 3. Transbronchial forceps biopsies from left lower lobe superior segment 4. Bronchoalveolar lavage from left lower lobe superior segment   Plans:  The patient will be discharged from the PACU to home when recovered from anesthesia and after chest x-ray is reviewed. We will review the cytology, pathology and microbiology results with the patient when they become available. Outpatient followup will be with Dr Lamonte Sakai and Dr Julien Nordmann.   Baltazar Apo, MD, PhD 03/07/2018, 10:28 AM [facility] [phone] or if no answer [phone]

## 2018-03-07 NOTE — Interval H&P Note (Signed)
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  He is followed by Dr. Julien Nordmann.  He is imaging that scarring for right upper lobe recurrence and also an enlarging mixed solid and sub-solid lesion in the left lower lobe.  We had arranged for navigational bronchoscopy to sample both areas given concern for possible malignancy.  Procedures been delayed twice, once because he failed to discontinue his Plavix and that again after he was admitted to [facility] for an acute exacerbation of COPD +/- pneumonia.  He reports today that he recovered from that hospitalization, was discharged approximately a week and half ago.  He denies any chest pain, significant cough, sputum production.  He does have stable but significant exertional dyspnea.  Vitals:   03/07/18 0629 03/07/18 0636  BP:  123/76  Pulse: 94   Resp: 18   Temp: 98.3 F (36.8 C)   TempSrc: Oral   SpO2: 98%   Weight: 88.5 kg (195 lb)   Height: 6\' 1"  (1.854 m)    Gen: Pleasant, somewhat chronically ill-appearing elderly gentleman, well-nourished, in no distress,  normal affect  ENT: Poor dentition, oropharynx clear  Neck: No JVD, no stridor  Lungs: No use of accessory muscles, very distant, no wheezing or crackles  Cardiovascular: RRR, heart sounds normal, no murmur or gallops, no peripheral edema  Abdomen: soft and NT, no HSM,  BS normal  Musculoskeletal: No deformities, no cyanosis or clubbing  Neuro: alert, non focal  Skin: Scarring on his upper chest   CT Chest 02/12/18 --   COMPARISON:  01/08/2018.  FINDINGS: Cardiovascular: Normal heart size. No pericardial effusion. Aortic atherosclerosis. Calcification within the RCA, LAD and left circumflex coronary artery is noted. No pericardial effusion identified.  Mediastinum/Nodes: The trachea appears patent and is midline.  Left paratracheal lymph node measures 8 mm, image 64/2. Previously 5 mm.  Lungs/Pleura: No pleural effusion. Moderate changes of emphysema. Diffuse bronchial wall thickening noted. Right upper lobe perihilar lung mass measures 3.2 by 2.0 by 1.8 cm (volume = 6 cm^3), image 64/3. Previously 2.9 x 1.7 x 1.8 cm. (Volume = 6 cm^3) Increased soft tissue surrounding the right upper lobe bronchus is again noted. Mild postobstructive pneumonitis within the right upper lobe is similar to previous exam.  Sub solid lesion within the medial left lower lobe measures 2.3 by 1.5 cm, image 74/series 3. Unchanged from previous exam.  Within the posterior right lower lobe there are new scattered tree-in-bud nodules, likely secondary to inflammatory or infectious bronchiolitis.  Upper Abdomen: No acute findings within the upper abdomen. Intermediate attenuating lesion adjacent to the posterior right lobe is unchanged from previous exam measuring 4.5 by 3.5 cm, image 147/2. Likely benign proteinaceous cyst. No acute abnormality identified within the upper abdomen.  Musculoskeletal: No aggressive lytic or sclerotic bone lesions identified.  IMPRESSION: 1. The right upper lobe perihilar lung mass is similar in size and appearance compared with 12/18/2017. As mentioned previously this is worrisome for tumor recurrence. 2. Stable appearance of sub solid lesion within the medial left lower lobe which is worrisome for primary bronchogenic adenocarcinoma. 3. Aortic atherosclerosis and 3 vessel coronary artery calcification. 4. Emphysema (ICD10-J43.9).  Impression / Plans:  Right upper lobe solid nodular lesion is hypermetabolic on PET scan, consistent with probable recurrence of his non-small cell lung cancer.  He also has a less well-formed left lower  lobe lesion of unclear etiology.  He presents today for navigational bronchoscopy.  He has appropriately stopped his Plavix and his aspirin.  He  appears to be overall clinically stable although at baseline he has significant emphysema, COPD, dyspnea.  I explained to him that he is at some risk for prolonged ventilation or extended hospitalization post procedure due to his underlying obstructive lung disease.  He understands.  All questions answered.  He elects to proceed.  We will review the results with him and with Dr. Julien Nordmann when they become available.  Baltazar Apo, MD, PhD 03/07/2018, 8:22 AM [facility] [phone] or if no answer [phone]

## 2018-03-07 NOTE — Anesthesia Procedure Notes (Signed)
Procedure Name: Intubation Date/Time: 03/07/2018 8:27 AM Performed by: Wilburn Cornelia, CRNA Pre-anesthesia Checklist: Patient identified, Emergency Drugs available, Suction available, Patient being monitored and Timeout performed Patient Re-evaluated:Patient Re-evaluated prior to induction Oxygen Delivery Method: Circle system utilized Preoxygenation: Pre-oxygenation with 100% oxygen Induction Type: IV induction Ventilation: Mask ventilation without difficulty and Oral airway inserted - appropriate to patient size Laryngoscope Size: Mac and 4 Grade View: Grade II Tube type: Oral Tube size: 8.5 mm Airway Equipment and Method: Stylet Placement Confirmation: ETT inserted through vocal cords under direct vision,  positive ETCO2,  CO2 detector and breath sounds checked- equal and bilateral Secured at: 23 cm Tube secured with: Tape Dental Injury: Teeth and Oropharynx as per pre-operative assessment

## 2018-03-07 NOTE — Anesthesia Postprocedure Evaluation (Signed)
Anesthesia Post Note  Patient: Paulla Fore Kuenzel  Procedure(s) Performed: VIDEO BRONCHOSCOPY WITH ENDOBRONCHIAL NAVIGATION (Right )     Patient location during evaluation: PACU Anesthesia Type: General Level of consciousness: awake and alert Pain management: pain level controlled Vital Signs Assessment: post-procedure vital signs reviewed and stable Respiratory status: spontaneous breathing, nonlabored ventilation, respiratory function stable and patient connected to nasal cannula oxygen Cardiovascular status: blood pressure returned to baseline and stable Postop Assessment: no apparent nausea or vomiting Anesthetic complications: no    Last Vitals:  Vitals:   03/07/18 1105 03/07/18 1115  BP: 114/64 (!) 119/59  Pulse: 92 91  Resp: 17 16  Temp: 37 C   SpO2: 98% 98%    Last Pain:  Vitals:   03/07/18 1115  TempSrc:   PainSc: 0-No pain                 Silva Aamodt,W. EDMOND

## 2018-03-07 NOTE — Discharge Instructions (Signed)
Flexible Bronchoscopy, Care After These instructions give you information on caring for yourself after your procedure. Your doctor may also give you more specific instructions. Call your doctor if you have any problems or questions after your procedure. Follow these instructions at home:  Do not eat or drink anything for 2 hours after your procedure. If you try to eat or drink before the medicine wears off, food or drink could go into your lungs. You could also burn yourself.  After 2 hours have passed and when you can cough and gag normally, you may eat soft food and drink liquids slowly.  The day after the test, you may eat your normal diet.  You may do your normal activities.  Keep all doctor visits. Get help right away if:  You get more and more short of breath.  You get light-headed.  You feel like you are going to pass out (faint).  You have chest pain.  You have new problems that worry you.  You cough up more than a little blood.  You cough up more blood than before.   You may restart your aspirin and Plavix on 03/09/18 (Friday).   Please call our office for any questions or concerns.  [phone]  This information is not intended to replace advice given to you by your health care provider. Make sure you discuss any questions you have with your health care provider. Document Released: 09/25/2009 Document Revised: 05/05/2016 Document Reviewed: 08/02/2013 Elsevier Interactive Patient Education  2017 [employer].

## 2018-03-08 ENCOUNTER — Other Ambulatory Visit: Payer: Self-pay | Admitting: Urology

## 2018-03-08 ENCOUNTER — Encounter (HOSPITAL_COMMUNITY): Payer: Self-pay | Admitting: Emergency Medicine

## 2018-03-08 DIAGNOSIS — R972 Elevated prostate specific antigen [PSA]: Secondary | ICD-10-CM

## 2018-03-09 ENCOUNTER — Telehealth: Payer: Self-pay | Admitting: Emergency Medicine

## 2018-03-09 LAB — CULTURE, RESPIRATORY W GRAM STAIN

## 2018-03-09 LAB — CULTURE, RESPIRATORY

## 2018-03-09 NOTE — Telephone Encounter (Signed)
Discussed the patient's pathology results with pathology department.  His right upper lobe nodule was consistent with squamous cell carcinoma.  I asked them to send PDL 1 testing on this.  The left lower lobe transbronchial biopsies on the more groundglass lesion were benign, no evidence for malignancy.  This will likely just be followed with serial imaging for now.  He has follow-up visit with Dr. Julien Nordmann on 03/13/18.  I will forward this information to him in preparation for that visit.

## 2018-03-13 ENCOUNTER — Encounter: Payer: Self-pay | Admitting: Internal Medicine

## 2018-03-13 ENCOUNTER — Other Ambulatory Visit: Payer: Self-pay | Admitting: Medical Oncology

## 2018-03-13 ENCOUNTER — Inpatient Hospital Stay (HOSPITAL_BASED_OUTPATIENT_CLINIC_OR_DEPARTMENT_OTHER): Payer: Medicare Other | Admitting: Internal Medicine

## 2018-03-13 ENCOUNTER — Inpatient Hospital Stay: Payer: Medicare Other | Attending: Internal Medicine

## 2018-03-13 ENCOUNTER — Telehealth: Payer: Self-pay | Admitting: Internal Medicine

## 2018-03-13 VITALS — BP 151/54 | HR 95 | Temp 98.6°F | Resp 18 | Ht 73.0 in | Wt 196.5 lb

## 2018-03-13 DIAGNOSIS — Z7982 Long term (current) use of aspirin: Secondary | ICD-10-CM | POA: Insufficient documentation

## 2018-03-13 DIAGNOSIS — I10 Essential (primary) hypertension: Secondary | ICD-10-CM | POA: Insufficient documentation

## 2018-03-13 DIAGNOSIS — E785 Hyperlipidemia, unspecified: Secondary | ICD-10-CM | POA: Diagnosis not present

## 2018-03-13 DIAGNOSIS — C3491 Malignant neoplasm of unspecified part of right bronchus or lung: Secondary | ICD-10-CM

## 2018-03-13 DIAGNOSIS — R0609 Other forms of dyspnea: Secondary | ICD-10-CM | POA: Diagnosis not present

## 2018-03-13 DIAGNOSIS — I2510 Atherosclerotic heart disease of native coronary artery without angina pectoris: Secondary | ICD-10-CM | POA: Insufficient documentation

## 2018-03-13 DIAGNOSIS — Z923 Personal history of irradiation: Secondary | ICD-10-CM | POA: Diagnosis not present

## 2018-03-13 DIAGNOSIS — E119 Type 2 diabetes mellitus without complications: Secondary | ICD-10-CM | POA: Diagnosis not present

## 2018-03-13 DIAGNOSIS — Z79899 Other long term (current) drug therapy: Secondary | ICD-10-CM | POA: Insufficient documentation

## 2018-03-13 DIAGNOSIS — C3490 Malignant neoplasm of unspecified part of unspecified bronchus or lung: Secondary | ICD-10-CM

## 2018-03-13 DIAGNOSIS — Z9221 Personal history of antineoplastic chemotherapy: Secondary | ICD-10-CM | POA: Insufficient documentation

## 2018-03-13 DIAGNOSIS — F17210 Nicotine dependence, cigarettes, uncomplicated: Secondary | ICD-10-CM | POA: Insufficient documentation

## 2018-03-13 DIAGNOSIS — R5383 Other fatigue: Secondary | ICD-10-CM | POA: Diagnosis not present

## 2018-03-13 DIAGNOSIS — Z8711 Personal history of peptic ulcer disease: Secondary | ICD-10-CM | POA: Insufficient documentation

## 2018-03-13 DIAGNOSIS — J449 Chronic obstructive pulmonary disease, unspecified: Secondary | ICD-10-CM | POA: Insufficient documentation

## 2018-03-13 DIAGNOSIS — J438 Other emphysema: Secondary | ICD-10-CM

## 2018-03-13 DIAGNOSIS — Z85118 Personal history of other malignant neoplasm of bronchus and lung: Secondary | ICD-10-CM | POA: Diagnosis not present

## 2018-03-13 DIAGNOSIS — Z9981 Dependence on supplemental oxygen: Secondary | ICD-10-CM | POA: Insufficient documentation

## 2018-03-13 LAB — CBC WITH DIFFERENTIAL (CANCER CENTER ONLY)
BASOS ABS: 0 10*3/uL (ref 0.0–0.1)
BASOS PCT: 0 %
EOS ABS: 0.3 10*3/uL (ref 0.0–0.5)
EOS PCT: 4 %
HCT: 36 % — ABNORMAL LOW (ref 38.4–49.9)
HEMOGLOBIN: 11.6 g/dL — AB (ref 13.0–17.1)
Lymphocytes Relative: 29 %
Lymphs Abs: 2.6 10*3/uL (ref 0.9–3.3)
MCH: 31.5 pg (ref 27.2–33.4)
MCHC: 32.2 g/dL (ref 32.0–36.0)
MCV: 97.8 fL (ref 79.3–98.0)
Monocytes Absolute: 1.1 10*3/uL — ABNORMAL HIGH (ref 0.1–0.9)
Monocytes Relative: 12 %
NEUTROS PCT: 55 %
Neutro Abs: 4.9 10*3/uL (ref 1.5–6.5)
PLATELETS: 254 10*3/uL (ref 140–400)
RBC: 3.68 MIL/uL — AB (ref 4.20–5.82)
RDW: 15.9 % — ABNORMAL HIGH (ref 11.0–14.6)
WBC: 9 10*3/uL (ref 4.0–10.3)

## 2018-03-13 LAB — CMP (CANCER CENTER ONLY)
ALK PHOS: 88 U/L (ref 40–150)
ALT: 18 U/L (ref 0–55)
ANION GAP: 8 (ref 3–11)
AST: 21 U/L (ref 5–34)
Albumin: 3.2 g/dL — ABNORMAL LOW (ref 3.5–5.0)
BUN: 9 mg/dL (ref 7–26)
CALCIUM: 9.2 mg/dL (ref 8.4–10.4)
CHLORIDE: 99 mmol/L (ref 98–109)
CO2: 32 mmol/L — AB (ref 22–29)
Creatinine: 0.77 mg/dL (ref 0.70–1.30)
Glucose, Bld: 122 mg/dL (ref 70–140)
Potassium: 4.3 mmol/L (ref 3.5–5.1)
SODIUM: 139 mmol/L (ref 136–145)
Total Bilirubin: 0.5 mg/dL (ref 0.2–1.2)
Total Protein: 7.5 g/dL (ref 6.4–8.3)

## 2018-03-13 NOTE — Progress Notes (Signed)
[facility] [phone]   [phone]  OFFICE PROGRESS NOTE  Renato Shin, MD [address] [place] [address]  DIAGNOSIS: Recurrent non-small cell lung cancer, squamous cell carcinoma initially presented as unresectable Stage IIA (T1b, N1, M0) non-small cell lung cancer, squamous cell carcinoma presented with right upper lobe lung nodule and hilar lymphadenopathy diagnosed in January 2017.  PRIOR THERAPY: Course of concurrent chemoradiation with weekly carboplatin for AUC of 2 and paclitaxel 45 MG/M2. Status post 7 cycles, last dose was given 03/14/2016 with partial response.  CURRENT THERAPY: Observation.  INTERVAL HISTORY: Ryan Contreras 77 y.o. male returns to the clinic today for follow-up visit accompanied by his wife.  The patient is feeling fine today with no specific complaints except for mild fatigue and shortness of breath with exertion.  He denied having any current chest pain, cough or hemoptysis.  He denied having any fever or chills.  He has no nausea, vomiting, diarrhea or constipation.  He was seen recently by Dr. Lamonte Sakai and had bronchoscopy with biopsy of the right upper lobe lung nodule as well as the left lower lobe lesion.  The final pathology was consistent with non-small cell carcinoma, squamous cell carcinoma in the right upper lobe.  The patient is here today for evaluation and discussion of his treatment options.   MEDICAL HISTORY: Past Medical History:  Diagnosis Date  . Arthritis   . CAD, NATIVE VESSEL    coronary stent x1-tx. Plavix use.  Marland Kitchen COPD (chronic obstructive pulmonary disease) ([place])    due to 50+ years Smoking.  Marland Kitchen Dyspnea    wears oxygen PRN  . Gastrointestinal bleeding, upper 04/29/2016  . Gout, unspecified    foot  . HYPERLIPIDEMIA   . HYPERTENSION   . Lung mass   . NSCL ca dx'd 12/2015  . On supplemental oxygen by nasal cannula    PRN  . PEPTIC ULCER DISEASE   . Pneumonia   . TOBACCO  ABUSE   . Type 2 diabetes mellitus ([place])    denies on 01-06-16    ALLERGIES:  has No Known Allergies.  MEDICATIONS:  Current Outpatient Medications  Medication Sig Dispense Refill  . acetaminophen (TYLENOL) 500 MG tablet Take 250 mg by mouth every 6 (six) hours as needed for moderate pain or headache.     . allopurinol (ZYLOPRIM) 100 MG tablet TAKE 1 TABLET BY MOUTH EVERY DAY (Patient taking differently: TAKE 100 MG BY MOUTH EVERY DAY) 90 tablet 3  . aspirin 81 MG tablet Take 1 tablet (81 mg total) by mouth at bedtime. Restart on 03/09/18 30 tablet   . atorvastatin (LIPITOR) 80 MG tablet TAKE 1 TABLET BY MOUTH EVERY DAY (Patient taking differently: TAKE 80 MG BY MOUTH EVERY DAY) 30 tablet 2  . budesonide-formoterol (SYMBICORT) 160-4.5 MCG/ACT inhaler Inhale 2 puffs into the lungs 2 (two) times daily. 1 Inhaler 0  . clopidogrel (PLAVIX) 75 MG tablet Take 1 tablet (75 mg total) by mouth daily. Please restart on 03/09/18 30 tablet 2  . docusate sodium (COLACE) 100 MG capsule Take 100 mg by mouth at bedtime.    . donepezil (ARICEPT) 10 MG tablet Take 1 tablet (10 mg total) by mouth at bedtime. (Patient not taking: Reported on 02/27/2018) 30 tablet 11  . eplerenone (INSPRA) 25 MG tablet Take 1 tablet (25 mg total) by mouth daily. 30 tablet 5  . latanoprost (XALATAN) 0.005 % ophthalmic solution Place 1 drop into  both eyes at bedtime.  6  . losartan-hydrochlorothiazide (HYZAAR) 100-12.5 MG tablet TAKE 1 TABLET BY MOUTH DAILY. 90 tablet 2  . oxybutynin (DITROPAN) 5 MG tablet Take 5 mg by mouth at bedtime.  11  . pantoprazole (PROTONIX) 40 MG tablet TAKE 40 MG BY MOUTH EVERY DAY 90 tablet 3  . potassium chloride SA (KLOR-CON M20) 20 MEQ tablet Take 1 tablet (20 mEq total) by mouth daily. 30 tablet 11  . PROAIR HFA 108 (90 Base) MCG/ACT inhaler Inhale 2 puffs into the lungs every 6 (six) hours as needed for wheezing or shortness of breath.  0  . SYMBICORT 160-4.5 MCG/ACT inhaler INHALE 2 PUFFS BY MOUTH  INTO THE LUNGS 2 (TWO) TIMES DAILY. 10.2 Inhaler 3   No current facility-administered medications for this visit.     SURGICAL HISTORY:  Past Surgical History:  Procedure Laterality Date  . CARDIAC CATHETERIZATION     '06-stent placed, 6'13 - Dr. Lizbeth Bark follows  . CORONARY STENT PLACEMENT     s/p  . ENDOBRONCHIAL ULTRASOUND Bilateral 01/11/2016   Procedure: ENDOBRONCHIAL ULTRASOUND;  Surgeon: Collene Gobble, MD;  Location: [facility];  Service: Endoscopy;  Laterality: Bilateral;  . VIDEO BRONCHOSCOPY WITH ENDOBRONCHIAL NAVIGATION Right 03/07/2018   Procedure: VIDEO BRONCHOSCOPY WITH ENDOBRONCHIAL NAVIGATION;  Surgeon: Collene Gobble, MD;  Location: [facility] OR;  Service: Thoracic;  Laterality: Right;    REVIEW OF SYSTEMS:  Constitutional: positive for fatigue Eyes: negative Ears, nose, mouth, throat, and face: negative Respiratory: positive for dyspnea on exertion Cardiovascular: negative Gastrointestinal: negative Genitourinary:negative Integument/breast: negative Hematologic/lymphatic: negative Musculoskeletal:negative Neurological: negative Behavioral/Psych: negative Endocrine: negative Allergic/Immunologic: negative   PHYSICAL EXAMINATION: General appearance: alert, cooperative, fatigued and no distress Head: Normocephalic, without obvious abnormality, atraumatic Neck: no adenopathy, no JVD, supple, symmetrical, trachea midline and thyroid not enlarged, symmetric, no tenderness/mass/nodules Lymph nodes: Cervical, supraclavicular, and axillary nodes normal. Resp: clear to auscultation bilaterally Back: symmetric, no curvature. ROM normal. No CVA tenderness. Cardio: regular rate and rhythm, S1, S2 normal, no murmur, click, rub or gallop GI: soft, non-tender; bowel sounds normal; no masses,  no organomegaly Extremities: extremities normal, atraumatic, no cyanosis or edema Neurologic: Alert and oriented X 3, normal strength and tone. Normal symmetric reflexes. Normal  coordination and gait  ECOG PERFORMANCE STATUS: 1 - Symptomatic but completely ambulatory  Blood pressure (!) 151/54, pulse 95, temperature 98.6 F (37 C), temperature source Oral, resp. rate 18, height 6\' 1"  (1.854 m), weight 196 lb 8 oz (89.1 kg), SpO2 95 %.  LABORATORY DATA: Lab Results  Component Value Date   WBC 9.0 03/13/2018   HGB 11.6 (L) 03/07/2018   HCT 36.0 (L) 03/13/2018   MCV 97.8 03/13/2018   PLT 254 03/13/2018      Chemistry      Component Value Date/Time   NA 136 03/07/2018 0651   NA 141 06/16/2017 0906   K 4.3 03/07/2018 0651   K 3.4 (L) 06/16/2017 0906   CL 98 (L) 03/07/2018 0651   CO2 26 03/07/2018 0651   CO2 35 (H) 06/16/2017 0906   BUN 11 03/07/2018 0651   BUN 10.3 06/16/2017 0906   CREATININE 0.76 03/07/2018 0651   CREATININE 0.8 06/16/2017 0906      Component Value Date/Time   CALCIUM 8.7 (L) 03/07/2018 0651   CALCIUM 9.3 06/16/2017 0906   ALKPHOS 102 12/18/2017 0811   ALKPHOS 87 06/16/2017 0906   AST 22 12/18/2017 0811   AST 21 06/16/2017 0906   ALT 19 12/18/2017  0811   ALT 15 06/16/2017 0906   BILITOT 0.5 12/18/2017 0811   BILITOT 0.46 06/16/2017 1610       RADIOGRAPHIC STUDIES: Dg Chest Port 1 View  Result Date: 03/07/2018 CLINICAL DATA:  S/p bronchoscopy with biopsy. EXAM: PORTABLE CHEST 1 VIEW COMPARISON:  Chest x-ray dated 02/14/2018. Chest CT dated 02/12/2018. FINDINGS: Right perihilar opacity is stable, compatible with the perihilar mass demonstrated on earlier chest CT. Left perihilar region is also stable in the short-term interval, likely accentuated by the sub solid lesion demonstrated in the medial left lower lobe on earlier chest CT. No new lung findings. No pleural effusion or pneumothorax seen. Heart size and mediastinal contours are stable. IMPRESSION: Stable chest x-ray, as detailed above. No pleural effusion, pneumothorax or other postprocedural complicating features. Electronically Signed   By: Franki Cabot M.D.   On:  03/07/2018 11:06   Ct Super D Chest Wo Contrast  Result Date: 02/12/2018 CLINICAL DATA:  Super D protocol.  Evaluate lung mass. EXAM: CT CHEST WITHOUT CONTRAST TECHNIQUE: Multidetector CT imaging of the chest was performed using thin slice collimation for electromagnetic bronchoscopy planning purposes, without intravenous contrast. COMPARISON:  01/08/2018. FINDINGS: Cardiovascular: Normal heart size. No pericardial effusion. Aortic atherosclerosis. Calcification within the RCA, LAD and left circumflex coronary artery is noted. No pericardial effusion identified. Mediastinum/Nodes: The trachea appears patent and is midline. Left paratracheal lymph node measures 8 mm, image 64/2. Previously 5 mm. Lungs/Pleura: No pleural effusion. Moderate changes of emphysema. Diffuse bronchial wall thickening noted. Right upper lobe perihilar lung mass measures 3.2 by 2.0 by 1.8 cm (volume = 6 cm^3), image 64/3. Previously 2.9 x 1.7 x 1.8 cm. (Volume = 6 cm^3) Increased soft tissue surrounding the right upper lobe bronchus is again noted. Mild postobstructive pneumonitis within the right upper lobe is similar to previous exam. Sub solid lesion within the medial left lower lobe measures 2.3 by 1.5 cm, image 74/series 3. Unchanged from previous exam. Within the posterior right lower lobe there are new scattered tree-in-bud nodules, likely secondary to inflammatory or infectious bronchiolitis. Upper Abdomen: No acute findings within the upper abdomen. Intermediate attenuating lesion adjacent to the posterior right lobe is unchanged from previous exam measuring 4.5 by 3.5 cm, image 147/2. Likely benign proteinaceous cyst. No acute abnormality identified within the upper abdomen. Musculoskeletal: No aggressive lytic or sclerotic bone lesions identified. IMPRESSION: 1. The right upper lobe perihilar lung mass is similar in size and appearance compared with 12/18/2017. As mentioned previously this is worrisome for tumor recurrence. 2.  Stable appearance of sub solid lesion within the medial left lower lobe which is worrisome for primary bronchogenic adenocarcinoma. 3. Aortic atherosclerosis and 3 vessel coronary artery calcification. 4. Emphysema (ICD10-J43.9). Electronically Signed   By: Kerby Moors M.D.   On: 02/12/2018 13:29   Dg C-arm Bronchoscopy  Result Date: 03/07/2018 C-ARM BRONCHOSCOPY: Fluoroscopy was utilized by the requesting physician.  No radiographic interpretation.    ASSESSMENT AND PLAN:  This is a very pleasant 77 years old African-American male with recurrent non-small cell lung cancer, squamous cell carcinoma presented initially as unresectable a stage IIA non-small cell lung cancer, squamous cell carcinoma status post a course of concurrent chemoradiation.  The patient has been in observation since April 2017. Repeat CT scan of the chest showed enlargement of right upper lobe lung nodule suspicious for disease recurrence. A PET scan performed recently showed findings consistent with lung cancer recurrence with in the central right upper lobe as well as new  ground glass nodule in the left lower lobe with low but measurable metabolic activity concerning for low-grade adenocarcinoma. The patient underwent repeat bronchoscopy and biopsy of the right upper lobe was consistent with squamous cell carcinoma.  The left lower lobe biopsy showed malignant cells. I had a lengthy discussion with the patient and his wife about his current condition and treatment options.  I gave the patient the option of consideration of stereotactic body radiotherapy to the bilateral lesions by radiation oncology versus consideration of systemic chemotherapy with carboplatin, paclitaxel and Keytruda versus palliative care. The patient is interested in proceeding with the radiotherapy.  I would refer him to radiation oncology for evaluation and discussion of this option.  He was treated in the past by Dr. Orlene Erm at [facility]  but I am not sure if the facility there has the capability for treatment with SBRT. I would see the patient back for follow-up visit in 4 months for evaluation after repeating CT scan of the chest for restaging of his disease after the radiotherapy. He was advised to call immediately if he has any concerning symptoms in the interval.  The patient voices understanding of current disease status and treatment options and is in agreement with the current care plan. All questions were answered. The patient knows to call the clinic with any problems, questions or concerns. We can certainly see the patient much sooner if necessary.  Disclaimer: This note was dictated with voice recognition software. Similar sounding words can inadvertently be transcribed and may be missed upon review.

## 2018-03-13 NOTE — Telephone Encounter (Signed)
Ointments scheduled AVS/Calendar printed per 4/2 los

## 2018-03-15 ENCOUNTER — Encounter: Payer: Self-pay | Admitting: Emergency Medicine

## 2018-03-15 ENCOUNTER — Ambulatory Visit (INDEPENDENT_AMBULATORY_CARE_PROVIDER_SITE_OTHER): Payer: Medicare Other | Admitting: Emergency Medicine

## 2018-03-15 DIAGNOSIS — J449 Chronic obstructive pulmonary disease, unspecified: Secondary | ICD-10-CM

## 2018-03-15 DIAGNOSIS — C3490 Malignant neoplasm of unspecified part of unspecified bronchus or lung: Secondary | ICD-10-CM

## 2018-03-15 MED ORDER — TIOTROPIUM BROMIDE-OLODATEROL 2.5-2.5 MCG/ACT IN AERS: 2.0000 | INHALATION_SPRAY | Freq: Every day | RESPIRATORY_TRACT | 5 refills | Status: DC

## 2018-03-15 NOTE — Patient Instructions (Addendum)
Please temporarily stop Symbicort. We will try Stiolto for 1 month to see if you prefer this medication.  If it helps her breathing more than we will replace Symbicort permanently with this.  Please call our office to let us know if you prefer the [place]. Keep albuterol available to use 2 puffs if needed for shortness of breath, wheezing, chest tightness. Follow with Dr. Julien Nordmann and with radiation oncology regarding initiation of treatment for your recurrent lung cancer. Follow with Dr Lamonte Sakai in 3 months or sooner if you have any problems.

## 2018-03-15 NOTE — Progress Notes (Signed)
Subjective:    Patient ID: Ryan Contreras, male    DOB: [birth year], 77 y.o.   MRN: [id number]  HPI  77 year old gentleman, former smoker, COPD, whom I have met before in 2017.  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  He underwent concurrent chemoradiation and was followed by Dr. Julien Nordmann.  He has been followed with serial imaging.  He had a CT scan on 12/18/17 that I reviewed that showed possible local recurrence in the central aspect of the right upper lobe with an enlarging nodule.  Also noted was enlarging mixed solid and sub-solid lesion in the superior segment of the left lower lobe.  PET scan was done on 01/08/18 that I reviewed and which confirmed hypermetabolism of the right upper lobe nodule, low metabolic activity (SUV max 2.7) in the left lower lobe mixed solid/groundglass lesion.  There was no evidence of mediastinal lymphadenopathy or hypermetabolism.  Also noted with some hypermetabolism around the prostate.  A PSA done on 12/27/17 was elevated at 13.9.               ROV 03/15/18 --patient returns today for continued evaluation of his COPD, recurrence of his non-small cell lung cancer, squamous cell.  He underwent navigational bronchoscopy since our last visit to evaluate an enlarging right perihilar nodule as well as a less well-formed left lower lobe nodular infiltrate.  The right upper lobe nodule was consistent with squamous cell lung cancer.  The left-sided biopsies were negative but the brushing did show malignant cells.  He saw Dr. Julien Nordmann on 03/13/18 to discuss options for therapy and has decided to pursue possible stereotactic XRT.  The other option would be chemotherapy in addition to [facility].  He restarted his Plavix without incident.  He reports that he has felt well, still gets exertional SOB when walking 50-75 ft. Has to stop when doing housework. He coughs daily, clear mucous. '' He is currently on  Symbicort, uses albuterol approximately about 2x a day.  He has o2 that he uses at night                  Review of Systems     Objective:   Physical Exam Vitals:   03/15/18 0911  BP: 120/70  Pulse: (!) 107  SpO2: 96%  Weight: 199 lb (90.3 kg)  Height: '6\' 1"'  (1.854 m)   Gen: Pleasant, well-nourished, in no distress,  normal affect  ENT: No lesions,  mouth clear,  oropharynx clear, no postnasal drip, poor dentition  Neck: No JVD, no stridor  Lungs: No use of accessory muscles, distant, no wheezing  Cardiovascular: RRR, heart sounds normal, no murmur or gallops, trace peripheral edema  Musculoskeletal: No deformities, no cyanosis or clubbing  Neuro: alert, non focal  Skin: Warm, no lesions or rash     Assessment & Plan:  COPD  GOLD C Managed on Symbicort.  He has severe obstruction and has persistent exertional symptoms.  I like to try him on an alternative if I can find a good options covered by his insurance.  He should be able to start Stiolto based on his formulary. We will prescribe (I do not have samples right now).  Try it for 1 month and if he feels that he benefits then we will replace Symbicort with the Stiolto long-term.  Continue albuterol as he needs it.  I encouraged him to use his  oxygen with exertion more reliably  Non-small cell carcinoma of lung, stage 2 (HCC) Recurrent disease, now stage IV with right upper lobe and left lower lobe disease.  He is planning for SBRT.  Following with and being organized by Dr. Glenice Laine, MD, PhD 03/15/2018, 9:30 AM [facility] [phone] or if no answer [phone]

## 2018-03-15 NOTE — Assessment & Plan Note (Signed)
Managed on Symbicort.  He has severe obstruction and has persistent exertional symptoms.  I like to try him on an alternative if I can find a good options covered by his insurance.  He should be able to start Stiolto based on his formulary. We will prescribe (I do not have samples right now).  Try it for 1 month and if he feels that he benefits then we will replace Symbicort with the Stiolto long-term.  Continue albuterol as he needs it.  I encouraged him to use his oxygen with exertion more reliably

## 2018-03-15 NOTE — Assessment & Plan Note (Addendum)
Recurrent disease, now stage IV with right upper lobe and left lower lobe disease.  He is planning for SBRT.  Following with and being organized by Dr. Julien Nordmann

## 2018-03-19 NOTE — Progress Notes (Signed)
Thoracic Location of Tumor / Histology: Non-small cell carcinoma of right lung, stage 2.  Recurrent non-small cell lung cancer, squamous cell carcinoma stage 4 RUL, LLL  Patient presented with right upper lobe lung nodule and hilar lymphadenopathy diagnosed in January 2017.  Complains of mild fatigue and shortness of breath with exertion.    CT Chest 02/12/2018: Enlargement of the right upper lobe lung nodule suspicious for disease recurrence.  PET 01/08/2018: Findings consistent with lung cancer recurrence within the central right upper lobe as well as new ground glass nodule in the left lower lobe with low but measurable metabolic activity concerning for low-grade adenocarcinoma.   Biopsies of Right Upper Lobe, Left Lower Lobe 03/07/2018   Tobacco/Marijuana/Snuff/ETOH use: Former smoker  Past/Anticipated interventions by cardiothoracic surgery, if any:   Dr. Lamonte Sakai 03/15/2018 Bronchoscopy with biopsy of the right upper lobe lung nodule as well as the left lower lobe lesion.  Managed on Symbicort.  He has severe obstruction and has persistent exertional symptoms.  I like to try him on an alternative if I can find a good options covered by his insurance.  He should be able to start Stiolto based on his formulary. We will prescribe (I do not have samples right now).  Try it for 1 month and if he feels that he benefits then we will replace Symbicort with the Stiolto long-term.  Continue albuterol as he needs it.  I encouraged him to use his oxygen with exertion more reliably.  Past/Anticipated interventions by medical oncology, if any:  Dr. Julien Nordmann 03/13/2018 Prior Therapy- Course of concurrent chemoradiation with weekly carboplatin for AUC of 2 and paclitaxel 45 MG/M2.  Status post 7 cycles, last dose given 03/14/2016 with partial response. Current Plan  I gave the patient the option of consideration of stereotactic body radiotherapy to the bilateral lesions by radiation oncology versus consideration of  systemic chemotherapy with carboplatin, paclitaxel and Keytruda versus palliative care.  The patient is interested in proceeding with the radiotherapy.  I would refer him to radiation oncology for evaluation and discussion of this option.  He was treated in the past by Dr. Palermo/Dr. Pablo Ledger at [facility] but I am not sure if the facility there has the capability for treatment with SBRT. I would see the patient back for follow-up visit in 4 months for evaluation after repeating CT scan of the chest for restaging of his disease after the radiotherapy.   Signs/Symptoms  Weight changes, if any: No  Respiratory complaints, if any: Shortness of breath with exertion.  Hemoptysis, if any: Productive cough last week with clear/bloody mucus that has cleared up.  Pain issues, if any: no  BP 118/63 (BP Location: Right Arm, Patient Position: Sitting, Cuff Size: Normal)   Pulse (!) 107   Temp 98.3 F (36.8 C) (Oral)   Resp 20   Ht 6\' 1"  (1.854 m)   Wt 201 lb (91.2 kg)   SpO2 91%   BMI 26.52 kg/m    Wt Readings from Last 3 Encounters:  03/20/18 201 lb (91.2 kg)  03/15/18 199 lb (90.3 kg)  03/13/18 196 lb 8 oz (89.1 kg)    SAFETY ISSUES:  Prior radiation? Yes, Dr. Pablo Ledger at [facility] 01/21/2016  Pacemaker/ICD?  No, does have a cardiac stent.  Possible current pregnancy? No  Is the patient on methotrexate? No  Current Complaints / other details:  Ready to have a plan so he can get back to his regular life.

## 2018-03-20 ENCOUNTER — Ambulatory Visit
Admission: RE | Admit: 2018-03-20 | Discharge: 2018-03-20 | Disposition: A | Discharge status: Home / Self Care | Payer: Medicare Other | Source: Ambulatory Visit | Attending: Radiation Oncology | Admitting: Radiation Oncology

## 2018-03-20 ENCOUNTER — Other Ambulatory Visit: Payer: Self-pay

## 2018-03-20 ENCOUNTER — Encounter: Payer: Self-pay | Admitting: Radiation Oncology

## 2018-03-20 VITALS — BP 118/63 | HR 107 | Temp 98.3°F | Resp 20 | Ht 73.0 in | Wt 201.0 lb

## 2018-03-20 DIAGNOSIS — E119 Type 2 diabetes mellitus without complications: Secondary | ICD-10-CM | POA: Diagnosis not present

## 2018-03-20 DIAGNOSIS — I2510 Atherosclerotic heart disease of native coronary artery without angina pectoris: Secondary | ICD-10-CM | POA: Insufficient documentation

## 2018-03-20 DIAGNOSIS — Z7902 Long term (current) use of antithrombotics/antiplatelets: Secondary | ICD-10-CM | POA: Diagnosis not present

## 2018-03-20 DIAGNOSIS — Z923 Personal history of irradiation: Secondary | ICD-10-CM | POA: Diagnosis not present

## 2018-03-20 DIAGNOSIS — C3411 Malignant neoplasm of upper lobe, right bronchus or lung: Secondary | ICD-10-CM | POA: Diagnosis not present

## 2018-03-20 DIAGNOSIS — IMO0002 Reserved for concepts with insufficient information to code with codable children: Secondary | ICD-10-CM

## 2018-03-20 DIAGNOSIS — I10 Essential (primary) hypertension: Secondary | ICD-10-CM | POA: Diagnosis not present

## 2018-03-20 DIAGNOSIS — Z7951 Long term (current) use of inhaled steroids: Secondary | ICD-10-CM | POA: Insufficient documentation

## 2018-03-20 DIAGNOSIS — R972 Elevated prostate specific antigen [PSA]: Secondary | ICD-10-CM | POA: Insufficient documentation

## 2018-03-20 DIAGNOSIS — Z955 Presence of coronary angioplasty implant and graft: Secondary | ICD-10-CM | POA: Insufficient documentation

## 2018-03-20 DIAGNOSIS — C4492 Squamous cell carcinoma of skin, unspecified: Secondary | ICD-10-CM

## 2018-03-20 DIAGNOSIS — Z7982 Long term (current) use of aspirin: Secondary | ICD-10-CM | POA: Diagnosis not present

## 2018-03-20 DIAGNOSIS — J449 Chronic obstructive pulmonary disease, unspecified: Secondary | ICD-10-CM | POA: Insufficient documentation

## 2018-03-20 DIAGNOSIS — Z87891 Personal history of nicotine dependence: Secondary | ICD-10-CM | POA: Insufficient documentation

## 2018-03-20 DIAGNOSIS — M109 Gout, unspecified: Secondary | ICD-10-CM | POA: Diagnosis not present

## 2018-03-20 DIAGNOSIS — Z79899 Other long term (current) drug therapy: Secondary | ICD-10-CM | POA: Insufficient documentation

## 2018-03-20 DIAGNOSIS — C3491 Malignant neoplasm of unspecified part of right bronchus or lung: Secondary | ICD-10-CM

## 2018-03-20 DIAGNOSIS — E785 Hyperlipidemia, unspecified: Secondary | ICD-10-CM | POA: Diagnosis not present

## 2018-03-20 NOTE — Addendum Note (Signed)
Encounter addended by: Cori Razor, RN on: 03/20/2018 11:09 AM  Actions taken: Visit diagnoses modified

## 2018-03-20 NOTE — Progress Notes (Signed)
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  HISTORY OF PRESENT ILLNESS: Ryan Contreras is a 77 y.o. male seen at the request of Dr. Julien Nordmann for recurrent non-small cell lung cancer. The patient originally presented with mild fatigue and shortness of breath with exertion. He was noted to have a right upper lobe nodule and hilar lymphadenopathy in January 2017. He was diagnosed as unresectable stage IIA non-small cell lung cancer, squamous cell carcinoma. He is status post concurrent chemoradiation with a partial response. The patient has remained in observation since April 2017.   Repeat chest CT on 12/18/17 showed findings concerning for local recurrence of disease in the central aspect of the right upper lobe with an enlarging 2.9 x 1.7 cm nodule. No mediastinal or hilar lymphadenopathy noted at this time. There is an additional enlarging mixed sold and subsolid lesion in the medial aspect of the superior segment of the left lower lobe which is highly concerning for a slowly growing primary bronchogenic adenocarcinoma. Subsequent PET scan on 01/08/18 showed findings consistent with lung cancer recurrence within the central right upper lobe as well as new ground glass nodule in the left lower lobe with low but measurable metabolic activity concerning for low-grade adenocarcinoma. CT of the chest on 02/12/18 showed enlargement of the right upper lobe lung nodule suspicious for disease recurrence. The patient underwent navigational bronchoscopy on 03/07/18. Biopsy of the right upper lobe  showed squamous cell carcinoma. Biopsy of the left lower lobe showed scant fragments of benign bronchial wall and negative for malignancy.  The patient presented to Dr. Julien Nordmann on 03/13/18. Per his note, he discussed possible treatment options such as stereotactic radiotherapy or chemotherapy and Keytruda. The patient has been referred to discuss  radiotherapy as part of his disease management. Of note, patient had a PSA done on 12/27/17 which revealed an elevated level at 13.9.   PREVIOUS RADIATION THERAPY: Yes   Completed 01/21/16: about 6 weeks of daily external beam radiotherapy at [facility] with Dr. Orlene Erm   PAST MEDICAL HISTORY:  Past Medical History:  Diagnosis Date  . Arthritis   . CAD, NATIVE VESSEL    coronary stent x1-tx. Plavix use.  Marland Kitchen COPD (chronic obstructive pulmonary disease) ([place])    due to 50+ years Smoking.  Marland Kitchen Dyspnea    wears oxygen PRN  . Gastrointestinal bleeding, upper 04/29/2016  . Gout, unspecified    foot  . HYPERLIPIDEMIA   . HYPERTENSION   . Lung mass   . NSCL ca dx'd 12/2015  . On supplemental oxygen by nasal cannula    PRN  . PEPTIC ULCER DISEASE   . Pneumonia   . TOBACCO ABUSE   . Type 2 diabetes mellitus ([place])    denies on 01-06-16       PAST SURGICAL HISTORY: Past Surgical History:  Procedure Laterality Date  . CARDIAC CATHETERIZATION     '06-stent placed, 6'13 - Dr. Lizbeth Bark follows  . CORONARY STENT PLACEMENT     s/p  . ENDOBRONCHIAL ULTRASOUND Bilateral 01/11/2016   Procedure:  ENDOBRONCHIAL ULTRASOUND;  Surgeon: Collene Gobble, MD;  Location: [facility];  Service: Endoscopy;  Laterality: Bilateral;  . VIDEO BRONCHOSCOPY WITH ENDOBRONCHIAL NAVIGATION Right 03/07/2018   Procedure: VIDEO BRONCHOSCOPY WITH ENDOBRONCHIAL NAVIGATION;  Surgeon: Collene Gobble, MD;  Location: [facility] OR;  Service: Thoracic;  Laterality: Right;     FAMILY HISTORY:  Family History  Problem Relation Age of Onset  . Breast cancer Sister   . Cancer  Brother        Uncertain type  . Arthritis Mother   . Hypertension Father   . Heart attack Father      SOCIAL HISTORY:  reports that he has quit smoking. His smoking use included cigarettes. He has a 25.00 pack-year smoking history. He has never used smokeless tobacco. He reports that he drank alcohol. He reports that he does not use drugs.  The patient is divorced and lives in [place], [place]. His daughter passed away 2 months ago and his second daughter requires his care.  ALLERGIES: Patient has no known allergies.   MEDICATIONS:  Current Outpatient Medications  Medication Sig Dispense Refill  . acetaminophen (TYLENOL) 500 MG tablet Take 250 mg by mouth every 6 (six) hours as needed for moderate pain or headache.     . allopurinol (ZYLOPRIM) 100 MG tablet TAKE 1 TABLET BY MOUTH EVERY DAY (Patient taking differently: TAKE 100 MG BY MOUTH EVERY DAY) 90 tablet 3  . aspirin 81 MG tablet Take 1 tablet (81 mg total) by mouth at bedtime. Restart on 03/09/18 30 tablet   . atorvastatin (LIPITOR) 80 MG tablet TAKE 1 TABLET BY MOUTH EVERY DAY (Patient taking differently: TAKE 80 MG BY MOUTH EVERY DAY) 30 tablet 2  . budesonide-formoterol (SYMBICORT) 160-4.5 MCG/ACT inhaler Inhale 2 puffs into the lungs 2 (two) times daily. 1 Inhaler 0  . clopidogrel (PLAVIX) 75 MG tablet Take 1 tablet (75 mg total) by mouth daily. Please restart on 03/09/18 30 tablet 2  . docusate sodium (COLACE) 100 MG capsule Take 100 mg by mouth at bedtime.    . donepezil (ARICEPT) 10 MG tablet Take 1 tablet (10 mg total) by mouth at bedtime. 30 tablet 11  . eplerenone (INSPRA) 25 MG tablet Take 1 tablet (25 mg total) by mouth daily. 30 tablet 5  . latanoprost (XALATAN) 0.005 % ophthalmic solution Place 1 drop into both eyes at bedtime.  6  . losartan-hydrochlorothiazide (HYZAAR) 100-12.5 MG tablet TAKE 1 TABLET BY MOUTH DAILY. 90 tablet 2  . oxybutynin (DITROPAN) 5 MG tablet Take 5 mg by mouth at bedtime.  11  . pantoprazole  (PROTONIX) 40 MG tablet TAKE 40 MG BY MOUTH EVERY DAY 90 tablet 3  . potassium chloride SA (KLOR-CON M20) 20 MEQ tablet Take 1 tablet (20 mEq total) by mouth daily. 30 tablet 11  . PROAIR HFA 108 (90 Base) MCG/ACT inhaler Inhale 2 puffs into the lungs every 6 (six) hours as needed for wheezing or shortness of breath.  0  . SYMBICORT 160-4.5 MCG/ACT inhaler INHALE 2 PUFFS BY MOUTH INTO THE LUNGS 2 (TWO) TIMES DAILY. 10.2 Inhaler 3  . Tiotropium Bromide-Olodaterol (STIOLTO RESPIMAT) 2.5-2.5 MCG/ACT AERS Inhale 2 puffs into the lungs daily. 1 Inhaler 5   No current facility-administered medications for this encounter.      REVIEW OF SYSTEMS: On review of systems, the patient reports that he is doing well overall. He reports shortness of breath with exertion. He denies any chest pain, cough, fevers, chills, night sweats, unintended weight changes.  He denies any bowel or bladder disturbances, and denies abdominal pain, nausea or vomiting. He denies any new musculoskeletal or joint aches or pains. A complete review of systems is obtained and is otherwise negative.   PHYSICAL EXAM:  Wt Readings from Last 3 Encounters:  03/20/18 201 lb (91.2 kg)  03/15/18 199 lb (90.3 kg)  03/13/18 196 lb 8 oz (89.1 kg)   Temp Readings from Last 3 Encounters:  03/20/18 98.3 F (36.8 C) (Oral)  03/13/18 98.6 F (37 C) (Oral)  03/07/18 98.6 F (37 C)   BP Readings from Last 3 Encounters:  03/20/18 118/63  03/15/18 120/70  03/13/18 (!) 151/54   Pulse Readings from Last 3 Encounters:  03/20/18 (!) 107  03/15/18 (!) 107  03/13/18 95   Pain Assessment Pain Score: 0-No pain/10  In general this is a well appearing African American male in no acute distress. He is alert and oriented x4 and appropriate throughout the examination. HEENT reveals that the patient is normocephalic, atraumatic. EOMs are intact. PERRLA. Skin is intact without any evidence of gross lesions. Cardiovascular exam reveals a regular rate  and rhythm, no clicks rubs or murmurs are auscultated. Chest is clear to auscultation bilaterally. Lymphatic assessment is performed and does not reveal any adenopathy in the cervical, supraclavicular, axillary, or inguinal chains. Abdomen has active bowel sounds in all quadrants and is intact. The abdomen is soft, non tender, non distended. Lower extremities are negative for pretibial pitting edema, deep calf tenderness, cyanosis or clubbing.   ECOG = 0  0 - Asymptomatic (Fully active, able to carry on all predisease activities without restriction)  1 - Symptomatic but completely ambulatory (Restricted in physically strenuous activity but ambulatory and able to carry out work of a light or sedentary nature. For example, light housework, office work)  2 - Symptomatic, <50% in bed during the day (Ambulatory and capable of all self care but unable to carry out any work activities. Up and about more than 50% of waking hours)  3 - Symptomatic, >50% in bed, but not bedbound (Capable of only limited self-care, confined to bed or chair 50% or more of waking hours)  4 - Bedbound (Completely disabled. Cannot carry on any self-care. Totally confined to bed or chair)  5 - Death   Eustace Pen MM, Creech RH, Tormey DC, et al. [phone]). "Toxicity and response criteria of the [facility]". [place] Oncol. 5 (6): 649-55    LABORATORY DATA:  Lab Results  Component Value Date   WBC 9.0 03/13/2018   HGB 11.6 (L) 03/07/2018   HCT 36.0 (L) 03/13/2018   MCV 97.8 03/13/2018   PLT 254 03/13/2018   Lab Results  Component Value Date   NA 139 03/13/2018   K 4.3 03/13/2018   CL 99 03/13/2018   CO2 32 (H) 03/13/2018   Lab Results  Component Value Date   ALT 18 03/13/2018   AST 21 03/13/2018   ALKPHOS 88 03/13/2018   BILITOT 0.5 03/13/2018      RADIOGRAPHY: Dg Chest Port 1 View  Result Date: 03/07/2018 CLINICAL DATA:  S/p bronchoscopy with biopsy. EXAM: PORTABLE CHEST 1 VIEW  COMPARISON:  Chest x-ray dated 02/14/2018. Chest CT dated 02/12/2018. FINDINGS: Right perihilar opacity is stable, compatible with the perihilar mass demonstrated on earlier chest CT. Left perihilar region is also stable in the short-term interval, likely accentuated by the sub solid lesion demonstrated in the medial left lower lobe on earlier chest CT. No new  lung findings. No pleural effusion or pneumothorax seen. Heart size and mediastinal contours are stable. IMPRESSION: Stable chest x-ray, as detailed above. No pleural effusion, pneumothorax or other postprocedural complicating features. Electronically Signed   By: Franki Cabot M.D.   On: 03/07/2018 11:06   Dg C-arm Bronchoscopy  Result Date: 03/07/2018 C-ARM BRONCHOSCOPY: Fluoroscopy was utilized by the requesting physician.  No radiographic interpretation.       IMPRESSION/PLAN: 1. Recurrent metastaic Stage IIA, cT1bN1M0, NSCLC, squamous cell carcinoma of the RUL. Dr. Lisbeth Renshaw discusses the pathology findings and reviews the nature of lung cancer. He discusses the options of ablative treatment with SBRT style therapy. Give his prior treatment, Dr. Lisbeth Renshaw would fractionate this differently, specifically to the RUL. We discussed the risks, benefits, short and long term effects of radiotherapy, and the patient is interested in proceeding. Dr. Lisbeth Renshaw discusses the delivery and logistics of radiotherapy and anticipates 3-5 treatments to the left lung, possibly up to 8-10 depending on his authorization, and 8-10 treatments to the right upper lobe. Written consent is obtained and placed in the chart, a copy was provided for the patient. We will obtain his detailed dosimetry records and proceed with simulation. Our staff will contact him to scheduled simulation. 2. Elevated PSA. Patient is being worked up by Dr. Junious Silk for his PSA, and he will undergo MRI of the prostate 03/26/18. He has not had biopsy to date.  The above documentation reflects my direct  findings during this shared patient visit. Please see the separate note by Dr. Lisbeth Renshaw on this date for the remainder of the patient's plan of care.    Carola Rhine, PAC  This document serves as a record of services personally performed by Kyung Rudd, MD and Shona Simpson, PA-C. It was created on their behalf by Bethann Humble, a trained medical scribe. The creation of this record is based on the scribe's personal observations and the provider's statements to them. This document has been checked and approved by the attending provider.

## 2018-03-26 ENCOUNTER — Ambulatory Visit
Admission: RE | Admit: 2018-03-26 | Discharge: 2018-03-26 | Disposition: A | Discharge status: Home / Self Care | Payer: Medicare Other | Source: Ambulatory Visit | Attending: Urology | Admitting: Urology

## 2018-03-26 DIAGNOSIS — R972 Elevated prostate specific antigen [PSA]: Secondary | ICD-10-CM

## 2018-03-26 MED ORDER — GADOBENATE DIMEGLUMINE 529 MG/ML IV SOLN
19.0000 mL | Freq: Once | INTRAVENOUS | Status: AC | PRN
  Administered 2018-03-26: 19 mL via INTRAVENOUS

## 2018-03-28 ENCOUNTER — Other Ambulatory Visit: Payer: Self-pay | Admitting: Urology

## 2018-03-29 ENCOUNTER — Encounter: Payer: Self-pay | Admitting: *Deleted

## 2018-03-29 NOTE — Progress Notes (Signed)
[facility] Psychosocial Distress Screening Clinical Social Work  Clinical Social Work was referred by distress screening protocol.  The patient scored a 7 on the Psychosocial Distress Thermometer which indicates moderate distress. Clinical Social Worker contacted patient to assess for distress and other psychosocial needs. Mr. Robarts reported he completed [facility] appointment today.  He shared he has received treatment before and has no concerns at this time.  He rated his anxiety around a "7" today, but stated he has no concerns.  CSW explored causes of anxiety and how patient typically copes.  His only concern was "getting to all my appointments".  The patient drives or rides with one of his sisters- he was interested in a gas card and was instructed to meet with radiation financial advocates.  He lives with his daughter, states she "has problems", but did not specify.  He identified his two sisters as his main source of support.  CSW encouraged patient to call if he has any questions or concerns.  ONCBCN DISTRESS SCREENING 03/20/2018  Screening Type Initial Screening  Distress experienced in past week (1-10) 7  Family Problem type Children  Emotional problem type Nervousness/Anxiety;Adjusting to illness  Spiritual/Religous concerns type   Information Concerns Type   Physical Problem type Breathing  Referral to clinical social work     Maryjean Morn, MSW, LCSW, OSW-C Clinical Social Worker [facility] [phone]

## 2018-03-30 ENCOUNTER — Ambulatory Visit
Admission: RE | Admit: 2018-03-30 | Discharge: 2018-03-30 | Disposition: A | Discharge status: Home / Self Care | Payer: Medicare Other | Source: Ambulatory Visit | Attending: Radiation Oncology | Admitting: Radiation Oncology

## 2018-03-30 DIAGNOSIS — C3411 Malignant neoplasm of upper lobe, right bronchus or lung: Secondary | ICD-10-CM | POA: Diagnosis present

## 2018-03-30 DIAGNOSIS — Z923 Personal history of irradiation: Secondary | ICD-10-CM | POA: Diagnosis not present

## 2018-04-02 ENCOUNTER — Encounter: Payer: Self-pay | Admitting: Endocrinology

## 2018-04-03 NOTE — Telephone Encounter (Signed)
Error opening  

## 2018-04-04 ENCOUNTER — Other Ambulatory Visit: Payer: Self-pay | Admitting: Internal Medicine

## 2018-04-05 ENCOUNTER — Telehealth: Payer: Self-pay | Admitting: Radiation Oncology

## 2018-04-05 DIAGNOSIS — C3490 Malignant neoplasm of unspecified part of unspecified bronchus or lung: Secondary | ICD-10-CM

## 2018-04-05 NOTE — Telephone Encounter (Signed)
I spoke with the patient about the need to proceed with repeat PET prior to considering his treatment as his disease on simulation planning appears greater than on his last PET in January. Given the re-treatment, Dr. Lisbeth Renshaw wants planning to be as precise as possible.

## 2018-04-05 NOTE — Telephone Encounter (Signed)
I let the patient know we need to repeat PET scan due to the changes in the right lung since his prior PET in January compared to his simulation. He is in agreement. We will cancel treatment for now until his PET has been performed and planning can occur.

## 2018-04-06 ENCOUNTER — Telehealth: Payer: Self-pay | Admitting: *Deleted

## 2018-04-06 NOTE — Telephone Encounter (Signed)
Called patient to inform of Pet Scan on 04-11-18- arrival time - 1:30 pm @ [facility], pt. To be NPO- 6 hrs. Prior to test, spoke with patient and he is aware of this test.

## 2018-04-07 ENCOUNTER — Other Ambulatory Visit: Payer: Self-pay | Admitting: Internal Medicine

## 2018-04-09 NOTE — Telephone Encounter (Signed)
Patients pcp has been checking lipids. Okay to refill or defer to Dr Loanne Drilling? Please advise. Thanks, MI

## 2018-04-10 ENCOUNTER — Ambulatory Visit: Payer: Medicare Other | Admitting: Radiation Oncology

## 2018-04-11 ENCOUNTER — Encounter (HOSPITAL_COMMUNITY)
Admission: RE | Admit: 2018-04-11 | Discharge: 2018-04-11 | Disposition: A | Discharge status: Home / Self Care | Payer: Medicare Other | Source: Ambulatory Visit | Attending: Radiation Oncology | Admitting: Radiation Oncology

## 2018-04-11 ENCOUNTER — Ambulatory Visit: Payer: Medicare Other | Admitting: Radiation Oncology

## 2018-04-11 DIAGNOSIS — C3490 Malignant neoplasm of unspecified part of unspecified bronchus or lung: Secondary | ICD-10-CM | POA: Diagnosis not present

## 2018-04-11 LAB — GLUCOSE, CAPILLARY: Glucose-Capillary: 104 mg/dL — ABNORMAL HIGH (ref 65–99)

## 2018-04-11 MED ORDER — FLUDEOXYGLUCOSE F - 18 (FDG) INJECTION
10.4000 | Freq: Once | INTRAVENOUS | Status: AC | PRN
  Administered 2018-04-11: 10.4 via INTRAVENOUS

## 2018-04-12 ENCOUNTER — Ambulatory Visit: Payer: Medicare Other | Admitting: Radiation Oncology

## 2018-04-13 ENCOUNTER — Telehealth: Payer: Self-pay | Admitting: Emergency Medicine

## 2018-04-13 ENCOUNTER — Ambulatory Visit: Payer: Medicare Other | Admitting: Radiation Oncology

## 2018-04-13 NOTE — Telephone Encounter (Signed)
Called pt to let him know that Ryan Contreras at [facility] was the one who ordered the PET scan to be done. Stated to him to call her to see if she could give him the results of the scan.  Pt expressed understanding. Stated he would call Ryan Contreras. Nothing further needed at this time.

## 2018-04-16 ENCOUNTER — Other Ambulatory Visit: Payer: Self-pay | Admitting: Radiation Oncology

## 2018-04-16 ENCOUNTER — Telehealth: Payer: Self-pay | Admitting: Radiation Oncology

## 2018-04-16 ENCOUNTER — Ambulatory Visit: Payer: Medicare Other | Admitting: Radiation Oncology

## 2018-04-16 DIAGNOSIS — C3490 Malignant neoplasm of unspecified part of unspecified bronchus or lung: Secondary | ICD-10-CM

## 2018-04-16 NOTE — Telephone Encounter (Signed)
I called and spoke with the patient about biopsy of the right subpectoral node. The patient is in agreement and will be scheduled for this. I'll reach out to IR to determine CT or [place] approach.

## 2018-04-17 ENCOUNTER — Ambulatory Visit: Payer: Medicare Other | Admitting: Radiation Oncology

## 2018-04-18 ENCOUNTER — Telehealth: Payer: Self-pay | Admitting: Medical Oncology

## 2018-04-18 ENCOUNTER — Telehealth: Payer: Self-pay | Admitting: Radiation Oncology

## 2018-04-18 ENCOUNTER — Ambulatory Visit: Payer: Medicare Other | Admitting: Radiation Oncology

## 2018-04-18 NOTE — Telephone Encounter (Signed)
-----   Message from Cori Razor, RN sent at 04/18/2018 12:04 PM EDT ----- Regarding: Test Results Ryan Contreras called again today requesting results from his PET.  I see you talked to him on the 6th.  He left a call back number [phone] or [phone]

## 2018-04-18 NOTE — Telephone Encounter (Signed)
I returned his call and LM to return my call .

## 2018-04-18 NOTE — Telephone Encounter (Signed)
I called the patient and his sister and we discussed the PET scan results, and rationale for biopsy of the retropectoral node. I will contact IR to discuss this further as well since this was not scheduled yet.

## 2018-04-18 NOTE — Telephone Encounter (Signed)
Asking for PET scan results.Pt said he just heard about PET scan and biopsy pending.

## 2018-04-19 ENCOUNTER — Ambulatory Visit: Payer: Medicare Other | Admitting: Radiation Oncology

## 2018-04-20 ENCOUNTER — Ambulatory Visit: Payer: Medicare Other | Admitting: Radiation Oncology

## 2018-04-23 ENCOUNTER — Ambulatory Visit: Payer: Medicare Other | Admitting: Radiation Oncology

## 2018-04-24 ENCOUNTER — Other Ambulatory Visit: Payer: Self-pay | Admitting: Radiation Oncology

## 2018-04-24 DIAGNOSIS — C3490 Malignant neoplasm of unspecified part of unspecified bronchus or lung: Secondary | ICD-10-CM

## 2018-05-01 ENCOUNTER — Other Ambulatory Visit: Payer: Self-pay | Admitting: Radiology

## 2018-05-02 ENCOUNTER — Ambulatory Visit (HOSPITAL_COMMUNITY)
Admission: RE | Admit: 2018-05-02 | Discharge: 2018-05-02 | Disposition: A | Discharge status: Home / Self Care | Payer: Medicare Other | Source: Ambulatory Visit | Attending: Radiation Oncology | Admitting: Radiation Oncology

## 2018-05-02 ENCOUNTER — Encounter (HOSPITAL_COMMUNITY): Payer: Self-pay

## 2018-05-02 DIAGNOSIS — Z8711 Personal history of peptic ulcer disease: Secondary | ICD-10-CM | POA: Insufficient documentation

## 2018-05-02 DIAGNOSIS — I2510 Atherosclerotic heart disease of native coronary artery without angina pectoris: Secondary | ICD-10-CM | POA: Diagnosis not present

## 2018-05-02 DIAGNOSIS — Z87891 Personal history of nicotine dependence: Secondary | ICD-10-CM | POA: Insufficient documentation

## 2018-05-02 DIAGNOSIS — Z8249 Family history of ischemic heart disease and other diseases of the circulatory system: Secondary | ICD-10-CM | POA: Insufficient documentation

## 2018-05-02 DIAGNOSIS — C7801 Secondary malignant neoplasm of right lung: Secondary | ICD-10-CM | POA: Insufficient documentation

## 2018-05-02 DIAGNOSIS — C3491 Malignant neoplasm of unspecified part of right bronchus or lung: Secondary | ICD-10-CM | POA: Diagnosis not present

## 2018-05-02 DIAGNOSIS — Z7982 Long term (current) use of aspirin: Secondary | ICD-10-CM | POA: Insufficient documentation

## 2018-05-02 DIAGNOSIS — N419 Inflammatory disease of prostate, unspecified: Secondary | ICD-10-CM | POA: Diagnosis not present

## 2018-05-02 DIAGNOSIS — M109 Gout, unspecified: Secondary | ICD-10-CM | POA: Insufficient documentation

## 2018-05-02 DIAGNOSIS — J449 Chronic obstructive pulmonary disease, unspecified: Secondary | ICD-10-CM | POA: Diagnosis not present

## 2018-05-02 DIAGNOSIS — Z923 Personal history of irradiation: Secondary | ICD-10-CM | POA: Insufficient documentation

## 2018-05-02 DIAGNOSIS — Z955 Presence of coronary angioplasty implant and graft: Secondary | ICD-10-CM | POA: Diagnosis not present

## 2018-05-02 DIAGNOSIS — Z7902 Long term (current) use of antithrombotics/antiplatelets: Secondary | ICD-10-CM | POA: Insufficient documentation

## 2018-05-02 DIAGNOSIS — M1990 Unspecified osteoarthritis, unspecified site: Secondary | ICD-10-CM | POA: Insufficient documentation

## 2018-05-02 DIAGNOSIS — Z79899 Other long term (current) drug therapy: Secondary | ICD-10-CM | POA: Diagnosis not present

## 2018-05-02 DIAGNOSIS — E119 Type 2 diabetes mellitus without complications: Secondary | ICD-10-CM | POA: Insufficient documentation

## 2018-05-02 DIAGNOSIS — Z9221 Personal history of antineoplastic chemotherapy: Secondary | ICD-10-CM | POA: Insufficient documentation

## 2018-05-02 DIAGNOSIS — E785 Hyperlipidemia, unspecified: Secondary | ICD-10-CM | POA: Insufficient documentation

## 2018-05-02 DIAGNOSIS — Z8261 Family history of arthritis: Secondary | ICD-10-CM | POA: Diagnosis not present

## 2018-05-02 DIAGNOSIS — Z9889 Other specified postprocedural states: Secondary | ICD-10-CM | POA: Insufficient documentation

## 2018-05-02 DIAGNOSIS — C3490 Malignant neoplasm of unspecified part of unspecified bronchus or lung: Secondary | ICD-10-CM | POA: Diagnosis present

## 2018-05-02 DIAGNOSIS — Z803 Family history of malignant neoplasm of breast: Secondary | ICD-10-CM | POA: Insufficient documentation

## 2018-05-02 LAB — CBC WITH DIFFERENTIAL/PLATELET
BASOS PCT: 0 %
Basophils Absolute: 0.1 10*3/uL (ref 0.0–0.1)
EOS ABS: 0.2 10*3/uL (ref 0.0–0.7)
EOS PCT: 2 %
HEMATOCRIT: 30.2 % — AB (ref 39.0–52.0)
Hemoglobin: 9.7 g/dL — ABNORMAL LOW (ref 13.0–17.0)
Lymphocytes Relative: 22 %
Lymphs Abs: 2.5 10*3/uL (ref 0.7–4.0)
MCH: 30.6 pg (ref 26.0–34.0)
MCHC: 32.1 g/dL (ref 30.0–36.0)
MCV: 95.3 fL (ref 78.0–100.0)
MONO ABS: 1.5 10*3/uL — AB (ref 0.1–1.0)
Monocytes Relative: 13 %
Neutro Abs: 7.4 10*3/uL (ref 1.7–7.7)
Neutrophils Relative %: 63 %
PLATELETS: 582 10*3/uL — AB (ref 150–400)
RBC: 3.17 MIL/uL — ABNORMAL LOW (ref 4.22–5.81)
RDW: 15.1 % (ref 11.5–15.5)
WBC: 11.7 10*3/uL — ABNORMAL HIGH (ref 4.0–10.5)

## 2018-05-02 LAB — PROTIME-INR
INR: 1.03
Prothrombin Time: 13.4 seconds (ref 11.4–15.2)

## 2018-05-02 MED ORDER — FENTANYL CITRATE (PF) 100 MCG/2ML IJ SOLN
INTRAMUSCULAR | Status: AC | PRN
  Administered 2018-05-02 (×2): 50 ug via INTRAVENOUS

## 2018-05-02 MED ORDER — FENTANYL CITRATE (PF) 100 MCG/2ML IJ SOLN
INTRAMUSCULAR | Status: AC
  Filled 2018-05-02: qty 4

## 2018-05-02 MED ORDER — MIDAZOLAM HCL 2 MG/2ML IJ SOLN
INTRAMUSCULAR | Status: AC | PRN
  Administered 2018-05-02 (×2): 1 mg via INTRAVENOUS

## 2018-05-02 MED ORDER — SODIUM CHLORIDE 0.9 % IV SOLN
INTRAVENOUS | Status: DC
  Administered 2018-05-02: 12:00:00 via INTRAVENOUS

## 2018-05-02 MED ORDER — MIDAZOLAM HCL 2 MG/2ML IJ SOLN
INTRAMUSCULAR | Status: AC
  Filled 2018-05-02: qty 4

## 2018-05-02 MED ORDER — LIDOCAINE HCL 1 % IJ SOLN
INTRAMUSCULAR | Status: AC
  Filled 2018-05-02: qty 10

## 2018-05-02 NOTE — Discharge Instructions (Signed)
Moderate Conscious Sedation, Adult, Care After These instructions provide you with information about caring for yourself after your procedure. Your health care provider may also give you more specific instructions. Your treatment has been planned according to current medical practices, but problems sometimes occur. Call your health care provider if you have any problems or questions after your procedure. What can I expect after the procedure? After your procedure, it is common:  To feel sleepy for several hours.  To feel clumsy and have poor balance for several hours.  To have poor judgment for several hours.  To vomit if you eat too soon.  Follow these instructions at home: For at least 24 hours after the procedure:   Do not: ? Participate in activities where you could fall or become injured. ? Drive. ? Use heavy machinery. ? Drink alcohol. ? Take sleeping pills or medicines that cause drowsiness. ? Make important decisions or sign legal documents. ? Take care of children on your own.  Rest. Eating and drinking  Follow the diet recommended by your health care provider.  If you vomit: ? Drink water, juice, or soup when you can drink without vomiting. ? Make sure you have little or no nausea before eating solid foods. General instructions  Have a responsible adult stay with you until you are awake and alert.  Take over-the-counter and prescription medicines only as told by your health care provider.  If you smoke, do not smoke without supervision.  Keep all follow-up visits as told by your health care provider. This is important. Contact a health care provider if:  You keep feeling nauseous or you keep vomiting.  You feel light-headed.  You develop a rash.  You have a fever. Get help right away if:  You have trouble breathing. This information is not intended to replace advice given to you by your health care provider. Make sure you discuss any questions you have  with your health care provider. Document Released: 09/18/2013 Document Revised: 05/02/2016 Document Reviewed: 03/19/2016 Elsevier Interactive Patient Education  2018 [employer].   Needle Biopsy, Care After These instructions give you information about caring for yourself after your procedure. Your doctor may also give you more specific instructions. Call your doctor if you have any problems or questions after your procedure. Follow these instructions at home:  Rest as told by your doctor.  Take medicines only as told by your doctor.  There are many different ways to close and cover the biopsy site, including stitches (sutures), skin glue, and adhesive strips. Follow instructions from your doctor about: ? How to take care of your biopsy site. ? When and how you should change your bandage (dressing). ? When you should remove your dressing.  You may remove your dressing tomorrow. ? Removing whatever was used to close your biopsy site.  Check your biopsy site every day for signs of infection. Watch for: ? Redness, swelling, or pain. ? Fluid, blood, or pus. Contact a doctor if:  You have a fever.  You have redness, swelling, or pain at the biopsy site, and it lasts longer than a few days.  You have fluid, blood, or pus coming from the biopsy site.  You feel sick to your stomach (nauseous).  You throw up (vomit). Get help right away if:  You are short of breath.  You have trouble breathing.  Your chest hurts.  You feel dizzy or you pass out (faint).  You have bleeding that does not stop with pressure or  a bandage.  You cough up blood.  Your belly (abdomen) hurts. This information is not intended to replace advice given to you by your health care provider. Make sure you discuss any questions you have with your health care provider. Document Released: 11/10/2008 Document Revised: 05/05/2016 Document Reviewed: 11/24/2014 Elsevier Interactive Patient Education  [employer].

## 2018-05-02 NOTE — Consult Note (Signed)
Chief Complaint: Patient was seen in consultation today for image guided biopsy of right subpectoral lymph node  Referring Physician(s): Bartlett Claire/Mohamed,M  Supervising Physician: Jacqulynn Cadet  Patient Status: [facility] - Out-pt  History of Present Illness: Ryan Contreras is a 77 y.o. 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  Recent PET scan has revealed: 1. Disease progression, as evidenced by increase in size of right upper lobe lung mass with development of right subpectoral nodal metastasis. Equivocal right mediastinal and low right jugular nodes. 2. Persistent hypermetabolism within the superior segment left lower lobe sub solid pulmonary nodule which may have enlarged minimally since the prior exam. 3. Hypermetabolic patchy right lower lobe pulmonary opacity is suspicious for an area of infection. 4. Improved prostatic peripheral zone hypermetabolism which could relate to prostatitis or carcinoma. 5. Subtle hypermetabolism within the appendix without dominant mass or surrounding inflammation. Recommend attention on follow-up. 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  He presents today for image guided right subpectoral lymph node biopsy for further evaluation.  Past Medical History:  Diagnosis Date  . Arthritis   . CAD, NATIVE VESSEL    coronary stent x1-tx. Plavix use.  Marland Kitchen COPD (chronic obstructive pulmonary disease) ([place])    due to 50+ years Smoking.  Marland Kitchen Dyspnea    wears oxygen PRN  . Gastrointestinal bleeding, upper 04/29/2016  . Gout, unspecified    foot  . HYPERLIPIDEMIA   . HYPERTENSION   . Lung mass   . NSCL ca dx'd 12/2015  . On supplemental oxygen by nasal cannula    PRN  . PEPTIC ULCER DISEASE   . Pneumonia   . TOBACCO ABUSE   . Type 2 diabetes mellitus ([place])    denies on 01-06-16    Past Surgical History:  Procedure Laterality  Date  . CARDIAC CATHETERIZATION     '06-stent placed, 6'13 - Dr. Lizbeth Bark follows  . CORONARY STENT PLACEMENT     s/p  . ENDOBRONCHIAL ULTRASOUND Bilateral 01/11/2016   Procedure: ENDOBRONCHIAL ULTRASOUND;  Surgeon: Collene Gobble, MD;  Location: [facility];  Service: Endoscopy;  Laterality: Bilateral;  . VIDEO BRONCHOSCOPY WITH ENDOBRONCHIAL NAVIGATION Right 03/07/2018   Procedure: VIDEO BRONCHOSCOPY WITH ENDOBRONCHIAL NAVIGATION;  Surgeon: Collene Gobble, MD;  Location: [facility];  Service: Thoracic;  Laterality: Right;    Allergies: Patient has no known allergies.  Medications: Prior to Admission medications   Medication Sig Start Date End Date Taking? Authorizing Provider  acetaminophen (TYLENOL) 500 MG tablet Take 250 mg by mouth every 6 (six) hours as needed for moderate pain or headache.    Yes [provider]  allopurinol (ZYLOPRIM) 100 MG tablet TAKE 1 TABLET BY MOUTH EVERY DAY Patient taking differently: TAKE 100 MG BY MOUTH EVERY DAY 05/18/17  Yes Renato Shin, MD  aspirin 81 MG tablet Take 1 tablet (81 mg total) by mouth at bedtime. Restart on 03/09/18 03/07/18  Yes Collene Gobble, MD  atorvastatin (LIPITOR) 80 MG tablet TAKE 80 MG BY MOUTH EVERY DAY 04/09/18  Yes Fay Records, MD  budesonide-formoterol [facility]) 160-4.5 MCG/ACT inhaler Inhale 2 puffs into the lungs 2 (two) times daily. 01/16/18  Yes Tanda Rockers, MD  docusate sodium (COLACE) 100 MG capsule Take 100 mg by mouth at bedtime.   Yes [provider]  eplerenone (INSPRA) 25 MG tablet Take 1 tablet (25 mg total) by mouth daily. 11/24/17  Yes Noralee Space, MD  latanoprost (XALATAN) 0.005 % ophthalmic solution Place 1 drop into both eyes at bedtime. 12/30/17  Yes [provider]  losartan-hydrochlorothiazide (HYZAAR) 100-12.5 MG tablet TAKE 1 TABLET BY MOUTH DAILY. 06/04/17  Yes Renato Shin, MD  oxybutynin (DITROPAN) 5 MG tablet Take 5 mg by mouth at bedtime. 01/10/18  Yes [provider]  pantoprazole (PROTONIX) 40 MG tablet TAKE 40 MG BY MOUTH EVERY DAY 03/07/18  Yes Byrum, Rose Fillers, MD  potassium chloride SA (KLOR-CON M20) 20 MEQ tablet Take 1 tablet (20 mEq total) by mouth daily. 06/26/17  Yes Renato Shin, MD  PROAIR HFA [phone] Base) MCG/ACT inhaler Inhale 2 puffs into the lungs every 6 (six) hours as needed for wheezing or shortness of breath. 02/17/18  Yes [provider]  SYMBICORT 160-4.5 MCG/ACT inhaler INHALE 2 PUFFS BY MOUTH INTO THE LUNGS 2 (TWO) TIMES DAILY. 03/07/18  Yes Noralee Space, MD  clopidogrel (PLAVIX) 75 MG tablet PLEASE SEE ATTACHED FOR DETAILED DIRECTIONS 04/05/18   Fay Records, MD  donepezil (ARICEPT) 10 MG tablet Take 1 tablet (10 mg total) by mouth at bedtime. 12/27/17   Renato Shin, MD  Tiotropium Bromide-Olodaterol (STIOLTO RESPIMAT) 2.5-2.5 MCG/ACT AERS Inhale 2 puffs into the lungs daily. 03/15/18   Collene Gobble, MD     Family History  Problem Relation Age of Onset  . Breast cancer Sister   . Cancer Brother        Uncertain type  . Arthritis Mother   . Hypertension Father   . Heart attack Father     Social History   Socioeconomic History  . Marital status: Divorced    Spouse name: Not on file  . Number of children: 2  . Years of education: Not on file  . Highest education level: Not on file  Occupational History  . 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 Financial resource strain: Not on file  . Food insecurity:    Worry: Not on file    Inability: Not on file  . Transportation needs:    Medical: Not on file    Non-medical: Not on file  Tobacco Use  . Smoking status: Former Smoker    Packs/day: 0.50    Years: 50.00    Pack years: 25.00    Types: Cigarettes  . Smokeless tobacco: Never Used  . Tobacco comment: stopped smoking cigarettes 2/ 2019  Substance and Sexual Activity  . Alcohol use: Not Currently    Alcohol/week: 0.0 oz  . Drug use: No  . Sexual activity: Not on file  Lifestyle  . Physical  activity:    Days per week: Not on file    Minutes per session: Not on file  . Stress: Not on file  Relationships  . Social connections:    Talks on phone: Not on file    Gets together: Not on file    Attends religious service: Not on file    Active member of club or organization: Not on file    Attends meetings of clubs or organizations: Not on file    Relationship status: Not on file  Other Topics Concern  . Not on file  Social History Narrative   Widowed 2003      Review of Systems denies fever, headache, chest pain, abdominal pain, back pain, nausea, vomiting or bleeding.  Patient does have dyspnea and cough.   Vital Signs: Blood pressure 120/77, heart rate 116, temp 98.7, respirations  20, O2 sat 94% room air   Physical Exam awake, alert.  Chest with distant breath sounds bilaterally.  Anterior upper chest wall keloid formation noted; heart with tachycardic but regular rhythm.  Abdomen obese, soft, positive bowel sounds, nontender.  Trace  bilateral lower extremity edema  Imaging: Nm Pet Image Restag (ps) Skull Base To Thigh  Result Date: 04/11/2018 CLINICAL DATA:  Subsequent treatment strategy for restaging of non-small-cell lung cancer. EXAM: NUCLEAR MEDICINE PET SKULL BASE TO THIGH TECHNIQUE: 10.4 mCi F-18 FDG was injected intravenously. Full-ring PET imaging was performed from the skull base to thigh after the radiotracer. CT data was obtained and used for attenuation correction and anatomic localization. Fasting blood glucose: 104 mg/dl COMPARISON:  PET of 01/08/2018.  Chest CT 02/12/2018. FINDINGS: Mediastinal blood pool activity: SUV max 2.6 NECK: Low right jugular/supraclavicular mild hypermetabolism in the region of a 4 mm node. This measures a S.U.V. max of 2.5 on image 48/4. Incidental CT findings: Bilateral carotid atherosclerosis. Left maxillary sinus mucous retention cyst or polyp. CHEST: Hypermetabolism corresponding to enlarging right subpectoral nodes. Example node  measures 8 mm and a S.U.V. max of 6.0 on image 59/4 versus 4 mm and not hypermetabolic previously. Central right upper lobe lung mass measures 4.3 x 3.5 cm and a S.U.V. max of 15.1 on image 32/8. Compare 3.1 x 1.8 cm and a S.U.V. max of 14.3 on the prior. Right paratracheal node is equivocal, measuring 7 mm and a S.U.V. max of 3.2 on image 72/4. This is likely enlarged from 5 mm on the prior. Inferior right lower lobe pulmonary opacity laterally with low-level hypermetabolism. This measures a S.U.V. max of 2.7, including on image 61/8. The superior segment left lower lobe mixed attenuation nodule measures 2.1 cm and a S.U.V. max of 2.9 on image 36/8. Compare 1.8 cm and a S.U.V. max of 2.7 on the prior. Incidental CT findings: Aortic and coronary artery atherosclerosis. Centrilobular and paraseptal emphysema. ABDOMEN/PELVIS: No abdominopelvic nodal hypermetabolism. There is mild hypermetabolism corresponding to the mid appendix. This measures a S.U.V. max of 4.8, and is without well-defined correlate mass. Subtle hypermetabolism is identified in this area on the prior exam, including at a S.U.V. max of 2.9. Again identified is left greater than right peripheral zone prostatic hypermetabolism. Example at a S.U.V. max of 5.9 today versus a S.U.V. max of 14.7 on the prior. Incidental CT findings: Abdominal aortic atherosclerosis. Prostatomegaly. SKELETON: No abnormal marrow activity. Incidental CT findings: none IMPRESSION: 1. Disease progression, as evidenced by increase in size of right upper lobe lung mass with development of right subpectoral nodal metastasis. Equivocal right mediastinal and low right jugular nodes. 2. Persistent hypermetabolism within the superior segment left lower lobe sub solid pulmonary nodule which may have enlarged minimally since the prior exam. 3. Hypermetabolic patchy right lower lobe pulmonary opacity is suspicious for an area of infection. 4. Improved prostatic peripheral zone  hypermetabolism which could relate to prostatitis or carcinoma. 5. Subtle hypermetabolism within the appendix without dominant mass or surrounding inflammation. Recommend attention on follow-up. 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 Electronically Signed   By: Abigail Miyamoto M.D.   On: 04/11/2018 16:38    Labs:  CBC: Recent Labs    02/08/18 1118 03/07/18 0651 03/13/18 0930 05/02/18 1130  WBC 9.5 9.9 9.0 11.7*  HGB 12.6* 11.6* 11.6* 9.7*  HCT 37.5* 35.6* 36.0* 30.2*  PLT 277.0 321 254 582*    COAGS: Recent Labs    02/08/18 1118  INR  1.1*    BMP: Recent Labs    12/18/17 0811 02/08/18 1118 03/07/18 0651 03/13/18 0930  NA 139 134* 136 139  K 4.4 4.3 4.3 4.3  CL 99 96 98* 99  CO2 32* 32 26 32*  GLUCOSE 106 100* 104* 122  BUN 13 16 11 9   CALCIUM 9.2 9.3 8.7* 9.2  CREATININE 0.92 0.85 0.76 0.77  GFRNONAA >60  --  >60 >60  GFRAA >60  --  >60 >60    LIVER FUNCTION TESTS: Recent Labs    06/16/17 0906 12/18/17 0811 03/13/18 0930  BILITOT 0.46 0.5 0.5  AST 21 22 21   ALT 15 19 18   ALKPHOS 87 102 88  PROT 7.2 7.7 7.5  ALBUMIN 3.6 3.8 3.2*    TUMOR MARKERS: No results for input(s): AFPTM, CEA, CA199, CHROMGRNA in the last 8760 hours.  Assessment and Plan: 77 y.o. 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  Recent PET scan has revealed: 1. Disease progression, as evidenced by increase in size of right upper lobe lung mass with development of right subpectoral nodal metastasis. Equivocal right mediastinal and low right jugular nodes. 2. Persistent hypermetabolism within the superior segment left lower lobe sub solid pulmonary nodule which may have enlarged minimally since the prior exam. 3. Hypermetabolic patchy right lower lobe pulmonary opacity is suspicious for an area of infection. 4. Improved prostatic peripheral zone hypermetabolism which could relate  to prostatitis or carcinoma. 5. Subtle hypermetabolism within the appendix without dominant mass or surrounding inflammation. Recommend attention on follow-up. 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  He presents today for image guided right subpectoral lymph node biopsy for further evaluation.Risks and benefits discussed with the patient/sister including, but not limited to bleeding, infection, damage to adjacent structures or low yield requiring additional tests.  All of the patient's questions were answered, patient is agreeable to proceed. Consent signed and in chart.      Thank you for this interesting consult.  I greatly enjoyed meeting Cullin H Printy and look forward to participating in their care.  A copy of this report was sent to the requesting provider on this date.  Electronically Signed: D. Rowe Robert, PA-C 05/02/2018, 12:08 PM   I spent a total of  25 minutes   in face to face in clinical consultation, greater than 50% of which was counseling/coordinating care for image guided right subpectoral lymph node biopsy

## 2018-05-02 NOTE — Procedures (Signed)
Interventional Radiology Procedure Note  Procedure: US guided core biopsy right subpectoral LN  Complications: None  Estimated Blood Loss: None  Recommendations: - Path pending  Signed,  Criselda Peaches, MD

## 2018-05-03 ENCOUNTER — Telehealth: Payer: Self-pay | Admitting: Radiation Oncology

## 2018-05-03 NOTE — Telephone Encounter (Signed)
I called pt to let him know his biopsy was benign and we would recommend proceeding with SBRT. He will need to resimulate and our staff will reach out to get him rescheduled. I also let his sister [name] know.

## 2018-05-04 ENCOUNTER — Encounter (HOSPITAL_COMMUNITY): Payer: Self-pay

## 2018-05-04 ENCOUNTER — Ambulatory Visit (HOSPITAL_COMMUNITY): Payer: Medicare Other

## 2018-05-18 ENCOUNTER — Ambulatory Visit
Admission: RE | Admit: 2018-05-18 | Discharge: 2018-05-18 | Disposition: A | Discharge status: Home / Self Care | Payer: Medicare Other | Source: Ambulatory Visit | Attending: Radiation Oncology | Admitting: Radiation Oncology

## 2018-05-18 DIAGNOSIS — R590 Localized enlarged lymph nodes: Secondary | ICD-10-CM | POA: Diagnosis not present

## 2018-05-18 DIAGNOSIS — Z510 Encounter for antineoplastic radiation therapy: Secondary | ICD-10-CM | POA: Diagnosis present

## 2018-05-18 DIAGNOSIS — C3411 Malignant neoplasm of upper lobe, right bronchus or lung: Secondary | ICD-10-CM | POA: Diagnosis present

## 2018-05-18 DIAGNOSIS — C3432 Malignant neoplasm of lower lobe, left bronchus or lung: Secondary | ICD-10-CM

## 2018-05-26 ENCOUNTER — Other Ambulatory Visit: Payer: Self-pay | Admitting: Endocrinology

## 2018-05-28 DIAGNOSIS — Z510 Encounter for antineoplastic radiation therapy: Secondary | ICD-10-CM | POA: Diagnosis not present

## 2018-05-29 ENCOUNTER — Ambulatory Visit
Admission: RE | Admit: 2018-05-29 | Discharge: 2018-05-29 | Disposition: A | Discharge status: Home / Self Care | Payer: Medicare Other | Source: Ambulatory Visit | Attending: Radiation Oncology | Admitting: Radiation Oncology

## 2018-05-29 DIAGNOSIS — Z510 Encounter for antineoplastic radiation therapy: Secondary | ICD-10-CM | POA: Diagnosis not present

## 2018-05-29 NOTE — Progress Notes (Signed)
[facility]iation Oncology Simulation and Treatment Planning Note   Name:  Ryan Contreras MRN: [id number]   Date: 05/29/2018  DOB: March 28, 1941  Status:outpatient    DIAGNOSIS:    ICD-10-CM   1. Malignant neoplasm of right upper lobe of lung ([place]) C34.11      CONSENT VERIFIED:yes   SET UP: Patient is setup supine   IMMOBILIZATION: The patient was immobilized using a customized Vac Loc bag/ blue bag and customized accuform device   NARRATIVE:The patient was brought to the [facility].  Identity was confirmed.  All relevant records and images related to the planned course of therapy were reviewed.  Then, the patient was positioned in a stable reproducible clinical set-up for radiation therapy. Abdominal compression was applied by me.  4D CT images were obtained and reproducible breathing pattern was confirmed. Free breathing CT images were obtained.  Skin markings were placed.  The CT images were loaded into the planning software where the target and avoidance structures were contoured.  The radiation prescription was entered and confirmed.    TREATMENT PLANNING NOTE:  Treatment planning then occurred. I have requested : MLC's, isodose plan, basic dose calculation.  3 dimensional simulation is performed and dose volume histogram of the gross tumor volume, planning tumor volume and criticial normal structures including the spinal cord and lungs were analyzed and requested.  Special treatment procedure was performed due to high dose per fraction.  The patient will be monitored for increased risk of toxicity.  Daily imaging using cone beam CT will be used for target localization.  I anticipate that the patient will receive 60 Gy in 5 fractions to target volume on the left and 50 Gy in 5 fractions to the target region on the right. Further adjustments will be made based on the planning process is  necessary.  ------------------------------------------------  Jodelle Gross, MD, PhD

## 2018-05-30 ENCOUNTER — Ambulatory Visit: Payer: Medicare Other

## 2018-05-31 ENCOUNTER — Ambulatory Visit
Admission: RE | Admit: 2018-05-31 | Discharge: 2018-05-31 | Disposition: A | Discharge status: Home / Self Care | Payer: Medicare Other | Source: Ambulatory Visit | Attending: Radiation Oncology | Admitting: Radiation Oncology

## 2018-05-31 DIAGNOSIS — Z510 Encounter for antineoplastic radiation therapy: Secondary | ICD-10-CM | POA: Diagnosis not present

## 2018-06-01 ENCOUNTER — Ambulatory Visit: Payer: Medicare Other

## 2018-06-04 ENCOUNTER — Ambulatory Visit
Admission: RE | Admit: 2018-06-04 | Discharge: 2018-06-04 | Disposition: A | Discharge status: Home / Self Care | Payer: Medicare Other | Source: Ambulatory Visit | Attending: Radiation Oncology | Admitting: Radiation Oncology

## 2018-06-04 DIAGNOSIS — Z510 Encounter for antineoplastic radiation therapy: Secondary | ICD-10-CM | POA: Diagnosis not present

## 2018-06-06 ENCOUNTER — Ambulatory Visit
Admission: RE | Admit: 2018-06-06 | Discharge: 2018-06-06 | Disposition: A | Discharge status: Home / Self Care | Payer: Medicare Other | Source: Ambulatory Visit | Attending: Radiation Oncology | Admitting: Radiation Oncology

## 2018-06-06 DIAGNOSIS — Z510 Encounter for antineoplastic radiation therapy: Secondary | ICD-10-CM | POA: Diagnosis not present

## 2018-06-08 ENCOUNTER — Encounter: Payer: Self-pay | Admitting: Radiation Oncology

## 2018-06-08 ENCOUNTER — Ambulatory Visit
Admission: RE | Admit: 2018-06-08 | Discharge: 2018-06-08 | Disposition: A | Discharge status: Home / Self Care | Payer: Medicare Other | Source: Ambulatory Visit | Attending: Radiation Oncology | Admitting: Radiation Oncology

## 2018-06-08 DIAGNOSIS — Z510 Encounter for antineoplastic radiation therapy: Secondary | ICD-10-CM | POA: Diagnosis not present

## 2018-06-17 ENCOUNTER — Other Ambulatory Visit: Payer: Self-pay | Admitting: Endocrinology

## 2018-06-20 NOTE — Progress Notes (Signed)
  Radiation Oncology         [phone] ________________________________  Name: Ryan Contreras MRN: [id number]  Date: 06/08/2018  DOB: Jan 29, 1941  End of Treatment Note  Diagnosis:   77 y.o. male with Recurrent metastatic Stage IIA, cT1bN1M0, NSCLC, squamous cell carcinoma of the RUL, as well as a new ground glass nodule in the LLL  Indication for treatment:  Curative       Radiation treatment dates:   05/29/2018, 05/31/2018, 06/04/2018, 06/06/2018, 06/08/2018  Site/dose:    1. The tumor in the LLL was treated with a course of stereotactic body radiation treatment. The patient received 60 Gy in 5 fractions at 12 Gy per fraction. 2. The tumor in the RUL was treated with a course of stereotactic body radiation treatment. The patient received 40 Gy in 5 fractions at 8 Gy per fraction.  Narrative: The patient tolerated radiation treatment relatively well.   The patient did not have any signs of acute toxicity during treatment.  Plan: The patient has completed radiation treatment. The patient will return to radiation oncology clinic for routine followup in one month. I advised the patient to call or return sooner if they have any questions or concerns related to their recovery or treatment.   ------------------------------------------------  Jodelle Gross, MD, PhD  This document serves as a record of services personally performed by Kyung Rudd, MD. It was created on his behalf by Rae Lips, a trained medical scribe. The creation of this record is based on the scribe's personal observations and the provider's statements to them. This document has been checked and approved by the attending provider.

## 2018-06-21 ENCOUNTER — Other Ambulatory Visit: Payer: Self-pay | Admitting: Endocrinology

## 2018-06-21 ENCOUNTER — Other Ambulatory Visit: Payer: Self-pay | Admitting: Pulmonary Disease

## 2018-06-26 ENCOUNTER — Ambulatory Visit (INDEPENDENT_AMBULATORY_CARE_PROVIDER_SITE_OTHER): Payer: Medicare Other | Admitting: Endocrinology

## 2018-06-26 ENCOUNTER — Encounter: Payer: Self-pay | Admitting: Endocrinology

## 2018-06-26 ENCOUNTER — Ambulatory Visit
Admission: RE | Admit: 2018-06-26 | Discharge: 2018-06-26 | Disposition: A | Discharge status: Home / Self Care | Payer: Medicare Other | Source: Ambulatory Visit | Attending: Endocrinology | Admitting: Endocrinology

## 2018-06-26 VITALS — BP 110/56 | HR 122 | Ht 72.0 in | Wt 201.0 lb

## 2018-06-26 DIAGNOSIS — E1159 Type 2 diabetes mellitus with other circulatory complications: Secondary | ICD-10-CM

## 2018-06-26 DIAGNOSIS — R05 Cough: Secondary | ICD-10-CM

## 2018-06-26 DIAGNOSIS — R059 Cough, unspecified: Secondary | ICD-10-CM

## 2018-06-26 DIAGNOSIS — Z0000 Encounter for general adult medical examination without abnormal findings: Secondary | ICD-10-CM

## 2018-06-26 LAB — POCT GLYCOSYLATED HEMOGLOBIN (HGB A1C): HEMOGLOBIN A1C: 6.5 % — AB (ref 4.0–5.6)

## 2018-06-26 MED ORDER — MEMANTINE HCL 5 MG PO TABS: 5.0000 mg | ORAL_TABLET | Freq: Two times a day (BID) | ORAL | 11 refills | Status: DC

## 2018-06-26 NOTE — Patient Instructions (Addendum)
Please consider these measures for your health:  minimize alcohol.  Do not use tobacco products.  Have a colonoscopy at least every 10 years from age 77.  Keep firearms safely stored.  Always use seat belts.  have working smoke alarms in your home.  See an eye doctor and dentist regularly.  Never drive under the influence of alcohol or drugs (including prescription drugs).   good diet and exercise significantly improve the control of your diabetes.  please let me know if you wish to be referred to a dietician.  high blood sugar is very risky to your health.  you should see an eye doctor and dentist every year.  It is very important to get all recommended vaccinations.  It is critically important to prevent falling down (keep floor areas well-lit, dry, and free of loose objects.  If you have a cane, walker, or wheelchair, you should use it, even for short trips around the house.  Wear flat-soled shoes.  Also, try not to rush) blood tests are requested for you today.  We'll let you know about the results. I have sent a prescription to your pharmacy, to add "memantine," for the memory.   Please come back for a follow-up appointment in 4 months.

## 2018-06-26 NOTE — Progress Notes (Signed)
Subjective:    Patient ID: Ryan Contreras, male    DOB: May 10, 1941, 77 y.o.   MRN: [id number]  HPI Pt returns for f/u of diabetes mellitus:  DM type: 2 Dx'ed: 7654 Complications: CAD Therapy: none now.  DKA: never.  Severe hypoglycemia: never.  Pancreatitis: never.  Other: he has never been on insulin.   Interval history: pt states he feels well in general.   Pt reports few weeks of slight dry-quality cough, and assoc sob.  CXR at [facility] showed poss infiltrate.   Past Medical History:  Diagnosis Date  . Arthritis   . CAD, NATIVE VESSEL    coronary stent x1-tx. Plavix use.  Marland Kitchen COPD (chronic obstructive pulmonary disease) ([place])    due to 50+ years Smoking.  Marland Kitchen Dyspnea    wears oxygen PRN  . Gastrointestinal bleeding, upper 04/29/2016  . Gout, unspecified    foot  . HYPERLIPIDEMIA   . HYPERTENSION   . Lung mass   . NSCL ca dx'd 12/2015  . On supplemental oxygen by nasal cannula    PRN  . PEPTIC ULCER DISEASE   . Pneumonia   . TOBACCO ABUSE   . Type 2 diabetes mellitus ([place])    denies on 01-06-16    Past Surgical History:  Procedure Laterality Date  . CARDIAC CATHETERIZATION     '06-stent placed, 6'13 - Dr. Lizbeth Bark follows  . CORONARY STENT PLACEMENT     s/p  . ENDOBRONCHIAL ULTRASOUND Bilateral 01/11/2016   Procedure: ENDOBRONCHIAL ULTRASOUND;  Surgeon: Collene Gobble, MD;  Location: [facility] ENDOSCOPY;  Service: Endoscopy;  Laterality: Bilateral;  . VIDEO BRONCHOSCOPY WITH ENDOBRONCHIAL NAVIGATION Right 03/07/2018   Procedure: VIDEO BRONCHOSCOPY WITH ENDOBRONCHIAL NAVIGATION;  Surgeon: Collene Gobble, MD;  Location: [facility] OR;  Service: Thoracic;  Laterality: Right;    Social History   Socioeconomic History  . Marital status: Divorced    Spouse name: Not on file  . Number of children: 2  . Years of education: Not on file  . Highest education level: Not on file  Occupational History  . 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 Financial resource strain: Not on  file  . Food insecurity:    Worry: Not on file    Inability: Not on file  . Transportation needs:    Medical: Not on file    Non-medical: Not on file  Tobacco Use  . Smoking status: Former Smoker    Packs/day: 0.50    Years: 50.00    Pack years: 25.00    Types: Cigarettes  . Smokeless tobacco: Never Used  . Tobacco comment: stopped smoking cigarettes 2/ 2019  Substance and Sexual Activity  . Alcohol use: Not Currently    Alcohol/week: 0.0 oz  . Drug use: No  . Sexual activity: Not on file  Lifestyle  . Physical activity:    Days per week: Not on file    Minutes per session: Not on file  . Stress: Not on file  Relationships  . Social connections:    Talks on phone: Not on file    Gets together: Not on file    Attends religious service: Not on file    Active member of club or organization: Not on file    Attends meetings of clubs or organizations: Not on file    Relationship status: Not on file  . Intimate partner violence:    Fear of current or ex partner: Not on file    Emotionally abused:  Not on file    Physically abused: Not on file    Forced sexual activity: Not on file  Other Topics Concern  . Not on file  Social History Narrative   Widowed 2003    Current Outpatient Medications on File Prior to Visit  Medication Sig Dispense Refill  . acetaminophen (TYLENOL) 500 MG tablet Take 250 mg by mouth every 6 (six) hours as needed for moderate pain or headache.     . allopurinol (ZYLOPRIM) 100 MG tablet TAKE 1 TABLET BY MOUTH EVERY DAY 90 tablet 0  . aspirin 81 MG tablet Take 1 tablet (81 mg total) by mouth at bedtime. Restart on 03/09/18 30 tablet   . atorvastatin (LIPITOR) 80 MG tablet TAKE 80 MG BY MOUTH EVERY DAY 30 tablet 2  . budesonide-formoterol (SYMBICORT) 160-4.5 MCG/ACT inhaler Inhale 2 puffs into the lungs 2 (two) times daily. 1 Inhaler 0  . clopidogrel (PLAVIX) 75 MG tablet PLEASE SEE ATTACHED FOR DETAILED DIRECTIONS 30 tablet 10  . docusate sodium (COLACE)  100 MG capsule Take 100 mg by mouth at bedtime.    . donepezil (ARICEPT) 10 MG tablet Take 1 tablet (10 mg total) by mouth at bedtime. 30 tablet 11  . eplerenone (INSPRA) 25 MG tablet TAKE 1 TABLET BY MOUTH EVERY DAY 30 tablet 5  . latanoprost (XALATAN) 0.005 % ophthalmic solution Place 1 drop into both eyes at bedtime.  6  . losartan-hydrochlorothiazide (HYZAAR) 100-12.5 MG tablet TAKE 1 TABLET BY MOUTH EVERY DAY 30 tablet 8  . oxybutynin (DITROPAN) 5 MG tablet Take 5 mg by mouth at bedtime.  11  . pantoprazole (PROTONIX) 40 MG tablet TAKE 40 MG BY MOUTH EVERY DAY 90 tablet 3  . potassium chloride SA (KLOR-CON M20) 20 MEQ tablet Take 1 tablet (20 mEq total) by mouth daily. 30 tablet 11  . PROAIR HFA 108 (90 Base) MCG/ACT inhaler Inhale 2 puffs into the lungs every 6 (six) hours as needed for wheezing or shortness of breath.  0  . SYMBICORT 160-4.5 MCG/ACT inhaler INHALE 2 PUFFS BY MOUTH INTO THE LUNGS 2 (TWO) TIMES DAILY. 10.2 Inhaler 3  . Tiotropium Bromide-Olodaterol (STIOLTO RESPIMAT) 2.5-2.5 MCG/ACT AERS Inhale 2 puffs into the lungs daily. 1 Inhaler 5   No current facility-administered medications on file prior to visit.     No Known Allergies  Family History  Problem Relation Age of Onset  . Breast cancer Sister   . Cancer Brother        Uncertain type  . Arthritis Mother   . Hypertension Father   . Heart attack Father     BP (!) 110/56   Pulse (!) 122   Ht 6' (1.829 m)   Wt 201 lb (91.2 kg)   SpO2 92%   BMI 27.26 kg/m   Review of Systems No weight change.  Denies fever    Objective:   Physical Exam VITAL SIGNS:  See vs page GENERAL: no distress Pulses: foot pulses are intact bilaterally.   MSK: no deformity of the feet or ankles.  CV: 2+ bilat edema of the legs and ankles, and bilat vv's.  Skin:  no ulcer on the feet or ankles.  normal color and temp on the feet and ankles Neuro: sensation is intact to touch on the feet and ankles.  Ext: There is bilateral  onychomycosis of the toenails.     Lab Results  Component Value Date   HGBA1C 6.5 (A) 06/26/2018      Assessment &  Plan:  Abnormal CXR, new to me: recheck today.  Type 2 DM, with CAD: stable on no medication.     Subjective:   Patient here for Medicare annual wellness visit and management of other chronic and acute problems.     Risk factors: advanced age    41 of Physicians Providing Medical Care to Patient:  See "snapshot"   Activities of Daily Living: In your present state of health, do you have any difficulty performing the following activities (lives with dtr)?:  Preparing food and eating?: No  Bathing yourself: No  Getting dressed: No  Using the toilet:No  Moving around from place to place: No  In the past year have you fallen or had a near fall?:No    Home Safety: Has smoke detector and wears seat belts. No firearms.   Opioid Use: none   Diet and Exercise  Current exercise habits: pt says very little  Dietary issues discussed: pt reports a healthy diet   Depression Screen  Q1: Over the past two weeks, have you felt down, depressed or hopeless? no  Q2: Over the past two weeks, have you felt little interest or pleasure in doing things? no   The following portions of the patient's history were reviewed and updated as appropriate: allergies, current medications, past family history, past medical history, past social history, past surgical history and problem list.   Review of Systems  Denies hearing loss, and visual loss Objective:   Vision:  See [facility] today Hearing: grossly normal Body mass index:  See vs page Msk: pt easily and quickly performs "get-up-and-go" from a sitting position Cognitive Impairment Assessment: cognition, memory and judgment appear normal.  remembers 2/3 at 5 minutes (? effort).  excellent recall.  can easily read and write a sentence.  alert and oriented x 3.     Assessment:   Medicare wellness utd on preventive parameters     Plan:   During the course of the visit the patient was educated and counseled about appropriate screening and preventive services including:        Fall prevention is advised today  Diabetes screening is UTD Nutrition counseling is offered   advanced directives/end of life addressed today:  see healthcare directives hyperlink  Vaccines are updated as needed  Patient Instructions (the written plan) was given to the patient.

## 2018-06-26 NOTE — Progress Notes (Signed)
we discussed code status.  pt requests full code, but would not want to be started or maintained on artificial life-support measures if there was not a reasonable chance of recovery 

## 2018-06-28 ENCOUNTER — Telehealth: Payer: Self-pay | Admitting: Endocrinology

## 2018-06-28 NOTE — Telephone Encounter (Signed)
I called patient & went over results of chest xray with him. I also reminded him of pulmonary appointment.

## 2018-06-28 NOTE — Telephone Encounter (Signed)
Please call patient re: chest X-rays ph# [phone]

## 2018-07-04 DIAGNOSIS — C3432 Malignant neoplasm of lower lobe, left bronchus or lung: Secondary | ICD-10-CM | POA: Insufficient documentation

## 2018-07-04 NOTE — Progress Notes (Signed)
[facility] Simulation and Treatment Planning Note   Name:  Ryan Contreras MRN: [id number]   Date: 07/04/2018  DOB: 1940-12-26  Status:outpatient    DIAGNOSIS:    ICD-10-CM   1. 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  Identity was confirmed.  All relevant records and images related to the planned course of therapy were reviewed.  Then, the patient was positioned in a stable reproducible clinical set-up for radiation therapy. Abdominal compression was applied by me.  4D CT images were obtained and reproducible breathing pattern was confirmed. Free breathing CT images were obtained.  Skin markings were placed.  The CT images were loaded into the planning software where the target and avoidance structures were contoured.  The radiation prescription was entered and confirmed.    TREATMENT PLANNING NOTE:  Treatment planning then occurred. I have requested : IMRT planning.This treatment technique is medically necessary due to the high-dose of radiation delivered to the target region which is in close proximity to adjacent critical normal structures.  3 dimensional simulation is performed and dose volume histogram of the gross tumor volume, planning tumor volume and criticial normal structures including the spinal cord and lungs were analyzed and requested.  Special treatment procedure was performed due to high dose per fraction.  The patient will be monitored for increased risk of toxicity.  Daily imaging using cone beam CT will be used for target localization.  I anticipate that the patient will receive 60 Gy in 5  fractions to target volume within the left lower lobe and the patient will receive 40 Gray in 5 fractions to the right upper lobe which corresponds to a course of reirradiation. Further adjustments will be made based on the planning process is necessary.  ------------------------------------------------  Jodelle Gross, MD, PhD

## 2018-07-04 NOTE — Progress Notes (Signed)
  Radiation Oncology         [phone] ________________________________  Name: Ryan Contreras MRN: [id number]  Date: 05/18/2018  DOB: 19-Nov-1941  RESPIRATORY MOTION MANAGEMENT SIMULATION  NARRATIVE:  In order to account for effect of respiratory motion on target structures and other organs in the planning and delivery of radiotherapy, this patient underwent respiratory motion management simulation.  To accomplish this, when the patient was brought to the CT simulation planning suite, 4D respiratoy motion management CT images were obtained.  The CT images were loaded into the planning software.  Then, using a variety of tools including Cine, MIP, and standard views, the target volume and planning target volumes (PTV) were delineated.  Avoidance structures were contoured.  Treatment planning then occurred.  Dose volume histograms were generated and reviewed for each of the requested structure.  The resulting plan was carefully reviewed and approved today.   ------------------------------------------------  Jodelle Gross, MD, PhD

## 2018-07-04 NOTE — Addendum Note (Signed)
Encounter addended by: Kyung Rudd, MD on: 07/04/2018 11:30 AM  Actions taken: Sign clinical note

## 2018-07-10 ENCOUNTER — Telehealth: Payer: Self-pay | Admitting: *Deleted

## 2018-07-10 NOTE — Telephone Encounter (Signed)
Oncology Nurse Navigator Documentation  Oncology Nurse Navigator Flowsheets 07/10/2018  Navigator Location [facility]  Navigator Encounter Type Telephone/I followed up on Ryan Contreras schedule.  He needs CT chest before he sees Dr. Julien Nordmann.  I updated him and gave him the number to scheduling to call to make an appt for his scan.   Telephone Outgoing Call  Treatment Phase Follow-up  Barriers/Navigation Needs Education;Coordination of Care  Education Other  Interventions Coordination of Care;Education  Coordination of Care Other  Education Method Verbal  Acuity Level 1  Time Spent with Patient 15

## 2018-07-13 ENCOUNTER — Inpatient Hospital Stay: Payer: Medicare Other | Attending: Internal Medicine

## 2018-07-13 DIAGNOSIS — C3491 Malignant neoplasm of unspecified part of right bronchus or lung: Secondary | ICD-10-CM

## 2018-07-13 DIAGNOSIS — Z923 Personal history of irradiation: Secondary | ICD-10-CM | POA: Insufficient documentation

## 2018-07-13 DIAGNOSIS — C3411 Malignant neoplasm of upper lobe, right bronchus or lung: Secondary | ICD-10-CM | POA: Insufficient documentation

## 2018-07-13 DIAGNOSIS — Z9221 Personal history of antineoplastic chemotherapy: Secondary | ICD-10-CM | POA: Diagnosis not present

## 2018-07-13 DIAGNOSIS — Z85118 Personal history of other malignant neoplasm of bronchus and lung: Secondary | ICD-10-CM | POA: Insufficient documentation

## 2018-07-13 LAB — CBC WITH DIFFERENTIAL (CANCER CENTER ONLY)
BASOS PCT: 1 %
Basophils Absolute: 0.1 10*3/uL (ref 0.0–0.1)
EOS ABS: 0.3 10*3/uL (ref 0.0–0.5)
EOS PCT: 5 %
HCT: 32.8 % — ABNORMAL LOW (ref 38.4–49.9)
Hemoglobin: 10.6 g/dL — ABNORMAL LOW (ref 13.0–17.1)
LYMPHS ABS: 1.3 10*3/uL (ref 0.9–3.3)
Lymphocytes Relative: 18 %
MCH: 29.1 pg (ref 27.2–33.4)
MCHC: 32.3 g/dL (ref 32.0–36.0)
MCV: 89.9 fL (ref 79.3–98.0)
Monocytes Absolute: 1.2 10*3/uL — ABNORMAL HIGH (ref 0.1–0.9)
Monocytes Relative: 17 %
Neutro Abs: 4.3 10*3/uL (ref 1.5–6.5)
Neutrophils Relative %: 59 %
PLATELETS: 383 10*3/uL (ref 140–400)
RBC: 3.65 MIL/uL — AB (ref 4.20–5.82)
RDW: 18 % — ABNORMAL HIGH (ref 11.0–14.6)
WBC Count: 7.2 10*3/uL (ref 4.0–10.3)

## 2018-07-13 LAB — CMP (CANCER CENTER ONLY)
ALK PHOS: 93 U/L (ref 38–126)
ALT: 13 U/L (ref 0–44)
AST: 17 U/L (ref 15–41)
Albumin: 2.9 g/dL — ABNORMAL LOW (ref 3.5–5.0)
Anion gap: 11 (ref 5–15)
BILIRUBIN TOTAL: 0.3 mg/dL (ref 0.3–1.2)
BUN: 15 mg/dL (ref 8–23)
CALCIUM: 9 mg/dL (ref 8.9–10.3)
CO2: 30 mmol/L (ref 22–32)
CREATININE: 0.89 mg/dL (ref 0.61–1.24)
Chloride: 99 mmol/L (ref 98–111)
GFR, Est AFR Am: >60 mL/min (ref >60)
GFR, Estimated: >60 mL/min (ref >60)
Glucose, Bld: 135 mg/dL — ABNORMAL HIGH (ref 70–99)
Potassium: 4 mmol/L (ref 3.5–5.1)
Sodium: 140 mmol/L (ref 135–145)
Total Protein: 8.4 g/dL — ABNORMAL HIGH (ref 6.5–8.1)

## 2018-07-16 ENCOUNTER — Ambulatory Visit (HOSPITAL_COMMUNITY)
Admission: RE | Admit: 2018-07-16 | Discharge: 2018-07-16 | Disposition: A | Discharge status: Home / Self Care | Payer: Medicare Other | Source: Ambulatory Visit | Attending: Internal Medicine | Admitting: Internal Medicine

## 2018-07-16 ENCOUNTER — Encounter (HOSPITAL_COMMUNITY): Payer: Self-pay

## 2018-07-16 DIAGNOSIS — I2510 Atherosclerotic heart disease of native coronary artery without angina pectoris: Secondary | ICD-10-CM | POA: Diagnosis not present

## 2018-07-16 DIAGNOSIS — I700 Atherosclerosis of aorta: Secondary | ICD-10-CM | POA: Insufficient documentation

## 2018-07-16 DIAGNOSIS — J984 Other disorders of lung: Secondary | ICD-10-CM | POA: Diagnosis not present

## 2018-07-16 DIAGNOSIS — J432 Centrilobular emphysema: Secondary | ICD-10-CM | POA: Diagnosis not present

## 2018-07-16 DIAGNOSIS — C3490 Malignant neoplasm of unspecified part of unspecified bronchus or lung: Secondary | ICD-10-CM | POA: Diagnosis not present

## 2018-07-16 MED ORDER — IOHEXOL 300 MG/ML  SOLN
75.0000 mL | Freq: Once | INTRAMUSCULAR | Status: AC | PRN
  Administered 2018-07-16: 75 mL via INTRAVENOUS

## 2018-07-17 ENCOUNTER — Inpatient Hospital Stay (HOSPITAL_BASED_OUTPATIENT_CLINIC_OR_DEPARTMENT_OTHER): Payer: Medicare Other | Admitting: Internal Medicine

## 2018-07-17 ENCOUNTER — Telehealth: Payer: Self-pay

## 2018-07-17 ENCOUNTER — Encounter: Payer: Self-pay | Admitting: Internal Medicine

## 2018-07-17 DIAGNOSIS — Z85118 Personal history of other malignant neoplasm of bronchus and lung: Secondary | ICD-10-CM

## 2018-07-17 DIAGNOSIS — Z9221 Personal history of antineoplastic chemotherapy: Secondary | ICD-10-CM

## 2018-07-17 DIAGNOSIS — Z923 Personal history of irradiation: Secondary | ICD-10-CM | POA: Diagnosis not present

## 2018-07-17 DIAGNOSIS — C3411 Malignant neoplasm of upper lobe, right bronchus or lung: Secondary | ICD-10-CM

## 2018-07-17 DIAGNOSIS — C3490 Malignant neoplasm of unspecified part of unspecified bronchus or lung: Secondary | ICD-10-CM

## 2018-07-17 NOTE — Progress Notes (Signed)
[name] [phone]   [phone]  OFFICE PROGRESS NOTE  Renato Shin, MD [address] [place] [address]  DIAGNOSIS: Recurrent non-small cell lung cancer, squamous cell carcinoma initially presented as unresectable Stage IIA (T1b, N1, M0) non-small cell lung cancer, squamous cell carcinoma presented with right upper lobe lung nodule and hilar lymphadenopathy diagnosed in January 2017.  PRIOR THERAPY:  1) Course of concurrent chemoradiation with weekly carboplatin for AUC of 2 and paclitaxel 45 MG/M2. Status post 7 cycles, last dose was given 03/14/2016 with partial response. 2 status post stereotactic radiotherapy to recurrent lung cancer in the right upper lobe under the care of Dr. Lisbeth Renshaw completed on 06/08/2018  CURRENT THERAPY: Observation.  INTERVAL HISTORY: Ryan Contreras 77 y.o. male returns to the clinic today for follow-up visit accompanied by his wife.  The patient is feeling fine today with no specific complaints except for dry cough.  He denied having any chest pain but has mild shortness of breath with exertion with no hemoptysis.  He denied having any weight loss or night sweats.  He has no nausea, vomiting, diarrhea or constipation.  He denied having any fever or chills.  The patient tolerated the previous course of stereotactic radiotherapy fairly well.  He has repeat CT scan of the chest performed recently and he is here for evaluation and discussion of his discuss results.   MEDICAL HISTORY: Past Medical History:  Diagnosis Date  . Arthritis   . CAD, NATIVE VESSEL    coronary stent x1-tx. Plavix use.  Marland Kitchen COPD (chronic obstructive pulmonary disease) ([place])    due to 50+ years Smoking.  Marland Kitchen Dyspnea    wears oxygen PRN  . Gastrointestinal bleeding, upper 04/29/2016  . Gout, unspecified    foot  . HYPERLIPIDEMIA   . HYPERTENSION   . Lung mass   . NSCL ca dx'd 12/2015  . On supplemental oxygen by nasal cannula     PRN  . PEPTIC ULCER DISEASE   . Pneumonia   . TOBACCO ABUSE   . Type 2 diabetes mellitus ([place])    denies on 01-06-16    ALLERGIES:  has No Known Allergies.  MEDICATIONS:  Current Outpatient Medications  Medication Sig Dispense Refill  . acetaminophen (TYLENOL) 500 MG tablet Take 250 mg by mouth every 6 (six) hours as needed for moderate pain or headache.     . allopurinol (ZYLOPRIM) 100 MG tablet TAKE 1 TABLET BY MOUTH EVERY DAY 90 tablet 0  . aspirin 81 MG tablet Take 1 tablet (81 mg total) by mouth at bedtime. Restart on 03/09/18 30 tablet   . atorvastatin (LIPITOR) 80 MG tablet TAKE 80 MG BY MOUTH EVERY DAY 30 tablet 2  . budesonide-formoterol (SYMBICORT) 160-4.5 MCG/ACT inhaler Inhale 2 puffs into the lungs 2 (two) times daily. 1 Inhaler 0  . clopidogrel (PLAVIX) 75 MG tablet PLEASE SEE ATTACHED FOR DETAILED DIRECTIONS 30 tablet 10  . docusate sodium (COLACE) 100 MG capsule Take 100 mg by mouth at bedtime.    . donepezil (ARICEPT) 10 MG tablet Take 1 tablet (10 mg total) by mouth at bedtime. 30 tablet 11  . eplerenone (INSPRA) 25 MG tablet TAKE 1 TABLET BY MOUTH EVERY DAY 30 tablet 5  . latanoprost (XALATAN) 0.005 % ophthalmic solution Place 1 drop into both eyes at bedtime.  6  . losartan-hydrochlorothiazide (HYZAAR) 100-12.5 MG tablet TAKE 1 TABLET BY MOUTH EVERY DAY 30 tablet 8  .  memantine (NAMENDA) 5 MG tablet Take 1 tablet (5 mg total) by mouth 2 (two) times daily. 60 tablet 11  . oxybutynin (DITROPAN) 5 MG tablet Take 5 mg by mouth at bedtime.  11  . pantoprazole (PROTONIX) 40 MG tablet TAKE 40 MG BY MOUTH EVERY DAY 90 tablet 3  . potassium chloride SA (KLOR-CON M20) 20 MEQ tablet Take 1 tablet (20 mEq total) by mouth daily. 30 tablet 11  . PROAIR HFA 108 (90 Base) MCG/ACT inhaler Inhale 2 puffs into the lungs every 6 (six) hours as needed for wheezing or shortness of breath.  0  . SYMBICORT 160-4.5 MCG/ACT inhaler INHALE 2 PUFFS BY MOUTH INTO THE LUNGS 2 (TWO) TIMES DAILY.  10.2 Inhaler 3  . Tiotropium Bromide-Olodaterol (STIOLTO RESPIMAT) 2.5-2.5 MCG/ACT AERS Inhale 2 puffs into the lungs daily. 1 Inhaler 5   No current facility-administered medications for this visit.     SURGICAL HISTORY:  Past Surgical History:  Procedure Laterality Date  . CARDIAC CATHETERIZATION     '06-stent placed, 6'13 - Dr. Lizbeth Bark follows  . CORONARY STENT PLACEMENT     s/p  . ENDOBRONCHIAL ULTRASOUND Bilateral 01/11/2016   Procedure: ENDOBRONCHIAL ULTRASOUND;  Surgeon: Collene Gobble, MD;  Location: [facility];  Service: Endoscopy;  Laterality: Bilateral;  . VIDEO BRONCHOSCOPY WITH ENDOBRONCHIAL NAVIGATION Right 03/07/2018   Procedure: VIDEO BRONCHOSCOPY WITH ENDOBRONCHIAL NAVIGATION;  Surgeon: Collene Gobble, MD;  Location: [facility] OR;  Service: Thoracic;  Laterality: Right;    REVIEW OF SYSTEMS:  A comprehensive review of systems was negative except for: Respiratory: positive for cough   PHYSICAL EXAMINATION: General appearance: alert, cooperative and no distress Head: Normocephalic, without obvious abnormality, atraumatic Neck: no adenopathy, no JVD, supple, symmetrical, trachea midline and thyroid not enlarged, symmetric, no tenderness/mass/nodules Lymph nodes: Cervical, supraclavicular, and axillary nodes normal. Resp: clear to auscultation bilaterally Back: symmetric, no curvature. ROM normal. No CVA tenderness. Cardio: regular rate and rhythm, S1, S2 normal, no murmur, click, rub or gallop GI: soft, non-tender; bowel sounds normal; no masses,  no organomegaly Extremities: extremities normal, atraumatic, no cyanosis or edema  ECOG PERFORMANCE STATUS: 1 - Symptomatic but completely ambulatory  Blood pressure 113/63, pulse (!) 114, temperature 98.6 F (37 C), temperature source Oral, resp. rate 18, height 6' (1.829 m), weight 204 lb 6.4 oz (92.7 kg), SpO2 94 %.  LABORATORY DATA: Lab Results  Component Value Date   WBC 7.2 07/13/2018   HGB 10.6 (L) 07/13/2018   HCT  32.8 (L) 07/13/2018   MCV 89.9 07/13/2018   PLT 383 07/13/2018      Chemistry      Component Value Date/Time   NA 140 07/13/2018 0952   NA 141 06/16/2017 0906   K 4.0 07/13/2018 0952   K 3.4 (L) 06/16/2017 0906   CL 99 07/13/2018 0952   CO2 30 07/13/2018 0952   CO2 35 (H) 06/16/2017 0906   BUN 15 07/13/2018 0952   BUN 10.3 06/16/2017 0906   CREATININE 0.89 07/13/2018 0952   CREATININE 0.8 06/16/2017 0906      Component Value Date/Time   CALCIUM 9.0 07/13/2018 0952   CALCIUM 9.3 06/16/2017 0906   ALKPHOS 93 07/13/2018 0952   ALKPHOS 87 06/16/2017 0906   AST 17 07/13/2018 0952   AST 21 06/16/2017 0906   ALT 13 07/13/2018 0952   ALT 15 06/16/2017 0906   BILITOT 0.3 07/13/2018 0952   BILITOT 0.46 06/16/2017 0906       RADIOGRAPHIC STUDIES: Dg  Chest 2 View  Result Date: 06/26/2018 CLINICAL DATA:  Cough. EXAM: CHEST - 2 VIEW COMPARISON:  06/12/2018.  CT 02/12/2018. FINDINGS: Right suprahilar mass/atelectasis/infiltrate again noted. Slight increased atelectasis noted on today's exam. No pleural effusion or pneumothorax. Stable cardiomegaly. No acute bony abnormality. IMPRESSION: Persistent right suprahilar mass/atelectasis/infiltrate again noted. Slight increase in atelectasis noted on today's exam. Electronically Signed   By: Marcello Moores  Register   On: 06/26/2018 10:37   Ct Chest W Contrast  Result Date: 07/16/2018 CLINICAL DATA:  Follow-up lung cancer recurrent metastatic stage IIA disease. Squamous cell carcinoma of the right upper lobe and new ground-glass nodule in left lower lobe. EXAM: CT CHEST WITH CONTRAST TECHNIQUE: Multidetector CT imaging of the chest was performed during intravenous contrast administration. CONTRAST:  41mL OMNIPAQUE IOHEXOL 300 MG/ML  SOLN COMPARISON:  PET-CT from 04/11/2018 FINDINGS: Cardiovascular: The heart size appears within normal limits. Aortic atherosclerosis. Calcifications within all 3 coronary arteries noted. No pericardial effusion.  Mediastinum/Nodes: Normal appearance of the thyroid gland. The trachea appears patent and is midline. Normal appearance of the esophagus. No enlarged axillary or supraclavicular lymph nodes. No mediastinal or hilar adenopathy. Lungs/Pleura: No pleural effusion. Moderate changes of centrilobular emphysema with diffuse bronchial wall thickening. There is progressive mass-like architectural distortion and consolidation involving the anterior basal right upper lobe. On today's study this measures 5.0 x 7.8 cm. On PET-CT from 04/11/2018 this area measured 3.3 x 3.0 cm. Margins of underlying tumor are difficult to identify separate from what is presumed to represent changes due to external beam radiation and postobstructive pneumonitis/consolidation, image 58/5. Within the central left lower lobe there is a sub solid lesion which measures 2.2 by 1.4 cm, image 71/5. Unchanged from previous exam. No new pulmonary nodules or masses identified. Upper Abdomen: No acute findings identified within the upper abdomen. Musculoskeletal: No chest wall abnormality. No acute or significant osseous findings. IMPRESSION: 1. There has been interval worsening mass-like architectural distortion and consolidation involving the anterior right upper lobe. Findings may reflect progression of tumor and/or changes associated with external beam radiation. The margins of underlying tumor are difficult to distinguish separate from what may represent changes from external beam radiation and postobstructive pneumonitis. 2. Stable sub solid lesion within the central left lower lobe. 3. Aortic Atherosclerosis (ICD10-I70.0) and Emphysema (ICD10-J43.9). 4. Multi vessel coronary artery atherosclerotic calcifications. Electronically Signed   By: Kerby Moors M.D.   On: 07/16/2018 09:01    ASSESSMENT AND PLAN:  This is a very pleasant 77 years old African-American male with recurrent non-small cell lung cancer, squamous cell carcinoma presented  initially as unresectable a stage IIA non-small cell lung cancer, squamous cell carcinoma status post a course of concurrent chemoradiation.  The patient has been in observation since April 2017. Repeat CT scan of the chest showed enlargement of right upper lobe lung nodule suspicious for disease recurrence. A PET scan performed recently showed findings consistent with lung cancer recurrence with in the central right upper lobe as well as new ground glass nodule in the left lower lobe with low but measurable metabolic activity concerning for low-grade adenocarcinoma. The patient underwent repeat bronchoscopy and biopsy of the right upper lobe was consistent with squamous cell carcinoma.  The left lower lobe biopsy showed malignant cells. The patient underwent a stereotactic radiotherapy to the recurrent lung cancer under the care of Dr. Lisbeth Renshaw.  Repeat CT scan of the chest showed progressive enlargement of the masslike distortion and consolidation in the anterior right upper lobe.  This  is could be tumor progression but changes secondary to external beam radiation is a possibility.  The patient is currently asymptomatic. I recommended for him to continue on observation with repeat CT scan of the chest in 3 months for further evaluation of his disease and to rule out any disease progression. The patient was advised to call immediately if he has any concerning symptoms in the interval. The patient voices understanding of current disease status and treatment options and is in agreement with the current care plan. All questions were answered. The patient knows to call the clinic with any problems, questions or concerns. We can certainly see the patient much sooner if necessary.  Disclaimer: This note was dictated with voice recognition software. Similar sounding words can inadvertently be transcribed and may be missed upon review.

## 2018-07-17 NOTE — Telephone Encounter (Signed)
Printed avs and calender of upcoming apointment. Per 8/6 los also gave patient information concerning his CT.

## 2018-07-23 ENCOUNTER — Ambulatory Visit (INDEPENDENT_AMBULATORY_CARE_PROVIDER_SITE_OTHER): Payer: Medicare Other | Admitting: Emergency Medicine

## 2018-07-23 ENCOUNTER — Encounter: Payer: Self-pay | Admitting: Emergency Medicine

## 2018-07-23 DIAGNOSIS — Z23 Encounter for immunization: Secondary | ICD-10-CM

## 2018-07-23 DIAGNOSIS — C3411 Malignant neoplasm of upper lobe, right bronchus or lung: Secondary | ICD-10-CM

## 2018-07-23 DIAGNOSIS — J449 Chronic obstructive pulmonary disease, unspecified: Secondary | ICD-10-CM

## 2018-07-23 NOTE — Assessment & Plan Note (Signed)
He has undergone stereotactic radiation after identification of recurrent disease in the right upper lobe and left lower lobe.  His most recent CT scan of the chest showed some scarring in the right upper lobe, no significant change in the left lower lobe.  He is following with Dr. Julien Nordmann with serial scans.  Currently on observation.

## 2018-07-23 NOTE — Progress Notes (Signed)
Subjective:    Patient ID: Ryan Contreras, male    DOB: February 08, 1941, 77 y.o.   MRN: [id number]  HPI  77 year old gentleman, former smoker, COPD, whom I have met before in 2017.  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  He underwent concurrent chemoradiation and was followed by Dr. Julien Nordmann.  He has been followed with serial imaging.  He had a CT scan on 12/18/17 that I reviewed that showed possible local recurrence in the central aspect of the right upper lobe with an enlarging nodule.  Also noted was enlarging mixed solid and sub-solid lesion in the superior segment of the left lower lobe.  PET scan was done on 01/08/18 that I reviewed and which confirmed hypermetabolism of the right upper lobe nodule, low metabolic activity (SUV max 2.7) in the left lower lobe mixed solid/groundglass lesion.  There was no evidence of mediastinal lymphadenopathy or hypermetabolism.  Also noted with some hypermetabolism around the prostate.  A PSA done on 12/27/17 was elevated at 13.9.               ROV 03/15/18 --patient returns today for continued evaluation of his COPD, recurrence of his non-small cell lung cancer, squamous cell.  He underwent navigational bronchoscopy since our last visit to evaluate an enlarging right perihilar nodule as well as a less well-formed left lower lobe nodular infiltrate.  The right upper lobe nodule was consistent with squamous cell lung cancer.  The left-sided biopsies were negative but the brushing did show malignant cells.  He saw Dr. Julien Nordmann on 03/13/18 to discuss options for therapy and has decided to pursue possible stereotactic XRT.  The other option would be chemotherapy in addition to [facility].  He restarted his Plavix without incident.  He reports that he has felt well, still gets exertional SOB when walking 50-75 ft. Has to stop when doing housework. He coughs daily, clear mucous. '' He is currently on  Symbicort, uses albuterol approximately about 2x a day.  He has o2 that he uses at night  ROV 07/23/18 --77 year old gentleman with a history of COPD and recurrent squamous cell lung cancer.  He underwent stereotactic radiation in June 2019, is being followed by Dr. Julien Nordmann and is currently on observation.  At his last visit we tried changing symbicort to stiolto. He continues to have exertional SOB, has to rest after short walk. He has daily cough, mostly non-prod, but daily produces some clear to yellow. He hears some wheeze. He uses his albuterol on a bid schedule. He may actually be doing his stiolto on a bid schedule as well. He has O2 at night, has tanks for exertion but doesn't like to use them  Last ct was reviewed by me from 07/17/18, shows anterior right upper lobe distortion and consolidation suggestive of radiation change.  The tumor itself is difficult to identify.  Left lower lobe lesion is stable in size                    Review of Systems     Objective:   Physical Exam Vitals:   07/23/18 0907  BP: 110/66  Pulse: (!) 105  SpO2: 91%  Weight: 205 lb (93 kg)  Height: '6\' 1"'  (1.854 m)   Gen: Pleasant, well-nourished, in no distress,  normal affect  ENT: No lesions,  mouth clear,  oropharynx clear, no postnasal drip, poor dentition  Neck: No JVD, no stridor  Lungs: No use of accessory muscles, very distant, no wheezing or crackles.   Cardiovascular: RRR, heart sounds normal, no murmur or gallops, trace peripheral edema  Musculoskeletal: No deformities, no cyanosis or clubbing  Neuro: alert, non focal  Skin: Warm, no lesions or rash     Assessment & Plan:  COPD  GOLD C He does have limiting symptoms.  He believes his benefited from the change to [employer].  He is taking it 1 puff twice a day and I will ask him to change to 2 puffs once a day.  He uses albuterol twice a day on a schedule and I have explained to him the concept of changing this to as needed.  He has oxygen  available for exertion but does not use it reliably.  We talked about better compliance.  He is due for Pneumovax, Prevnar 13 is up-to-date.  Please continue Stiolto.  Use 2 puffs once daily. Keep albuterol available to use 2 puffs if needed shortness of breath, chest tightness, wheezing. Continue your oxygen at night.  You would probably benefit from using your oxygen with exertion more reliably. Get the flu shot in the fall. You are due for the Pneumovax pneumonia shot. Follow with Dr Lamonte Sakai in 6 months or sooner if you have any problems  Malignant neoplasm of right upper lobe of lung [facility]) He has undergone stereotactic radiation after identification of recurrent disease in the right upper lobe and left lower lobe.  His most recent CT scan of the chest showed some scarring in the right upper lobe, no significant change in the left lower lobe.  He is following with Dr. Julien Nordmann with serial scans.  Currently on observation.  Baltazar Apo, MD, PhD 07/23/2018, 9:33 AM [facility] [phone] or if no answer [phone]

## 2018-07-23 NOTE — Assessment & Plan Note (Signed)
He does have limiting symptoms.  He believes his benefited from the change to [employer].  He is taking it 1 puff twice a day and I will ask him to change to 2 puffs once a day.  He uses albuterol twice a day on a schedule and I have explained to him the concept of changing this to as needed.  He has oxygen available for exertion but does not use it reliably.  We talked about better compliance.  He is due for Pneumovax, Prevnar 13 is up-to-date.  Please continue Stiolto.  Use 2 puffs once daily. Keep albuterol available to use 2 puffs if needed shortness of breath, chest tightness, wheezing. Continue your oxygen at night.  You would probably benefit from using your oxygen with exertion more reliably. Get the flu shot in the fall. You are due for the Pneumovax pneumonia shot. Follow with Dr Lamonte Sakai in 6 months or sooner if you have any problems

## 2018-07-23 NOTE — Patient Instructions (Addendum)
Please continue Stiolto.  Use 2 puffs once daily. Keep albuterol available to use 2 puffs if needed shortness of breath, chest tightness, wheezing. Continue your oxygen at night.  You would probably benefit from using your oxygen with exertion more reliably. Get the flu shot in the fall. You are due for the Pneumovax pneumonia shot. Follow with Dr. Julien Nordmann and follow your CT scan of the chest as planned. Follow with Dr Lamonte Sakai in 6 months or sooner if you have any problems

## 2018-07-25 ENCOUNTER — Telehealth: Payer: Self-pay | Admitting: Radiation Oncology

## 2018-07-25 ENCOUNTER — Telehealth: Payer: Self-pay | Admitting: Endocrinology

## 2018-07-25 ENCOUNTER — Ambulatory Visit: Admission: RE | Admit: 2018-07-25 | Payer: Medicare Other | Source: Ambulatory Visit | Admitting: Radiation Oncology

## 2018-07-25 NOTE — Telephone Encounter (Signed)
I called the patient to review his discussion with Dr. Julien Nordmann. He will be due for repeat scan in November. He is having DOE and just saw Dr. Lamonte Sakai this week with medication changes for this. He does not appear to have other symptoms to indicate clinical radiation pneumonitis, but if he notes these, he is to call. Otherwise we can forgo his appt today.

## 2018-07-25 NOTE — Telephone Encounter (Signed)
[place] is calling to check if we have received surgery clearance form that they faxed over yesterday morning      Caryl Pina [phone] Ex [phone]

## 2018-07-25 NOTE — Telephone Encounter (Signed)
Dr. Loanne Drilling is requesting that pt be seen on 07/27/18 @2 :15 in order to sign paperwork, please schedule pt

## 2018-07-27 ENCOUNTER — Encounter: Payer: Self-pay | Admitting: Endocrinology

## 2018-07-27 ENCOUNTER — Ambulatory Visit (INDEPENDENT_AMBULATORY_CARE_PROVIDER_SITE_OTHER): Payer: Medicare Other | Admitting: Endocrinology

## 2018-07-27 VITALS — BP 126/72 | HR 112 | Temp 98.6°F | Wt 205.0 lb

## 2018-07-27 DIAGNOSIS — Z0000 Encounter for general adult medical examination without abnormal findings: Secondary | ICD-10-CM | POA: Diagnosis not present

## 2018-07-27 NOTE — Progress Notes (Signed)
Subjective:    Patient ID: Ryan Contreras, male    DOB: 09/28/1941, 77 y.o.   MRN: [id number]  HPI Pt will have cataract surgery in 3 days.   HTN: he still has doe, but no orthopnea.  CAD: he denies chest pain.  Lung cancer: he has slight prod cough.   Past Medical History:  Diagnosis Date  . Arthritis   . CAD, NATIVE VESSEL    coronary stent x1-tx. Plavix use.  Marland Kitchen COPD (chronic obstructive pulmonary disease) ([place])    due to 50+ years Smoking.  Marland Kitchen Dyspnea    wears oxygen PRN  . Gastrointestinal bleeding, upper 04/29/2016  . Gout, unspecified    foot  . HYPERLIPIDEMIA   . HYPERTENSION   . Lung mass   . NSCL ca dx'd 12/2015  . On supplemental oxygen by nasal cannula    PRN  . PEPTIC ULCER DISEASE   . Pneumonia   . TOBACCO ABUSE   . Type 2 diabetes mellitus ([place])    denies on 01-06-16    Past Surgical History:  Procedure Laterality Date  . CARDIAC CATHETERIZATION     '06-stent placed, 6'13 - Dr. Lizbeth Bark follows  . CORONARY STENT PLACEMENT     s/p  . ENDOBRONCHIAL ULTRASOUND Bilateral 01/11/2016   Procedure: ENDOBRONCHIAL ULTRASOUND;  Surgeon: Collene Gobble, MD;  Location: [facility];  Service: Endoscopy;  Laterality: Bilateral;  . VIDEO BRONCHOSCOPY WITH ENDOBRONCHIAL NAVIGATION Right 03/07/2018   Procedure: VIDEO BRONCHOSCOPY WITH ENDOBRONCHIAL NAVIGATION;  Surgeon: Collene Gobble, MD;  Location: [facility];  Service: Thoracic;  Laterality: Right;    Social History   Socioeconomic History  . Marital status: Divorced    Spouse name: Not on file  . Number of children: 2  . Years of education: Not on file  . Highest education level: Not on file  Occupational History  . 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 Financial resource strain: Not on file  . Food insecurity:    Worry: Not on file    Inability: Not on file  . Transportation needs:    Medical: Not on file    Non-medical: Not on file  Tobacco Use  . Smoking status: Former Smoker    Packs/day: 0.50      Years: 50.00    Pack years: 25.00    Types: Cigarettes    Last attempt to quit: 01/12/2018    Years since quitting: 0.5  . Smokeless tobacco: Never Used  . Tobacco comment: stopped smoking cigarettes 2/ 2019  Substance and Sexual Activity  . Alcohol use: Not Currently    Alcohol/week: 0.0 standard drinks  . Drug use: No  . Sexual activity: Not on file  Lifestyle  . Physical activity:    Days per week: Not on file    Minutes per session: Not on file  . Stress: Not on file  Relationships  . Social connections:    Talks on phone: Not on file    Gets together: Not on file    Attends religious service: Not on file    Active member of club or organization: Not on file    Attends meetings of clubs or organizations: Not on file    Relationship status: Not on file  . Intimate partner violence:    Fear of current or ex partner: Not on file    Emotionally abused: Not on file    Physically abused: Not on file    Forced sexual activity: Not  on file  Other Topics Concern  . Not on file  Social History Narrative   Widowed 2003    Current Outpatient Medications on File Prior to Visit  Medication Sig Dispense Refill  . acetaminophen (TYLENOL) 500 MG tablet Take 250 mg by mouth every 6 (six) hours as needed for moderate pain or headache.     . allopurinol (ZYLOPRIM) 100 MG tablet TAKE 1 TABLET BY MOUTH EVERY DAY 90 tablet 0  . aspirin 81 MG tablet Take 1 tablet (81 mg total) by mouth at bedtime. Restart on 03/09/18 30 tablet   . atorvastatin (LIPITOR) 80 MG tablet TAKE 80 MG BY MOUTH EVERY DAY 30 tablet 2  . clopidogrel (PLAVIX) 75 MG tablet PLEASE SEE ATTACHED FOR DETAILED DIRECTIONS 30 tablet 10  . docusate sodium (COLACE) 100 MG capsule Take 100 mg by mouth at bedtime.    . donepezil (ARICEPT) 10 MG tablet Take 1 tablet (10 mg total) by mouth at bedtime. 30 tablet 11  . eplerenone (INSPRA) 25 MG tablet TAKE 1 TABLET BY MOUTH EVERY DAY 30 tablet 5  . latanoprost (XALATAN) 0.005 %  ophthalmic solution Place 1 drop into both eyes at bedtime.  6  . losartan-hydrochlorothiazide (HYZAAR) 100-12.5 MG tablet TAKE 1 TABLET BY MOUTH EVERY DAY 30 tablet 8  . memantine (NAMENDA) 5 MG tablet Take 1 tablet (5 mg total) by mouth 2 (two) times daily. 60 tablet 11  . oxybutynin (DITROPAN) 5 MG tablet Take 5 mg by mouth at bedtime.  11  . pantoprazole (PROTONIX) 40 MG tablet TAKE 40 MG BY MOUTH EVERY DAY 90 tablet 3  . potassium chloride SA (KLOR-CON M20) 20 MEQ tablet Take 1 tablet (20 mEq total) by mouth daily. 30 tablet 11  . PROAIR HFA 108 (90 Base) MCG/ACT inhaler Inhale 2 puffs into the lungs every 6 (six) hours as needed for wheezing or shortness of breath.  0  . Tiotropium Bromide-Olodaterol (STIOLTO RESPIMAT) 2.5-2.5 MCG/ACT AERS Inhale 2 puffs into the lungs daily. 1 Inhaler 5   No current facility-administered medications on file prior to visit.     No Known Allergies  Family History  Problem Relation Age of Onset  . Breast cancer Sister   . Cancer Brother        Uncertain type  . Arthritis Mother   . Hypertension Father   . Heart attack Father     BP 126/72 (BP Location: Left Arm, Patient Position: Sitting, Cuff Size: Normal)   Pulse (!) 112   Temp 98.6 F (37 C)   Wt 205 lb (93 kg)   SpO2 94%   BMI 27.05 kg/m   Review of Systems Denies orthopnea and fever.      Objective:   Physical Exam VITAL SIGNS:  See vs page GENERAL: no distress LUNGS:  Clear to auscultation HEART:  Regular rate and rhythm without murmurs noted. Normal S1,S2.     Lab Results  Component Value Date   CREATININE 0.89 07/13/2018   BUN 15 07/13/2018   NA 140 07/13/2018   K 4.0 07/13/2018   CL 99 07/13/2018   CO2 30 07/13/2018   I personally reviewed electrocardiogram tracing (today): Indication: preop Impression: NSR.  No MI.  No hypertrophy.  Compared to [place], 09: sinus tachycardia is new.     CT: There has been interval worsening mass-like architectural distortion and  consolidation involving the anterior right upper lobe. Findings may reflect progression of tumor and/or changes associated with external beam radiation. The  margins of underlying tumor are difficult to distinguish separate from what may represent changes from external beam radiation and postobstructive pneumonitis     Assessment & Plan:  HTN: well-controlled CAD: stable Lung cancer: with persistent doe  His surgical risk is low and outweighed by the potential benefit of the surgery.  he is therefore medically cleared  Patient Instructions  Please continue the same medications For the surgery, you need oxygen and oxygen monitoring.  I did form, saying that.   I'll see you next time.

## 2018-07-27 NOTE — Patient Instructions (Signed)
Please continue the same medications For the surgery, you need oxygen and oxygen monitoring.  I did form, saying that.   I'll see you next time.

## 2018-07-27 NOTE — Progress Notes (Signed)
   Subjective:    Patient ID: Ryan Contreras, male    DOB: September 01, 1941, 77 y.o.   MRN: [id number]  HPI See other note   Review of Systems     Objective:   Physical Exam        Assessment & Plan:

## 2018-08-01 ENCOUNTER — Other Ambulatory Visit: Payer: Self-pay | Admitting: Urology

## 2018-08-01 DIAGNOSIS — R972 Elevated prostate specific antigen [PSA]: Secondary | ICD-10-CM

## 2018-08-15 ENCOUNTER — Ambulatory Visit
Admission: RE | Admit: 2018-08-15 | Discharge: 2018-08-15 | Disposition: A | Discharge status: Home / Self Care | Payer: Medicare Other | Source: Ambulatory Visit | Attending: Urology | Admitting: Urology

## 2018-08-15 DIAGNOSIS — R972 Elevated prostate specific antigen [PSA]: Secondary | ICD-10-CM

## 2018-08-15 MED ORDER — GADOBENATE DIMEGLUMINE 529 MG/ML IV SOLN
19.0000 mL | Freq: Once | INTRAVENOUS | Status: AC | PRN
  Administered 2018-08-15: 19 mL via INTRAVENOUS

## 2018-09-08 ENCOUNTER — Other Ambulatory Visit: Payer: Self-pay | Admitting: Endocrinology

## 2018-10-11 ENCOUNTER — Telehealth: Payer: Self-pay | Admitting: Medical Oncology

## 2018-10-11 ENCOUNTER — Other Ambulatory Visit: Payer: Self-pay | Admitting: Internal Medicine

## 2018-10-11 NOTE — Telephone Encounter (Signed)
Needs ct scheduled. Message sent to CS.

## 2018-10-15 ENCOUNTER — Inpatient Hospital Stay: Payer: Medicare Other | Attending: Internal Medicine

## 2018-10-15 ENCOUNTER — Ambulatory Visit (HOSPITAL_COMMUNITY)
Admission: RE | Admit: 2018-10-15 | Discharge: 2018-10-15 | Disposition: A | Discharge status: Home / Self Care | Payer: Medicare Other | Source: Ambulatory Visit | Attending: Internal Medicine | Admitting: Internal Medicine

## 2018-10-15 ENCOUNTER — Inpatient Hospital Stay: Payer: Medicare Other

## 2018-10-15 DIAGNOSIS — Z87891 Personal history of nicotine dependence: Secondary | ICD-10-CM | POA: Diagnosis not present

## 2018-10-15 DIAGNOSIS — C3411 Malignant neoplasm of upper lobe, right bronchus or lung: Secondary | ICD-10-CM | POA: Insufficient documentation

## 2018-10-15 DIAGNOSIS — Z7982 Long term (current) use of aspirin: Secondary | ICD-10-CM | POA: Insufficient documentation

## 2018-10-15 DIAGNOSIS — Z923 Personal history of irradiation: Secondary | ICD-10-CM | POA: Insufficient documentation

## 2018-10-15 DIAGNOSIS — J439 Emphysema, unspecified: Secondary | ICD-10-CM | POA: Diagnosis not present

## 2018-10-15 DIAGNOSIS — Z9221 Personal history of antineoplastic chemotherapy: Secondary | ICD-10-CM | POA: Insufficient documentation

## 2018-10-15 DIAGNOSIS — Z79899 Other long term (current) drug therapy: Secondary | ICD-10-CM | POA: Diagnosis not present

## 2018-10-15 DIAGNOSIS — C3490 Malignant neoplasm of unspecified part of unspecified bronchus or lung: Secondary | ICD-10-CM | POA: Diagnosis present

## 2018-10-15 DIAGNOSIS — I10 Essential (primary) hypertension: Secondary | ICD-10-CM | POA: Insufficient documentation

## 2018-10-15 LAB — CBC WITH DIFFERENTIAL (CANCER CENTER ONLY)
Abs Immature Granulocytes: 0.05 10*3/uL (ref 0.00–0.07)
BASOS ABS: 0.1 10*3/uL (ref 0.0–0.1)
BASOS PCT: 1 %
Eosinophils Absolute: 0.3 10*3/uL (ref 0.0–0.5)
Eosinophils Relative: 3 %
HCT: 35.7 % — ABNORMAL LOW (ref 39.0–52.0)
Hemoglobin: 11.1 g/dL — ABNORMAL LOW (ref 13.0–17.0)
Immature Granulocytes: 1 %
LYMPHS ABS: 1.8 10*3/uL (ref 0.7–4.0)
LYMPHS PCT: 22 %
MCH: 29.2 pg (ref 26.0–34.0)
MCHC: 31.1 g/dL (ref 30.0–36.0)
MCV: 93.9 fL (ref 80.0–100.0)
MONO ABS: 1.5 10*3/uL — AB (ref 0.1–1.0)
MONOS PCT: 18 %
NEUTROS ABS: 4.6 10*3/uL (ref 1.7–7.7)
NEUTROS PCT: 55 %
PLATELETS: 327 10*3/uL (ref 150–400)
RBC: 3.8 MIL/uL — ABNORMAL LOW (ref 4.22–5.81)
RDW: 16.3 % — AB (ref 11.5–15.5)
WBC Count: 8.3 10*3/uL (ref 4.0–10.5)
nRBC: 0 % (ref 0.0–0.2)

## 2018-10-15 LAB — CMP (CANCER CENTER ONLY)
ALT: 13 U/L (ref 0–44)
ANION GAP: 8 (ref 5–15)
AST: 22 U/L (ref 15–41)
Albumin: 3.1 g/dL — ABNORMAL LOW (ref 3.5–5.0)
Alkaline Phosphatase: 117 U/L (ref 38–126)
BUN: 13 mg/dL (ref 8–23)
CHLORIDE: 98 mmol/L (ref 98–111)
CO2: 35 mmol/L — ABNORMAL HIGH (ref 22–32)
Calcium: 9.2 mg/dL (ref 8.9–10.3)
Creatinine: 1.03 mg/dL (ref 0.61–1.24)
GFR, Est AFR Am: >60 mL/min (ref >60)
Glucose, Bld: 116 mg/dL — ABNORMAL HIGH (ref 70–99)
POTASSIUM: 4.2 mmol/L (ref 3.5–5.1)
Sodium: 141 mmol/L (ref 135–145)
TOTAL PROTEIN: 8.1 g/dL (ref 6.5–8.1)
Total Bilirubin: 0.5 mg/dL (ref 0.3–1.2)

## 2018-10-15 MED ORDER — IOHEXOL 300 MG/ML  SOLN
75.0000 mL | Freq: Once | INTRAMUSCULAR | Status: AC | PRN
  Administered 2018-10-15: 75 mL via INTRAVENOUS

## 2018-10-15 MED ORDER — SODIUM CHLORIDE 0.9 % IJ SOLN
INTRAMUSCULAR | Status: AC
  Filled 2018-10-15: qty 50

## 2018-10-17 ENCOUNTER — Encounter: Payer: Self-pay | Admitting: *Deleted

## 2018-10-17 ENCOUNTER — Inpatient Hospital Stay (HOSPITAL_BASED_OUTPATIENT_CLINIC_OR_DEPARTMENT_OTHER): Payer: Medicare Other | Admitting: Internal Medicine

## 2018-10-17 ENCOUNTER — Encounter: Payer: Self-pay | Admitting: Internal Medicine

## 2018-10-17 VITALS — BP 115/66 | HR 115 | Temp 98.6°F | Resp 18 | Ht 73.0 in | Wt 211.4 lb

## 2018-10-17 DIAGNOSIS — C3432 Malignant neoplasm of lower lobe, left bronchus or lung: Secondary | ICD-10-CM

## 2018-10-17 DIAGNOSIS — Z87891 Personal history of nicotine dependence: Secondary | ICD-10-CM

## 2018-10-17 DIAGNOSIS — Z79899 Other long term (current) drug therapy: Secondary | ICD-10-CM

## 2018-10-17 DIAGNOSIS — I10 Essential (primary) hypertension: Secondary | ICD-10-CM | POA: Diagnosis not present

## 2018-10-17 DIAGNOSIS — Z9221 Personal history of antineoplastic chemotherapy: Secondary | ICD-10-CM

## 2018-10-17 DIAGNOSIS — C3411 Malignant neoplasm of upper lobe, right bronchus or lung: Secondary | ICD-10-CM

## 2018-10-17 DIAGNOSIS — Z923 Personal history of irradiation: Secondary | ICD-10-CM | POA: Diagnosis not present

## 2018-10-17 DIAGNOSIS — C3490 Malignant neoplasm of unspecified part of unspecified bronchus or lung: Secondary | ICD-10-CM

## 2018-10-17 DIAGNOSIS — Z7982 Long term (current) use of aspirin: Secondary | ICD-10-CM

## 2018-10-17 NOTE — Progress Notes (Signed)
[facility] [phone]   Fax:([phone]) [phone]  OFFICE PROGRESS NOTE  Renato Shin, MD [address] [place] [address]  DIAGNOSIS: Recurrent non-small cell lung cancer, squamous cell carcinoma initially presented as unresectable Stage IIA (T1b, N1, M0) non-small cell lung cancer, squamous cell carcinoma presented with right upper lobe lung nodule and hilar lymphadenopathy diagnosed in January 2017.  PRIOR THERAPY:  1) Course of concurrent chemoradiation with weekly carboplatin for AUC of 2 and paclitaxel 45 MG/M2. Status post 7 cycles, last dose was given 03/14/2016 with partial response. 2) status post stereotactic radiotherapy to recurrent lung cancer in the right upper lobe under the care of Dr. Lisbeth Renshaw completed on 06/08/2018  CURRENT THERAPY: Observation.  INTERVAL HISTORY: Ryan Contreras 77 y.o. male returns to the clinic today for follow-up visit accompanied by his sister.  The patient is feeling fine today except for the baseline shortness of breath increased with exertion as well as fatigue.  He denied having any chest pain or hemoptysis.  He denied having any recent weight loss or night sweats.  He has no nausea, vomiting, diarrhea or constipation.  The patient had repeat CT scan of the chest performed recently and is here for evaluation and discussion of his scan results.   MEDICAL HISTORY: Past Medical History:  Diagnosis Date  . Arthritis   . CAD, NATIVE VESSEL    coronary stent x1-tx. Plavix use.  Marland Kitchen COPD (chronic obstructive pulmonary disease) ([place])    due to 50+ years Smoking.  Marland Kitchen Dyspnea    wears oxygen PRN  . Gastrointestinal bleeding, upper 04/29/2016  . Gout, unspecified    foot  . HYPERLIPIDEMIA   . HYPERTENSION   . Lung mass   . NSCL ca dx'd 12/2015  . On supplemental oxygen by nasal cannula    PRN  . PEPTIC ULCER DISEASE   . Pneumonia   . TOBACCO ABUSE   . Type 2 diabetes mellitus ([place])    denies on 01-06-16      ALLERGIES:  has No Known Allergies.  MEDICATIONS:  Current Outpatient Medications  Medication Sig Dispense Refill  . acetaminophen (TYLENOL) 500 MG tablet Take 250 mg by mouth every 6 (six) hours as needed for moderate pain or headache.     . allopurinol (ZYLOPRIM) 100 MG tablet TAKE 1 TABLET BY MOUTH EVERY DAY 90 tablet 0  . aspirin 81 MG tablet Take 1 tablet (81 mg total) by mouth at bedtime. Restart on 03/09/18 30 tablet   . atorvastatin (LIPITOR) 80 MG tablet TAKE 1 TABLET BY MOUTH EVERY DAY 30 tablet 10  . clopidogrel (PLAVIX) 75 MG tablet PLEASE SEE ATTACHED FOR DETAILED DIRECTIONS 30 tablet 10  . docusate sodium (COLACE) 100 MG capsule Take 100 mg by mouth at bedtime.    . donepezil (ARICEPT) 10 MG tablet Take 1 tablet (10 mg total) by mouth at bedtime. 30 tablet 11  . eplerenone (INSPRA) 25 MG tablet TAKE 1 TABLET BY MOUTH EVERY DAY 30 tablet 5  . KLOR-CON M20 20 MEQ tablet TAKE 1 TABLET BY MOUTH DAILY 30 tablet 11  . latanoprost (XALATAN) 0.005 % ophthalmic solution Place 1 drop into both eyes at bedtime.  6  . losartan-hydrochlorothiazide (HYZAAR) 100-12.5 MG tablet TAKE 1 TABLET BY MOUTH EVERY DAY 30 tablet 8  . memantine (NAMENDA) 5 MG tablet Take 1 tablet (5 mg total) by mouth 2 (two) times daily. 60 tablet 11  . oxybutynin (DITROPAN)  5 MG tablet Take 5 mg by mouth at bedtime.  11  . pantoprazole (PROTONIX) 40 MG tablet TAKE 40 MG BY MOUTH EVERY DAY 90 tablet 3  . PROAIR HFA 108 (90 Base) MCG/ACT inhaler Inhale 2 puffs into the lungs every 6 (six) hours as needed for wheezing or shortness of breath.  0  . Tiotropium Bromide-Olodaterol (STIOLTO RESPIMAT) 2.5-2.5 MCG/ACT AERS Inhale 2 puffs into the lungs daily. 1 Inhaler 5   No current facility-administered medications for this visit.     SURGICAL HISTORY:  Past Surgical History:  Procedure Laterality Date  . CARDIAC CATHETERIZATION     '06-stent placed, 6'13 - Dr. Lizbeth Bark follows  . CORONARY STENT PLACEMENT     s/p   . ENDOBRONCHIAL ULTRASOUND Bilateral 01/11/2016   Procedure: ENDOBRONCHIAL ULTRASOUND;  Surgeon: Collene Gobble, MD;  Location: [facility];  Service: Endoscopy;  Laterality: Bilateral;  . VIDEO BRONCHOSCOPY WITH ENDOBRONCHIAL NAVIGATION Right 03/07/2018   Procedure: VIDEO BRONCHOSCOPY WITH ENDOBRONCHIAL NAVIGATION;  Surgeon: Collene Gobble, MD;  Location: [facility];  Service: Thoracic;  Laterality: Right;    REVIEW OF SYSTEMS:  A comprehensive review of systems was negative except for: Constitutional: positive for fatigue Respiratory: positive for dyspnea on exertion   PHYSICAL EXAMINATION: General appearance: alert, cooperative and no distress Head: Normocephalic, without obvious abnormality, atraumatic Neck: no adenopathy, no JVD, supple, symmetrical, trachea midline and thyroid not enlarged, symmetric, no tenderness/mass/nodules Lymph nodes: Cervical, supraclavicular, and axillary nodes normal. Resp: clear to auscultation bilaterally Back: symmetric, no curvature. ROM normal. No CVA tenderness. Cardio: regular rate and rhythm, S1, S2 normal, no murmur, click, rub or gallop GI: soft, non-tender; bowel sounds normal; no masses,  no organomegaly Extremities: extremities normal, atraumatic, no cyanosis or edema  ECOG PERFORMANCE STATUS: 1 - Symptomatic but completely ambulatory  Blood pressure 115/66, pulse (!) 115, temperature 98.6 F (37 C), temperature source Oral, resp. rate 18, height 6\' 1"  (1.854 m), weight 211 lb 6.4 oz (95.9 kg), SpO2 91 %.  LABORATORY DATA: Lab Results  Component Value Date   WBC 8.3 10/15/2018   HGB 11.1 (L) 10/15/2018   HCT 35.7 (L) 10/15/2018   MCV 93.9 10/15/2018   PLT 327 10/15/2018      Chemistry      Component Value Date/Time   NA 141 10/15/2018 1414   NA 141 06/16/2017 0906   K 4.2 10/15/2018 1414   K 3.4 (L) 06/16/2017 0906   CL 98 10/15/2018 1414   CO2 35 (H) 10/15/2018 1414   CO2 35 (H) 06/16/2017 0906   BUN 13 10/15/2018 1414   BUN 10.3  06/16/2017 0906   CREATININE 1.03 10/15/2018 1414   CREATININE 0.8 06/16/2017 0906      Component Value Date/Time   CALCIUM 9.2 10/15/2018 1414   CALCIUM 9.3 06/16/2017 0906   ALKPHOS 117 10/15/2018 1414   ALKPHOS 87 06/16/2017 0906   AST 22 10/15/2018 1414   AST 21 06/16/2017 0906   ALT 13 10/15/2018 1414   ALT 15 06/16/2017 0906   BILITOT 0.5 10/15/2018 1414   BILITOT 0.46 06/16/2017 0906       RADIOGRAPHIC STUDIES: Ct Chest W Contrast  Result Date: 10/15/2018 CLINICAL DATA:  Lung cancer.  Chronic cough. EXAM: CT CHEST WITH CONTRAST TECHNIQUE: Multidetector CT imaging of the chest was performed during intravenous contrast administration. CONTRAST:  17mL OMNIPAQUE IOHEXOL 300 MG/ML  SOLN COMPARISON:  07/16/2018 FINDINGS: Cardiovascular: The heart size is normal. No substantial pericardial effusion. Coronary artery calcification  is evident. Atherosclerotic calcification is noted in the wall of the thoracic aorta. Mediastinum/Nodes: Similar appearance of small lymph nodes in the right supraclavicular region and thoracic inlet bilaterally. No mediastinal lymphadenopathy. Stable 7 mm short axis right paratracheal node. No left hilar lymphadenopathy. Soft tissue opacity in the right hilum is stable. There is no axillary lymphadenopathy. Lungs/Pleura: Centrilobular and paraseptal emphysema noted. Extensive architectural distortion/scarring noted in the upper right lung with progressive consolidative soft tissue attenuation laterally and posteriorly (image 58/5). Stable volume loss right hemithorax. 2.2 x 1.4 cm sub solid lesion medial left lower lobe measures minimally smaller today at 1.7 x 1.1 cm. No pleural effusion. Upper Abdomen: Cystic focus right posterior subdiaphragmatic space likely hepatic in origin and unchanged in the interval. Musculoskeletal: No worrisome lytic or sclerotic osseous abnormality. Midline cystic lesion superficial to the manubrium is stable and likely a sebaceous cyst.  IMPRESSION: 1. Interval progression of the soft tissue density associated with the architectural distortion and scarring in the upper right lung, imaging features raise concern for tumor progression although evolving radiation fibrosis remains a possibility. 2. Slight decrease in the sub solid lesion medial left lower lobe. 3.  Aortic Atherosclerois (ICD10-170.0) 4.  Emphysema. ([id number]) Electronically Signed   By: Misty Stanley M.D.   On: 10/15/2018 17:45    ASSESSMENT AND PLAN:  This is a very pleasant 77 years old African-American male with recurrent non-small cell lung cancer, squamous cell carcinoma presented initially as unresectable a stage IIA non-small cell lung cancer, squamous cell carcinoma status post a course of concurrent chemoradiation.  The patient has been in observation since April 2017. Repeat CT scan of the chest showed enlargement of right upper lobe lung nodule suspicious for disease recurrence. A PET scan performed recently showed findings consistent with lung cancer recurrence with in the central right upper lobe as well as new ground glass nodule in the left lower lobe with low but measurable metabolic activity concerning for low-grade adenocarcinoma. The patient underwent repeat bronchoscopy and biopsy of the right upper lobe was consistent with squamous cell carcinoma.  The left lower lobe biopsy showed malignant cells. The patient underwent a stereotactic radiotherapy to the recurrent lung cancer under the care of Dr. Lisbeth Renshaw.  The patient is currently on observation and he is feeling fine except for the shortness of breath with exertion. He had repeat CT scan of the chest performed recently.  I personally and independently reviewed the scan images and discussed the results with the patient and his sister today.  His scan showed continuous evaluation of the radiation effect on the right lung.  There was decrease in the left lung nodule compared to the previous scan. I  recommended for the patient to continue on observation and close monitoring. I will see him back for follow-up visit in 3 months for evaluation with repeat CT scan of the chest for restaging of his disease. The patient was advised to call immediately if he has any concerning symptoms in the interval. The patient voices understanding of current disease status and treatment options and is in agreement with the current care plan. All questions were answered. The patient knows to call the clinic with any problems, questions or concerns. We can certainly see the patient much sooner if necessary.  Disclaimer: This note was dictated with voice recognition software. Similar sounding words can inadvertently be transcribed and may be missed upon review.

## 2018-10-17 NOTE — Research (Signed)
Research Study "PROCUREMENT OF HUMAN BIOSPECIMENS FOR THE DISCOVERY AND VALIDATION OF BIOMARKERS FOR THE PREDICTION, DIAGNOSIS AND MANAGEMENT OF DISEASE."  Patient was screened and identified as being eligible to participate in the above study. Dr. Julien Nordmann saw patient today and he introduced the studyto patient and he agreed to meet with research nurse. I then met with patient and his sister inexam room todiscussthe study andoffer him the opportunity to participate. Informed patient that participation is completely voluntary. Informed him this is a one time collection of blood anddata to use for medical research. Participants willbe given a $50 debit card upon collection of blood.  Patient stated he was interested in participating, but did not want to have his blood drawn today. He would like to have blood drawn on next lab appointment which will be in 3 months. Patient agreed for research staff to contact him prior to next lab appointment to see if he is still interested at that time.  Provided patient with a consent form for him to review at home.  Gave him a Investment banker, corporate and business card.  Encouraged him to call if he has any questions or wants to participate prior to his next appointment. Patient verbalized understanding. Thanked patient very much for his time today and willingness to consider this study.  Ryan Contreras, BSN, RN Clinical Research Nurse 10/17/2018 9:50 AM

## 2018-10-18 ENCOUNTER — Ambulatory Visit: Payer: Medicare Other | Admitting: Cardiology

## 2018-10-18 ENCOUNTER — Encounter: Payer: Self-pay | Admitting: Cardiology

## 2018-10-18 VITALS — BP 122/62 | HR 106 | Ht 73.0 in | Wt 212.8 lb

## 2018-10-18 DIAGNOSIS — C3411 Malignant neoplasm of upper lobe, right bronchus or lung: Secondary | ICD-10-CM | POA: Diagnosis not present

## 2018-10-18 DIAGNOSIS — J449 Chronic obstructive pulmonary disease, unspecified: Secondary | ICD-10-CM

## 2018-10-18 DIAGNOSIS — E785 Hyperlipidemia, unspecified: Secondary | ICD-10-CM | POA: Diagnosis not present

## 2018-10-18 DIAGNOSIS — I2510 Atherosclerotic heart disease of native coronary artery without angina pectoris: Secondary | ICD-10-CM

## 2018-10-18 DIAGNOSIS — I10 Essential (primary) hypertension: Secondary | ICD-10-CM

## 2018-10-18 DIAGNOSIS — R0609 Other forms of dyspnea: Secondary | ICD-10-CM

## 2018-10-18 NOTE — Progress Notes (Signed)
Cardiology Office Note:    Date:  10/18/2018   ID:  Ryan Contreras, DOB Jan 05, 1941, MRN [id number]  PCP:  Renato Shin, MD  Cardiologist:  Dorris Carnes, MD  Referring MD: Renato Shin, MD   Chief Complaint  Patient presents with  . Follow-up    CAD    History of Present Illness:    Ryan Contreras is a 77 y.o. male with a past medical history significant for recurrent non-small cell lung cancer s/p steriotactic radiotherapy 05/2018 , treated for lung cancer in 03/2016 with partial response, CAD, HTN, HLD, tobacco abuse, COPD.  His last cardiac cath in 2013 showed LAD 60% mid, D1 40%, LC 40%, OM1 40%, RCA stent patent.  He was last seen in the office on 12/22/2017 by Dr. Harrington Challenger and was stable.  He was continuing to smoke.  Feeling pretty good, just feels tired and has DOE. His DOE has been stable for about the last year, but the patient feels like it might be a little worse in the past couple of months. No chest pain/pressure/tightness. No palpitations, lightheadedness, syncope.  He is not able to do much activity due to his breathing.   Past Medical History:  Diagnosis Date  . Arthritis   . CAD, NATIVE VESSEL    coronary stent x1-tx. Plavix use.  Marland Kitchen COPD (chronic obstructive pulmonary disease) ([place])    due to 50+ years Smoking.  Marland Kitchen Dyspnea    wears oxygen PRN  . Gastrointestinal bleeding, upper 04/29/2016  . Gout, unspecified    foot  . HYPERLIPIDEMIA   . HYPERTENSION   . Lung mass   . NSCL ca dx'd 12/2015  . On supplemental oxygen by nasal cannula    PRN  . PEPTIC ULCER DISEASE   . Pneumonia   . TOBACCO ABUSE   . Type 2 diabetes mellitus ([place])    denies on 01-06-16    Past Surgical History:  Procedure Laterality Date  . CARDIAC CATHETERIZATION     '06-stent placed, 6'13 - Dr. Lizbeth Bark follows  . CORONARY STENT PLACEMENT     s/p  . ENDOBRONCHIAL ULTRASOUND Bilateral 01/11/2016   Procedure: ENDOBRONCHIAL ULTRASOUND;  Surgeon: Collene Gobble, MD;  Location: [facility];   Service: Endoscopy;  Laterality: Bilateral;  . VIDEO BRONCHOSCOPY WITH ENDOBRONCHIAL NAVIGATION Right 03/07/2018   Procedure: VIDEO BRONCHOSCOPY WITH ENDOBRONCHIAL NAVIGATION;  Surgeon: Collene Gobble, MD;  Location: [facility];  Service: Thoracic;  Laterality: Right;    Current Medications: Current Meds  Medication Sig  . acetaminophen (TYLENOL) 500 MG tablet Take 250 mg by mouth every 6 (six) hours as needed for moderate pain or headache.   . allopurinol (ZYLOPRIM) 100 MG tablet TAKE 1 TABLET BY MOUTH EVERY DAY  . aspirin 81 MG tablet Take 1 tablet (81 mg total) by mouth at bedtime. Restart on 03/09/18  . atorvastatin (LIPITOR) 80 MG tablet TAKE 1 TABLET BY MOUTH EVERY DAY  . docusate sodium (COLACE) 100 MG capsule Take 100 mg by mouth at bedtime.  . donepezil (ARICEPT) 10 MG tablet Take 1 tablet (10 mg total) by mouth at bedtime.  Marland Kitchen eplerenone (INSPRA) 25 MG tablet TAKE 1 TABLET BY MOUTH EVERY DAY  . KLOR-CON M20 20 MEQ tablet TAKE 1 TABLET BY MOUTH DAILY  . latanoprost (XALATAN) 0.005 % ophthalmic solution Place 1 drop into both eyes at bedtime.  Marland Kitchen losartan-hydrochlorothiazide (HYZAAR) 100-12.5 MG tablet TAKE 1 TABLET BY MOUTH EVERY DAY  . memantine (NAMENDA) 5 MG tablet Take  1 tablet (5 mg total) by mouth 2 (two) times daily.  Marland Kitchen oxybutynin (DITROPAN) 5 MG tablet Take 5 mg by mouth at bedtime.  . pantoprazole (PROTONIX) 40 MG tablet TAKE 40 MG BY MOUTH EVERY DAY  . PROAIR HFA 108 (90 Base) MCG/ACT inhaler Inhale 2 puffs into the lungs every 6 (six) hours as needed for wheezing or shortness of breath.  . Tiotropium Bromide-Olodaterol (STIOLTO RESPIMAT) 2.5-2.5 MCG/ACT AERS Inhale 2 puffs into the lungs daily.     Allergies:   Patient has no known allergies.   Social History   Socioeconomic History  . Marital status: Divorced    Spouse name: Not on file  . Number of children: 2  . Years of education: Not on file  . Highest education level: Not on file  Occupational History  .  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 Financial resource strain: Not on file  . Food insecurity:    Worry: Not on file    Inability: Not on file  . Transportation needs:    Medical: Not on file    Non-medical: Not on file  Tobacco Use  . Smoking status: Former Smoker    Packs/day: 0.50    Years: 50.00    Pack years: 25.00    Types: Cigarettes    Last attempt to quit: 01/12/2018    Years since quitting: 0.7  . Smokeless tobacco: Never Used  . Tobacco comment: stopped smoking cigarettes 2/ 2019  Substance and Sexual Activity  . Alcohol use: Not Currently    Alcohol/week: 0.0 standard drinks  . Drug use: No  . Sexual activity: Not on file  Lifestyle  . Physical activity:    Days per week: Not on file    Minutes per session: Not on file  . Stress: Not on file  Relationships  . Social connections:    Talks on phone: Not on file    Gets together: Not on file    Attends religious service: Not on file    Active member of club or organization: Not on file    Attends meetings of clubs or organizations: Not on file    Relationship status: Not on file  Other Topics Concern  . Not on file  Social History Narrative   Widowed 2003     Family History: The patient's family history includes Arthritis in his mother; Breast cancer in his sister; Cancer in his brother; Heart attack in his father; Hypertension in his father. ROS:   Please see the history of present illness.     All other systems reviewed and are negative.  EKGs/Labs/Other Studies Reviewed:     EKG:  EKG is not ordered today.    Recent Labs: 12/27/2017: TSH 1.59 10/15/2018: ALT 13; BUN 13; Creatinine 1.03; Hemoglobin 11.1; Platelet Count 327; Potassium 4.2; Sodium 141   Recent Lipid Panel    Component Value Date/Time   CHOL 167 12/27/2017 0844   TRIG 97.0 12/27/2017 0844   TRIG 36 11/28/2006 0837   HDL 60.90 12/27/2017 0844   CHOLHDL 3 12/27/2017 0844   VLDL 19.4 12/27/2017 0844   LDLCALC 86  12/27/2017 0844    Physical Exam:    VS:  Ht 6\' 1"  (1.854 m)   BMI 27.89 kg/m     Wt Readings from Last 3 Encounters:  10/17/18 211 lb 6.4 oz (95.9 kg)  07/27/18 205 lb (93 kg)  07/23/18 205 lb (93 kg)     Physical Exam  Constitutional: He  is oriented to person, place, and time. He appears well-developed and well-nourished. No distress.  HENT:  Head: Normocephalic and atraumatic.  Poor dentition  Eyes: Pupils are equal, round, and reactive to light. Conjunctivae are normal.  Neck: Neck supple. No JVD present.  Cardiovascular: Normal rate, regular rhythm, normal heart sounds and intact distal pulses. Exam reveals no gallop and no friction rub.  No murmur heard. Pulmonary/Chest: Effort normal and breath sounds normal. No respiratory distress. He has no wheezes. He has no rales.  Abdominal: Soft. Bowel sounds are normal.  Musculoskeletal: Normal range of motion. He exhibits no edema.  Neurological: He is alert and oriented to person, place, and time.  Skin: Skin is warm and dry.  Psychiatric: He has a normal mood and affect. His behavior is normal. Judgment and thought content normal.  Vitals reviewed.   ASSESSMENT:    1. Atherosclerosis of native coronary artery of native heart without angina pectoris   2. Malignant neoplasm of right upper lobe of lung ([place])   3. COPD  GOLD C   4. Hyperlipidemia, unspecified hyperlipidemia type   5. Essential hypertension    PLAN:    In order of problems listed above:  CAD:  Last heart cath in 2013-  Mod LAD dz LCx 40%  RCA 40%, LVEF normal, Stent patent.  Medical therapy with aspirin and Plavix.  No chest pain/pressure/tightness.  He is having dyspnea on exertion, possibly related to COPD, but he feels like it might be a little worse over the last couple of months and he would feel better having a stress test.  I will order stress test to evaluate DOE.  Right upper lobe lung cancer, recurrent -Initially treated in 2017.  Patient  underwent stereotactic radiation in 05/2018.  Now under surveillance.  COPD: followed by pulmonology  Hyperlipidemia: Treated with the Lipitor 80 mg.  LDL 86 in 12/2017.  Pt has been taking 1/2 tablet to try and stretch out his prescription. He agrees to begin taking the whole pill every day.  Hypertension: On losartan-hydrochlorothiazide 100-12.5 mg.  Blood pressure is well controlled.  Tobacco abuse: Quit in February.  Congratulated.  Medication Adjustments/Labs and Tests Ordered: Current medicines are reviewed at length with the patient today.  Concerns regarding medicines are outlined above. Labs and tests ordered and medication changes are outlined in the patient instructions below:  There are no Patient Instructions on file for this visit.   Signed, Daune Perch, NP  10/18/2018 10:46 AM    [place]

## 2018-10-18 NOTE — Patient Instructions (Signed)
Medication Instructions:  Your physician recommends that you continue on your current medications as directed. Please refer to the Current Medication list given to you today.  If you need a refill on your cardiac medications before your next appointment, please call your pharmacy.   Lab work: None  If you have labs (blood work) drawn today and your tests are completely normal, you will receive your results only by: Marland Kitchen MyChart Message (if you have MyChart) OR . A paper copy in the mail If you have any lab test that is abnormal or we need to change your treatment, we will call you to review the results.  Testing/Procedures: Your physician has requested that you have a lexiscan myoview. For further information please visit [email or web address]. Please follow instruction sheet, as given.    Follow-Up: At [facility], you and your health needs are our priority.  As part of our continuing mission to provide you with exceptional heart care, we have created designated Provider Care Teams.  These Care Teams include your primary Cardiologist (physician) and Advanced Practice Providers (APPs -  Physician Assistants and Nurse Practitioners) who all work together to provide you with the care you need, when you need it. You will need a follow up appointment in:  6 months.  Please call our office 2 months in advance to schedule this appointment.  You may see Dorris Carnes, MD or one of the following Advanced Practice Providers on your designated Care Team: Richardson Dopp, PA-C [place], [place] . Daune Perch, NP  Any Other Special Instructions Will Be Listed Below (If Applicable).

## 2018-10-23 ENCOUNTER — Telehealth (HOSPITAL_COMMUNITY): Payer: Self-pay | Admitting: *Deleted

## 2018-10-23 NOTE — Telephone Encounter (Signed)
Left message on voicemail in reference to upcoming appointment scheduled for 10/29/18. Phone number given for a call back so details instructions can be given. Donnel Venuto, [place]

## 2018-10-29 ENCOUNTER — Encounter (HOSPITAL_COMMUNITY): Payer: Medicare Other

## 2018-10-30 ENCOUNTER — Ambulatory Visit (INDEPENDENT_AMBULATORY_CARE_PROVIDER_SITE_OTHER): Payer: Medicare Other | Admitting: Endocrinology

## 2018-10-30 ENCOUNTER — Encounter: Payer: Self-pay | Admitting: Endocrinology

## 2018-10-30 VITALS — BP 144/60 | HR 112 | Ht 73.0 in | Wt 212.8 lb

## 2018-10-30 DIAGNOSIS — Z23 Encounter for immunization: Secondary | ICD-10-CM | POA: Diagnosis not present

## 2018-10-30 DIAGNOSIS — E1159 Type 2 diabetes mellitus with other circulatory complications: Secondary | ICD-10-CM | POA: Diagnosis not present

## 2018-10-30 LAB — POCT GLYCOSYLATED HEMOGLOBIN (HGB A1C): HEMOGLOBIN A1C: 6 % — AB (ref 4.0–5.6)

## 2018-10-30 NOTE — Patient Instructions (Addendum)
Please continue the same medications. Best wishes with your new primary care provider.

## 2018-10-30 NOTE — Progress Notes (Signed)
Subjective:    Patient ID: Ryan Contreras, male    DOB: March 03, 1941, 77 y.o.   MRN: [id number]  HPI  Pt returns for f/u of diabetes mellitus: DM type: 2 Dx'ed: 4174 Complications: CAD Therapy: none now DKA: never Severe hypoglycemia: never Pancreatitis: never Pancreatic imaging: never Other: he has never taken insulin Interval history: Main symptom is doe.  Past Medical History:  Diagnosis Date  . Arthritis   . CAD, NATIVE VESSEL    coronary stent x1-tx. Plavix use.  Marland Kitchen COPD (chronic obstructive pulmonary disease) ([place])    due to 50+ years Smoking.  Marland Kitchen Dyspnea    wears oxygen PRN  . Gastrointestinal bleeding, upper 04/29/2016  . Gout, unspecified    foot  . HYPERLIPIDEMIA   . HYPERTENSION   . Lung mass   . NSCL ca dx'd 12/2015  . On supplemental oxygen by nasal cannula    PRN  . PEPTIC ULCER DISEASE   . Pneumonia   . TOBACCO ABUSE   . Type 2 diabetes mellitus ([place])    denies on 01-06-16    Past Surgical History:  Procedure Laterality Date  . CARDIAC CATHETERIZATION     '06-stent placed, 6'13 - Dr. Lizbeth Bark follows  . CORONARY STENT PLACEMENT     s/p  . ENDOBRONCHIAL ULTRASOUND Bilateral 01/11/2016   Procedure: ENDOBRONCHIAL ULTRASOUND;  Surgeon: Collene Gobble, MD;  Location: [facility];  Service: Endoscopy;  Laterality: Bilateral;  . VIDEO BRONCHOSCOPY WITH ENDOBRONCHIAL NAVIGATION Right 03/07/2018   Procedure: VIDEO BRONCHOSCOPY WITH ENDOBRONCHIAL NAVIGATION;  Surgeon: Collene Gobble, MD;  Location: [facility];  Service: Thoracic;  Laterality: Right;    Social History   Socioeconomic History  . Marital status: Divorced    Spouse name: Not on file  . Number of children: 2  . Years of education: Not on file  . Highest education level: Not on file  Occupational History  . 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 Financial resource strain: Not on file  . Food insecurity:    Worry: Not on file    Inability: Not on file  . Transportation needs:   Medical: Not on file    Non-medical: Not on file  Tobacco Use  . Smoking status: Former Smoker    Packs/day: 0.50    Years: 50.00    Pack years: 25.00    Types: Cigarettes    Last attempt to quit: 01/12/2018    Years since quitting: 0.8  . Smokeless tobacco: Never Used  . Tobacco comment: stopped smoking cigarettes 2/ 2019  Substance and Sexual Activity  . Alcohol use: Not Currently    Alcohol/week: 0.0 standard drinks  . Drug use: No  . Sexual activity: Not on file  Lifestyle  . Physical activity:    Days per week: Not on file    Minutes per session: Not on file  . Stress: Not on file  Relationships  . Social connections:    Talks on phone: Not on file    Gets together: Not on file    Attends religious service: Not on file    Active member of club or organization: Not on file    Attends meetings of clubs or organizations: Not on file    Relationship status: Not on file  . Intimate partner violence:    Fear of current or ex partner: Not on file    Emotionally abused: Not on file    Physically abused: Not on file  Forced sexual activity: Not on file  Other Topics Concern  . Not on file  Social History Narrative   Widowed 2003    Current Outpatient Medications on File Prior to Visit  Medication Sig Dispense Refill  . acetaminophen (TYLENOL) 500 MG tablet Take 250 mg by mouth every 6 (six) hours as needed for moderate pain or headache.     . allopurinol (ZYLOPRIM) 100 MG tablet TAKE 1 TABLET BY MOUTH EVERY DAY 90 tablet 0  . aspirin 81 MG tablet Take 1 tablet (81 mg total) by mouth at bedtime. Restart on 03/09/18 30 tablet   . atorvastatin (LIPITOR) 80 MG tablet TAKE 1 TABLET BY MOUTH EVERY DAY 30 tablet 10  . clopidogrel (PLAVIX) 75 MG tablet Take 75 mg by mouth daily.    Marland Kitchen docusate sodium (COLACE) 100 MG capsule Take 100 mg by mouth at bedtime.    . donepezil (ARICEPT) 10 MG tablet Take 1 tablet (10 mg total) by mouth at bedtime. 30 tablet 11  . eplerenone (INSPRA) 25  MG tablet TAKE 1 TABLET BY MOUTH EVERY DAY 30 tablet 5  . finasteride (PROSCAR) 5 MG tablet Take 5 mg by mouth daily.  11  . KLOR-CON M20 20 MEQ tablet TAKE 1 TABLET BY MOUTH DAILY 30 tablet 11  . losartan-hydrochlorothiazide (HYZAAR) 100-12.5 MG tablet TAKE 1 TABLET BY MOUTH EVERY DAY 30 tablet 8  . memantine (NAMENDA) 5 MG tablet Take 1 tablet (5 mg total) by mouth 2 (two) times daily. 60 tablet 11  . oxybutynin (DITROPAN) 5 MG tablet Take 5 mg by mouth at bedtime.  11  . pantoprazole (PROTONIX) 40 MG tablet TAKE 40 MG BY MOUTH EVERY DAY 90 tablet 3  . PROAIR HFA 108 (90 Base) MCG/ACT inhaler Inhale 2 puffs into the lungs every 6 (six) hours as needed for wheezing or shortness of breath.  0  . Tiotropium Bromide-Olodaterol (STIOLTO RESPIMAT) 2.5-2.5 MCG/ACT AERS Inhale 2 puffs into the lungs daily. 1 Inhaler 5   No current facility-administered medications on file prior to visit.     No Known Allergies  Family History  Problem Relation Age of Onset  . Breast cancer Sister   . Cancer Brother        Uncertain type  . Arthritis Mother   . Hypertension Father   . Heart attack Father     BP (!) 144/60 (BP Location: Left Arm, Patient Position: Sitting, Cuff Size: Large) Comment: Did not take antihypertensive this morning  Pulse (!) 112   Ht 6\' 1"  (1.854 m)   Wt 212 lb 12.8 oz (96.5 kg)   SpO2 (!) 89%   BMI 28.08 kg/m    Review of Systems No weight change    Objective:   Physical Exam VITAL SIGNS:  See vs page GENERAL: no distress Pulses: foot pulses are intact bilaterally.   MSK: no deformity of the feet or ankles.  CV: 2+ bilat edema of the legs and ankles, and bilat vv's.   Skin:  no ulcer on the feet or ankles.  normal color and temp on the feet and ankles.   Neuro: sensation is intact to touch on the feet and ankles.   Ext: There is bilateral onychomycosis of the toenails.     Lab Results  Component Value Date   HGBA1C 6.0 (A) 10/30/2018   Lab Results  Component  Value Date   CREATININE 1.03 10/15/2018   BUN 13 10/15/2018   NA 141 10/15/2018   K 4.2  10/15/2018   CL 98 10/15/2018   CO2 35 (H) 10/15/2018      Assessment & Plan:  Type 2 DM: no medication is needed now.  HTN, with poss situational component.  Please continue the same medication for now.  Patient Instructions  Please continue the same medications. Best wishes with your new primary care provider.

## 2018-11-12 ENCOUNTER — Telehealth (HOSPITAL_COMMUNITY): Payer: Self-pay | Admitting: *Deleted

## 2018-11-12 NOTE — Telephone Encounter (Signed)
Patient given detailed instructions per Myocardial Perfusion Study Information Sheet for the test on 11/16/18 Patient notified to arrive 15 minutes early and that it is imperative to arrive on time for appointment to keep from having the test rescheduled.  If you need to cancel or reschedule your appointment, please call the office within 24 hours of your appointment. . Patient verbalized understanding. Kirstie Peri

## 2018-11-16 ENCOUNTER — Ambulatory Visit (HOSPITAL_COMMUNITY): Payer: Medicare Other | Attending: Cardiology

## 2018-11-16 DIAGNOSIS — R0609 Other forms of dyspnea: Secondary | ICD-10-CM | POA: Insufficient documentation

## 2018-11-16 LAB — MYOCARDIAL PERFUSION IMAGING
CHL CUP NUCLEAR SDS: 5
CHL CUP RESTING HR STRESS: 100 {beats}/min
CSEPPHR: 112 {beats}/min
LV sys vol: 29 mL
LVDIAVOL: 69 mL (ref 62–150)
SRS: 2
SSS: 7
TID: 0.9

## 2018-11-16 MED ORDER — REGADENOSON 0.4 MG/5ML IV SOLN
0.4000 mg | Freq: Once | INTRAVENOUS | Status: AC
  Administered 2018-11-16: 0.4 mg via INTRAVENOUS

## 2018-11-16 MED ORDER — TECHNETIUM TC 99M TETROFOSMIN IV KIT
31.8000 | PACK | Freq: Once | INTRAVENOUS | Status: AC | PRN
  Administered 2018-11-16: 31.8 via INTRAVENOUS
  Filled 2018-11-16: qty 32

## 2018-11-16 MED ORDER — TECHNETIUM TC 99M TETROFOSMIN IV KIT
11.0000 | PACK | Freq: Once | INTRAVENOUS | Status: AC | PRN
  Administered 2018-11-16: 11 via INTRAVENOUS
  Filled 2018-11-16: qty 11

## 2018-11-19 ENCOUNTER — Telehealth: Payer: Self-pay | Admitting: Cardiology

## 2018-11-19 NOTE — Telephone Encounter (Signed)
Result Notes for MYOCARDIAL PERFUSION IMAGING   Notes recorded by Daune Perch, NP on 11/19/2018 at 1:48 PM EST Normal stress test. His DOE is most likely related to COPD. Follow up as usual with pulmonologist.   Please send copy to Patient, No Pcp Per  Daune Perch, NP   Pt made aware of stress test results and recommendations per Daune Perch NP.  Advised the pt to follow-up with his Pulmonologist as planned, and with his Oncologist.  Pt verbalized understanding and agrees with this plan.

## 2018-11-19 NOTE — Telephone Encounter (Signed)
New Message   Patient is calling to obtain stress test results. Please call to discuss.

## 2018-12-05 ENCOUNTER — Other Ambulatory Visit: Payer: Self-pay | Admitting: Endocrinology

## 2018-12-11 ENCOUNTER — Other Ambulatory Visit: Payer: Self-pay | Admitting: *Deleted

## 2018-12-11 MED ORDER — TIOTROPIUM BROMIDE-OLODATEROL 2.5-2.5 MCG/ACT IN AERS: 2.0000 | INHALATION_SPRAY | Freq: Every day | RESPIRATORY_TRACT | 5 refills | Status: DC

## 2018-12-28 ENCOUNTER — Other Ambulatory Visit: Payer: Self-pay | Admitting: Pulmonary Disease

## 2019-01-04 ENCOUNTER — Other Ambulatory Visit: Payer: Self-pay | Admitting: Pulmonary Disease

## 2019-01-06 ENCOUNTER — Other Ambulatory Visit: Payer: Self-pay | Admitting: Endocrinology

## 2019-01-06 NOTE — Telephone Encounter (Signed)
Please refill x 3 months Further refills would have to be considered by new PCP   

## 2019-01-15 ENCOUNTER — Other Ambulatory Visit: Payer: Self-pay | Admitting: *Deleted

## 2019-01-15 DIAGNOSIS — Z006 Encounter for examination for normal comparison and control in clinical research program: Secondary | ICD-10-CM

## 2019-01-18 ENCOUNTER — Ambulatory Visit (HOSPITAL_COMMUNITY)
Admission: RE | Admit: 2019-01-18 | Discharge: 2019-01-18 | Disposition: A | Discharge status: Home / Self Care | Payer: Medicare Other | Source: Ambulatory Visit | Attending: Internal Medicine | Admitting: Internal Medicine

## 2019-01-18 ENCOUNTER — Encounter (HOSPITAL_COMMUNITY): Payer: Self-pay

## 2019-01-18 ENCOUNTER — Encounter: Payer: Self-pay | Admitting: *Deleted

## 2019-01-18 ENCOUNTER — Inpatient Hospital Stay: Payer: Medicare Other | Attending: Internal Medicine

## 2019-01-18 DIAGNOSIS — C3411 Malignant neoplasm of upper lobe, right bronchus or lung: Secondary | ICD-10-CM | POA: Diagnosis not present

## 2019-01-18 DIAGNOSIS — C3490 Malignant neoplasm of unspecified part of unspecified bronchus or lung: Secondary | ICD-10-CM | POA: Diagnosis present

## 2019-01-18 DIAGNOSIS — Z006 Encounter for examination for normal comparison and control in clinical research program: Secondary | ICD-10-CM

## 2019-01-18 DIAGNOSIS — Z8711 Personal history of peptic ulcer disease: Secondary | ICD-10-CM | POA: Insufficient documentation

## 2019-01-18 DIAGNOSIS — Z87891 Personal history of nicotine dependence: Secondary | ICD-10-CM | POA: Insufficient documentation

## 2019-01-18 DIAGNOSIS — Z7902 Long term (current) use of antithrombotics/antiplatelets: Secondary | ICD-10-CM | POA: Insufficient documentation

## 2019-01-18 DIAGNOSIS — I10 Essential (primary) hypertension: Secondary | ICD-10-CM | POA: Insufficient documentation

## 2019-01-18 DIAGNOSIS — Z7982 Long term (current) use of aspirin: Secondary | ICD-10-CM | POA: Diagnosis not present

## 2019-01-18 DIAGNOSIS — Z79899 Other long term (current) drug therapy: Secondary | ICD-10-CM | POA: Diagnosis not present

## 2019-01-18 DIAGNOSIS — E119 Type 2 diabetes mellitus without complications: Secondary | ICD-10-CM | POA: Insufficient documentation

## 2019-01-18 DIAGNOSIS — C3432 Malignant neoplasm of lower lobe, left bronchus or lung: Secondary | ICD-10-CM

## 2019-01-18 LAB — CBC WITH DIFFERENTIAL (CANCER CENTER ONLY)
Abs Immature Granulocytes: 0.07 10*3/uL (ref 0.00–0.07)
BASOS PCT: 1 %
Basophils Absolute: 0.1 10*3/uL (ref 0.0–0.1)
Eosinophils Absolute: 0.3 10*3/uL (ref 0.0–0.5)
Eosinophils Relative: 4 %
HCT: 33.5 % — ABNORMAL LOW (ref 39.0–52.0)
Hemoglobin: 10.6 g/dL — ABNORMAL LOW (ref 13.0–17.0)
Immature Granulocytes: 1 %
Lymphocytes Relative: 22 %
Lymphs Abs: 1.9 10*3/uL (ref 0.7–4.0)
MCH: 28.6 pg (ref 26.0–34.0)
MCHC: 31.6 g/dL (ref 30.0–36.0)
MCV: 90.5 fL (ref 80.0–100.0)
MONOS PCT: 16 %
Monocytes Absolute: 1.4 10*3/uL — ABNORMAL HIGH (ref 0.1–1.0)
Neutro Abs: 4.9 10*3/uL (ref 1.7–7.7)
Neutrophils Relative %: 56 %
Platelet Count: 328 10*3/uL (ref 150–400)
RBC: 3.7 MIL/uL — AB (ref 4.22–5.81)
RDW: 15.7 % — AB (ref 11.5–15.5)
WBC Count: 8.7 10*3/uL (ref 4.0–10.5)
nRBC: 0 % (ref 0.0–0.2)

## 2019-01-18 LAB — RESEARCH LABS

## 2019-01-18 LAB — CMP (CANCER CENTER ONLY)
ALT: 16 U/L (ref 0–44)
AST: 28 U/L (ref 15–41)
Albumin: 3.4 g/dL — ABNORMAL LOW (ref 3.5–5.0)
Alkaline Phosphatase: 114 U/L (ref 38–126)
Anion gap: 8 (ref 5–15)
BUN: 11 mg/dL (ref 8–23)
CO2: 31 mmol/L (ref 22–32)
Calcium: 9.1 mg/dL (ref 8.9–10.3)
Chloride: 99 mmol/L (ref 98–111)
Creatinine: 0.9 mg/dL (ref 0.61–1.24)
GFR, Estimated: >60 mL/min (ref >60)
Glucose, Bld: 128 mg/dL — ABNORMAL HIGH (ref 70–99)
Potassium: 4 mmol/L (ref 3.5–5.1)
SODIUM: 138 mmol/L (ref 135–145)
Total Bilirubin: 0.6 mg/dL (ref 0.3–1.2)
Total Protein: 8.4 g/dL — ABNORMAL HIGH (ref 6.5–8.1)

## 2019-01-18 MED ORDER — IOHEXOL 300 MG/ML  SOLN
100.0000 mL | Freq: Once | INTRAMUSCULAR | Status: AC | PRN
  Administered 2019-01-18: 100 mL via INTRAVENOUS

## 2019-01-18 MED ORDER — SODIUM CHLORIDE (PF) 0.9 % IJ SOLN
INTRAMUSCULAR | Status: AC
  Filled 2019-01-18: qty 50

## 2019-01-18 NOTE — Research (Signed)
Human Biospecimens for the Discovery and Validation of Biomarkers for the Prediction, Diagnosis and Management of Disease  ADX01 - [id number] PI: Bobbye Riggs, MD Attending:  Visit: Consent  X Study was introduced by the attending physician.  X Patient had the opportunity to ask questions.  X Consenting personnel has reviewed consent in entirety with patient.  Discussion included, but not limited to: protocol expectations/evaluations, side effects, risks and benefits of the procedures associated with the study.  X Patient verbalizes good understanding of all instructions and information in consent, including the voluntary nature of participation, and agrees to comply with all protocol requirements.  X The patient wishes to participate in this clinical trial and willingly signed the consent document.  X Copy of signed consent was given to the patient.  The original was scanned to medial records and the original will be placed in the subject's research chart.  X No study-specific procedures were done prior to consent.  Any study specific tests/procedures will now be scheduled and completed per protocol for screening purposes.  X  The patient is aware that their insurance will not be billed as this is not a pharmaceutical trial and we are usually just collecting labs at an already scheduled lab visit.    X Patient signed the ICF (IRB approved 09/22/2017), HIPPA (IRB approved 06/30/2017) forms on 01/18/2019 at 8:40 am  X The patient was provided with the contact information for the PI and the IRB if they have any research related questions  X The PI was notified that the patient consented to the study   Patient  provided blood 01/18/2019 to complete study enrollment.  A VISA debit card was provided as specified by the protocol. [facility] 01/18/19

## 2019-01-18 NOTE — Progress Notes (Signed)
Patient in clinic today,labs drawn per [id number] protocol. Patient was given a $50 debit card for participating in the research study. Patient was thanked for his time and contribution  to the study.

## 2019-01-21 ENCOUNTER — Inpatient Hospital Stay (HOSPITAL_BASED_OUTPATIENT_CLINIC_OR_DEPARTMENT_OTHER): Payer: Medicare Other | Admitting: Internal Medicine

## 2019-01-21 ENCOUNTER — Encounter: Payer: Self-pay | Admitting: Internal Medicine

## 2019-01-21 ENCOUNTER — Telehealth: Payer: Self-pay | Admitting: Internal Medicine

## 2019-01-21 ENCOUNTER — Other Ambulatory Visit: Payer: Self-pay

## 2019-01-21 VITALS — BP 122/59 | HR 112 | Temp 98.8°F | Resp 18 | Ht 73.0 in | Wt 214.0 lb

## 2019-01-21 DIAGNOSIS — C3432 Malignant neoplasm of lower lobe, left bronchus or lung: Secondary | ICD-10-CM

## 2019-01-21 DIAGNOSIS — Z8711 Personal history of peptic ulcer disease: Secondary | ICD-10-CM

## 2019-01-21 DIAGNOSIS — I10 Essential (primary) hypertension: Secondary | ICD-10-CM | POA: Diagnosis not present

## 2019-01-21 DIAGNOSIS — E119 Type 2 diabetes mellitus without complications: Secondary | ICD-10-CM | POA: Diagnosis not present

## 2019-01-21 DIAGNOSIS — C3411 Malignant neoplasm of upper lobe, right bronchus or lung: Secondary | ICD-10-CM

## 2019-01-21 DIAGNOSIS — C3490 Malignant neoplasm of unspecified part of unspecified bronchus or lung: Secondary | ICD-10-CM

## 2019-01-21 DIAGNOSIS — Z7902 Long term (current) use of antithrombotics/antiplatelets: Secondary | ICD-10-CM

## 2019-01-21 DIAGNOSIS — Z87891 Personal history of nicotine dependence: Secondary | ICD-10-CM | POA: Diagnosis not present

## 2019-01-21 DIAGNOSIS — Z7982 Long term (current) use of aspirin: Secondary | ICD-10-CM

## 2019-01-21 DIAGNOSIS — Z79899 Other long term (current) drug therapy: Secondary | ICD-10-CM

## 2019-01-21 NOTE — Telephone Encounter (Signed)
Scheduled appt per 2/10 los.  Printed calendar and avs.

## 2019-01-21 NOTE — Progress Notes (Signed)
[place] [phone]   [phone]) [phone]  OFFICE PROGRESS NOTE  Patient, No Pcp Per No address on file  DIAGNOSIS: Recurrent non-small cell lung cancer, squamous cell carcinoma initially presented as unresectable Stage IIA (T1b, N1, M0) non-small cell lung cancer, squamous cell carcinoma presented with right upper lobe lung nodule and hilar lymphadenopathy diagnosed in January 2017.  PRIOR THERAPY:  1) Course of concurrent chemoradiation with weekly carboplatin for AUC of 2 and paclitaxel 45 MG/M2. Status post 7 cycles, last dose was given 03/14/2016 with partial response. 2) status post stereotactic radiotherapy to recurrent lung cancer in the right upper lobe under the care of Dr. Lisbeth Renshaw completed on 06/08/2018  CURRENT THERAPY: Observation.  INTERVAL HISTORY: Ryan Contreras 78 y.o. male returns to the clinic today for follow-up visit accompanied by his wife.  The patient is feeling fine today with no concerning complaints except for the shortness of breath and cough productive of clear sputum.  He denied having any chest pain or hemoptysis.  He denied having any fever or chills.  He has no nausea, vomiting, diarrhea or constipation.  He denied having any headache or visual changes.  The patient had repeat CT scan of the chest performed recently and he is here for evaluation and discussion of his scan results.   MEDICAL HISTORY: Past Medical History:  Diagnosis Date  . Arthritis   . CAD, NATIVE VESSEL    coronary stent x1-tx. Plavix use.  Marland Kitchen COPD (chronic obstructive pulmonary disease) ([place])    due to 50+ years Smoking.  Marland Kitchen Dyspnea    wears oxygen PRN  . Gastrointestinal bleeding, upper 04/29/2016  . Gout, unspecified    foot  . HYPERLIPIDEMIA   . HYPERTENSION   . Lung mass   . NSCL ca dx'd 12/2015  . On supplemental oxygen by nasal cannula    PRN  . PEPTIC ULCER DISEASE   . Pneumonia   . TOBACCO ABUSE   . Type 2 diabetes mellitus ([place])    denies on 01-06-16    ALLERGIES:  has No Known Allergies.  MEDICATIONS:  Current Outpatient Medications  Medication Sig Dispense Refill  . acetaminophen (TYLENOL) 500 MG tablet Take 250 mg by mouth every 6 (six) hours as needed for moderate pain or headache.     . allopurinol (ZYLOPRIM) 100 MG tablet TAKE 1 TABLET BY MOUTH EVERY DAY 90 tablet 0  . aspirin 81 MG tablet Take 1 tablet (81 mg total) by mouth at bedtime. Restart on 03/09/18 30 tablet   . atorvastatin (LIPITOR) 80 MG tablet TAKE 1 TABLET BY MOUTH EVERY DAY 30 tablet 10  . clopidogrel (PLAVIX) 75 MG tablet Take 75 mg by mouth daily.    Marland Kitchen docusate sodium (COLACE) 100 MG capsule Take 100 mg by mouth at bedtime.    . donepezil (ARICEPT) 10 MG tablet Take 1 tablet (10 mg total) by mouth at bedtime. 30 tablet 11  . eplerenone (INSPRA) 25 MG tablet TAKE 1 TABLET BY MOUTH EVERY DAY 30 tablet 5  . finasteride (PROSCAR) 5 MG tablet Take 5 mg by mouth daily.  11  . KLOR-CON M20 20 MEQ tablet TAKE 1 TABLET BY MOUTH DAILY 30 tablet 11  . losartan-hydrochlorothiazide (HYZAAR) 100-12.5 MG tablet TAKE 1 TABLET BY MOUTH EVERY DAY 30 tablet 8  . memantine (NAMENDA) 5 MG tablet Take 1 tablet (5 mg total) by mouth 2 (two) times daily. 60 tablet 11  . oxybutynin (DITROPAN) 5  MG tablet Take 5 mg by mouth at bedtime.  11  . pantoprazole (PROTONIX) 40 MG tablet TAKE 40 MG BY MOUTH EVERY DAY 90 tablet 3  . PROAIR HFA 108 (90 Base) MCG/ACT inhaler Inhale 2 puffs into the lungs every 6 (six) hours as needed for wheezing or shortness of breath.  0  . Tiotropium Bromide-Olodaterol (STIOLTO RESPIMAT) 2.5-2.5 MCG/ACT AERS Inhale 2 puffs into the lungs daily. 1 Inhaler 5   No current facility-administered medications for this visit.     SURGICAL HISTORY:  Past Surgical History:  Procedure Laterality Date  . CARDIAC CATHETERIZATION     '06-stent placed, 6'13 - Dr. Lizbeth Bark follows  . CORONARY STENT PLACEMENT     s/p  . ENDOBRONCHIAL ULTRASOUND Bilateral  01/11/2016   Procedure: ENDOBRONCHIAL ULTRASOUND;  Surgeon: Collene Gobble, MD;  Location: [facility];  Service: Endoscopy;  Laterality: Bilateral;  . VIDEO BRONCHOSCOPY WITH ENDOBRONCHIAL NAVIGATION Right 03/07/2018   Procedure: VIDEO BRONCHOSCOPY WITH ENDOBRONCHIAL NAVIGATION;  Surgeon: Collene Gobble, MD;  Location: [facility];  Service: Thoracic;  Laterality: Right;    REVIEW OF SYSTEMS:  A comprehensive review of systems was negative except for: Constitutional: positive for fatigue Respiratory: positive for cough, dyspnea on exertion and sputum   PHYSICAL EXAMINATION: General appearance: alert, cooperative and no distress Head: Normocephalic, without obvious abnormality, atraumatic Neck: no adenopathy, no JVD, supple, symmetrical, trachea midline and thyroid not enlarged, symmetric, no tenderness/mass/nodules Lymph nodes: Cervical, supraclavicular, and axillary nodes normal. Resp: clear to auscultation bilaterally Back: symmetric, no curvature. ROM normal. No CVA tenderness. Cardio: regular rate and rhythm, S1, S2 normal, no murmur, click, rub or gallop GI: soft, non-tender; bowel sounds normal; no masses,  no organomegaly Extremities: extremities normal, atraumatic, no cyanosis or edema  ECOG PERFORMANCE STATUS: 1 - Symptomatic but completely ambulatory  Blood pressure (!) 122/59, pulse (!) 112, temperature 98.8 F (37.1 C), temperature source Oral, resp. rate 18, height 6\' 1"  (1.854 m), weight 214 lb (97.1 kg), SpO2 96 %.  LABORATORY DATA: Lab Results  Component Value Date   WBC 8.7 01/18/2019   HGB 10.6 (L) 01/18/2019   HCT 33.5 (L) 01/18/2019   MCV 90.5 01/18/2019   PLT 328 01/18/2019      Chemistry      Component Value Date/Time   NA 138 01/18/2019 0906   NA 141 06/16/2017 0906   K 4.0 01/18/2019 0906   K 3.4 (L) 06/16/2017 0906   CL 99 01/18/2019 0906   CO2 31 01/18/2019 0906   CO2 35 (H) 06/16/2017 0906   BUN 11 01/18/2019 0906   BUN 10.3 06/16/2017 0906    CREATININE 0.90 01/18/2019 0906   CREATININE 0.8 06/16/2017 0906      Component Value Date/Time   CALCIUM 9.1 01/18/2019 0906   CALCIUM 9.3 06/16/2017 0906   ALKPHOS 114 01/18/2019 0906   ALKPHOS 87 06/16/2017 0906   AST 28 01/18/2019 0906   AST 21 06/16/2017 0906   ALT 16 01/18/2019 0906   ALT 15 06/16/2017 0906   BILITOT 0.6 01/18/2019 0906   BILITOT 0.46 06/16/2017 0906       RADIOGRAPHIC STUDIES: Ct Chest W Contrast  Result Date: 01/18/2019 CLINICAL DATA:  Metastatic squamous cell carcinoma of the right upper lobe. EXAM: CT CHEST WITH CONTRAST TECHNIQUE: Multidetector CT imaging of the chest was performed during intravenous contrast administration. CONTRAST:  163mL OMNIPAQUE IOHEXOL 300 MG/ML  SOLN COMPARISON:  10/15/2018. FINDINGS: Cardiovascular: The heart size is normal. No substantial pericardial  effusion. Coronary artery calcification is evident. Atherosclerotic calcification is noted in the wall of the thoracic aorta. Mediastinum/Nodes: No mediastinal lymphadenopathy. No left hilar lymphadenopathy. Post treatment changes noted in the right hilum. The esophagus has normal imaging features. There is no axillary lymphadenopathy. Lungs/Pleura: Centrilobular and paraseptal emphysema again noted. Radiation scarring in the right is similar to prior with masslike consolidation anteriorly in the right upper lobe. Previously identified sub solid lesion in the medial left lower lobe has progressed slightly in the interval measuring 2.4 x 1.3 cm today compared to 1.7 x 1.1 cm previously. No new suspicious nodule or mass identified on today's study. No pleural effusion. Upper Abdomen: Unremarkable Musculoskeletal: No worrisome lytic or sclerotic osseous abnormality. Stable appearance probable sebaceous cyst midline anterior chest wall. IMPRESSION: 1. No substantial interval change in exam. 2. Masslike architectural distortion and consolidation in the anterior right upper lobe is similar and likely  reflects radiation fibrosis. 3. Slight increase in size of the sub solid lesion medial left lower lobe. Continued close attention recommended. 4.  Emphysema. (ICD10-J43.9) 5.  Aortic Atherosclerois (ICD10-170.0) Electronically Signed   By: Misty Stanley M.D.   On: 01/18/2019 13:31    ASSESSMENT AND PLAN:  This is a very pleasant 78 years old African-American male with recurrent non-small cell lung cancer, squamous cell carcinoma presented initially as unresectable a stage IIA non-small cell lung cancer, squamous cell carcinoma status post a course of concurrent chemoradiation.  The patient has been in observation since April 2017. Repeat CT scan of the chest showed enlargement of right upper lobe lung nodule suspicious for disease recurrence. A PET scan performed recently showed findings consistent with lung cancer recurrence with in the central right upper lobe as well as new ground glass nodule in the left lower lobe with low but measurable metabolic activity concerning for low-grade adenocarcinoma. The patient underwent repeat bronchoscopy and biopsy of the right upper lobe was consistent with squamous cell carcinoma.  The left lower lobe biopsy showed malignant cells. The patient underwent a stereotactic radiotherapy to the recurrent lung cancer under the care of Dr. Lisbeth Renshaw.  The patient is currently on observation and he is feeling fine. Repeat CT scan of the chest performed recently showed no significant changes or evidence for disease progression. I recommended for the patient to continue on observation with repeat CT scan of the chest in 6 months. He was advised to call immediately if he has any concerning symptoms in the interval. The patient voices understanding of current disease status and treatment options and is in agreement with the current care plan. All questions were answered. The patient knows to call the clinic with any problems, questions or concerns. We can certainly see the  patient much sooner if necessary.  Disclaimer: This note was dictated with voice recognition software. Similar sounding words can inadvertently be transcribed and may be missed upon review.

## 2019-02-10 IMAGING — CT CT CHEST W/ CM
2 of 3 series · 13 of 36 positions shown, 16 images · IV contrast (ISOVUE 300)
Comparison: Chest CT 06/19/2017.

CLINICAL DATA: 76-year-old male with history of non-small cell lung
cancer diagnosed in December 2015 status post chemotherapy (completed
in March 2017). Followup study.

EXAM:
CT CHEST WITH CONTRAST
TECHNIQUE: Multidetector CT imaging of the chest was performed during
intravenous contrast administration.
CONTRAST:  75mL [id number] IOPAMIDOL ([id number]) INJECTION 61%

[Series 2: axial st · axial · 0.77mm/px · z∈[+1344,+1624]mm · 10 of 166 slices shown, 13 images]
[im 13/166  mediastinal]
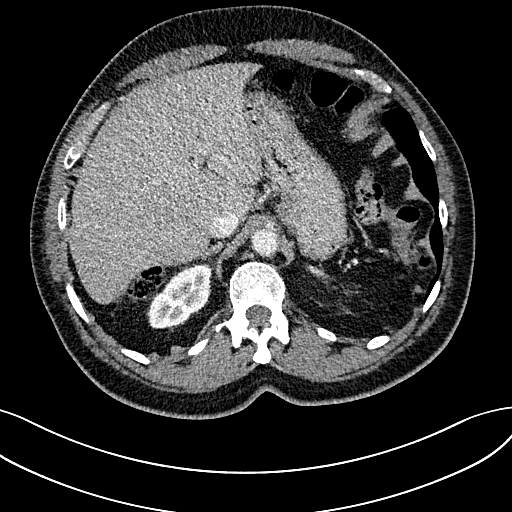
[im 13/166  lung]
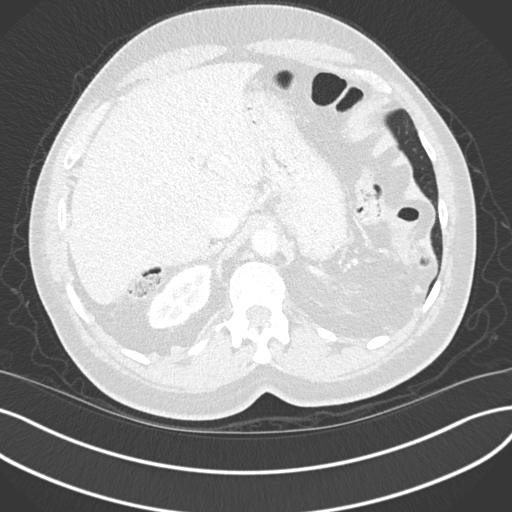
[im 25/166  lung]
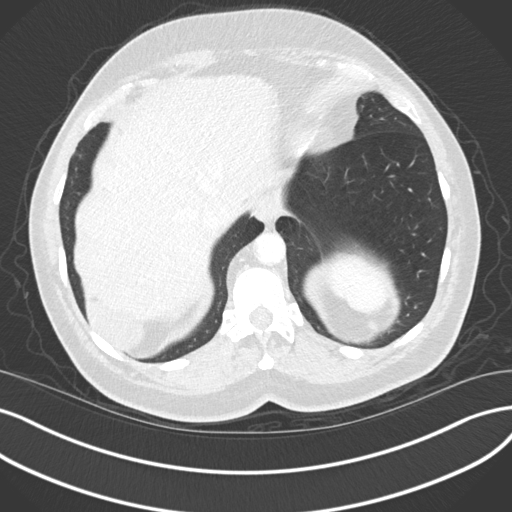
[im 43/166  lung]
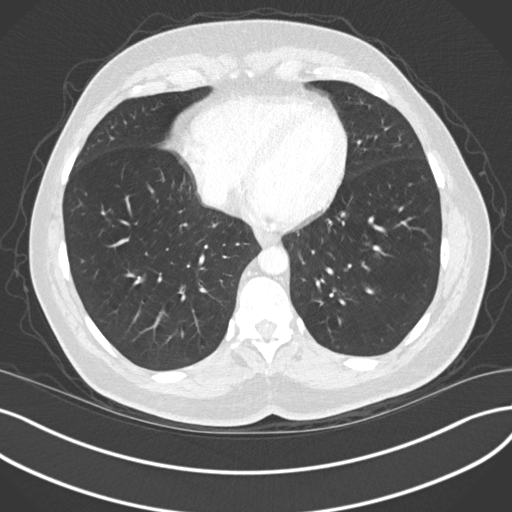
[im 62/166  lung]
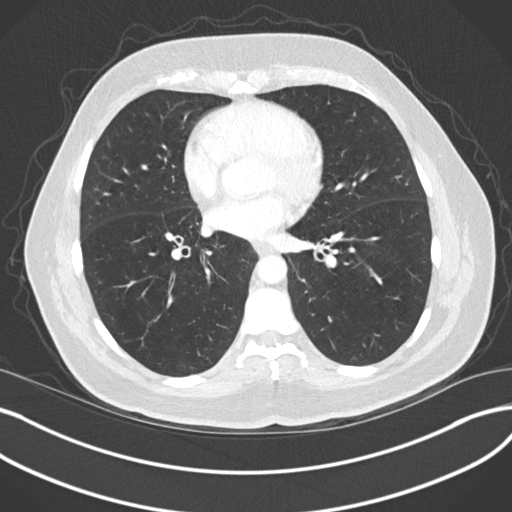
[im 74/166  mediastinal]
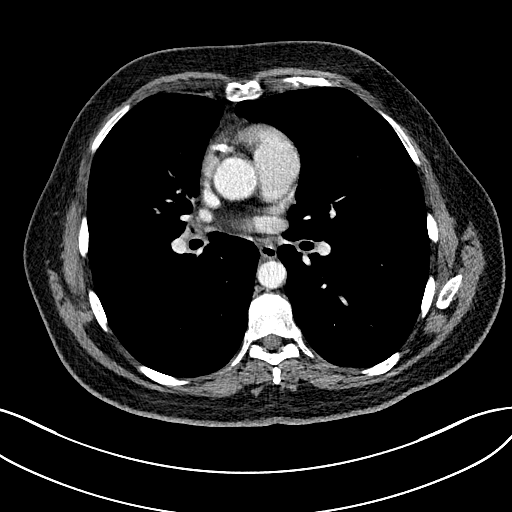
[im 74/166  lung]
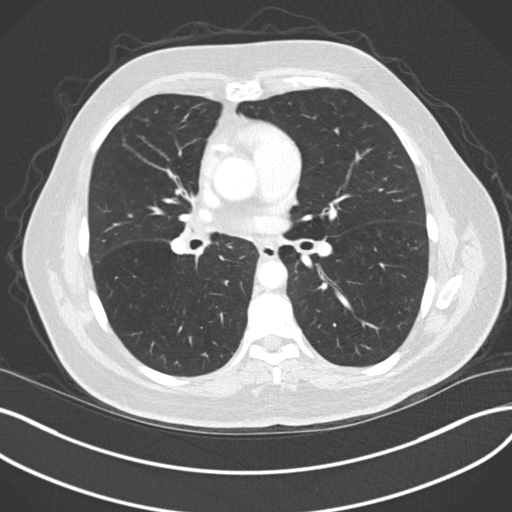
[im 92/166  lung]
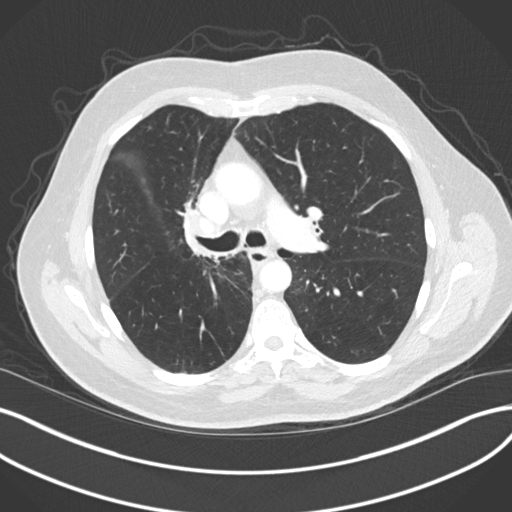
[im 104/166  lung]
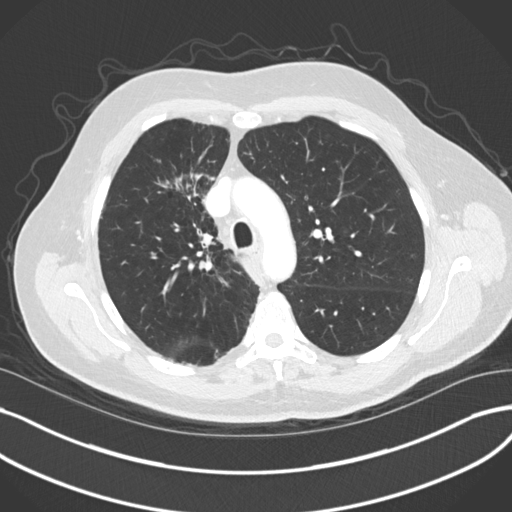
[im 123/166  lung]
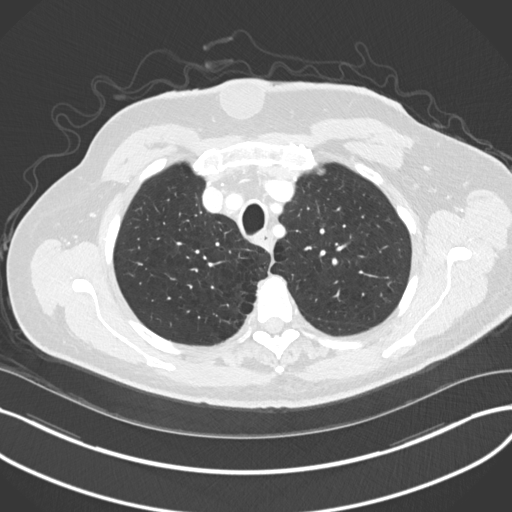
[im 141/166  mediastinal]
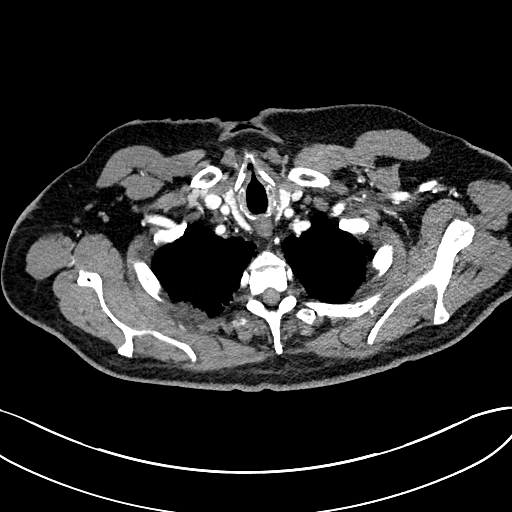
[im 141/166  lung]
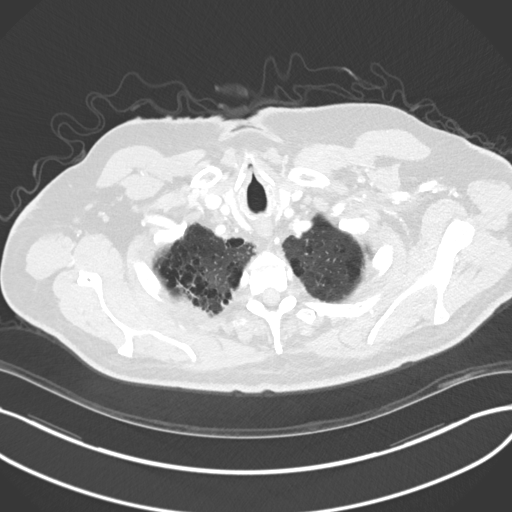
[im 153/166  lung]
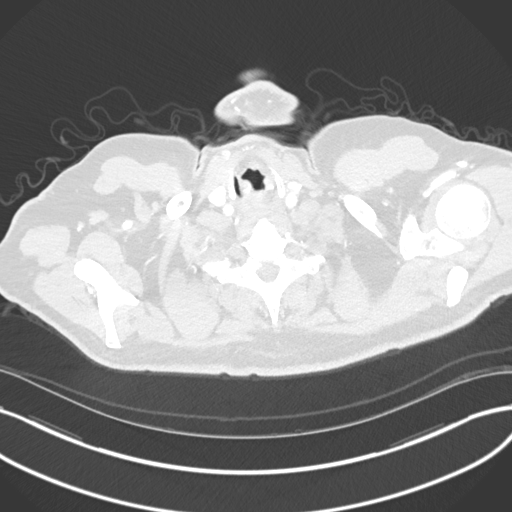

[Series 5: coronal · coronal · 0.67mm/px · 3 of 142 slices shown]
[im 29/142  lung]
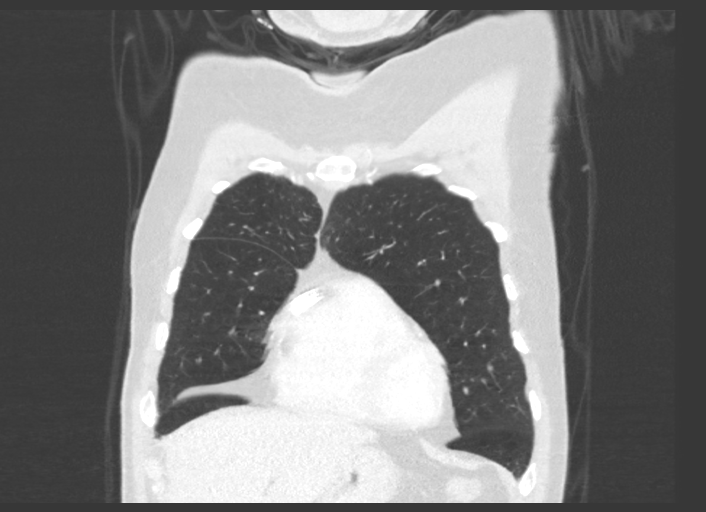
[im 57/142  lung]
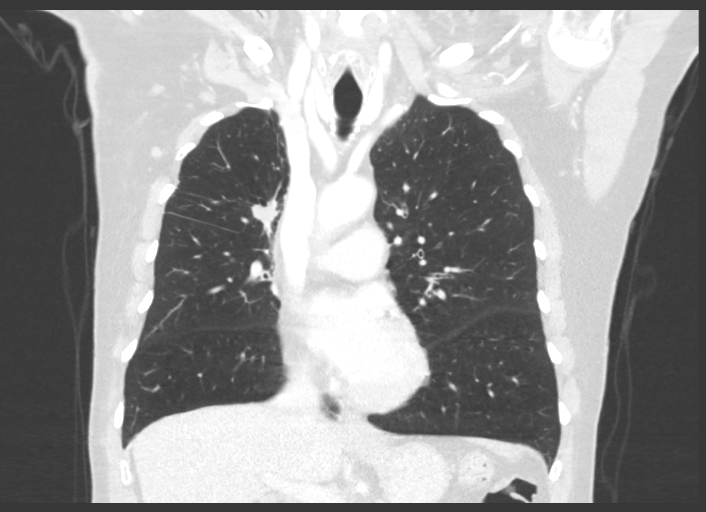
[im 85/142  lung]
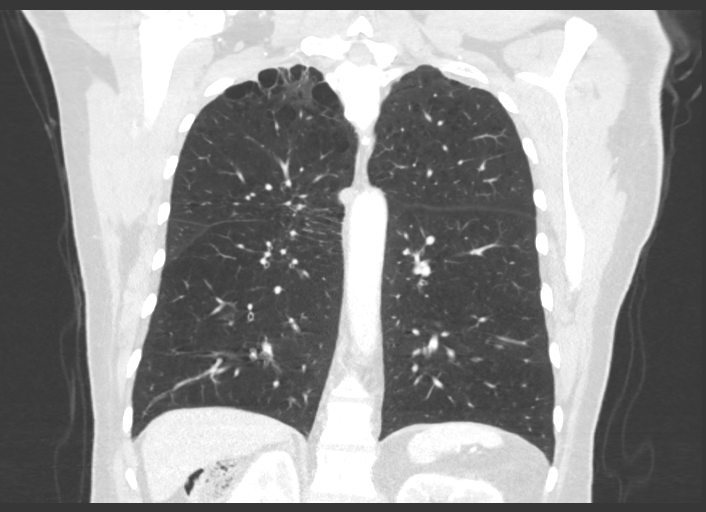

[13 of 36 positions shown; findings below may reference images not displayed]

FINDINGS: Cardiovascular: Heart size is normal. There is no significant
pericardial fluid, thickening or pericardial calcification. There is
aortic atherosclerosis, as well as atherosclerosis of the great
vessels of the mediastinum and the coronary arteries, including
calcified atherosclerotic plaque in the left main, left anterior
descending, left circumflex and right coronary arteries.

Mediastinum/Nodes: No pathologically enlarged mediastinal or hilar
lymph nodes. Esophagus is unremarkable in appearance. No axillary
lymphadenopathy.

Lungs/Pleura: Compared to the prior examination the previously noted
nodule in the central aspect of the right upper lobe has
significantly increased in size, currently measuring 2.9 x 1.7 x
cm (axial image 69 of series 7 and coronal image 53 of series 5),
demonstrating macrolobulated and spiculated margins. Extending it
outward from this lesion into the more distal lung parenchyma there
are band-like areas of septal thickening, likely to reflect some
developing mild postobstructive changes. In the medial aspect of the
superior segment of the left lower lobe (axial image 80 of series 7)
there is a mixed solid and subsolid lesion which is predominantly
ground-glass attenuation and septal thickening measuring 17 x 23 mm,
but has an internal solid component measuring up to 9 mm (axial
image 82 of series 2), and has clearly enlarged compared to prior
examinations, highly concerning for slow-growing primary
bronchogenic adenocarcinoma. No acute consolidative airspace
disease. No pleural effusions. Diffuse bronchial wall thickening
with mild to moderate centrilobular and paraseptal emphysema.

Upper Abdomen: Intermediate attenuation lesion associated with the
liver capsule adjacent to segment 7 of the liver is similar to prior
studies, likely a mildly proteinaceous cyst, measuring 3.0 x 5.0 cm.

Musculoskeletal: There are no aggressive appearing lytic or blastic
lesions noted in the visualized portions of the skeleton.
IMPRESSION: 1. Findings are highly concerning for local recurrence of disease in
the central aspect of the right upper lobe, within an enlarging
x 1.7 cm nodule. No mediastinal or hilar lymphadenopathy noted at
this time.
2. In addition, there is an enlarging mixed solid and subsolid
lesion in the medial aspect of the superior segment of the left
lower lobe which is highly concerning for a slowly growing primary
bronchogenic adenocarcinoma.
3. Aortic atherosclerosis, in addition to left main and 3 vessel
coronary artery disease. Assessment for potential risk factor
modification, dietary therapy or pharmacologic therapy may be
warranted, if clinically indicated.
4. Mild diffuse bronchial wall thickening with mild to moderate
centrilobular and paraseptal emphysema.
5. Additional incidental findings, similar prior studies, as above.
These results will be called to the ordering clinician or
representative by the Radiologist Assistant, and communication
documented in the PACS or zVision Dashboard.

Aortic Atherosclerosis (C5MF2-VGO.O) and Emphysema (C5MF2-A4N.T).

## 2019-04-30 IMAGING — DX DG CHEST 1V PORT
1 series · 1 of 1 positions shown · non-contrast
Comparison: Chest x-ray dated 02/14/2018. Chest CT dated
02/12/2018.

CLINICAL DATA: S/p bronchoscopy with biopsy.

EXAM:
PORTABLE CHEST 1 VIEW

[chest ap]
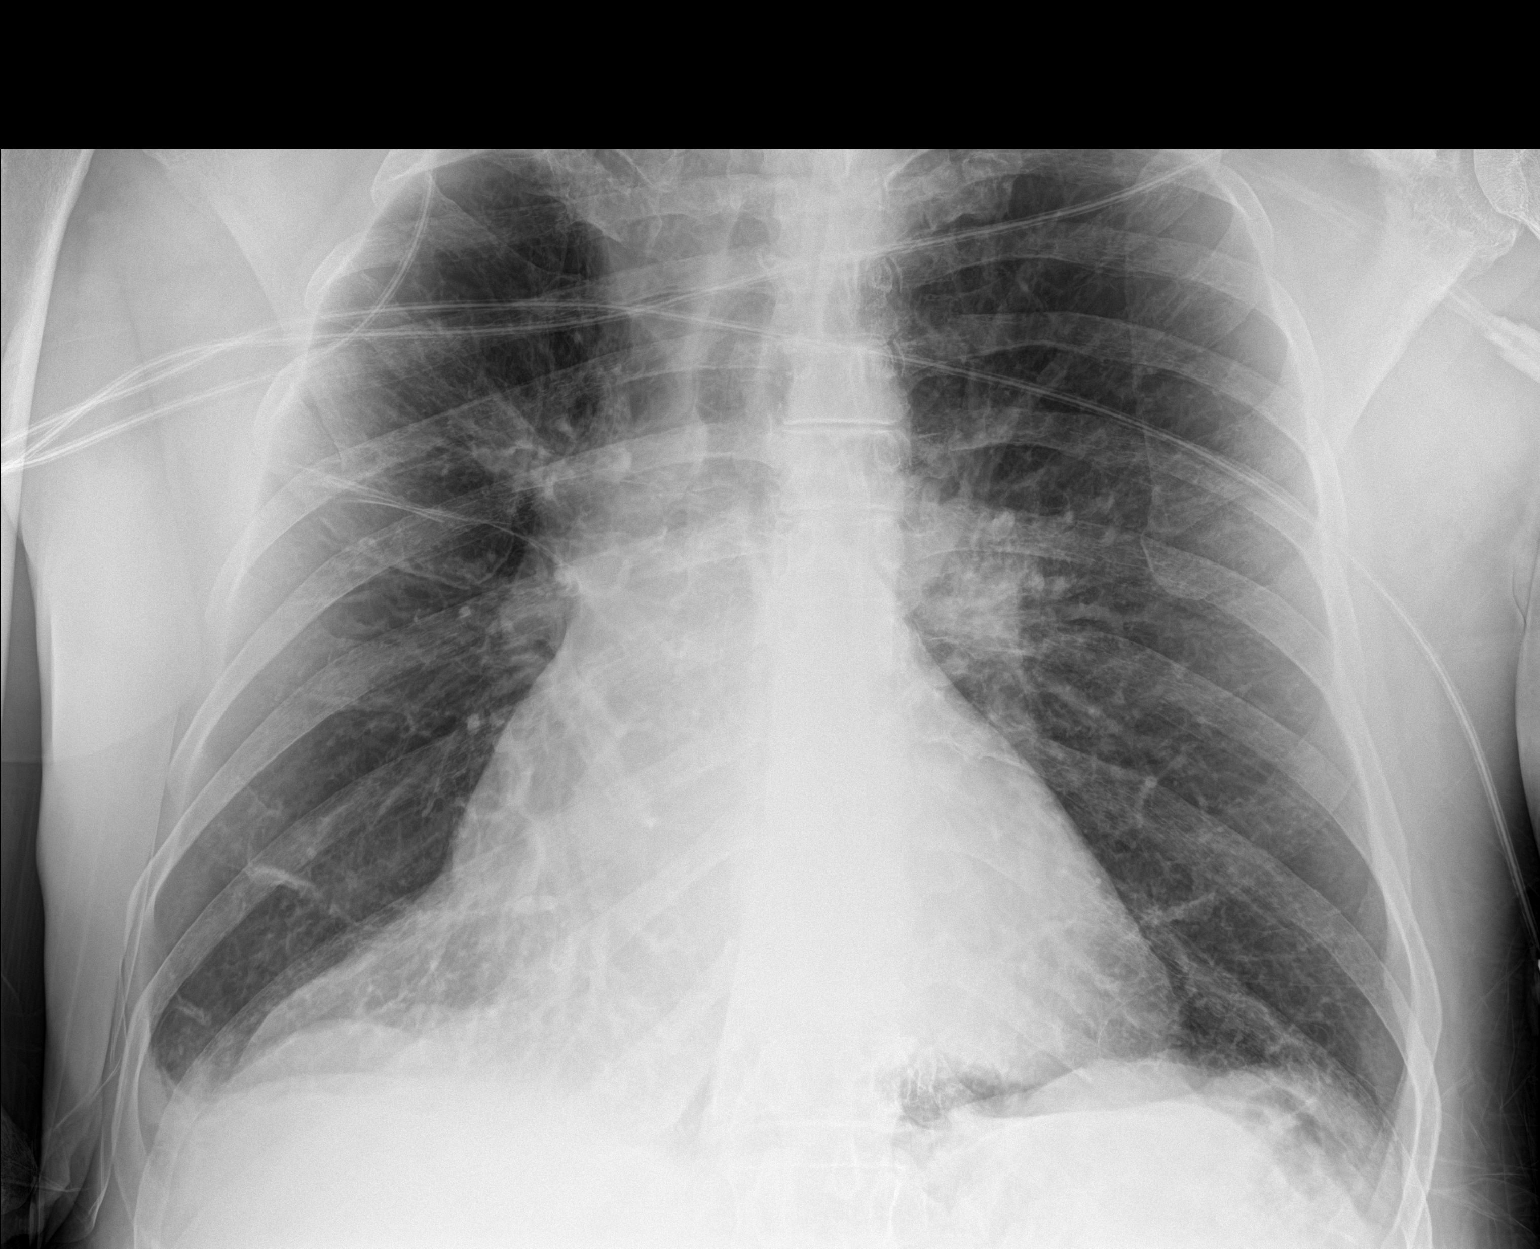

[1 of 1 positions shown; findings below may reference images not displayed]

FINDINGS: Right perihilar opacity is stable, compatible with the perihilar
mass demonstrated on earlier chest CT. Left perihilar region is also
stable in the short-term interval, likely accentuated by the sub
solid lesion demonstrated in the medial left lower lobe on earlier
chest CT.

No new lung findings. No pleural effusion or pneumothorax seen.
Heart size and mediastinal contours are stable.
IMPRESSION: Stable chest x-ray, as detailed above. No pleural effusion,
pneumothorax or other postprocedural complicating features.

## 2019-05-31 ENCOUNTER — Other Ambulatory Visit: Payer: Self-pay | Admitting: Internal Medicine

## 2019-06-21 ENCOUNTER — Other Ambulatory Visit: Payer: Self-pay

## 2019-06-25 ENCOUNTER — Other Ambulatory Visit: Payer: Self-pay

## 2019-06-25 ENCOUNTER — Ambulatory Visit: Payer: Medicare Other | Admitting: Endocrinology

## 2019-06-25 ENCOUNTER — Encounter: Payer: Self-pay | Admitting: Endocrinology

## 2019-06-25 VITALS — BP 148/72 | HR 110 | Ht 73.0 in | Wt 219.4 lb

## 2019-06-25 DIAGNOSIS — R000 Tachycardia, unspecified: Secondary | ICD-10-CM

## 2019-06-25 DIAGNOSIS — E1159 Type 2 diabetes mellitus with other circulatory complications: Secondary | ICD-10-CM

## 2019-06-25 DIAGNOSIS — I10 Essential (primary) hypertension: Secondary | ICD-10-CM

## 2019-06-25 LAB — POCT GLYCOSYLATED HEMOGLOBIN (HGB A1C): Hemoglobin A1C: 6.4 % — AB (ref 4.0–5.6)

## 2019-06-25 NOTE — Patient Instructions (Addendum)
No medication is needed for the diabetes now. Your blood pressure is high today.  Please see your new primary care provider soon, to have it rechecked. I would be happy to see you back here as needed.

## 2019-06-25 NOTE — Progress Notes (Signed)
Subjective:    Patient ID: Ryan Contreras, male    DOB: 08-Mar-1941, 78 y.o.   MRN: [id number]  HPI Pt returns for f/u of diabetes mellitus: DM type: 2 Dx'ed: 1740 Complications: CAD Therapy: none now DKA: never Severe hypoglycemia: never Pancreatitis: never Pancreatic imaging: never Other: he has never taken insulin Interval history: Main symptom is doe--chronic  Past Medical History:  Diagnosis Date  . Arthritis   . CAD, NATIVE VESSEL    coronary stent x1-tx. Plavix use.  Marland Kitchen COPD (chronic obstructive pulmonary disease) ([place])    due to 50+ years Smoking.  Marland Kitchen Dyspnea    wears oxygen PRN  . Gastrointestinal bleeding, upper 04/29/2016  . Gout, unspecified    foot  . HYPERLIPIDEMIA   . HYPERTENSION   . Lung mass   . NSCL ca dx'd 12/2015  . On supplemental oxygen by nasal cannula    PRN  . PEPTIC ULCER DISEASE   . Pneumonia   . TOBACCO ABUSE   . Type 2 diabetes mellitus ([place])    denies on 01-06-16    Past Surgical History:  Procedure Laterality Date  . CARDIAC CATHETERIZATION     '06-stent placed, 6'13 - Dr. Lizbeth Bark follows  . CORONARY STENT PLACEMENT     s/p  . ENDOBRONCHIAL ULTRASOUND Bilateral 01/11/2016   Procedure: ENDOBRONCHIAL ULTRASOUND;  Surgeon: Collene Gobble, MD;  Location: [facility];  Service: Endoscopy;  Laterality: Bilateral;  . VIDEO BRONCHOSCOPY WITH ENDOBRONCHIAL NAVIGATION Right 03/07/2018   Procedure: VIDEO BRONCHOSCOPY WITH ENDOBRONCHIAL NAVIGATION;  Surgeon: Collene Gobble, MD;  Location: [facility];  Service: Thoracic;  Laterality: Right;    Social History   Socioeconomic History  . Marital status: Divorced    Spouse name: Not on file  . Number of children: 2  . Years of education: Not on file  . Highest education level: Not on file  Occupational History  . 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 Financial resource strain: Not on file  . Food insecurity    Worry: Not on file    Inability: Not on file  . Transportation needs   Medical: Not on file    Non-medical: Not on file  Tobacco Use  . Smoking status: Former Smoker    Packs/day: 0.50    Years: 50.00    Pack years: 25.00    Types: Cigarettes    Quit date: 01/12/2018    Years since quitting: 1.4  . Smokeless tobacco: Never Used  . Tobacco comment: stopped smoking cigarettes 2/ 2019  Substance and Sexual Activity  . Alcohol use: Not Currently    Alcohol/week: 0.0 standard drinks  . Drug use: No  . Sexual activity: Not on file  Lifestyle  . Physical activity    Days per week: Not on file    Minutes per session: Not on file  . Stress: Not on file  Relationships  . Social Herbalist on phone: Not on file    Gets together: Not on file    Attends religious service: Not on file    Active member of club or organization: Not on file    Attends meetings of clubs or organizations: Not on file    Relationship status: Not on file  . Intimate partner violence    Fear of current or ex partner: Not on file    Emotionally abused: Not on file    Physically abused: Not on file    Forced sexual activity:  Not on file  Other Topics Concern  . Not on file  Social History Narrative   Widowed 2003    Current Outpatient Medications on File Prior to Visit  Medication Sig Dispense Refill  . acetaminophen (TYLENOL) 500 MG tablet Take 250 mg by mouth every 6 (six) hours as needed for moderate pain or headache.     . alfuzosin (UROXATRAL) 10 MG 24 hr tablet Take 10 mg by mouth daily.    Marland Kitchen allopurinol (ZYLOPRIM) 100 MG tablet TAKE 1 TABLET BY MOUTH EVERY DAY 90 tablet 0  . aspirin 81 MG tablet Take 1 tablet (81 mg total) by mouth at bedtime. Restart on 03/09/18 30 tablet   . atorvastatin (LIPITOR) 80 MG tablet TAKE 1 TABLET BY MOUTH EVERY DAY 30 tablet 10  . clopidogrel (PLAVIX) 75 MG tablet TAKE 1 TABLET BY MOUTH EVERY DAY 90 tablet 0  . docusate sodium (COLACE) 100 MG capsule Take 100 mg by mouth at bedtime.    . donepezil (ARICEPT) 10 MG tablet Take 1 tablet  (10 mg total) by mouth at bedtime. 30 tablet 11  . eplerenone (INSPRA) 25 MG tablet TAKE 1 TABLET BY MOUTH EVERY DAY 30 tablet 5  . finasteride (PROSCAR) 5 MG tablet Take 5 mg by mouth daily.  11  . KLOR-CON M20 20 MEQ tablet TAKE 1 TABLET BY MOUTH DAILY 30 tablet 11  . losartan-hydrochlorothiazide (HYZAAR) 100-12.5 MG tablet TAKE 1 TABLET BY MOUTH EVERY DAY 30 tablet 8  . memantine (NAMENDA) 5 MG tablet Take 1 tablet (5 mg total) by mouth 2 (two) times daily. 60 tablet 11  . oxybutynin (DITROPAN) 5 MG tablet Take 5 mg by mouth at bedtime.  11  . pantoprazole (PROTONIX) 40 MG tablet TAKE 40 MG BY MOUTH EVERY DAY 90 tablet 3  . PROAIR HFA 108 (90 Base) MCG/ACT inhaler Inhale 2 puffs into the lungs every 6 (six) hours as needed for wheezing or shortness of breath.  0  . Tiotropium Bromide-Olodaterol (STIOLTO RESPIMAT) 2.5-2.5 MCG/ACT AERS Inhale 2 puffs into the lungs daily. 1 Inhaler 5   No current facility-administered medications on file prior to visit.     No Known Allergies  Family History  Problem Relation Age of Onset  . Breast cancer Sister   . Cancer Brother        Uncertain type  . Arthritis Mother   . Hypertension Father   . Heart attack Father     BP (!) 148/72 (BP Location: Left Arm, Patient Position: Sitting, Cuff Size: Large)   Pulse (!) 110   Ht 6\' 1"  (1.854 m)   Wt 219 lb 6.4 oz (99.5 kg)   SpO2 91%   BMI 28.95 kg/m    Review of Systems No weight change.     Objective:   Physical Exam VITAL SIGNS:  See vs page GENERAL: no distress Pulses: dorsalis pedis intact bilat.   MSK: no deformity of the feet CV: 1+ bilat leg edema, and bilat vv's Skin:  no ulcer on the feet.  normal color and temp on the feet. Neuro: sensation is intact to touch on the feet.  Ext: there is bilateral onychomycosis of the toenails, which are very long.   A1c=6.4%  Lab Results  Component Value Date   CREATININE 0.90 01/18/2019   BUN 11 01/18/2019   NA 138 01/18/2019   K 4.0  01/18/2019   CL 99 01/18/2019   CO2 31 01/18/2019   Lab Results  Component Value Date  TSH 1.59 12/27/2017      Assessment & Plan:  HTN: is noted today Tachycardia: usually due to COPD Type 2 DM: stable off medication  Patient Instructions  No medication is needed for the diabetes now. Your blood pressure is high today.  Please see your new primary care provider soon, to have it rechecked. I would be happy to see you back here as needed.

## 2019-06-26 ENCOUNTER — Other Ambulatory Visit: Payer: Self-pay | Admitting: Endocrinology

## 2019-06-26 NOTE — Telephone Encounter (Signed)
Please forward refill request to pt's new primary care provider.  

## 2019-06-26 NOTE — Telephone Encounter (Signed)
Pt seen yesterday. Please advise if refill is appropriate

## 2019-07-05 ENCOUNTER — Other Ambulatory Visit: Payer: Self-pay | Admitting: Endocrinology

## 2019-07-05 NOTE — Telephone Encounter (Signed)
Please forward refill request to pt's new primary care provider.  

## 2019-07-05 NOTE — Telephone Encounter (Signed)
Please advise if refill is appropriate 

## 2019-07-19 ENCOUNTER — Inpatient Hospital Stay: Payer: Medicare Other | Attending: Internal Medicine

## 2019-07-19 ENCOUNTER — Ambulatory Visit (HOSPITAL_COMMUNITY)
Admission: RE | Admit: 2019-07-19 | Discharge: 2019-07-19 | Disposition: A | Discharge status: Home / Self Care | Payer: Medicare Other | Source: Ambulatory Visit | Attending: Internal Medicine | Admitting: Internal Medicine

## 2019-07-19 ENCOUNTER — Encounter (HOSPITAL_COMMUNITY): Payer: Self-pay

## 2019-07-19 ENCOUNTER — Other Ambulatory Visit: Payer: Self-pay

## 2019-07-19 DIAGNOSIS — I2510 Atherosclerotic heart disease of native coronary artery without angina pectoris: Secondary | ICD-10-CM | POA: Diagnosis not present

## 2019-07-19 DIAGNOSIS — E785 Hyperlipidemia, unspecified: Secondary | ICD-10-CM | POA: Diagnosis not present

## 2019-07-19 DIAGNOSIS — Z955 Presence of coronary angioplasty implant and graft: Secondary | ICD-10-CM | POA: Diagnosis not present

## 2019-07-19 DIAGNOSIS — Z8711 Personal history of peptic ulcer disease: Secondary | ICD-10-CM | POA: Insufficient documentation

## 2019-07-19 DIAGNOSIS — Z7902 Long term (current) use of antithrombotics/antiplatelets: Secondary | ICD-10-CM | POA: Diagnosis not present

## 2019-07-19 DIAGNOSIS — E119 Type 2 diabetes mellitus without complications: Secondary | ICD-10-CM | POA: Diagnosis not present

## 2019-07-19 DIAGNOSIS — I10 Essential (primary) hypertension: Secondary | ICD-10-CM | POA: Insufficient documentation

## 2019-07-19 DIAGNOSIS — C3490 Malignant neoplasm of unspecified part of unspecified bronchus or lung: Secondary | ICD-10-CM | POA: Insufficient documentation

## 2019-07-19 DIAGNOSIS — J449 Chronic obstructive pulmonary disease, unspecified: Secondary | ICD-10-CM | POA: Diagnosis not present

## 2019-07-19 DIAGNOSIS — Z7982 Long term (current) use of aspirin: Secondary | ICD-10-CM | POA: Diagnosis not present

## 2019-07-19 DIAGNOSIS — Z87891 Personal history of nicotine dependence: Secondary | ICD-10-CM | POA: Insufficient documentation

## 2019-07-19 DIAGNOSIS — Z79899 Other long term (current) drug therapy: Secondary | ICD-10-CM | POA: Insufficient documentation

## 2019-07-19 DIAGNOSIS — M109 Gout, unspecified: Secondary | ICD-10-CM | POA: Diagnosis not present

## 2019-07-19 DIAGNOSIS — M1990 Unspecified osteoarthritis, unspecified site: Secondary | ICD-10-CM | POA: Diagnosis not present

## 2019-07-19 LAB — CMP (CANCER CENTER ONLY)
ALT: 16 U/L (ref 0–44)
AST: 22 U/L (ref 15–41)
Albumin: 3.3 g/dL — ABNORMAL LOW (ref 3.5–5.0)
Alkaline Phosphatase: 120 U/L (ref 38–126)
Anion gap: 12 (ref 5–15)
BUN: 9 mg/dL (ref 8–23)
CO2: 29 mmol/L (ref 22–32)
Calcium: 9 mg/dL (ref 8.9–10.3)
Chloride: 99 mmol/L (ref 98–111)
Creatinine: 0.86 mg/dL (ref 0.61–1.24)
GFR, Est AFR Am: >60 mL/min (ref >60)
GFR, Estimated: >60 mL/min (ref >60)
Glucose, Bld: 113 mg/dL — ABNORMAL HIGH (ref 70–99)
Potassium: 4.3 mmol/L (ref 3.5–5.1)
Sodium: 140 mmol/L (ref 135–145)
Total Bilirubin: 0.3 mg/dL (ref 0.3–1.2)
Total Protein: 7.9 g/dL (ref 6.5–8.1)

## 2019-07-19 LAB — CBC WITH DIFFERENTIAL (CANCER CENTER ONLY)
Abs Immature Granulocytes: 0.07 10*3/uL (ref 0.00–0.07)
Basophils Absolute: 0.1 10*3/uL (ref 0.0–0.1)
Basophils Relative: 1 %
Eosinophils Absolute: 0.2 10*3/uL (ref 0.0–0.5)
Eosinophils Relative: 2 %
HCT: 37 % — ABNORMAL LOW (ref 39.0–52.0)
Hemoglobin: 11.6 g/dL — ABNORMAL LOW (ref 13.0–17.0)
Immature Granulocytes: 1 %
Lymphocytes Relative: 27 %
Lymphs Abs: 2.2 10*3/uL (ref 0.7–4.0)
MCH: 29.3 pg (ref 26.0–34.0)
MCHC: 31.4 g/dL (ref 30.0–36.0)
MCV: 93.4 fL (ref 80.0–100.0)
Monocytes Absolute: 1.5 10*3/uL — ABNORMAL HIGH (ref 0.1–1.0)
Monocytes Relative: 18 %
Neutro Abs: 4.2 10*3/uL (ref 1.7–7.7)
Neutrophils Relative %: 51 %
Platelet Count: 369 10*3/uL (ref 150–400)
RBC: 3.96 MIL/uL — ABNORMAL LOW (ref 4.22–5.81)
RDW: 16.7 % — ABNORMAL HIGH (ref 11.5–15.5)
WBC Count: 8.2 10*3/uL (ref 4.0–10.5)
nRBC: 0 % (ref 0.0–0.2)

## 2019-07-19 MED ORDER — SODIUM CHLORIDE (PF) 0.9 % IJ SOLN
INTRAMUSCULAR | Status: AC
  Filled 2019-07-19: qty 50

## 2019-07-19 MED ORDER — IOHEXOL 300 MG/ML  SOLN
75.0000 mL | Freq: Once | INTRAMUSCULAR | Status: AC | PRN
  Administered 2019-07-19: 11:00:00 75 mL via INTRAVENOUS

## 2019-07-22 ENCOUNTER — Other Ambulatory Visit: Payer: Self-pay

## 2019-07-22 ENCOUNTER — Telehealth: Payer: Self-pay | Admitting: Internal Medicine

## 2019-07-22 ENCOUNTER — Encounter: Payer: Self-pay | Admitting: Internal Medicine

## 2019-07-22 ENCOUNTER — Inpatient Hospital Stay (HOSPITAL_BASED_OUTPATIENT_CLINIC_OR_DEPARTMENT_OTHER): Payer: Medicare Other | Admitting: Internal Medicine

## 2019-07-22 DIAGNOSIS — C3490 Malignant neoplasm of unspecified part of unspecified bronchus or lung: Secondary | ICD-10-CM | POA: Diagnosis not present

## 2019-07-22 NOTE — Telephone Encounter (Signed)
Scheduled appt per 8/10 los - gave patient AVS and calender per los

## 2019-07-22 NOTE — Progress Notes (Signed)
[facility] Telephone:([phone]) [phone]   Fax:(336) [phone]  OFFICE PROGRESS NOTE  Cyndi Bender, PA-C [address] [place] [address]  DIAGNOSIS: Recurrent non-small cell lung cancer, squamous cell carcinoma initially presented as unresectable Stage IIA (T1b, N1, M0) non-small cell lung cancer, squamous cell carcinoma presented with right upper lobe lung nodule and hilar lymphadenopathy diagnosed in January 2017.  PRIOR THERAPY:  1) Course of concurrent chemoradiation with weekly carboplatin for AUC of 2 and paclitaxel 45 MG/M2. Status post 7 cycles, last dose was given 03/14/2016 with partial response. 2) status post stereotactic radiotherapy to recurrent lung cancer in the right upper lobe under the care of Dr. Lisbeth Renshaw completed on 06/08/2018  CURRENT THERAPY: Observation.  INTERVAL HISTORY: Ryan Contreras 78 y.o. male returns to the clinic today for follow-up visit.  The patient is feeling fine today with no concerning complaints except for the shortness of breath with exertion.  He denied having any chest pain, cough or hemoptysis.  He denied having any recent weight loss or night sweats.  He has no nausea, vomiting, diarrhea or constipation.  He denied having any headache or visual changes.  He had repeat CT scan of the chest performed recently and he is here for evaluation and discussion of his scan results.   MEDICAL HISTORY: Past Medical History:  Diagnosis Date  . Arthritis   . CAD, NATIVE VESSEL    coronary stent x1-tx. Plavix use.  Marland Kitchen COPD (chronic obstructive pulmonary disease) ([place])    due to 50+ years Smoking.  Marland Kitchen Dyspnea    wears oxygen PRN  . Gastrointestinal bleeding, upper 04/29/2016  . Gout, unspecified    foot  . HYPERLIPIDEMIA   . HYPERTENSION   . Lung mass   . NSCL ca dx'd 12/2015  . On supplemental oxygen by nasal cannula    PRN  . PEPTIC ULCER DISEASE   . Pneumonia   . TOBACCO ABUSE   . Type 2 diabetes mellitus ([place])    denies on  01-06-16    ALLERGIES:  has No Known Allergies.  MEDICATIONS:  Current Outpatient Medications  Medication Sig Dispense Refill  . acetaminophen (TYLENOL) 500 MG tablet Take 250 mg by mouth every 6 (six) hours as needed for moderate pain or headache.     . alfuzosin (UROXATRAL) 10 MG 24 hr tablet Take 10 mg by mouth daily.    Marland Kitchen allopurinol (ZYLOPRIM) 100 MG tablet TAKE 1 TABLET BY MOUTH EVERY DAY 90 tablet 0  . aspirin 81 MG tablet Take 1 tablet (81 mg total) by mouth at bedtime. Restart on 03/09/18 30 tablet   . atorvastatin (LIPITOR) 80 MG tablet TAKE 1 TABLET BY MOUTH EVERY DAY 30 tablet 10  . clopidogrel (PLAVIX) 75 MG tablet TAKE 1 TABLET BY MOUTH EVERY DAY 90 tablet 0  . docusate sodium (COLACE) 100 MG capsule Take 100 mg by mouth at bedtime.    . donepezil (ARICEPT) 10 MG tablet Take 1 tablet (10 mg total) by mouth at bedtime. 30 tablet 11  . eplerenone (INSPRA) 25 MG tablet TAKE 1 TABLET BY MOUTH EVERY DAY 30 tablet 5  . finasteride (PROSCAR) 5 MG tablet Take 5 mg by mouth daily.  11  . KLOR-CON M20 20 MEQ tablet TAKE 1 TABLET BY MOUTH DAILY 30 tablet 11  . losartan-hydrochlorothiazide (HYZAAR) 100-12.5 MG tablet TAKE 1 TABLET BY MOUTH EVERY DAY 30 tablet 8  . memantine (NAMENDA) 5 MG tablet Take 1 tablet (  5 mg total) by mouth 2 (two) times daily. 60 tablet 11  . oxybutynin (DITROPAN) 5 MG tablet Take 5 mg by mouth at bedtime.  11  . pantoprazole (PROTONIX) 40 MG tablet TAKE 40 MG BY MOUTH EVERY DAY 90 tablet 3  . PROAIR HFA 108 (90 Base) MCG/ACT inhaler Inhale 2 puffs into the lungs every 6 (six) hours as needed for wheezing or shortness of breath.  0  . Tiotropium Bromide-Olodaterol (STIOLTO RESPIMAT) 2.5-2.5 MCG/ACT AERS Inhale 2 puffs into the lungs daily. 1 Inhaler 5   No current facility-administered medications for this visit.     SURGICAL HISTORY:  Past Surgical History:  Procedure Laterality Date  . CARDIAC CATHETERIZATION     '06-stent placed, 6'13 - Dr. Lizbeth Bark follows   . CORONARY STENT PLACEMENT     s/p  . ENDOBRONCHIAL ULTRASOUND Bilateral 01/11/2016   Procedure: ENDOBRONCHIAL ULTRASOUND;  Surgeon: Collene Gobble, MD;  Location: [facility];  Service: Endoscopy;  Laterality: Bilateral;  . VIDEO BRONCHOSCOPY WITH ENDOBRONCHIAL NAVIGATION Right 03/07/2018   Procedure: VIDEO BRONCHOSCOPY WITH ENDOBRONCHIAL NAVIGATION;  Surgeon: Collene Gobble, MD;  Location: [facility];  Service: Thoracic;  Laterality: Right;    REVIEW OF SYSTEMS:  A comprehensive review of systems was negative except for: Respiratory: positive for cough and dyspnea on exertion   PHYSICAL EXAMINATION: General appearance: alert, cooperative and no distress Head: Normocephalic, without obvious abnormality, atraumatic Neck: no adenopathy, no JVD, supple, symmetrical, trachea midline and thyroid not enlarged, symmetric, no tenderness/mass/nodules Lymph nodes: Cervical, supraclavicular, and axillary nodes normal. Resp: clear to auscultation bilaterally Back: symmetric, no curvature. ROM normal. No CVA tenderness. Cardio: regular rate and rhythm, S1, S2 normal, no murmur, click, rub or gallop GI: soft, non-tender; bowel sounds normal; no masses,  no organomegaly Extremities: extremities normal, atraumatic, no cyanosis or edema  ECOG PERFORMANCE STATUS: 1 - Symptomatic but completely ambulatory  Blood pressure (!) 152/53, pulse 87, temperature 98 F (36.7 C), temperature source Oral, resp. rate 20, height 6\' 1"  (1.854 m), weight 217 lb 9.6 oz (98.7 kg), SpO2 96 %.  LABORATORY DATA: Lab Results  Component Value Date   WBC 8.2 07/19/2019   HGB 11.6 (L) 07/19/2019   HCT 37.0 (L) 07/19/2019   MCV 93.4 07/19/2019   PLT 369 07/19/2019      Chemistry      Component Value Date/Time   NA 140 07/19/2019 0921   NA 141 06/16/2017 0906   K 4.3 07/19/2019 0921   K 3.4 (L) 06/16/2017 0906   CL 99 07/19/2019 0921   CO2 29 07/19/2019 0921   CO2 35 (H) 06/16/2017 0906   BUN 9 07/19/2019 0921   BUN  10.3 06/16/2017 0906   CREATININE 0.86 07/19/2019 0921   CREATININE 0.8 06/16/2017 0906      Component Value Date/Time   CALCIUM 9.0 07/19/2019 0921   CALCIUM 9.3 06/16/2017 0906   ALKPHOS 120 07/19/2019 0921   ALKPHOS 87 06/16/2017 0906   AST 22 07/19/2019 0921   AST 21 06/16/2017 0906   ALT 16 07/19/2019 0921   ALT 15 06/16/2017 0906   BILITOT 0.3 07/19/2019 0921   BILITOT 0.46 06/16/2017 0906       RADIOGRAPHIC STUDIES: Ct Chest W Contrast  Result Date: 07/19/2019 CLINICAL DATA:  Recurrent non-small cell lung cancer. Initial diagnosis January 2017. Completed chemoradiation therapy and stereotactic radiotherapy. EXAM: CT CHEST WITH CONTRAST TECHNIQUE: Multidetector CT imaging of the chest was performed during intravenous contrast administration. CONTRAST:  63mL  OMNIPAQUE IOHEXOL 300 MG/ML  SOLN COMPARISON:  Chest CT 01/18/2019 and 10/15/2018. FINDINGS: Cardiovascular: Atherosclerosis of the aorta, great vessels and coronary arteries again noted. There are no acute vascular findings. The heart size is normal. There is no pericardial effusion. Mediastinum/Nodes: There are no enlarged mediastinal, hilar or axillary lymph nodes.There is stable soft tissue thickening around the right hilum, extending into the right suprahilar region. The thyroid gland, trachea and esophagus demonstrate no significant findings. Lungs/Pleura: A small amount of loculated pleural fluid remains on the right. There is no pneumothorax. There are stable treatment changes in the right hemithorax with volume loss and opacity in the right upper and middle lobes. There is stable central hilar distortion. The previously described ground-glass density medially in the left lower lobe is less focal appearing currently and probably related to previous radiation therapy as well. No new or enlarging pulmonary nodules. Stable mild centrilobular and paraseptal emphysema. Upper abdomen: 5.1 cm cystic lesion projecting posteriorly from  the right hepatic lobe on image 142/2 is unchanged. Suspected early changes of cirrhosis. No adrenal mass. Musculoskeletal/Chest wall: Stable radiation changes in the right pectoralis musculature and stable presternal epidermal inclusion (sebaceous) cyst. No suspicious chest wall lesion or osseous finding. IMPRESSION: 1. Stable chest CT without evidence of local recurrence or metastatic disease. 2. Stable treatment changes in the right lung with volume loss, parenchymal opacification and hilar distortion. Less well-defined density medially in the left lower lobe is likely treatment related as well. 3. Stable appearance of the visualized upper abdomen. 4. Coronary and Aortic Atherosclerosis (ICD10-I70.0) and Emphysema (ICD10-J43.9). Electronically Signed   By: Richardean Sale M.D.   On: 07/19/2019 14:51    ASSESSMENT AND PLAN:  This is a very pleasant 78 years old African-American male with recurrent non-small cell lung cancer, squamous cell carcinoma presented initially as unresectable a stage IIA non-small cell lung cancer, squamous cell carcinoma status post a course of concurrent chemoradiation.  The patient has been in observation since April 2017. The patient is currently on observation and he is feeling fine. Repeat CT scan of the chest showed no concerning findings for disease progression. I recommended for the patient to continue on observation with repeat CT scan of the chest in 6 months for reevaluation of his disease. He was advised to call immediately if he has any concerning symptoms in the interval. The patient voices understanding of current disease status and treatment options and is in agreement with the current care plan. All questions were answered. The patient knows to call the clinic with any problems, questions or concerns. We can certainly see the patient much sooner if necessary.  Disclaimer: This note was dictated with voice recognition software. Similar sounding words can  inadvertently be transcribed and may be missed upon review.

## 2019-08-04 NOTE — Progress Notes (Signed)
Cardiology Office Note   Date:  08/05/2019   ID:  [name], DOB [birth year], MRN [id number]  PCP:  Cyndi Bender, PA-C  Cardiologist:   Dorris Carnes, MD   F/U of CAD    History of Present Illness: Ryan Contreras is a 78 y.o. male with a history of CAD, HTN, HL, CV diseae, tobacco abuse, COPD, lung cancer   Last cath in 2013 (LAD 60% mid; D1 40%; LC 40% OM1 40%; RCA stent patent) He was seen by Buren Kos in Nov 2019  Deu to SOB set up for mYovue in December 2019  This was normal    PT denies CP   Breathing has been OK   He is not smoking    No dizziness      Current Meds  Medication Sig  . acetaminophen (TYLENOL) 500 MG tablet Take 250 mg by mouth every 6 (six) hours as needed for moderate pain or headache.   . alfuzosin (UROXATRAL) 10 MG 24 hr tablet Take 10 mg by mouth daily.  Marland Kitchen allopurinol (ZYLOPRIM) 100 MG tablet TAKE 1 TABLET BY MOUTH EVERY DAY  . aspirin 81 MG tablet Take 1 tablet (81 mg total) by mouth at bedtime. Restart on 03/09/18  . atorvastatin (LIPITOR) 80 MG tablet TAKE 1 TABLET BY MOUTH EVERY DAY  . clopidogrel (PLAVIX) 75 MG tablet TAKE 1 TABLET BY MOUTH EVERY DAY  . docusate sodium (COLACE) 100 MG capsule Take 100 mg by mouth at bedtime.  . donepezil (ARICEPT) 10 MG tablet Take 1 tablet (10 mg total) by mouth at bedtime.  Marland Kitchen eplerenone (INSPRA) 25 MG tablet TAKE 1 TABLET BY MOUTH EVERY DAY  . finasteride (PROSCAR) 5 MG tablet Take 5 mg by mouth daily.  Marland Kitchen KLOR-CON M20 20 MEQ tablet TAKE 1 TABLET BY MOUTH DAILY  . losartan-hydrochlorothiazide (HYZAAR) 100-12.5 MG tablet TAKE 1 TABLET BY MOUTH EVERY DAY  . memantine (NAMENDA) 5 MG tablet Take 1 tablet (5 mg total) by mouth 2 (two) times daily.  Marland Kitchen oxybutynin (DITROPAN) 5 MG tablet Take 5 mg by mouth at bedtime.  . pantoprazole (PROTONIX) 40 MG tablet TAKE 40 MG BY MOUTH EVERY DAY  . PROAIR HFA 108 (90 Base) MCG/ACT inhaler Inhale 2 puffs into the lungs every 6 (six) hours as needed for wheezing or shortness of  breath.  . Tiotropium Bromide-Olodaterol (STIOLTO RESPIMAT) 2.5-2.5 MCG/ACT AERS Inhale 2 puffs into the lungs daily.     Allergies:   Patient has no known allergies.   Past Medical History:  Diagnosis Date  . Arthritis   . CAD, NATIVE VESSEL    coronary stent x1-tx. Plavix use.  Marland Kitchen COPD (chronic obstructive pulmonary disease) ([place])    due to 50+ years Smoking.  Marland Kitchen Dyspnea    wears oxygen PRN  . Gastrointestinal bleeding, upper 04/29/2016  . Gout, unspecified    foot  . HYPERLIPIDEMIA   . HYPERTENSION   . Lung mass   . NSCL ca dx'd 12/2015  . On supplemental oxygen by nasal cannula    PRN  . PEPTIC ULCER DISEASE   . Pneumonia   . TOBACCO ABUSE   . Type 2 diabetes mellitus ([place])    denies on 01-06-16    Past Surgical History:  Procedure Laterality Date  . CARDIAC CATHETERIZATION     '06-stent placed, 6'13 - Dr. Lizbeth Bark follows  . CORONARY STENT PLACEMENT     s/p  . ENDOBRONCHIAL ULTRASOUND Bilateral 01/11/2016   Procedure: ENDOBRONCHIAL  ULTRASOUND;  Surgeon: Collene Gobble, MD;  Location: [facility];  Service: Endoscopy;  Laterality: Bilateral;  . VIDEO BRONCHOSCOPY WITH ENDOBRONCHIAL NAVIGATION Right 03/07/2018   Procedure: VIDEO BRONCHOSCOPY WITH ENDOBRONCHIAL NAVIGATION;  Surgeon: Collene Gobble, MD;  Location: [facility];  Service: Thoracic;  Laterality: Right;     Social History:  The patient  reports that he quit smoking about 18 months ago. His smoking use included cigarettes. He has a 25.00 pack-year smoking history. He has never used smokeless tobacco. He reports previous alcohol use. He reports that he does not use drugs.   Family History:  The patient's family history includes Arthritis in his mother; Breast cancer in his sister; Cancer in his brother; Heart attack in his father; Hypertension in his father.    ROS:  Please see the history of present illness. All other systems are reviewed and  Negative to the above problem except as noted.    PHYSICAL EXAM: VS:   BP 134/60   Pulse (!) 106   Ht 6\' 1"  (1.854 m)   Wt 219 lb 6.4 oz (99.5 kg)   SpO2 91%   BMI 28.95 kg/m   GEN: Well nourished, well developed, in no acute distress  HEENT: normal  Neck:  JVP normal , carotid bruits, or masses Cardiac: RRR; no murmurs, rubs, or gallops,no edema  Respiratory:  Airflow is decreased but moving   GI: soft, nontender, nondistended, + BS  No hepatomegaly  MS: no deformity Moving all extremities   Skin: warm and dry, no rash Neuro:  Strength and sensation are intact Psych: euthymic mood, full affect   EKG:  EKG is not  ordered today.   Lipid Panel    Component Value Date/Time   CHOL 167 12/27/2017 0844   TRIG 97.0 12/27/2017 0844   TRIG 36 11/28/2006 0837   HDL 60.90 12/27/2017 0844   CHOLHDL 3 12/27/2017 0844   VLDL 19.4 12/27/2017 0844   LDLCALC 86 12/27/2017 0844      Wt Readings from Last 3 Encounters:  08/05/19 219 lb 6.4 oz (99.5 kg)  07/22/19 217 lb 9.6 oz (98.7 kg)  06/25/19 219 lb 6.4 oz (99.5 kg)      ASSESSMENT AND PLAN:  1  CAD  Last heart cath in 2013  Mod LAD dz LCx 40%  RCA 40%  LVEF normal   Stent patent  In prox RCA   I am not convinced of any angina Keep on ASA  And pLavix  Early stent  2  Pulmonary   FOllowed by R Byrum or COPD and M Mohamed for squamous Cell CA lung     Discussed precaurtions due to covid    He has quit tobacco a  3  HL  Continue lipitor 80  Keep on current dose  4  HTN  BP is OK   COntinue current regimen   F/U in March / April 2021   Current medicines are reviewed at length with the patient today.  The patient does not have concerns regarding medicines.  Signed, Dorris Carnes, MD  08/05/2019 4:20 PM    [facility] [place], [place], [place]  [address] Phone: [phone]; Fax: [phone]

## 2019-08-05 ENCOUNTER — Ambulatory Visit (INDEPENDENT_AMBULATORY_CARE_PROVIDER_SITE_OTHER): Payer: Medicare Other | Admitting: Internal Medicine

## 2019-08-05 ENCOUNTER — Encounter: Payer: Self-pay | Admitting: Internal Medicine

## 2019-08-05 ENCOUNTER — Other Ambulatory Visit: Payer: Self-pay

## 2019-08-05 VITALS — BP 134/60 | HR 106 | Ht 73.0 in | Wt 219.4 lb

## 2019-08-05 DIAGNOSIS — I10 Essential (primary) hypertension: Secondary | ICD-10-CM | POA: Diagnosis not present

## 2019-08-05 DIAGNOSIS — E782 Mixed hyperlipidemia: Secondary | ICD-10-CM

## 2019-08-05 DIAGNOSIS — I2510 Atherosclerotic heart disease of native coronary artery without angina pectoris: Secondary | ICD-10-CM

## 2019-08-05 NOTE — Patient Instructions (Signed)
Medication Instructions:  No changes If you need a refill on your cardiac medications before your next appointment, please call your pharmacy.   Lab work: none If you have labs (blood work) drawn today and your tests are completely normal, you will receive your results only by: Marland Kitchen MyChart Message (if you have MyChart) OR . A paper copy in the mail If you have any lab test that is abnormal or we need to change your treatment, we will call you to review the results.  Testing/Procedures: none  Follow-Up: At [facility], you and your health needs are our priority.  As part of our continuing mission to provide you with exceptional heart care, we have created designated Provider Care Teams.  These Care Teams include your primary Cardiologist (physician) and Advanced Practice Providers (APPs -  Physician Assistants and Nurse Practitioners) who all work together to provide you with the care you need, when you need it. You will need a follow up appointment in:  March/April--6 months.  Please call our office 2 months in advance to schedule this appointment.  You may see Dorris Carnes, MD or one of the following Advanced Practice Providers on your designated Care Team: Richardson Dopp, PA-C [place], [place] . Daune Perch, NP  Any Other Special Instructions Will Be Listed Below (If Applicable).

## 2019-08-08 ENCOUNTER — Other Ambulatory Visit: Payer: Self-pay | Admitting: Endocrinology

## 2019-08-08 NOTE — Telephone Encounter (Signed)
Please forward refill request to pt's primary care provider.   

## 2019-08-08 NOTE — Telephone Encounter (Signed)
Please advise if you wish to manage and refill 

## 2019-08-11 ENCOUNTER — Other Ambulatory Visit: Payer: Self-pay | Admitting: Emergency Medicine

## 2019-08-15 ENCOUNTER — Other Ambulatory Visit: Payer: Self-pay

## 2019-08-15 ENCOUNTER — Encounter: Payer: Self-pay | Admitting: Nurse Practitioner

## 2019-08-15 ENCOUNTER — Ambulatory Visit (INDEPENDENT_AMBULATORY_CARE_PROVIDER_SITE_OTHER): Payer: Medicare Other | Admitting: Nurse Practitioner

## 2019-08-15 ENCOUNTER — Telehealth: Payer: Self-pay

## 2019-08-15 VITALS — BP 118/74 | HR 100 | Temp 98.4°F | Ht 73.0 in | Wt 217.4 lb

## 2019-08-15 DIAGNOSIS — Z7901 Long term (current) use of anticoagulants: Secondary | ICD-10-CM | POA: Diagnosis not present

## 2019-08-15 DIAGNOSIS — D509 Iron deficiency anemia, unspecified: Secondary | ICD-10-CM | POA: Diagnosis not present

## 2019-08-15 DIAGNOSIS — K5900 Constipation, unspecified: Secondary | ICD-10-CM

## 2019-08-15 MED ORDER — NA SULFATE-K SULFATE-MG SULF 17.5-3.13-1.6 GM/177ML PO SOLN: ORAL | 0 refills | Status: DC

## 2019-08-15 NOTE — Addendum Note (Signed)
Addended by: Candie Mile on: 08/15/2019 04:24 PM   Modules accepted: Orders, SmartSet

## 2019-08-15 NOTE — Telephone Encounter (Signed)
From cardiac standpoint, pt is low risk for endoscopy    He does have significant COPD and will need to be followed closely from pulmonary standpoint

## 2019-08-15 NOTE — Telephone Encounter (Signed)
[facility] Pre-operative Risk Assessment     Request for surgical clearance:     Endoscopy Procedure  What type of surgery is being performed?     Endo/Colon  When is this surgery scheduled?     09/24/19  What type of clearance is required ?   Pharmacy  Are there any medications that need to be held prior to surgery and how long? HOLD PLAVIX 5 DAYS PRIOR  Practice name and name of physician performing surgery?      [facility]/ Dr. Henrene Pastor  What is your office phone and fax number?      Phone- [phone]  Fax- [phone] Attn: Peter Congo, RMA  Anesthesia type (None, local, MAC, general) ?       MAC ATTN: Dr. Harrington Challenger - please advise.

## 2019-08-15 NOTE — Patient Instructions (Addendum)
If you are age 76 or older, your body mass index should be between 23-30. Your Body mass index is 28.68 kg/m. If this is out of the aforementioned range listed, please consider follow up with your Primary Care Provider.  If you are age 66 or younger, your body mass index should be between 19-25. Your Body mass index is 28.68 kg/m. If this is out of the aformentioned range listed, please consider follow up with your Primary Care Provider.   You have been scheduled for an endoscopy and colonoscopy. Please follow the written instructions given to you at your visit today. Please pick up your prep supplies at the pharmacy within the next 1-3 days. If you use inhalers (even only as needed), please bring them with you on the day of your procedure. Your physician has requested that you go to www.startemmi.com and enter the access code given to you at your visit today. This web site gives a general overview about your procedure. However, you should still follow specific instructions given to you by our office regarding your preparation for the procedure.  We have sent the following medications to your pharmacy for you to pick up at your convenience: [place] will be contacted by our office prior to your procedure for directions on holding your Plavix.  If you do not hear from our office 1 week prior to your scheduled procedure, please call [phone] to discuss.   Due to recent COVID-19 restrictions implemented by our local and state authorities and in an effort to keep both patients and staff as safe as possible, our hospital system now requires COVID-19 testing prior to any scheduled hospital procedure. Please go to our [facility] location drive thru testing site ([address] [place], [place] [address]) on 09/20/19 at  10 am. There will be multiple testing areas, the first checkpoint being for pre-procedure/surgery testing. Get into the right (yellow) lane that leads to the PAT testing team.  You will not be billed at the time of testing but may receive a bill later depending on your insurance. The approximate cost of the test is $100. You must agree to quarantine from the time of your testing until the procedure date on 09/24/19 . This should include staying at home with ONLY the people you live with. Avoid take-out, grocery store shopping or leaving the house for any non-emergent reason. Failure to have your COVID-19 test done on the date and time you have been scheduled will result in cancellation of procedure. Please call our office at [phone] if you have any questions.   INCREASE Miralax to twice daily.  Thank you for choosing me and [facility].   Tye Savoy, NP

## 2019-08-15 NOTE — Progress Notes (Signed)
ASSESSMENT / PLAN:   60.  78 yo male with multiple medical problems referred for evaluation of normocytic anemia, probably iron deficiency with ferritin of 34 and iron saturation of 14%.  On chronic plavix. Hemoglobin stable at 11.9 (on iron).  No overt GI bleeding.   Rule out recurrent PUD, colon neoplasm, AVMs, etc.  -Patient will be scheduled for EGD and colonoscopy off Plavix.Ryan Contreras He requires supplemental oxygen necessitating need for procedures to be done at the hospital. The risks and benefits of EGD and colonoscopy with possible polypectomy / biopsies were discussed and the patient agrees to proceed.  -We will hold iron prior to procedures -Continue daily Protonix.  He is likely on this for history of upper GI bleed.  Denies history of GERD  2. Remote hx of colon polyps.  Several years overdue for follow-up colonoscopy, we sent him a letter in 2013.   3.  CAD / hx of stent on chronic anticoagulation, on plavix --Hold Plavix for 5 days before procedure - will instruct when and how to resume after procedure. Patient understands that there is a low but real risk of cardiovascular event such as heart attack, stroke, or embolism /  thrombosis, or ischemia while off Plavix. The patient consents to proceed. Will communicate by phone or EMR with patient's prescribing provider to confirm that holding Plavix is reasonable in this case.   4.  Constipation, likely secondary to iron -Increase MiraLAX to twice daily   HPI:    Chief Complaint:   anemia  78 year old male with PMH significant for multiple medical problems including gout, cognitive impairment,  COPD requiring supplemental 02, Stage IIA (T1b,N1, M0) non-small lung cancer treated with chemoradiation, DM 2, CAD with history of stenting, hypertension, hyperlipidemia, and PUD .  He is on chronic Plavix.   He has been sent by PCP Cyndi Bender, PA [facility]) for evaluation of iron deficiency anemia.  PCP has been  treating patient with iron for iron deficiency anemia.  Labs inserted into most recent office visit 07/25/2019 reveal hemoglobin of 11.9, MCV 90, TIBC 335, iron saturation 14%, ferritin 34.  Notation was made that the anemia had improved.  His baseline hemoglobin over the last year has been in the mid 11 range except for a couple of instances where it fell in the mid 9 to mid 10 range.  I do not know exactly when he started on iron but his hemoglobin is back to baseline  Patient on remotely to Dr. Henrene Pastor (2007).  Has screening colonoscopy in 2007 with removal of a small polyp.  Pathology report not in epic.  We sent him a colonoscopy recall letter in 2013.  Patient has history of an upper GI bleed secondary to PUD at [facility] in 2017. Actual report not in Care Everywhere but subsequent notes describe large necrotic ulcerated mass in the duodenum extending towards the pylorus.  Unable to locate path report.  He was positive for H. pylori and received treatment.  Apparently had follow-up EGD a few weeks later, report not available in Care Everywhere.  Patient has not had any overt GI bleeding.  He takes a daily AV aspirin, no other NSAIDs.  He is on daily Protonix but denies a history of GERD.  No nausea, vomiting.  He has recently developed constipation, probably secondary to iron.  PCP put him on something at night to mix with water, possibly  MiraLAX but it has not helped   Data Reviewed:  07/19/2019 Epic labs BUN 9, creatinine 0.86, albumin 3.3, liver test otherwise normal.  Normal stress test December 2019   Past Medical History:  Diagnosis Date  . Arthritis   . CAD, NATIVE VESSEL    coronary stent x1-tx. Plavix use.  Ryan Contreras COPD (chronic obstructive pulmonary disease) ([place])    due to 50+ years Smoking.  Ryan Contreras Dyspnea    wears oxygen PRN  . Gastrointestinal bleeding, upper 04/29/2016  . Gout, unspecified    foot  . HYPERLIPIDEMIA   . HYPERTENSION   . Lung mass   . NSCL ca dx'd 12/2015  . On  supplemental oxygen by nasal cannula    PRN  . PEPTIC ULCER DISEASE   . Pneumonia   . TOBACCO ABUSE   . Type 2 diabetes mellitus ([place])    denies on 01-06-16     Past Surgical History:  Procedure Laterality Date  . CARDIAC CATHETERIZATION     '06-stent placed, 6'13 - Dr. Lizbeth Bark follows  . CORONARY STENT PLACEMENT     s/p  . ENDOBRONCHIAL ULTRASOUND Bilateral 01/11/2016   Procedure: ENDOBRONCHIAL ULTRASOUND;  Surgeon: Collene Gobble, MD;  Location: [facility] ENDOSCOPY;  Service: Endoscopy;  Laterality: Bilateral;  . VIDEO BRONCHOSCOPY WITH ENDOBRONCHIAL NAVIGATION Right 03/07/2018   Procedure: VIDEO BRONCHOSCOPY WITH ENDOBRONCHIAL NAVIGATION;  Surgeon: Collene Gobble, MD;  Location: [facility] OR;  Service: Thoracic;  Laterality: Right;   Family History  Problem Relation Age of Onset  . Breast cancer Sister   . Cancer Brother        Uncertain type  . Arthritis Mother   . Hypertension Father   . Heart attack Father    Social History   Tobacco Use  . Smoking status: Former Smoker    Packs/day: 0.50    Years: 50.00    Pack years: 25.00    Types: Cigarettes    Quit date: 01/12/2018    Years since quitting: 1.5  . Smokeless tobacco: Never Used  . Tobacco comment: stopped smoking cigarettes 2/ 2019  Substance Use Topics  . Alcohol use: Not Currently    Alcohol/week: 0.0 standard drinks  . Drug use: No   Current Outpatient Medications  Medication Sig Dispense Refill  . acetaminophen (TYLENOL) 500 MG tablet Take 250 mg by mouth every 6 (six) hours as needed for moderate pain or headache.     . alfuzosin (UROXATRAL) 10 MG 24 hr tablet Take 10 mg by mouth daily.    Ryan Contreras allopurinol (ZYLOPRIM) 100 MG tablet TAKE 1 TABLET BY MOUTH EVERY DAY 90 tablet 0  . aspirin 81 MG tablet Take 1 tablet (81 mg total) by mouth at bedtime. Restart on 03/09/18 30 tablet   . atorvastatin (LIPITOR) 80 MG tablet TAKE 1 TABLET BY MOUTH EVERY DAY 30 tablet 10  . clopidogrel (PLAVIX) 75 MG tablet TAKE 1 TABLET BY MOUTH  EVERY DAY 90 tablet 0  . docusate sodium (COLACE) 100 MG capsule Take 100 mg by mouth at bedtime.    . donepezil (ARICEPT) 10 MG tablet Take 1 tablet (10 mg total) by mouth at bedtime. 30 tablet 11  . eplerenone (INSPRA) 25 MG tablet TAKE 1 TABLET BY MOUTH EVERY DAY 30 tablet 5  . finasteride (PROSCAR) 5 MG tablet Take 5 mg by mouth daily.  11  . KLOR-CON M20 20 MEQ tablet TAKE 1 TABLET BY MOUTH DAILY 30 tablet 11  . losartan-hydrochlorothiazide (HYZAAR) 100-12.5 MG tablet  TAKE 1 TABLET BY MOUTH EVERY DAY 30 tablet 8  . memantine (NAMENDA) 5 MG tablet Take 1 tablet (5 mg total) by mouth 2 (two) times daily. 60 tablet 11  . oxybutynin (DITROPAN) 5 MG tablet Take 5 mg by mouth at bedtime.  11  . pantoprazole (PROTONIX) 40 MG tablet TAKE 40 MG BY MOUTH EVERY DAY 90 tablet 3  . PROAIR HFA 108 (90 Base) MCG/ACT inhaler Inhale 2 puffs into the lungs every 6 (six) hours as needed for wheezing or shortness of breath.  0  . Tiotropium Bromide-Olodaterol (STIOLTO RESPIMAT) 2.5-2.5 MCG/ACT AERS Inhale 2 puffs into the lungs daily. 1 Inhaler 5   No current facility-administered medications for this visit.    No Known Allergies   Review of Systems: All systems reviewed and negative except where noted in HPI.   Creatinine clearance cannot be calculated (Patient's most recent lab result is older than the maximum 21 days allowed.)   Physical Exam:    Wt Readings from Last 3 Encounters:  08/05/19 219 lb 6.4 oz (99.5 kg)  07/22/19 217 lb 9.6 oz (98.7 kg)  06/25/19 219 lb 6.4 oz (99.5 kg)    BP 118/74   Pulse 100   Temp 98.4 F (36.9 C)   Ht 6\' 1"  (1.854 m)   Wt 217 lb 6 oz (98.6 kg)   BMI 28.68 kg/m  Constitutional:  Pleasant male in no acute distress. Psychiatric: Normal mood and affect. Behavior is normal. EENT: Pupils normal.  Conjunctivae are normal. No scleral icterus. Neck supple.  Cardiovascular: Normal rate, regular rhythm. No edema Pulmonary/chest: Effort normal and breath sounds  normal. No wheezing, rales or rhonchi. Abdominal: Soft, nondistended, nontender. Bowel sounds active throughout. There are no masses palpable. No hepatomegaly. Neurological: Alert and oriented to person place and time. Skin: Skin is warm and dry. No rashes noted.  Tye Savoy, NP  08/15/2019, 9:06 AM  Cc: Cyndi Bender, PA-C

## 2019-08-15 NOTE — Progress Notes (Signed)
Assessment and plan noted ?

## 2019-08-16 ENCOUNTER — Other Ambulatory Visit: Payer: Self-pay | Admitting: Emergency Medicine

## 2019-08-16 ENCOUNTER — Other Ambulatory Visit: Payer: Self-pay | Admitting: Endocrinology

## 2019-08-16 ENCOUNTER — Telehealth: Payer: Self-pay | Admitting: Emergency Medicine

## 2019-08-16 MED ORDER — EPLERENONE 25 MG PO TABS: 25.0000 mg | ORAL_TABLET | Freq: Every day | ORAL | 0 refills | Status: DC

## 2019-08-16 NOTE — Telephone Encounter (Signed)
Please forward refill request to pt's primary care provider.   

## 2019-08-16 NOTE — Telephone Encounter (Signed)
Spoke iwht pt. He is requesting refills on Eplerenone. Advised him that it has been over a year since we have seen him, he would need an OV before this medication could be refilled. Pt has been scheduled to see RB on 09/05/2019 at 0915. Rx has been sent in. Nothing further was needed.

## 2019-08-21 NOTE — Telephone Encounter (Signed)
His COPD, history of lung cancer and some parenchymal scarring I will place him at moderately high risk for conscious sedation.  This does not preclude the procedure, but care should be taken to minimize sedation, watch his oxygenation and ventilation closely peri-procedure.

## 2019-08-22 NOTE — Telephone Encounter (Signed)
Left message on pateints voicemail to return my call to discuss holding his plavix prior to his procedure.

## 2019-08-22 NOTE — Telephone Encounter (Signed)
Appreciate consultants.  Procedures planned at the hospital with monitored anesthesia care

## 2019-08-23 NOTE — Telephone Encounter (Signed)
Patient returned call.  Advised to hold Plavix five days prior to procedure.  Patient verbalized understanding.

## 2019-08-30 ENCOUNTER — Other Ambulatory Visit: Payer: Self-pay | Admitting: Internal Medicine

## 2019-09-05 ENCOUNTER — Ambulatory Visit: Payer: Medicare Other | Admitting: Emergency Medicine

## 2019-09-10 ENCOUNTER — Other Ambulatory Visit: Payer: Self-pay | Admitting: Emergency Medicine

## 2019-09-17 ENCOUNTER — Ambulatory Visit (INDEPENDENT_AMBULATORY_CARE_PROVIDER_SITE_OTHER): Payer: Medicare Other | Admitting: Emergency Medicine

## 2019-09-17 ENCOUNTER — Ambulatory Visit: Payer: Medicare Other | Admitting: Emergency Medicine

## 2019-09-17 ENCOUNTER — Encounter: Payer: Self-pay | Admitting: Emergency Medicine

## 2019-09-17 ENCOUNTER — Other Ambulatory Visit: Payer: Self-pay | Admitting: *Deleted

## 2019-09-17 ENCOUNTER — Telehealth: Payer: Self-pay | Admitting: Nurse Practitioner

## 2019-09-17 ENCOUNTER — Telehealth: Payer: Self-pay | Admitting: *Deleted

## 2019-09-17 DIAGNOSIS — I10 Essential (primary) hypertension: Secondary | ICD-10-CM | POA: Diagnosis not present

## 2019-09-17 DIAGNOSIS — C3411 Malignant neoplasm of upper lobe, right bronchus or lung: Secondary | ICD-10-CM | POA: Diagnosis not present

## 2019-09-17 DIAGNOSIS — J449 Chronic obstructive pulmonary disease, unspecified: Secondary | ICD-10-CM | POA: Diagnosis not present

## 2019-09-17 MED ORDER — TRELEGY ELLIPTA 100-62.5-25 MCG/INH IN AEPB: 1.0000 | INHALATION_SPRAY | Freq: Every day | RESPIRATORY_TRACT | 11 refills | Status: DC

## 2019-09-17 NOTE — Telephone Encounter (Signed)
-----   Message from Collene Gobble, MD sent at 09/17/2019 10:20 AM EDT ----- Regarding: phone visits - OK for [place] to refill his Trelegy going forward (PCP ordered it) - wants a more portable O2 system > OK to ytry to get the most poirtable possible for him. Unsure when he last qualified, may have to have him come in to qualify again - PCP will fill his eplerenone going forward

## 2019-09-17 NOTE — Telephone Encounter (Signed)
Spoke with patient regarding holding Plavix.  Pt instructed to start holding Plavix on 09/19/19.  Also confirmed covid screening for Friday, 09/19/09 at 10 am and procedure date 09/24/19 at [facility].  Pt. Verbalized understanding.

## 2019-09-17 NOTE — Assessment & Plan Note (Signed)
Temporaril;y refilled his eplerenone last month, will defer to his PCP for this going forward

## 2019-09-17 NOTE — Telephone Encounter (Signed)
LMTCB x1 for pt to find out who his current oxygen set up is through.   Refill of Trelegy has been sent in.

## 2019-09-17 NOTE — Progress Notes (Signed)
Virtual Visit via Telephone Note  I connected with Ryan Contreras on 09/17/19 at 10:00 AM EDT by telephone and verified that I am speaking with the correct person using two identifiers.  Location: Patient: Home Provider: Home Office   I discussed the limitations, risks, security and privacy concerns of performing an evaluation and management service by telephone and the availability of in person appointments. I also discussed with the patient that there may be a patient responsible charge related to this service. The patient expressed understanding and agreed to proceed.   History of Present Illness: [name] is a 78 year old man with a history of COPD and recurrent squamous cell lung cancer.  He has undergone initially chemoradiation, and then stereotactic radiation in June 2019, and is followed by Dr. Julien Nordmann.  He is currently on observation.  We have been managing him on Stiolto.  He has oxygen to use 2 L/min at night and with exertion.   Observations/Objective: Today he reports that he is doing fairly well.  Apparently he was changed from [employer] to Trelegy several months ago.  He believes that he is doing well on the Trelegy.  He still has some exertional shortness of breath.  He wears oxygen at night reliably but rarely wears it with exertion even though he qualifies for exertional O2.  He has occasional cough usually productive of clear mucus.  He reports that rarely he has seen some blood-tinged mucus.  The last time was 2 weeks ago.  He denies any epistaxis.  He has not yet had his flu shot.  His pneumonia vaccine is up-to-date  He needed refills on his eplerenone last month, and I refilled it for him until he could get longstanding refills from his PCP.   His recent CT scan of the chest was 07/19/2019 and I have reviewed.  This shows stable treatment changes and in the right chest with some right upper lobe and middle lobe volume loss, stable central hilar distortion, some radiation  change in the left lower lobe.  Small amount of loculated right pleural fluid but no evidence for recurrent disease.  Assessment and Plan: Malignant neoplasm of right upper lobe of lung ([place]) Stable based on imaging and clinical status although he reports some rare hemoptysis.  Unclear whether this relates to his diagnosis of squamous cell lung cancer but no evidence of progression based on his last CT from August.  He will continue to follow closely with Dr. Julien Nordmann.  If any progression of clinical symptoms especially hemoptysis he may merit an inspection bronchoscopy.  I can discuss with him going forward.  COPD  GOLD C He seems to have benefited from the change from [facility] to Trelegy.  We will continue this, we can refill it for him going forward.  He has albuterol to use as needed.  His pneumonia shot is up-to-date, needs the flu shot.  He is got a plan to get this soon.  He has exertional dyspnea and probably the most impactful change that we can make at this point would be to improve his compliance with exertional oxygen.  He is interested in getting a more portable oxygen system we will try to do this for him.  Essential hypertension Temporaril;y refilled his eplerenone last month, will defer to his PCP for this going forward    Follow Up Instructions: 4 months or prn   I discussed the assessment and treatment plan with the patient. The patient was provided an opportunity to ask questions and  all were answered. The patient agreed with the plan and demonstrated an understanding of the instructions.   The patient was advised to call back or seek an in-person evaluation if the symptoms worsen or if the condition fails to improve as anticipated.  I provided 20 minutes of non-face-to-face time during this encounter.   Collene Gobble, MD

## 2019-09-17 NOTE — Assessment & Plan Note (Signed)
He seems to have benefited from the change from [facility] to Trelegy.  We will continue this, we can refill it for him going forward.  He has albuterol to use as needed.  His pneumonia shot is up-to-date, needs the flu shot.  He is got a plan to get this soon.  He has exertional dyspnea and probably the most impactful change that we can make at this point would be to improve his compliance with exertional oxygen.  He is interested in getting a more portable oxygen system we will try to do this for him.

## 2019-09-17 NOTE — Assessment & Plan Note (Signed)
Stable based on imaging and clinical status although he reports some rare hemoptysis.  Unclear whether this relates to his diagnosis of squamous cell lung cancer but no evidence of progression based on his last CT from August.  He will continue to follow closely with Dr. Julien Nordmann.  If any progression of clinical symptoms especially hemoptysis he may merit an inspection bronchoscopy.  I can discuss with him going forward.

## 2019-09-20 ENCOUNTER — Encounter (HOSPITAL_COMMUNITY): Payer: Self-pay | Admitting: *Deleted

## 2019-09-20 ENCOUNTER — Telehealth: Payer: Self-pay | Admitting: Internal Medicine

## 2019-09-20 ENCOUNTER — Other Ambulatory Visit (HOSPITAL_COMMUNITY)
Admission: RE | Admit: 2019-09-20 | Discharge: 2019-09-20 | Disposition: A | Discharge status: Home / Self Care | Payer: Medicare Other | Source: Ambulatory Visit | Attending: Internal Medicine | Admitting: Internal Medicine

## 2019-09-20 ENCOUNTER — Other Ambulatory Visit: Payer: Self-pay

## 2019-09-20 DIAGNOSIS — Z20828 Contact with and (suspected) exposure to other viral communicable diseases: Secondary | ICD-10-CM | POA: Diagnosis not present

## 2019-09-20 DIAGNOSIS — Z01812 Encounter for preprocedural laboratory examination: Secondary | ICD-10-CM | POA: Insufficient documentation

## 2019-09-20 MED ORDER — NA SULFATE-K SULFATE-MG SULF 17.5-3.13-1.6 GM/177ML PO SOLN: ORAL | 0 refills | Status: DC

## 2019-09-20 NOTE — Progress Notes (Signed)
Pre-op called attempted for pt's 09-24-19 procedure with Dr. Henrene Pastor. Left call back message with granddaughter.

## 2019-09-20 NOTE — Telephone Encounter (Signed)
Resent prep

## 2019-09-23 ENCOUNTER — Telehealth: Payer: Self-pay | Admitting: Internal Medicine

## 2019-09-23 LAB — NOVEL CORONAVIRUS, NAA (HOSP ORDER, SEND-OUT TO REF LAB; TAT 18-24 HRS): SARS-CoV-2, NAA: NOT DETECTED

## 2019-09-23 MED ORDER — NA SULFATE-K SULFATE-MG SULF 17.5-3.13-1.6 GM/177ML PO SOLN: ORAL | 0 refills | Status: DC

## 2019-09-23 NOTE — Telephone Encounter (Signed)
Prep shows being received by pharmacy on 09/20/2019 but I resent it just in case.

## 2019-09-23 NOTE — Progress Notes (Signed)
Pre call done. Pt states he has been able to quarantine and has not had any fever or flu like symptoms. Questions answered

## 2019-09-24 ENCOUNTER — Ambulatory Visit (HOSPITAL_COMMUNITY): Payer: Medicare Other | Admitting: Certified Registered"

## 2019-09-24 ENCOUNTER — Encounter (HOSPITAL_COMMUNITY)
Admission: RE | Disposition: A | Discharge status: Home / Self Care | Payer: Self-pay | Source: Home / Self Care | Attending: Internal Medicine

## 2019-09-24 ENCOUNTER — Encounter (HOSPITAL_COMMUNITY): Payer: Self-pay | Admitting: Internal Medicine

## 2019-09-24 ENCOUNTER — Other Ambulatory Visit: Payer: Self-pay

## 2019-09-24 ENCOUNTER — Ambulatory Visit (HOSPITAL_COMMUNITY)
Admission: RE | Admit: 2019-09-24 | Discharge: 2019-09-24 | Disposition: A | Discharge status: Home / Self Care | Payer: Medicare Other | Attending: Internal Medicine | Admitting: Internal Medicine

## 2019-09-24 DIAGNOSIS — F17210 Nicotine dependence, cigarettes, uncomplicated: Secondary | ICD-10-CM | POA: Insufficient documentation

## 2019-09-24 DIAGNOSIS — Z7901 Long term (current) use of anticoagulants: Secondary | ICD-10-CM

## 2019-09-24 DIAGNOSIS — Z955 Presence of coronary angioplasty implant and graft: Secondary | ICD-10-CM | POA: Diagnosis not present

## 2019-09-24 DIAGNOSIS — K222 Esophageal obstruction: Secondary | ICD-10-CM | POA: Diagnosis not present

## 2019-09-24 DIAGNOSIS — Z7982 Long term (current) use of aspirin: Secondary | ICD-10-CM | POA: Diagnosis not present

## 2019-09-24 DIAGNOSIS — D509 Iron deficiency anemia, unspecified: Secondary | ICD-10-CM | POA: Diagnosis not present

## 2019-09-24 DIAGNOSIS — Z923 Personal history of irradiation: Secondary | ICD-10-CM | POA: Insufficient documentation

## 2019-09-24 DIAGNOSIS — Z860100 Personal history of colon polyps, unspecified: Secondary | ICD-10-CM

## 2019-09-24 DIAGNOSIS — Z9981 Dependence on supplemental oxygen: Secondary | ICD-10-CM | POA: Diagnosis not present

## 2019-09-24 DIAGNOSIS — Z85118 Personal history of other malignant neoplasm of bronchus and lung: Secondary | ICD-10-CM | POA: Diagnosis not present

## 2019-09-24 DIAGNOSIS — E785 Hyperlipidemia, unspecified: Secondary | ICD-10-CM | POA: Diagnosis not present

## 2019-09-24 DIAGNOSIS — K635 Polyp of colon: Secondary | ICD-10-CM

## 2019-09-24 DIAGNOSIS — I2510 Atherosclerotic heart disease of native coronary artery without angina pectoris: Secondary | ICD-10-CM | POA: Diagnosis not present

## 2019-09-24 DIAGNOSIS — M109 Gout, unspecified: Secondary | ICD-10-CM | POA: Insufficient documentation

## 2019-09-24 DIAGNOSIS — Z8711 Personal history of peptic ulcer disease: Secondary | ICD-10-CM | POA: Diagnosis not present

## 2019-09-24 DIAGNOSIS — K5520 Angiodysplasia of colon without hemorrhage: Secondary | ICD-10-CM | POA: Insufficient documentation

## 2019-09-24 DIAGNOSIS — Z9221 Personal history of antineoplastic chemotherapy: Secondary | ICD-10-CM | POA: Insufficient documentation

## 2019-09-24 DIAGNOSIS — Z79899 Other long term (current) drug therapy: Secondary | ICD-10-CM | POA: Diagnosis not present

## 2019-09-24 DIAGNOSIS — I10 Essential (primary) hypertension: Secondary | ICD-10-CM | POA: Insufficient documentation

## 2019-09-24 DIAGNOSIS — Z7902 Long term (current) use of antithrombotics/antiplatelets: Secondary | ICD-10-CM | POA: Insufficient documentation

## 2019-09-24 DIAGNOSIS — D123 Benign neoplasm of transverse colon: Secondary | ICD-10-CM

## 2019-09-24 DIAGNOSIS — J449 Chronic obstructive pulmonary disease, unspecified: Secondary | ICD-10-CM | POA: Diagnosis not present

## 2019-09-24 DIAGNOSIS — K5900 Constipation, unspecified: Secondary | ICD-10-CM | POA: Insufficient documentation

## 2019-09-24 HISTORY — PX: COLONOSCOPY WITH PROPOFOL: SHX5780

## 2019-09-24 HISTORY — PX: POLYPECTOMY: SHX5525

## 2019-09-24 HISTORY — PX: ESOPHAGOGASTRODUODENOSCOPY (EGD) WITH PROPOFOL: SHX5813

## 2019-09-24 SURGERY — COLONOSCOPY WITH PROPOFOL
Anesthesia: Monitor Anesthesia Care

## 2019-09-24 MED ORDER — PROPOFOL 500 MG/50ML IV EMUL
INTRAVENOUS | Status: DC | PRN
  Administered 2019-09-24: 100 ug/kg/min via INTRAVENOUS

## 2019-09-24 MED ORDER — PROPOFOL 500 MG/50ML IV EMUL
INTRAVENOUS | Status: AC
  Filled 2019-09-24: qty 50

## 2019-09-24 MED ORDER — PROPOFOL 10 MG/ML IV BOLUS
INTRAVENOUS | Status: DC | PRN
  Administered 2019-09-24: 10 mg via INTRAVENOUS

## 2019-09-24 MED ORDER — LIDOCAINE 2% (20 MG/ML) 5 ML SYRINGE
INTRAMUSCULAR | Status: DC | PRN
  Administered 2019-09-24: 50 mg via INTRAVENOUS

## 2019-09-24 MED ORDER — LACTATED RINGERS IV SOLN
INTRAVENOUS | Status: DC
  Administered 2019-09-24: 1000 mL via INTRAVENOUS

## 2019-09-24 MED ORDER — SODIUM CHLORIDE 0.9 % IV SOLN: INTRAVENOUS | Status: DC

## 2019-09-24 SURGICAL SUPPLY — 24 items

## 2019-09-24 NOTE — H&P (Signed)
ASSESSMENT / PLAN:   9.  78 yo male with multiple medical problems referred for evaluation of normocytic anemia, probably iron deficiency with ferritin of 34 and iron saturation of 14%.  On chronic plavix. Hemoglobin stable at 11.9 (on iron).  No overt GI bleeding.   Rule out recurrent PUD, colon neoplasm, AVMs, etc.  -Patient will be scheduled for EGD and colonoscopy off Plavix.Marland Kitchen He requires supplemental oxygen necessitating need for procedures to be done at the hospital. The risks and benefits of EGD and colonoscopy with possible polypectomy / biopsies were discussed and the patient agrees to proceed.  -We will hold iron prior to procedures -Continue daily Protonix.  He is likely on this for history of upper GI bleed.  Denies history of GERD  2. Remote hx of colon polyps.  Several years overdue for follow-up colonoscopy, we sent him a letter in 2013.   3.  CAD / hx of stent on chronic anticoagulation, on plavix --Hold Plavix for 5 days before procedure - will instruct when and how to resume after procedure. Patient understands that there is a low but real risk of cardiovascular event such as heart attack, stroke, or embolism /  thrombosis, or ischemia while off Plavix. The patient consents to proceed. Will communicate by phone or EMR with patient's prescribing provider to confirm that holding Plavix is reasonable in this case.   4.  Constipation, likely secondary to iron -Increase MiraLAX to twice daily   HPI:    Chief Complaint:   anemia  78 year old male with PMH significant for multiple medical problems including gout, cognitive impairment,  COPD requiring supplemental 02, Stage IIA (T1b,N1, M0) non-small lung cancer treated with chemoradiation, DM 2, CAD with history of stenting, hypertension, hyperlipidemia, and PUD .  He is on chronic Plavix.   He has been sent by PCP Cyndi Bender, PA [facility]) for evaluation of iron deficiency anemia.  PCP has been treating patient  with iron for iron deficiency anemia.  Labs inserted into most recent office visit 07/25/2019 reveal hemoglobin of 11.9, MCV 90, TIBC 335, iron saturation 14%, ferritin 34.  Notation was made that the anemia had improved.  His baseline hemoglobin over the last year has been in the mid 11 range except for a couple of instances where it fell in the mid 9 to mid 10 range.  I do not know exactly when he started on iron but his hemoglobin is back to baseline  Patient on remotely to Dr. Henrene Pastor (2007).  Has screening colonoscopy in 2007 with removal of a small polyp.  Pathology report not in epic.  We sent him a colonoscopy recall letter in 2013.  Patient has history of an upper GI bleed secondary to PUD at [facility] in 2017. Actual report not in Care Everywhere but subsequent notes describe large necrotic ulcerated mass in the duodenum extending towards the pylorus.  Unable to locate path report.  He was positive for H. pylori and received treatment.  Apparently had follow-up EGD a few weeks later, report not available in Care Everywhere.  Patient has not had any overt GI bleeding.  He takes a daily AV aspirin, no other NSAIDs.  He is on daily Protonix but denies a history of GERD.  No nausea, vomiting.  He has recently developed constipation, probably secondary to iron.  PCP put him on something at night to mix with water, possibly MiraLAX but it has not helped   Data Reviewed:  07/19/2019 Epic labs BUN 9, creatinine  0.86, albumin 3.3, liver test otherwise normal.  Normal stress test December 2019       Past Medical History:  Diagnosis Date  . Arthritis   . CAD, NATIVE VESSEL    coronary stent x1-tx. Plavix use.  Marland Kitchen COPD (chronic obstructive pulmonary disease) ([place])    due to 50+ years Smoking.  Marland Kitchen Dyspnea    wears oxygen PRN  . Gastrointestinal bleeding, upper 04/29/2016  . Gout, unspecified    foot  . HYPERLIPIDEMIA   . HYPERTENSION   . Lung mass   . NSCL ca dx'd 12/2015  . On  supplemental oxygen by nasal cannula    PRN  . PEPTIC ULCER DISEASE   . Pneumonia   . TOBACCO ABUSE   . Type 2 diabetes mellitus ([place])    denies on 01-06-16          Past Surgical History:  Procedure Laterality Date  . CARDIAC CATHETERIZATION     '06-stent placed, 6'13 - Dr. Lizbeth Bark follows  . CORONARY STENT PLACEMENT     s/p  . ENDOBRONCHIAL ULTRASOUND Bilateral 01/11/2016   Procedure: ENDOBRONCHIAL ULTRASOUND;  Surgeon: Collene Gobble, MD;  Location: [facility] ENDOSCOPY;  Service: Endoscopy;  Laterality: Bilateral;  . VIDEO BRONCHOSCOPY WITH ENDOBRONCHIAL NAVIGATION Right 03/07/2018   Procedure: VIDEO BRONCHOSCOPY WITH ENDOBRONCHIAL NAVIGATION;  Surgeon: Collene Gobble, MD;  Location: [facility] OR;  Service: Thoracic;  Laterality: Right;        Family History  Problem Relation Age of Onset  . Breast cancer Sister   . Cancer Brother        Uncertain type  . Arthritis Mother   . Hypertension Father   . Heart attack Father    Social History        Tobacco Use  . Smoking status: Former Smoker    Packs/day: 0.50    Years: 50.00    Pack years: 25.00    Types: Cigarettes    Quit date: 01/12/2018    Years since quitting: 1.5  . Smokeless tobacco: Never Used  . Tobacco comment: stopped smoking cigarettes 2/ 2019  Substance Use Topics  . Alcohol use: Not Currently    Alcohol/week: 0.0 standard drinks  . Drug use: No         Current Outpatient Medications  Medication Sig Dispense Refill  . acetaminophen (TYLENOL) 500 MG tablet Take 250 mg by mouth every 6 (six) hours as needed for moderate pain or headache.     . alfuzosin (UROXATRAL) 10 MG 24 hr tablet Take 10 mg by mouth daily.    Marland Kitchen allopurinol (ZYLOPRIM) 100 MG tablet TAKE 1 TABLET BY MOUTH EVERY DAY 90 tablet 0  . aspirin 81 MG tablet Take 1 tablet (81 mg total) by mouth at bedtime. Restart on 03/09/18 30 tablet   . atorvastatin (LIPITOR) 80 MG tablet TAKE 1 TABLET BY MOUTH EVERY DAY 30  tablet 10  . clopidogrel (PLAVIX) 75 MG tablet TAKE 1 TABLET BY MOUTH EVERY DAY 90 tablet 0  . docusate sodium (COLACE) 100 MG capsule Take 100 mg by mouth at bedtime.    . donepezil (ARICEPT) 10 MG tablet Take 1 tablet (10 mg total) by mouth at bedtime. 30 tablet 11  . eplerenone (INSPRA) 25 MG tablet TAKE 1 TABLET BY MOUTH EVERY DAY 30 tablet 5  . finasteride (PROSCAR) 5 MG tablet Take 5 mg by mouth daily.  11  . KLOR-CON M20 20 MEQ tablet TAKE 1 TABLET BY MOUTH DAILY 30 tablet  11  . losartan-hydrochlorothiazide (HYZAAR) 100-12.5 MG tablet TAKE 1 TABLET BY MOUTH EVERY DAY 30 tablet 8  . memantine (NAMENDA) 5 MG tablet Take 1 tablet (5 mg total) by mouth 2 (two) times daily. 60 tablet 11  . oxybutynin (DITROPAN) 5 MG tablet Take 5 mg by mouth at bedtime.  11  . pantoprazole (PROTONIX) 40 MG tablet TAKE 40 MG BY MOUTH EVERY DAY 90 tablet 3  . PROAIR HFA 108 (90 Base) MCG/ACT inhaler Inhale 2 puffs into the lungs every 6 (six) hours as needed for wheezing or shortness of breath.  0  . Tiotropium Bromide-Olodaterol (STIOLTO RESPIMAT) 2.5-2.5 MCG/ACT AERS Inhale 2 puffs into the lungs daily. 1 Inhaler 5   No current facility-administered medications for this visit.    No Known Allergies   Review of Systems: All systems reviewed and negative except where noted in HPI.   Creatinine clearance cannot be calculated (Patient's most recent lab result is older than the maximum 21 days allowed.)   Physical Exam:       Wt Readings from Last 3 Encounters:  08/05/19 219 lb 6.4 oz (99.5 kg)  07/22/19 217 lb 9.6 oz (98.7 kg)  06/25/19 219 lb 6.4 oz (99.5 kg)    BP 118/74   Pulse 100   Temp 98.4 F (36.9 C)   Ht 6\' 1"  (1.854 m)   Wt 217 lb 6 oz (98.6 kg)   BMI 28.68 kg/m  Constitutional:  Pleasant male in no acute distress. Psychiatric: Normal mood and affect. Behavior is normal. EENT: Pupils normal.  Conjunctivae are normal. No scleral icterus. Neck supple.   Cardiovascular: Normal rate, regular rhythm. No edema Pulmonary/chest: Effort normal and breath sounds normal. No wheezing, rales or rhonchi. Abdominal: Soft, nondistended, nontender. Bowel sounds active throughout. There are no masses palpable. No hepatomegaly. Neurological: Alert and oriented to person place and time. Skin: Skin is warm and dry. No rashes noted.  Tye Savoy, NP  08/15/2019, 9:06 AM  GI ATTENDING  Interval history data reviewed.  Patient personally seen and examined at bedside.  He presents today for colonoscopy and upper endoscopy to evaluate probable iron deficiency anemia.  Remote colonoscopy as described.  More recent upper endoscopy elsewhere with ulcer disease.  Patient denies any interval issues.  Again, the nature of the procedure, as well as the risks, benefits, and alternatives were carefully and thoroughly reviewed with the patient. Ample time for discussion and questions allowed. The patient understood, was satisfied, and agreed to proceed.  Docia Chuck. Geri [place]in[place]., M.D. [facility] Division of Gastroenterology

## 2019-09-24 NOTE — Op Note (Signed)
[facility] Patient Name: Ryan Contreras Procedure Date: 09/24/2019 MRN: [id number] Attending MD: Docia Chuck. Henrene Pastor , MD Date of Birth: February 27, 1941 CSN: [id number] Age: 78 Admit Type: Outpatient Procedure:                Upper GI endoscopy Indications:              Iron deficiency anemia Providers:                Docia Chuck. Henrene Pastor, MD, Cleda Daub, RN., Technician,                            Theressa Millard, CRNA, Marguerita Merles, Technician Referring MD:             Cyndi Bender PA-C Medicines:                Monitored Anesthesia Care Complications:            No immediate complications. Estimated Blood Loss:     Estimated blood loss: none. Procedure:                Pre-Anesthesia Assessment:                           - Prior to the procedure, a History and Physical                            was performed, and patient medications and                            allergies were reviewed. The patient's tolerance of                            previous anesthesia was also reviewed. The risks                            and benefits of the procedure and the sedation                            options and risks were discussed with the patient.                            All questions were answered, and informed consent                            was obtained. Prior Anticoagulants: The patient has                            taken Plavix (clopidogrel), last dose was 5 days                            prior to procedure. ASA Grade Assessment: III - A                            patient with severe systemic disease. After  reviewing the risks and benefits, the patient was                            deemed in satisfactory condition to undergo the                            procedure.                           After obtaining informed consent, the endoscope was                            passed under direct vision. Throughout the   procedure, the patient's blood pressure, pulse, and                            oxygen saturations were monitored continuously. The                            GIF-H190 ([id number]) Olympus gastroscope was                            introduced through the mouth, and advanced to the                            second part of duodenum. The upper GI endoscopy was                            accomplished without difficulty. The patient                            tolerated the procedure well. Scope In: Scope Out: Findings:      The esophagus revealed a large caliber ringlike distal esophageal       stricture at the gastroesophageal junction but was otherwise normal.      The stomach was normal except for a small sliding hiatal hernia.      The examined duodenum was normal.      The cardia and gastric fundus were normal on retroflexion. Impression:               1. Incidental esophageal ring                           2. Otherwise unremarkable EGD. Moderate Sedation:      none Recommendation:           - Patient has a contact number available for                            emergencies. The signs and symptoms of potential                            delayed complications were discussed with the                            patient. Return to normal activities tomorrow.  Written discharge instructions were provided to the                            patient.                           - Resume previous diet.                           - Continue present medications.                           - Resume Plavix (clopidogrel) at prior dose today.                           - Colonoscopy today. Please see report regarding                            findings and final recommendations Procedure Code(s):        --- Professional ---                           [phone], Esophagogastroduodenoscopy, flexible,                            transoral; diagnostic, including collection of                             specimen(s) by brushing or washing, when performed                            (separate procedure) Diagnosis Code(s):        --- Professional ---                           D50.9, Iron deficiency anemia, unspecified CPT copyright 2019 American Medical Association. All rights reserved. The codes documented in this report are preliminary and upon coder review may  be revised to meet current compliance requirements. Docia Chuck. Henrene Pastor, MD 09/24/2019 10:44:23 AM This report has been signed electronically. Number of Addenda: 0

## 2019-09-24 NOTE — Transfer of Care (Signed)
Immediate Anesthesia Transfer of Care Note  Patient: Ryan Contreras  Procedure(s) Performed: COLONOSCOPY WITH PROPOFOL (N/A ) ESOPHAGOGASTRODUODENOSCOPY (EGD) WITH PROPOFOL (N/A ) POLYPECTOMY  Patient Location: PACU  Anesthesia Type:MAC  Level of Consciousness: awake  Airway & Oxygen Therapy: Patient Spontanous Breathing and Patient connected to face mask oxygen  Post-op Assessment: Report given to RN, Post -op Vital signs reviewed and stable and Patient moving all extremities X 4  Post vital signs: Reviewed and stable  Last Vitals:  Vitals Value Taken Time  BP 146/97 09/24/19 1108  Temp    Pulse 97 09/24/19 1110  Resp 25 09/24/19 1110  SpO2 100 % 09/24/19 1110  Vitals shown include unvalidated device data.  Last Pain:  Vitals:   09/24/19 1007  TempSrc: Oral  PainSc: 0-No pain         Complications: No apparent anesthesia complications

## 2019-09-24 NOTE — Discharge Instructions (Signed)
YOU HAD AN ENDOSCOPIC PROCEDURE TODAY: Refer to the procedure report and other information in the discharge instructions given to you for any specific questions about what was found during the examination. If this information does not answer your questions, please call [facility] office at [phone] to clarify.  ° °YOU SHOULD EXPECT: Some feelings of bloating in the abdomen. Passage of more gas than usual. Walking can help get rid of the air that was put into your GI tract during the procedure and reduce the bloating. If you had a lower endoscopy (such as a colonoscopy or flexible sigmoidoscopy) you may notice spotting of blood in your stool or on the toilet paper. Some abdominal soreness may be present for a day or two, also. ° °DIET: Your first meal following the procedure should be a light meal and then it is ok to progress to your normal diet. A half-sandwich or bowl of soup is an example of a good first meal. Heavy or fried foods are harder to digest and may make you feel nauseous or bloated. Drink plenty of fluids but you should avoid alcoholic beverages for 24 hours. If you had a esophageal dilation, please see attached instructions for diet.   ° °ACTIVITY: Your care partner should take you home directly after the procedure. You should plan to take it easy, moving slowly for the rest of the day. You can resume normal activity the day after the procedure however YOU SHOULD NOT DRIVE, use power tools, machinery or perform tasks that involve climbing or major physical exertion for 24 hours (because of the sedation medicines used during the test).  ° °SYMPTOMS TO REPORT IMMEDIATELY: °A gastroenterologist can be reached at any hour. Please call [phone]  for any of the following symptoms:  °Following lower endoscopy (colonoscopy, flexible sigmoidoscopy) °Excessive amounts of blood in the stool  °Significant tenderness, worsening of abdominal pains  °Swelling of the abdomen that is new, acute  °Fever of 100° or  higher  °Following upper endoscopy (EGD, EUS, ERCP, esophageal dilation) °Vomiting of blood or coffee ground material  °New, significant abdominal pain  °New, significant chest pain or pain under the shoulder blades  °Painful or persistently difficult swallowing  °New shortness of breath  °Black, tarry-looking or red, bloody stools ° °FOLLOW UP:  °If any biopsies were taken you will be contacted by phone or by letter within the next 1-3 weeks. Call [phone]  if you have not heard about the biopsies in 3 weeks.  °Please also call with any specific questions about appointments or follow up tests. ° °

## 2019-09-24 NOTE — Anesthesia Preprocedure Evaluation (Signed)
Anesthesia Evaluation  Patient identified by MRN, date of birth, ID band Patient awake    Reviewed: Allergy & Precautions, H&P , NPO status , Patient's Chart, lab work & pertinent test results  Airway Mallampati: II   Neck ROM: full    Dental   Pulmonary shortness of breath, COPD, former smoker,    breath sounds clear to auscultation       Cardiovascular hypertension, + CAD   Rhythm:regular Rate:Normal     Neuro/Psych    GI/Hepatic PUD,   Endo/Other  diabetes, Type 2  Renal/GU      Musculoskeletal  (+) Arthritis ,   Abdominal   Peds  Hematology   Anesthesia Other Findings   Reproductive/Obstetrics                             Anesthesia Physical Anesthesia Plan  ASA: III  Anesthesia Plan: MAC   Post-op Pain Management:    Induction: Intravenous  PONV Risk Score and Plan: 1 and Propofol infusion and Treatment may vary due to age or medical condition  Airway Management Planned: Nasal Cannula  Additional Equipment:   Intra-op Plan:   Post-operative Plan:   Informed Consent: I have reviewed the patients History and Physical, chart, labs and discussed the procedure including the risks, benefits and alternatives for the proposed anesthesia with the patient or authorized representative who has indicated his/her understanding and acceptance.       Plan Discussed with: CRNA, Anesthesiologist and Surgeon  Anesthesia Plan Comments:         Anesthesia Quick Evaluation

## 2019-09-24 NOTE — Anesthesia Postprocedure Evaluation (Signed)
Anesthesia Post Note  Patient: Ryan Contreras  Procedure(s) Performed: COLONOSCOPY WITH PROPOFOL (N/A ) ESOPHAGOGASTRODUODENOSCOPY (EGD) WITH PROPOFOL (N/A ) POLYPECTOMY     Patient location during evaluation: Endoscopy Anesthesia Type: MAC Level of consciousness: awake and alert Pain management: pain level controlled Vital Signs Assessment: post-procedure vital signs reviewed and stable Respiratory status: spontaneous breathing, nonlabored ventilation, respiratory function stable and patient connected to nasal cannula oxygen Cardiovascular status: blood pressure returned to baseline and stable Postop Assessment: no apparent nausea or vomiting Anesthetic complications: no    Last Vitals:  Vitals:   09/24/19 1120 09/24/19 1130  BP: (!) 123/58 (!) 98/56  Pulse: 96 91  Resp: 19 14  Temp: (!) 36.2 C   SpO2: 100% 92%    Last Pain:  Vitals:   09/24/19 1130  TempSrc:   PainSc: 0-No pain                 Levy Wellman S

## 2019-09-24 NOTE — Anesthesia Procedure Notes (Signed)
Procedure Name: MAC Date/Time: 09/24/2019 10:30 AM Performed by: Niel Hummer, CRNA Pre-anesthesia Checklist: Patient identified, Emergency Drugs available, Suction available and Patient being monitored Patient Re-evaluated:Patient Re-evaluated prior to induction Oxygen Delivery Method: Simple face mask

## 2019-09-24 NOTE — Op Note (Signed)
[facility] Patient Name: Ryan Contreras Procedure Date: 09/24/2019 MRN: [id number] Attending MD: Docia Chuck. Henrene Pastor , MD Date of Birth: May 31, 1941 CSN: [id number] Age: 78 Admit Type: Outpatient Procedure:                Colonoscopy with cold snare polypectomy x 2 Indications:              Iron deficiency anemia; history of nonadvanced                            tubular adenoma 2007. No follow-up since Providers:                Docia Chuck. Henrene Pastor, MD, Cleda Daub, RN, Elspeth Cho Tech., Technician, Theressa Millard, CRNA Referring MD:             Cyndi Bender, PA-C Medicines:                Monitored Anesthesia Care Complications:            No immediate complications. Estimated blood loss:                            None. Estimated Blood Loss:     Estimated blood loss: none. Procedure:                Pre-Anesthesia Assessment:                           - Prior to the procedure, a History and Physical                            was performed, and patient medications and                            allergies were reviewed. The patient's tolerance of                            previous anesthesia was also reviewed. The risks                            and benefits of the procedure and the sedation                            options and risks were discussed with the patient.                            All questions were answered, and informed consent                            was obtained. Prior Anticoagulants: The patient has                            taken Plavix (clopidogrel), last dose was 5 days  prior to procedure. ASA Grade Assessment: III - A                            patient with severe systemic disease. After                            reviewing the risks and benefits, the patient was                            deemed in satisfactory condition to undergo the                            procedure.        After obtaining informed consent, the colonoscope                            was passed under direct vision. Throughout the                            procedure, the patient's blood pressure, pulse, and                            oxygen saturations were monitored continuously. The                            CF-[id number] ([id number]) Olympus colonoscope was                            introduced through the anus and advanced to the the                            cecum, identified by appendiceal orifice and                            ileocecal valve. The ileocecal valve, appendiceal                            orifice, and rectum were photographed. The quality                            of the bowel preparation was excellent. The                            colonoscopy was performed without difficulty. The                            patient tolerated the procedure well. The bowel                            preparation used was SUPREP via split dose                            instruction. Scope In: 10:47:22 AM Scope Out: 11:02:41 AM Scope Withdrawal Time: 0 hours 10 minutes 2  seconds  Total Procedure Duration: 0 hours 15 minutes 19 seconds  Findings:      Three small angiodysplastic lesions without bleeding were found in the       cecum.      Two polyps were found in the transverse colon. The polyps were 2 to 4 mm       in size. These polyps were removed with a cold snare. Resection and       retrieval were complete.      The exam was otherwise without abnormality on direct and retroflexion       views. Impression:               - Three non-bleeding colonic angiodysplastic                            lesions. These may result in chronic GI blood loss                            and subsequent iron deficiency anemia (particularly                            in a patient on chronic Plavix therapy).                           - Two 2 to 4 mm polyps in the transverse colon,                             removed with a cold snare. Resected and retrieved.                           - The examination was otherwise normal on direct                            and retroflexion views. Moderate Sedation:      none Recommendation:           - Repeat colonoscopy is not recommended for                            surveillance.                           - Resume Plavix (clopidogrel) today at prior dose.                           - Patient has a contact number available for                            emergencies. The signs and symptoms of potential                            delayed complications were discussed with the                            patient. Return to normal activities tomorrow.  Written discharge instructions were provided to the                            patient.                           - Resume previous diet.                           - Continue present medications. Recommend IRON                            THERAPY DAILY INDEFINITELY                           - Await pathology results.                           - Return to the care of your primary provider Procedure Code(s):        --- Professional ---                           [phone], Colonoscopy, flexible; with removal of                            tumor(s), polyp(s), or other lesion(s) by snare                            technique Diagnosis Code(s):        --- Professional ---                           K55.20, Angiodysplasia of colon without hemorrhage                           K63.5, Polyp of colon                           D50.9, Iron deficiency anemia, unspecified CPT copyright 2019 [employer] Association. All rights reserved. The codes documented in this report are preliminary and upon coder review may  be revised to meet current compliance requirements. Docia Chuck. Henrene Pastor, MD 09/24/2019 11:15:45 AM This report has been signed electronically. Number of Addenda: 0

## 2019-09-25 LAB — SURGICAL PATHOLOGY

## 2019-09-25 NOTE — Telephone Encounter (Signed)
LMTCB x2 for pt 

## 2019-09-26 ENCOUNTER — Other Ambulatory Visit: Payer: Self-pay | Admitting: Endocrinology

## 2019-09-26 ENCOUNTER — Encounter (HOSPITAL_COMMUNITY): Payer: Self-pay | Admitting: Internal Medicine

## 2019-09-26 NOTE — Telephone Encounter (Signed)
Please forward refill request to pt's primary care provider.   

## 2019-09-26 NOTE — Telephone Encounter (Signed)
Please advise 

## 2019-10-02 ENCOUNTER — Telehealth: Payer: Self-pay | Admitting: Internal Medicine

## 2019-10-02 NOTE — Telephone Encounter (Signed)
Spoke with pt and let him know he can try miralax otc to have BM, titrate 1-3 doses. Pt verbalized understanding.

## 2019-10-24 ENCOUNTER — Other Ambulatory Visit: Payer: Self-pay | Admitting: Endocrinology

## 2019-10-24 NOTE — Telephone Encounter (Signed)
Please forward refill request to pt's primary care provider.   

## 2019-10-25 ENCOUNTER — Other Ambulatory Visit: Payer: Self-pay | Admitting: Endocrinology

## 2019-10-25 ENCOUNTER — Telehealth: Payer: Self-pay | Admitting: Internal Medicine

## 2019-10-25 NOTE — Telephone Encounter (Signed)
Spoke with pt and he is aware of path result. Benign adenoma, no follow-up procedure recommended.

## 2019-10-31 ENCOUNTER — Other Ambulatory Visit: Payer: Self-pay | Admitting: Internal Medicine

## 2019-12-18 DIAGNOSIS — J441 Chronic obstructive pulmonary disease with (acute) exacerbation: Secondary | ICD-10-CM | POA: Diagnosis not present

## 2019-12-18 DIAGNOSIS — Z20828 Contact with and (suspected) exposure to other viral communicable diseases: Secondary | ICD-10-CM | POA: Diagnosis not present

## 2019-12-18 DIAGNOSIS — Z20822 Contact with and (suspected) exposure to covid-19: Secondary | ICD-10-CM | POA: Diagnosis not present

## 2020-01-02 DIAGNOSIS — J449 Chronic obstructive pulmonary disease, unspecified: Secondary | ICD-10-CM | POA: Diagnosis not present

## 2020-01-02 DIAGNOSIS — J9621 Acute and chronic respiratory failure with hypoxia: Secondary | ICD-10-CM | POA: Diagnosis not present

## 2020-01-15 DIAGNOSIS — H401132 Primary open-angle glaucoma, bilateral, moderate stage: Secondary | ICD-10-CM | POA: Diagnosis not present

## 2020-01-20 ENCOUNTER — Encounter (HOSPITAL_COMMUNITY): Payer: Self-pay

## 2020-01-20 ENCOUNTER — Other Ambulatory Visit: Payer: Self-pay

## 2020-01-20 ENCOUNTER — Inpatient Hospital Stay: Payer: Medicare PPO | Attending: Internal Medicine

## 2020-01-20 ENCOUNTER — Ambulatory Visit (HOSPITAL_COMMUNITY)
Admission: RE | Admit: 2020-01-20 | Discharge: 2020-01-20 | Disposition: A | Discharge status: Home / Self Care | Payer: Medicare PPO | Source: Ambulatory Visit | Attending: Internal Medicine | Admitting: Internal Medicine

## 2020-01-20 DIAGNOSIS — F17210 Nicotine dependence, cigarettes, uncomplicated: Secondary | ICD-10-CM | POA: Insufficient documentation

## 2020-01-20 DIAGNOSIS — Z79899 Other long term (current) drug therapy: Secondary | ICD-10-CM | POA: Insufficient documentation

## 2020-01-20 DIAGNOSIS — Z9981 Dependence on supplemental oxygen: Secondary | ICD-10-CM | POA: Insufficient documentation

## 2020-01-20 DIAGNOSIS — Z955 Presence of coronary angioplasty implant and graft: Secondary | ICD-10-CM | POA: Diagnosis not present

## 2020-01-20 DIAGNOSIS — J449 Chronic obstructive pulmonary disease, unspecified: Secondary | ICD-10-CM | POA: Diagnosis not present

## 2020-01-20 DIAGNOSIS — Z923 Personal history of irradiation: Secondary | ICD-10-CM | POA: Insufficient documentation

## 2020-01-20 DIAGNOSIS — E785 Hyperlipidemia, unspecified: Secondary | ICD-10-CM | POA: Insufficient documentation

## 2020-01-20 DIAGNOSIS — Z6830 Body mass index (BMI) 30.0-30.9, adult: Secondary | ICD-10-CM | POA: Diagnosis not present

## 2020-01-20 DIAGNOSIS — Z7902 Long term (current) use of antithrombotics/antiplatelets: Secondary | ICD-10-CM | POA: Insufficient documentation

## 2020-01-20 DIAGNOSIS — I2510 Atherosclerotic heart disease of native coronary artery without angina pectoris: Secondary | ICD-10-CM | POA: Diagnosis not present

## 2020-01-20 DIAGNOSIS — M109 Gout, unspecified: Secondary | ICD-10-CM | POA: Insufficient documentation

## 2020-01-20 DIAGNOSIS — Z8711 Personal history of peptic ulcer disease: Secondary | ICD-10-CM | POA: Insufficient documentation

## 2020-01-20 DIAGNOSIS — Z9221 Personal history of antineoplastic chemotherapy: Secondary | ICD-10-CM | POA: Diagnosis not present

## 2020-01-20 DIAGNOSIS — C3490 Malignant neoplasm of unspecified part of unspecified bronchus or lung: Secondary | ICD-10-CM | POA: Diagnosis not present

## 2020-01-20 DIAGNOSIS — I10 Essential (primary) hypertension: Secondary | ICD-10-CM | POA: Insufficient documentation

## 2020-01-20 DIAGNOSIS — D509 Iron deficiency anemia, unspecified: Secondary | ICD-10-CM | POA: Diagnosis not present

## 2020-01-20 DIAGNOSIS — M1990 Unspecified osteoarthritis, unspecified site: Secondary | ICD-10-CM | POA: Diagnosis not present

## 2020-01-20 DIAGNOSIS — C3411 Malignant neoplasm of upper lobe, right bronchus or lung: Secondary | ICD-10-CM | POA: Insufficient documentation

## 2020-01-20 DIAGNOSIS — E119 Type 2 diabetes mellitus without complications: Secondary | ICD-10-CM | POA: Insufficient documentation

## 2020-01-20 DIAGNOSIS — R05 Cough: Secondary | ICD-10-CM | POA: Diagnosis not present

## 2020-01-20 DIAGNOSIS — Z7982 Long term (current) use of aspirin: Secondary | ICD-10-CM | POA: Insufficient documentation

## 2020-01-20 DIAGNOSIS — Z5111 Encounter for antineoplastic chemotherapy: Secondary | ICD-10-CM | POA: Diagnosis not present

## 2020-01-20 DIAGNOSIS — K5900 Constipation, unspecified: Secondary | ICD-10-CM | POA: Diagnosis not present

## 2020-01-20 DIAGNOSIS — J9610 Chronic respiratory failure, unspecified whether with hypoxia or hypercapnia: Secondary | ICD-10-CM | POA: Diagnosis not present

## 2020-01-20 DIAGNOSIS — K4090 Unilateral inguinal hernia, without obstruction or gangrene, not specified as recurrent: Secondary | ICD-10-CM | POA: Diagnosis not present

## 2020-01-20 LAB — CBC WITH DIFFERENTIAL (CANCER CENTER ONLY)
Abs Immature Granulocytes: 0.09 10*3/uL — ABNORMAL HIGH (ref 0.00–0.07)
Basophils Absolute: 0.1 10*3/uL (ref 0.0–0.1)
Basophils Relative: 1 %
Eosinophils Absolute: 0.2 10*3/uL (ref 0.0–0.5)
Eosinophils Relative: 2 %
HCT: 42.9 % (ref 39.0–52.0)
Hemoglobin: 13.1 g/dL (ref 13.0–17.0)
Immature Granulocytes: 1 %
Lymphocytes Relative: 26 %
Lymphs Abs: 2.5 10*3/uL (ref 0.7–4.0)
MCH: 29.8 pg (ref 26.0–34.0)
MCHC: 30.5 g/dL (ref 30.0–36.0)
MCV: 97.5 fL (ref 80.0–100.0)
Monocytes Absolute: 1.6 10*3/uL — ABNORMAL HIGH (ref 0.1–1.0)
Monocytes Relative: 17 %
Neutro Abs: 5 10*3/uL (ref 1.7–7.7)
Neutrophils Relative %: 53 %
Platelet Count: 340 10*3/uL (ref 150–400)
RBC: 4.4 MIL/uL (ref 4.22–5.81)
RDW: 16.5 % — ABNORMAL HIGH (ref 11.5–15.5)
WBC Count: 9.5 10*3/uL (ref 4.0–10.5)
nRBC: 0.2 % (ref 0.0–0.2)

## 2020-01-20 LAB — CMP (CANCER CENTER ONLY)
ALT: 20 U/L (ref 0–44)
AST: 24 U/L (ref 15–41)
Albumin: 3.4 g/dL — ABNORMAL LOW (ref 3.5–5.0)
Alkaline Phosphatase: 121 U/L (ref 38–126)
Anion gap: 9 (ref 5–15)
BUN: 10 mg/dL (ref 8–23)
CO2: 35 mmol/L — ABNORMAL HIGH (ref 22–32)
Calcium: 9 mg/dL (ref 8.9–10.3)
Chloride: 98 mmol/L (ref 98–111)
Creatinine: 0.9 mg/dL (ref 0.61–1.24)
GFR, Est AFR Am: >60 mL/min (ref >60)
GFR, Estimated: >60 mL/min (ref >60)
Glucose, Bld: 131 mg/dL — ABNORMAL HIGH (ref 70–99)
Potassium: 4.3 mmol/L (ref 3.5–5.1)
Sodium: 142 mmol/L (ref 135–145)
Total Bilirubin: 0.4 mg/dL (ref 0.3–1.2)
Total Protein: 8.2 g/dL — ABNORMAL HIGH (ref 6.5–8.1)

## 2020-01-20 MED ORDER — IOHEXOL 300 MG/ML  SOLN
75.0000 mL | Freq: Once | INTRAMUSCULAR | Status: AC | PRN
  Administered 2020-01-20: 75 mL via INTRAVENOUS

## 2020-01-20 MED ORDER — SODIUM CHLORIDE (PF) 0.9 % IJ SOLN
INTRAMUSCULAR | Status: AC
  Filled 2020-01-20: qty 50

## 2020-01-22 ENCOUNTER — Other Ambulatory Visit: Payer: Self-pay

## 2020-01-22 ENCOUNTER — Encounter: Payer: Self-pay | Admitting: Internal Medicine

## 2020-01-22 ENCOUNTER — Inpatient Hospital Stay (HOSPITAL_BASED_OUTPATIENT_CLINIC_OR_DEPARTMENT_OTHER): Payer: Medicare PPO | Admitting: Internal Medicine

## 2020-01-22 ENCOUNTER — Telehealth: Payer: Self-pay | Admitting: Medical Oncology

## 2020-01-22 VITALS — BP 138/83 | HR 87 | Temp 98.5°F | Resp 18 | Ht 73.0 in | Wt 229.9 lb

## 2020-01-22 DIAGNOSIS — Z923 Personal history of irradiation: Secondary | ICD-10-CM | POA: Diagnosis not present

## 2020-01-22 DIAGNOSIS — E785 Hyperlipidemia, unspecified: Secondary | ICD-10-CM | POA: Diagnosis not present

## 2020-01-22 DIAGNOSIS — J449 Chronic obstructive pulmonary disease, unspecified: Secondary | ICD-10-CM | POA: Diagnosis not present

## 2020-01-22 DIAGNOSIS — F17210 Nicotine dependence, cigarettes, uncomplicated: Secondary | ICD-10-CM | POA: Diagnosis not present

## 2020-01-22 DIAGNOSIS — R05 Cough: Secondary | ICD-10-CM | POA: Diagnosis not present

## 2020-01-22 DIAGNOSIS — I2510 Atherosclerotic heart disease of native coronary artery without angina pectoris: Secondary | ICD-10-CM | POA: Diagnosis not present

## 2020-01-22 DIAGNOSIS — C3490 Malignant neoplasm of unspecified part of unspecified bronchus or lung: Secondary | ICD-10-CM | POA: Diagnosis not present

## 2020-01-22 DIAGNOSIS — I10 Essential (primary) hypertension: Secondary | ICD-10-CM | POA: Diagnosis not present

## 2020-01-22 DIAGNOSIS — C3411 Malignant neoplasm of upper lobe, right bronchus or lung: Secondary | ICD-10-CM | POA: Diagnosis not present

## 2020-01-22 DIAGNOSIS — Z9221 Personal history of antineoplastic chemotherapy: Secondary | ICD-10-CM | POA: Diagnosis not present

## 2020-01-22 NOTE — Telephone Encounter (Signed)
Denyse Amass updated on plan of care for Jovante.

## 2020-01-22 NOTE — Progress Notes (Signed)
[facility] [phone]   Fax:([phone]  OFFICE PROGRESS NOTE  Cyndi Bender, PA-C [address] [place] [address]  DIAGNOSIS: Recurrent non-small cell lung cancer, squamous cell carcinoma initially presented as unresectable Stage IIA (T1b, N1, M0) non-small cell lung cancer, squamous cell carcinoma presented with right upper lobe lung nodule and hilar lymphadenopathy diagnosed in January 2017.  PRIOR THERAPY:  1) Course of concurrent chemoradiation with weekly carboplatin for AUC of 2 and paclitaxel 45 MG/M2. Status post 7 cycles, last dose was given 03/14/2016 with partial response. 2) status post stereotactic radiotherapy to recurrent lung cancer in the right upper lobe under the care of Dr. Lisbeth Renshaw completed on 06/08/2018  CURRENT THERAPY: Observation.  INTERVAL HISTORY: Ryan Contreras 79 y.o. male returns to the clinic today for follow-up visit.  The patient is feeling fine today with no concerning complaints except for the baseline shortness of breath secondary to COPD and he is currently on home oxygen at nighttime.  He was recently treated with a tapered dose of prednisone for his COPD.  He denied having any current chest pain but has occasional cough with blood-tinged sputum.  He denied having any nausea, vomiting, diarrhea or constipation.  He has no headache or visual changes.  He has no recent weight loss or night sweats.  The patient had repeat CT scan of the chest performed recently and is here for evaluation and discussion of his scan results.   MEDICAL HISTORY: Past Medical History:  Diagnosis Date  . Arthritis   . CAD, NATIVE VESSEL    coronary stent x1-tx. Plavix use.  Marland Kitchen COPD (chronic obstructive pulmonary disease) ([place])    due to 50+ years Smoking.  Marland Kitchen Dyspnea    wears oxygen PRN  . Gastrointestinal bleeding, upper 04/29/2016  . Gout, unspecified    foot  . HYPERLIPIDEMIA   . HYPERTENSION   . Lung mass   . NSCL ca dx'd  12/2015  . On supplemental oxygen by nasal cannula    PRN  . PEPTIC ULCER DISEASE   . Pneumonia   . TOBACCO ABUSE   . Type 2 diabetes mellitus ([place])    denies on 01-06-16    ALLERGIES:  has No Known Allergies.  MEDICATIONS:  Current Outpatient Medications  Medication Sig Dispense Refill  . acetaminophen (TYLENOL) 500 MG tablet Take 250 mg by mouth every 6 (six) hours as needed for moderate pain or headache.     . allopurinol (ZYLOPRIM) 100 MG tablet TAKE 1 TABLET BY MOUTH EVERY DAY (Patient taking differently: Take 100 mg by mouth daily. ) 90 tablet 0  . aspirin 81 MG tablet Take 1 tablet (81 mg total) by mouth at bedtime. Restart on 03/09/18 30 tablet   . atorvastatin (LIPITOR) 80 MG tablet TAKE 1 TABLET BY MOUTH EVERY DAY 30 tablet 8  . clopidogrel (PLAVIX) 75 MG tablet TAKE 1 TABLET BY MOUTH EVERY DAY (Patient taking differently: Take 75 mg by mouth daily. ) 90 tablet 2  . docusate sodium (COLACE) 100 MG capsule Take 100 mg by mouth 2 (two) times daily.     Marland Kitchen donepezil (ARICEPT) 10 MG tablet Take 1 tablet (10 mg total) by mouth at bedtime. (Patient not taking: Reported on 09/23/2019) 30 tablet 11  . eplerenone (INSPRA) 25 MG tablet TAKE 1 TABLET BY MOUTH EVERY DAY (Patient taking differently: Take 25 mg by mouth daily. ) 30 tablet 5  . KLOR-CON M20 20 MEQ tablet  TAKE 1 TABLET BY MOUTH DAILY (Patient taking differently: Take 20 mEq by mouth daily. ) 30 tablet 11  . metoprolol succinate (TOPROL-XL) 25 MG 24 hr tablet Take 25 mg by mouth daily.     . Na Sulfate-K Sulfate-Mg Sulf 17.5-3.13-1.6 GM/177ML SOLN Suprep-Use as directed 354 mL 0  . OXYGEN Inhale 2 L into the lungs at bedtime.    . pantoprazole (PROTONIX) 40 MG tablet TAKE 40 MG BY MOUTH EVERY DAY (Patient taking differently: Take 40 mg by mouth daily. ) 90 tablet 3  . PROAIR HFA 108 (90 Base) MCG/ACT inhaler Inhale 2 puffs into the lungs every 6 (six) hours as needed for wheezing or shortness of breath.  0  . sildenafil (REVATIO)  20 MG tablet Take 100 mg by mouth daily as needed for erectile dysfunction.    . TRELEGY ELLIPTA 100-62.5-25 MCG/INH AEPB Inhale 1 puff into the lungs daily. 60 each 11   No current facility-administered medications for this visit.    SURGICAL HISTORY:  Past Surgical History:  Procedure Laterality Date  . CARDIAC CATHETERIZATION     '06-stent placed, 6'13 - Dr. Lizbeth Bark follows  . COLONOSCOPY WITH PROPOFOL N/A 09/24/2019   Procedure: COLONOSCOPY WITH PROPOFOL;  Surgeon: Irene Shipper, MD;  Location: [facility];  Service: Endoscopy;  Laterality: N/A;  . CORONARY STENT PLACEMENT     s/p  . ENDOBRONCHIAL ULTRASOUND Bilateral 01/11/2016   Procedure: ENDOBRONCHIAL ULTRASOUND;  Surgeon: Collene Gobble, MD;  Location: [facility];  Service: Endoscopy;  Laterality: Bilateral;  . ESOPHAGOGASTRODUODENOSCOPY (EGD) WITH PROPOFOL N/A 09/24/2019   Procedure: ESOPHAGOGASTRODUODENOSCOPY (EGD) WITH PROPOFOL;  Surgeon: Irene Shipper, MD;  Location: [facility];  Service: Endoscopy;  Laterality: N/A;  . POLYPECTOMY  09/24/2019   Procedure: POLYPECTOMY;  Surgeon: Irene Shipper, MD;  Location: [facility];  Service: Endoscopy;;  . VIDEO BRONCHOSCOPY WITH ENDOBRONCHIAL NAVIGATION Right 03/07/2018   Procedure: VIDEO BRONCHOSCOPY WITH ENDOBRONCHIAL NAVIGATION;  Surgeon: Collene Gobble, MD;  Location: [facility] OR;  Service: Thoracic;  Laterality: Right;    REVIEW OF SYSTEMS:  A comprehensive review of systems was negative except for: Respiratory: positive for cough and dyspnea on exertion   PHYSICAL EXAMINATION: General appearance: alert, cooperative, fatigued and no distress Head: Normocephalic, without obvious abnormality, atraumatic Neck: no adenopathy, no JVD, supple, symmetrical, trachea midline and thyroid not enlarged, symmetric, no tenderness/mass/nodules Lymph nodes: Cervical, supraclavicular, and axillary nodes normal. Resp: clear to auscultation bilaterally Back: symmetric, no curvature. ROM normal.  No CVA tenderness. Cardio: regular rate and rhythm, S1, S2 normal, no murmur, click, rub or gallop GI: soft, non-tender; bowel sounds normal; no masses,  no organomegaly Extremities: extremities normal, atraumatic, no cyanosis or edema  ECOG PERFORMANCE STATUS: 1 - Symptomatic but completely ambulatory  Blood pressure 138/83, pulse 87, temperature 98.5 F (36.9 C), temperature source Temporal, resp. rate 18, height 6\' 1"  (1.854 m), weight 229 lb 14.4 oz (104.3 kg), SpO2 (!) 87 %.  LABORATORY DATA: Lab Results  Component Value Date   WBC 9.5 01/20/2020   HGB 13.1 01/20/2020   HCT 42.9 01/20/2020   MCV 97.5 01/20/2020   PLT 340 01/20/2020      Chemistry      Component Value Date/Time   NA 142 01/20/2020 0911   NA 141 06/16/2017 0906   K 4.3 01/20/2020 0911   K 3.4 (L) 06/16/2017 0906   CL 98 01/20/2020 0911   CO2 35 (H) 01/20/2020 0911   CO2 35 (H) 06/16/2017 7412  BUN 10 01/20/2020 0911   BUN 10.3 06/16/2017 0906   CREATININE 0.90 01/20/2020 0911   CREATININE 0.8 06/16/2017 0906      Component Value Date/Time   CALCIUM 9.0 01/20/2020 0911   CALCIUM 9.3 06/16/2017 0906   ALKPHOS 121 01/20/2020 0911   ALKPHOS 87 06/16/2017 0906   AST 24 01/20/2020 0911   AST 21 06/16/2017 0906   ALT 20 01/20/2020 0911   ALT 15 06/16/2017 0906   BILITOT 0.4 01/20/2020 0911   BILITOT 0.46 06/16/2017 0906       RADIOGRAPHIC STUDIES: CT Chest W Contrast  Result Date: 01/20/2020 CLINICAL DATA:  Non-small cell lung cancer staging, history of recurrent non-small cell lung cancer squamous cell carcinoma histology post concurrent chemo radiotherapy last dose of chemotherapy given on 03/14/2016 and radiotherapy completed in 2019. EXAM: CT CHEST WITH CONTRAST TECHNIQUE: Multidetector CT imaging of the chest was performed during intravenous contrast administration. CONTRAST:  27mL OMNIPAQUE IOHEXOL 300 MG/ML  SOLN COMPARISON:  07/19/2019 FINDINGS: Cardiovascular: Aorta with calcified and  noncalcified plaques similar to prior study. Caliber is normal. Heart size is stable. Distortion of right hilum secondary to post treatment changes in the right chest. Central pulmonary vasculature is otherwise unremarkable. No signs of pericardial fluid. Mediastinum/Nodes: Thoracic inlet structures are normal. Small right axillary lymph nodes are unchanged. No signs of discrete enlarged lymph node in the chest with persistent soft tissue about the right lower lobe bronchus and bronchus intermedius, soft tissue thickening at 8 mm, increased from 5-6 mm on the prior study. Lungs/Pleura: Increasing soft tissue mass with rounded borders in the right upper lobe 3 x 2.6 cm (image 49, series 5) as compared to 1.7 x 1.3 cm on the prior imaging assessment. Signs of septal thickening in the right upper lobe slightly increased from the previous study. This area of nodularity with focal rounded margins is situated along the minor fissure in the right chest, also visible on image 80 of series 6, coronal images. Pleural thickening, volume loss and hilar distortion is similar but with increased volume loss in the right middle lobe along with septal thickening since the prior study. Citric is a shin along the left infrahilar region is unchanged. Background pulmonary emphysema as before Similar to slightly increased airway narrowing to right lower lobe, associated with thickening along the course of the right bronchus intermedius and lower lobe bronchus. Upper Abdomen: Lobular hepatic contours. Low-density along the right hepatic margin unchanged measuring approximately 4.8 as compared to 5.0 cm. Imaged portions of spleen, pancreas and adrenal glands are normal. Musculoskeletal: Presumed large sebaceous cyst in the midline of the anterior chest. Asymmetric thickening of the right pectoralis musculature as compared to the left without discrete underlying lesion. No signs of discrete chest wall mass. Subtle rib destruction along the  anterior right chest involving right anterior fourth rib, associated with what appears to be a subacute fracture (image 67, series 2) no additional, suspicious areas of abnormality. IMPRESSION: 1. Enlarging soft tissue mass in the right upper lobe with rounded borders most concerning for disease recurrence. PET scan may be helpful to assess this area and area of volume loss following treatment. 2. Increasing septal thickening in the right upper lobe with increased volume loss in the right middle lobe post treatment changes and or lymphangitic tumor spread are both considered. 3. Increased soft tissue thickening along the course of the right lower lobe bronchus and bronchus intermedius this could be related to post treatment changes or spread disease recurrence.  Attention on follow-up or on subsequent PET scan as warranted stable presumed post treatment changes in the left infrahilar region, attention on follow-up. 4. Acute or subacute fracture of the right anterior fourth rib associated with some lytic bony changes, this could be related to prior therapy. Correlate with any new clinical symptoms. These results will be called to the ordering clinician or representative by the Radiologist Assistant, and communication documented in the PACS or zVision Dashboard. Aortic Atherosclerosis (ICD10-I70.0). Electronically Signed   By: Zetta Bills M.D.   On: 01/20/2020 12:51    ASSESSMENT AND PLAN:  This is a very pleasant 79 years old African-American male with recurrent non-small cell lung cancer, squamous cell carcinoma presented initially as unresectable a stage IIA non-small cell lung cancer, squamous cell carcinoma status post a course of concurrent chemoradiation.  The patient has been in observation since April 2017. The patient is doing fine today with no concerning complaints except for the baseline shortness of breath. He had repeat CT scan of the chest performed recently.  I personally and independently  reviewed the scans and discussed the results with the patient today. His scan showed enlarging soft tissue mass in the right upper lobe suspicious for disease recurrence. I recommended for the patient to have a PET scan performed for further evaluation of this lesion. For the COPD, he will continue with his current inhaler and treatment as prescribed by his primary care physician and pulmonologist.  He will also continue with the home oxygen. I will see him back for follow-up visit in 2 weeks. The patient was advised to call immediately if he has any concerning symptoms in the interval. The patient voices understanding of current disease status and treatment options and is in agreement with the current care plan. All questions were answered. The patient knows to call the clinic with any problems, questions or concerns. We can certainly see the patient much sooner if necessary.  Disclaimer: This note was dictated with voice recognition software. Similar sounding words can inadvertently be transcribed and may be missed upon review.

## 2020-01-24 ENCOUNTER — Telehealth: Payer: Self-pay | Admitting: Medical Oncology

## 2020-01-24 NOTE — Telephone Encounter (Signed)
Asking about PET scan appt. I told him it is under review.

## 2020-01-30 ENCOUNTER — Ambulatory Visit: Payer: Medicare PPO | Admitting: Emergency Medicine

## 2020-02-02 DIAGNOSIS — J449 Chronic obstructive pulmonary disease, unspecified: Secondary | ICD-10-CM | POA: Diagnosis not present

## 2020-02-02 DIAGNOSIS — J9621 Acute and chronic respiratory failure with hypoxia: Secondary | ICD-10-CM | POA: Diagnosis not present

## 2020-02-04 ENCOUNTER — Other Ambulatory Visit: Payer: Self-pay

## 2020-02-04 ENCOUNTER — Ambulatory Visit (HOSPITAL_COMMUNITY)
Admission: RE | Admit: 2020-02-04 | Discharge: 2020-02-04 | Disposition: A | Discharge status: Home / Self Care | Payer: Medicare PPO | Source: Ambulatory Visit | Attending: Internal Medicine | Admitting: Internal Medicine

## 2020-02-04 DIAGNOSIS — X58XXXA Exposure to other specified factors, initial encounter: Secondary | ICD-10-CM | POA: Diagnosis not present

## 2020-02-04 DIAGNOSIS — S2231XA Fracture of one rib, right side, initial encounter for closed fracture: Secondary | ICD-10-CM | POA: Diagnosis not present

## 2020-02-04 DIAGNOSIS — C3490 Malignant neoplasm of unspecified part of unspecified bronchus or lung: Secondary | ICD-10-CM | POA: Diagnosis not present

## 2020-02-04 DIAGNOSIS — R918 Other nonspecific abnormal finding of lung field: Secondary | ICD-10-CM | POA: Diagnosis not present

## 2020-02-04 DIAGNOSIS — R9389 Abnormal findings on diagnostic imaging of other specified body structures: Secondary | ICD-10-CM | POA: Diagnosis not present

## 2020-02-04 DIAGNOSIS — C3411 Malignant neoplasm of upper lobe, right bronchus or lung: Secondary | ICD-10-CM | POA: Diagnosis not present

## 2020-02-04 LAB — GLUCOSE, CAPILLARY: Glucose-Capillary: 92 mg/dL (ref 70–99)

## 2020-02-04 MED ORDER — FLUDEOXYGLUCOSE F - 18 (FDG) INJECTION
11.4300 | Freq: Once | INTRAVENOUS | Status: AC | PRN
  Administered 2020-02-04: 11.43 via INTRAVENOUS

## 2020-02-05 ENCOUNTER — Encounter: Payer: Self-pay | Admitting: *Deleted

## 2020-02-05 ENCOUNTER — Inpatient Hospital Stay (HOSPITAL_BASED_OUTPATIENT_CLINIC_OR_DEPARTMENT_OTHER): Payer: Medicare PPO | Admitting: Internal Medicine

## 2020-02-05 ENCOUNTER — Encounter: Payer: Self-pay | Admitting: Internal Medicine

## 2020-02-05 ENCOUNTER — Other Ambulatory Visit: Payer: Self-pay

## 2020-02-05 VITALS — BP 152/83 | HR 108 | Temp 98.2°F | Resp 18 | Ht 73.0 in | Wt 230.2 lb

## 2020-02-05 DIAGNOSIS — I2510 Atherosclerotic heart disease of native coronary artery without angina pectoris: Secondary | ICD-10-CM | POA: Diagnosis not present

## 2020-02-05 DIAGNOSIS — C3432 Malignant neoplasm of lower lobe, left bronchus or lung: Secondary | ICD-10-CM

## 2020-02-05 DIAGNOSIS — F17210 Nicotine dependence, cigarettes, uncomplicated: Secondary | ICD-10-CM

## 2020-02-05 DIAGNOSIS — R05 Cough: Secondary | ICD-10-CM | POA: Diagnosis not present

## 2020-02-05 DIAGNOSIS — Z9221 Personal history of antineoplastic chemotherapy: Secondary | ICD-10-CM | POA: Diagnosis not present

## 2020-02-05 DIAGNOSIS — E785 Hyperlipidemia, unspecified: Secondary | ICD-10-CM | POA: Diagnosis not present

## 2020-02-05 DIAGNOSIS — I10 Essential (primary) hypertension: Secondary | ICD-10-CM

## 2020-02-05 DIAGNOSIS — Z923 Personal history of irradiation: Secondary | ICD-10-CM | POA: Diagnosis not present

## 2020-02-05 DIAGNOSIS — J449 Chronic obstructive pulmonary disease, unspecified: Secondary | ICD-10-CM | POA: Diagnosis not present

## 2020-02-05 DIAGNOSIS — C3411 Malignant neoplasm of upper lobe, right bronchus or lung: Secondary | ICD-10-CM

## 2020-02-05 NOTE — Progress Notes (Signed)
Oncology Nurse Navigator Documentation  Oncology Nurse Navigator Flowsheets 02/05/2020  Abnormal Finding Date -  Confirmed Diagnosis Date -  Navigator Location [facility]  Referral Date to RadOnc/MedOnc -  Navigator Encounter Type Clinic/MDC/I spoke with patient today.per Dr. Worthy Flank request, I contacted Dr. Lamonte Sakai to see if he would be able to evaluated best way to obtain tissue and if possible to see patient sooner.    Telephone -  Treatment Initiated Date -  Patient Visit Type Follow-up  Treatment Phase Abnormal Scans  Barriers/Navigation Needs Coordination of Care;Education  Education Other  Interventions Coordination of Care;Education  Acuity Level 2-Minimal Needs (1-2 Barriers Identified)  Coordination of Care Other  Education Method Verbal  Time Spent with Patient 30

## 2020-02-05 NOTE — Progress Notes (Signed)
[place] [phone]   [phone]  OFFICE PROGRESS NOTE  Cyndi Bender, PA-C [address] [place] [address]  DIAGNOSIS: Recurrent non-small cell lung cancer, squamous cell carcinoma initially presented as unresectable Stage IIA (T1b, N1, M0) non-small cell lung cancer, squamous cell carcinoma presented with right upper lobe lung nodule and hilar lymphadenopathy diagnosed in January 2017.  PRIOR THERAPY:  1) Course of concurrent chemoradiation with weekly carboplatin for AUC of 2 and paclitaxel 45 MG/M2. Status post 7 cycles, last dose was given 03/14/2016 with partial response. 2) status post stereotactic radiotherapy to recurrent lung cancer in the right upper lobe under the care of Dr. Lisbeth Renshaw completed on 06/08/2018  CURRENT THERAPY: Observation.  INTERVAL HISTORY: [facility] 79 y.o. male returns to the clinic today for follow-up visit.  The patient is feeling fine except for the baseline shortness of breath and he is currently on home oxygen.  He denied having any chest pain but has cough with occasional episodes of hemoptysis.  He denied having any fever or chills.  He has no nausea, vomiting, diarrhea or constipation.  He denied having any headache or visual changes.  The patient denied having any recent weight loss or night sweats.  He was found on previous CT scan of the chest to have suspicious disease recurrence in the right lung.  I ordered a PET scan which was performed yesterday and the patient is here today for evaluation and discussion of his scan results and treatment options.    MEDICAL HISTORY: Past Medical History:  Diagnosis Date  . Arthritis   . CAD, NATIVE VESSEL    coronary stent x1-tx. Plavix use.  Marland Kitchen COPD (chronic obstructive pulmonary disease) ([place])    due to 50+ years Smoking.  Marland Kitchen Dyspnea    wears oxygen PRN  . Gastrointestinal bleeding, upper 04/29/2016  . Gout, unspecified    foot  . HYPERLIPIDEMIA   .  HYPERTENSION   . Lung mass   . NSCL ca dx'd 12/2015  . On supplemental oxygen by nasal cannula    PRN  . PEPTIC ULCER DISEASE   . Pneumonia   . TOBACCO ABUSE   . Type 2 diabetes mellitus ([place])    denies on 01-06-16    ALLERGIES:  has No Known Allergies.  MEDICATIONS:  Current Outpatient Medications  Medication Sig Dispense Refill  . acetaminophen (TYLENOL) 500 MG tablet Take 250 mg by mouth every 6 (six) hours as needed for moderate pain or headache.     . allopurinol (ZYLOPRIM) 100 MG tablet TAKE 1 TABLET BY MOUTH EVERY DAY (Patient taking differently: Take 100 mg by mouth daily. ) 90 tablet 0  . aspirin 81 MG tablet Take 1 tablet (81 mg total) by mouth at bedtime. Restart on 03/09/18 30 tablet   . atorvastatin (LIPITOR) 80 MG tablet TAKE 1 TABLET BY MOUTH EVERY DAY 30 tablet 8  . brimonidine (ALPHAGAN) 0.15 % ophthalmic solution     . clopidogrel (PLAVIX) 75 MG tablet TAKE 1 TABLET BY MOUTH EVERY DAY (Patient taking differently: Take 75 mg by mouth daily. ) 90 tablet 2  . docusate sodium (COLACE) 100 MG capsule Take 100 mg by mouth 2 (two) times daily.     Marland Kitchen donepezil (ARICEPT) 10 MG tablet Take 1 tablet (10 mg total) by mouth at bedtime. (Patient not taking: Reported on 09/23/2019) 30 tablet 11  . eplerenone (INSPRA) 25 MG tablet TAKE 1 TABLET BY MOUTH  EVERY DAY (Patient taking differently: Take 25 mg by mouth daily. ) 30 tablet 5  . KLOR-CON M20 20 MEQ tablet TAKE 1 TABLET BY MOUTH DAILY (Patient taking differently: Take 20 mEq by mouth daily. ) 30 tablet 11  . metoprolol succinate (TOPROL-XL) 25 MG 24 hr tablet Take 25 mg by mouth daily.     . Na Sulfate-K Sulfate-Mg Sulf 17.5-3.13-1.6 GM/177ML SOLN Suprep-Use as directed 354 mL 0  . OXYGEN Inhale 2 L into the lungs at bedtime.    . pantoprazole (PROTONIX) 40 MG tablet TAKE 40 MG BY MOUTH EVERY DAY (Patient taking differently: Take 40 mg by mouth daily. ) 90 tablet 3  . predniSONE (DELTASONE) 20 MG tablet     . PROAIR HFA 108 (90  Base) MCG/ACT inhaler Inhale 2 puffs into the lungs every 6 (six) hours as needed for wheezing or shortness of breath.  0  . sildenafil (REVATIO) 20 MG tablet Take 100 mg by mouth daily as needed for erectile dysfunction.    . TRELEGY ELLIPTA 100-62.5-25 MCG/INH AEPB Inhale 1 puff into the lungs daily. 60 each 11   No current facility-administered medications for this visit.    SURGICAL HISTORY:  Past Surgical History:  Procedure Laterality Date  . CARDIAC CATHETERIZATION     '06-stent placed, 6'13 - Dr. Lizbeth Bark follows  . COLONOSCOPY WITH PROPOFOL N/A 09/24/2019   Procedure: COLONOSCOPY WITH PROPOFOL;  Surgeon: Irene Shipper, MD;  Location: [facility];  Service: Endoscopy;  Laterality: N/A;  . CORONARY STENT PLACEMENT     s/p  . ENDOBRONCHIAL ULTRASOUND Bilateral 01/11/2016   Procedure: ENDOBRONCHIAL ULTRASOUND;  Surgeon: Collene Gobble, MD;  Location: [facility];  Service: Endoscopy;  Laterality: Bilateral;  . ESOPHAGOGASTRODUODENOSCOPY (EGD) WITH PROPOFOL N/A 09/24/2019   Procedure: ESOPHAGOGASTRODUODENOSCOPY (EGD) WITH PROPOFOL;  Surgeon: Irene Shipper, MD;  Location: [facility];  Service: Endoscopy;  Laterality: N/A;  . POLYPECTOMY  09/24/2019   Procedure: POLYPECTOMY;  Surgeon: Irene Shipper, MD;  Location: [facility];  Service: Endoscopy;;  . VIDEO BRONCHOSCOPY WITH ENDOBRONCHIAL NAVIGATION Right 03/07/2018   Procedure: VIDEO BRONCHOSCOPY WITH ENDOBRONCHIAL NAVIGATION;  Surgeon: Collene Gobble, MD;  Location: [facility];  Service: Thoracic;  Laterality: Right;    REVIEW OF SYSTEMS:  Constitutional: positive for fatigue Eyes: negative Ears, nose, mouth, throat, and face: negative Respiratory: positive for cough, dyspnea on exertion and hemoptysis Cardiovascular: negative Gastrointestinal: negative Genitourinary:negative Integument/breast: negative Hematologic/lymphatic: negative Musculoskeletal:negative Neurological: negative Behavioral/Psych: negative Endocrine:  negative Allergic/Immunologic: negative   PHYSICAL EXAMINATION: General appearance: alert, cooperative, fatigued and no distress Head: Normocephalic, without obvious abnormality, atraumatic Neck: no adenopathy, no JVD, supple, symmetrical, trachea midline and thyroid not enlarged, symmetric, no tenderness/mass/nodules Lymph nodes: Cervical, supraclavicular, and axillary nodes normal. Resp: clear to auscultation bilaterally Back: symmetric, no curvature. ROM normal. No CVA tenderness. Cardio: regular rate and rhythm, S1, S2 normal, no murmur, click, rub or gallop GI: soft, non-tender; bowel sounds normal; no masses,  no organomegaly Extremities: extremities normal, atraumatic, no cyanosis or edema Neurologic: Alert and oriented X 3, normal strength and tone. Normal symmetric reflexes. Normal coordination and gait  ECOG PERFORMANCE STATUS: 1 - Symptomatic but completely ambulatory  Blood pressure (!) 152/83, pulse (!) 108, temperature 98.2 F (36.8 C), temperature source Temporal, resp. rate 18, height 6\' 1"  (1.854 m), weight 230 lb 3.2 oz (104.4 kg), SpO2 95 %.  LABORATORY DATA: Lab Results  Component Value Date   WBC 9.5 01/20/2020   HGB 13.1 01/20/2020  HCT 42.9 01/20/2020   MCV 97.5 01/20/2020   PLT 340 01/20/2020      Chemistry      Component Value Date/Time   NA 142 01/20/2020 0911   NA 141 06/16/2017 0906   K 4.3 01/20/2020 0911   K 3.4 (L) 06/16/2017 0906   CL 98 01/20/2020 0911   CO2 35 (H) 01/20/2020 0911   CO2 35 (H) 06/16/2017 0906   BUN 10 01/20/2020 0911   BUN 10.3 06/16/2017 0906   CREATININE 0.90 01/20/2020 0911   CREATININE 0.8 06/16/2017 0906      Component Value Date/Time   CALCIUM 9.0 01/20/2020 0911   CALCIUM 9.3 06/16/2017 0906   ALKPHOS 121 01/20/2020 0911   ALKPHOS 87 06/16/2017 0906   AST 24 01/20/2020 0911   AST 21 06/16/2017 0906   ALT 20 01/20/2020 0911   ALT 15 06/16/2017 0906   BILITOT 0.4 01/20/2020 0911   BILITOT 0.46 06/16/2017  0906       RADIOGRAPHIC STUDIES: CT Chest W Contrast  Result Date: 01/20/2020 CLINICAL DATA:  Non-small cell lung cancer staging, history of recurrent non-small cell lung cancer squamous cell carcinoma histology post concurrent chemo radiotherapy last dose of chemotherapy given on 03/14/2016 and radiotherapy completed in 2019. EXAM: CT CHEST WITH CONTRAST TECHNIQUE: Multidetector CT imaging of the chest was performed during intravenous contrast administration. CONTRAST:  7mL OMNIPAQUE IOHEXOL 300 MG/ML  SOLN COMPARISON:  07/19/2019 FINDINGS: Cardiovascular: Aorta with calcified and noncalcified plaques similar to prior study. Caliber is normal. Heart size is stable. Distortion of right hilum secondary to post treatment changes in the right chest. Central pulmonary vasculature is otherwise unremarkable. No signs of pericardial fluid. Mediastinum/Nodes: Thoracic inlet structures are normal. Small right axillary lymph nodes are unchanged. No signs of discrete enlarged lymph node in the chest with persistent soft tissue about the right lower lobe bronchus and bronchus intermedius, soft tissue thickening at 8 mm, increased from 5-6 mm on the prior study. Lungs/Pleura: Increasing soft tissue mass with rounded borders in the right upper lobe 3 x 2.6 cm (image 49, series 5) as compared to 1.7 x 1.3 cm on the prior imaging assessment. Signs of septal thickening in the right upper lobe slightly increased from the previous study. This area of nodularity with focal rounded margins is situated along the minor fissure in the right chest, also visible on image 80 of series 6, coronal images. Pleural thickening, volume loss and hilar distortion is similar but with increased volume loss in the right middle lobe along with septal thickening since the prior study. Citric is a shin along the left infrahilar region is unchanged. Background pulmonary emphysema as before Similar to slightly increased airway narrowing to right  lower lobe, associated with thickening along the course of the right bronchus intermedius and lower lobe bronchus. Upper Abdomen: Lobular hepatic contours. Low-density along the right hepatic margin unchanged measuring approximately 4.8 as compared to 5.0 cm. Imaged portions of spleen, pancreas and adrenal glands are normal. Musculoskeletal: Presumed large sebaceous cyst in the midline of the anterior chest. Asymmetric thickening of the right pectoralis musculature as compared to the left without discrete underlying lesion. No signs of discrete chest wall mass. Subtle rib destruction along the anterior right chest involving right anterior fourth rib, associated with what appears to be a subacute fracture (image 67, series 2) no additional, suspicious areas of abnormality. IMPRESSION: 1. Enlarging soft tissue mass in the right upper lobe with rounded borders most concerning for disease recurrence.  PET scan may be helpful to assess this area and area of volume loss following treatment. 2. Increasing septal thickening in the right upper lobe with increased volume loss in the right middle lobe post treatment changes and or lymphangitic tumor spread are both considered. 3. Increased soft tissue thickening along the course of the right lower lobe bronchus and bronchus intermedius this could be related to post treatment changes or spread disease recurrence. Attention on follow-up or on subsequent PET scan as warranted stable presumed post treatment changes in the left infrahilar region, attention on follow-up. 4. Acute or subacute fracture of the right anterior fourth rib associated with some lytic bony changes, this could be related to prior therapy. Correlate with any new clinical symptoms. These results will be called to the ordering clinician or representative by the Radiologist Assistant, and communication documented in the PACS or zVision Dashboard. Aortic Atherosclerosis (ICD10-I70.0). Electronically Signed   By:  Zetta Bills M.D.   On: 01/20/2020 12:51    ASSESSMENT AND PLAN:  This is a very pleasant 79 years old African-American male with recurrent non-small cell lung cancer, squamous cell carcinoma presented initially as unresectable a stage IIA non-small cell lung cancer, squamous cell carcinoma status post a course of concurrent chemoradiation.  The patient has been in observation since April 2017. His scan showed enlarging soft tissue mass in the right upper lobe suspicious for disease recurrence. I ordered a PET scan which was performed yesterday but unfortunately the final report is not available at this point.  I personally and independently reviewed the scan images and discussed the results with the patient today.  His PET scan showed hypermetabolic activity in the right upper lobe mass in addition to mediastinal lymphadenopathy.  I would wait for the final report for any additional information. I will refer the patient to Dr. Lamonte Sakai for consideration of repeat bronchoscopy and tissue diagnosis for the suspicious recurrent disease in the right lung. I will arrange for the patient to come back for follow-up visit in 2 weeks for evaluation and discussion of his treatment options based on the final pathology from his biopsy. For the COPD, he is followed by Dr. Lamonte Sakai.  He will continue on his home oxygen for now. For hypertension, he is not currently on any blood pressure medication and I advised the patient to monitor it closely at home and report to his primary care physician for treatment if needed. The patient was advised to call immediately if he has any concerning symptoms in the interval. The patient voices understanding of current disease status and treatment options and is in agreement with the current care plan. All questions were answered. The patient knows to call the clinic with any problems, questions or concerns. We can certainly see the patient much sooner if necessary.  Disclaimer:  This note was dictated with voice recognition software. Similar sounding words can inadvertently be transcribed and may be missed upon review.

## 2020-02-05 NOTE — Patient Instructions (Signed)
PET scan - central scheduling will call you with an appointment for your PET scan.

## 2020-02-06 ENCOUNTER — Telehealth: Payer: Self-pay | Admitting: Internal Medicine

## 2020-02-06 NOTE — Telephone Encounter (Signed)
Scheduled per los. Called and spoke with patient. Confirmed appt 

## 2020-02-10 ENCOUNTER — Encounter: Payer: Self-pay | Admitting: Emergency Medicine

## 2020-02-10 ENCOUNTER — Telehealth: Payer: Self-pay | Admitting: Medical Oncology

## 2020-02-10 ENCOUNTER — Ambulatory Visit: Payer: Medicare PPO | Admitting: Emergency Medicine

## 2020-02-10 ENCOUNTER — Other Ambulatory Visit: Payer: Self-pay

## 2020-02-10 VITALS — BP 140/76 | Temp 97.3°F | Ht 73.0 in | Wt 230.8 lb

## 2020-02-10 DIAGNOSIS — R918 Other nonspecific abnormal finding of lung field: Secondary | ICD-10-CM | POA: Diagnosis not present

## 2020-02-10 DIAGNOSIS — C3411 Malignant neoplasm of upper lobe, right bronchus or lung: Secondary | ICD-10-CM

## 2020-02-10 DIAGNOSIS — J449 Chronic obstructive pulmonary disease, unspecified: Secondary | ICD-10-CM | POA: Diagnosis not present

## 2020-02-10 NOTE — Assessment & Plan Note (Signed)
We will arrange for bronchoscopy under general anesthesia at [facility] to better evaluate your enlarging pulmonary nodule.  The area is concerning for a recurrence of your lung cancer. In order to do the bronchoscopy you will need to stop your Plavix 5 days before the procedure.  Stop your aspirin 2 days before the procedure. Our office will contact you with the timing of the procedure once we have it scheduled.

## 2020-02-10 NOTE — Telephone Encounter (Signed)
F/U pt hemoptysis , He coughed a small amt of blood today , none yesterday. He is scheduled to see Dr Lamonte Sakai today and I encouraged him to tell Dr Lamonte Sakai about his hemotysis.

## 2020-02-10 NOTE — Assessment & Plan Note (Signed)
Please continue your Trelegy 1 inhalation once daily.  Rinse and gargle after you use this medication. Keep your albuterol available to use 2 puffs when you need it for shortness of breath, chest tightness, wheezing. Wear your oxygen more reliably, 2 L/min at all times. Follow with Dr Lamonte Sakai in 1 month

## 2020-02-10 NOTE — Telephone Encounter (Signed)
No.  I will see him after evaluation by Dr. Lamonte Sakai.

## 2020-02-10 NOTE — Patient Instructions (Signed)
Please continue your Trelegy 1 inhalation once daily.  Rinse and gargle after you use this medication. Keep your albuterol available to use 2 puffs when you need it for shortness of breath, chest tightness, wheezing. Wear your oxygen more reliably, 2 L/min at all times. We will arrange for bronchoscopy under general anesthesia at [facility] to better evaluate your enlarging pulmonary nodule.  The area is concerning for a recurrence of your lung cancer. In order to do the bronchoscopy you will need to stop your Plavix 5 days before the procedure.  Stop your aspirin 2 days before the procedure. Our office will contact you with the timing of the procedure once we have it scheduled. Follow with Dr Lamonte Sakai in 1 month

## 2020-02-10 NOTE — Progress Notes (Signed)
Subjective:    Patient ID: [facility] Contreras, male    DOB: 1941/08/10, 79 y.o.   MRN: [id number]  HPI  79 year old gentleman, former smoker, COPD, whom I have met before in 2017.  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  He underwent concurrent chemoradiation and was followed by Dr. Julien Nordmann.  He has been followed with serial imaging.  He had a CT scan on 12/18/17 that I reviewed that showed possible local recurrence in the central aspect of the right upper lobe with an enlarging nodule.  Also noted was enlarging mixed solid and sub-solid lesion in the superior segment of the left lower lobe.  PET scan was done on 01/08/18 that I reviewed and which confirmed hypermetabolism of the right upper lobe nodule, low metabolic activity (SUV max 2.7) in the left lower lobe mixed solid/groundglass lesion.  There was no evidence of mediastinal lymphadenopathy or hypermetabolism.  Also noted with some hypermetabolism around the prostate.  A PSA done on 12/27/17 was elevated at 13.9.               ROV 03/15/18 --patient returns today for continued evaluation of his COPD, recurrence of his non-small cell lung cancer, squamous cell.  He underwent navigational bronchoscopy since our last visit to evaluate an enlarging right perihilar nodule as well as a less well-formed left lower lobe nodular infiltrate.  The right upper lobe nodule was consistent with squamous cell lung cancer.  The left-sided biopsies were negative but the brushing did show malignant cells.  He saw Dr. Julien Nordmann on 03/13/18 to discuss options for therapy and has decided to pursue possible stereotactic XRT.  The other option would be chemotherapy in addition to [facility].  He restarted his Plavix without incident.  He reports that he has felt well, still gets exertional SOB when walking 50-75 ft. Has to stop when doing housework. He coughs daily, clear mucous. '' He is currently on  Symbicort, uses albuterol approximately about 2x a day.  He has o2 that he uses at night  ROV 07/23/18 --79 year old gentleman with a history of COPD and recurrent squamous cell lung cancer.  He underwent stereotactic radiation in June 2019, is being followed by Dr. Julien Nordmann and is currently on observation.  At his last visit we tried changing symbicort to stiolto. He continues to have exertional SOB, has to rest after short walk. He has daily cough, mostly non-prod, but daily produces some clear to yellow. He hears some wheeze. He uses his albuterol on a bid schedule. He may actually be doing his stiolto on a bid schedule as well. He has O2 at night, has tanks for exertion but doesn't like to use them  Last ct was reviewed by me from 07/17/18, shows anterior right upper lobe distortion and consolidation suggestive of radiation change.  The tumor itself is difficult to identify.  Left lower lobe lesion is stable in size  R OV 02/10/2020 --79 year old man with COPD and hypoxemic respiratory failure, recurrent squamous cell lung cancer being followed by Dr. Earlie Server and posterior tactic radiation therapy.  His most recent screening CT was done 01/20/2020 which showed an enlarging right upper lobe rounded opacity concerning for recurrence.  PET scan on 2/24 reviewed by me shows intense hypermetabolism of the enlarging lesion and and the parabronchial soft tissue adjacent to the bronchus intermedius.  He is here to discuss these results and plan next  steps. He reports that he has been having some hemoptysis since last week. His breathing is probably progressively worse. He did not bring his oxygen with him today - poor compliance. He is desaturated today walking into the office. He is on trelegy, albuterol - uses about 1-2x a day.   His sister [name] is his family contact.                    Review of Systems     Objective:   Physical Exam Vitals:   02/10/20 1155  BP: 140/76  Temp: (!) 97.3 F (36.3 C)    TempSrc: Temporal  SpO2: 92%  Weight: 230 lb 12.8 oz (104.7 kg)  Height: '6\' 1"'  (1.854 m)   Gen: Pleasant, well-nourished, in no distress,  normal affect  ENT: No lesions,  mouth clear,  oropharynx clear, no postnasal drip, poor dentition  Neck: No JVD, no stridor  Lungs: No use of accessory muscles, very distant, no wheezing or crackles.   Cardiovascular: RRR, heart sounds normal, no murmur or gallops, trace peripheral edema  Musculoskeletal: No deformities, no cyanosis or clubbing  Neuro: alert, non focal  Skin: Warm, no lesions or rash     Assessment & Plan:  COPD  GOLD C Please continue your Trelegy 1 inhalation once daily.  Rinse and gargle after you use this medication. Keep your albuterol available to use 2 puffs when you need it for shortness of breath, chest tightness, wheezing. Wear your oxygen more reliably, 2 L/min at all times. Follow with Dr Lamonte Sakai in 1 month  Malignant neoplasm of right upper lobe of lung [facility]) We will arrange for bronchoscopy under general anesthesia at [facility] to better evaluate your enlarging pulmonary nodule.  The area is concerning for a recurrence of your lung cancer. In order to do the bronchoscopy you will need to stop your Plavix 5 days before the procedure.  Stop your aspirin 2 days before the procedure. Our office will contact you with the timing of the procedure once we have it scheduled.   Baltazar Apo, MD, PhD 02/10/2020, 1:57 PM [facility] [phone] or if no answer [phone]

## 2020-02-12 ENCOUNTER — Telehealth: Payer: Self-pay | Admitting: *Deleted

## 2020-02-12 NOTE — Telephone Encounter (Signed)
Oncology Nurse Navigator Documentation  Oncology Nurse Navigator Flowsheets 02/12/2020  Abnormal Finding Date -  Confirmed Diagnosis Date -  Navigator Location [facility]  Referral Date to RadOnc/MedOnc -  Navigator Encounter Type Telephone/I called Mr. Scull to update him on his upcoming appts.  I was worried he would forget.  I updated him on all upcoming appts and he verbalized understanding of them all.  I asked that he reached out to me if he needed clarification on appts.   Telephone Outgoing Call  Treatment Initiated Date -  Patient Visit Type Other  Treatment Phase Abnormal Scans  Barriers/Navigation Needs Education  Education Other  Interventions Education  Acuity Level 2-Minimal Needs (1-2 Barriers Identified)  Coordination of Care -  Education Method Verbal  Time Spent with Patient 30

## 2020-02-17 ENCOUNTER — Telehealth: Payer: Self-pay | Admitting: Emergency Medicine

## 2020-02-17 NOTE — Telephone Encounter (Signed)
Patient called let him know the bronch is 02/24/20 and that his covid swab is 02/22/20.    He has appt with [facility] 02/18/20 will discuss more there  Nothing further needed at this time.

## 2020-02-18 ENCOUNTER — Encounter: Payer: Self-pay | Admitting: Emergency Medicine

## 2020-02-18 ENCOUNTER — Other Ambulatory Visit: Payer: Self-pay

## 2020-02-18 ENCOUNTER — Ambulatory Visit (INDEPENDENT_AMBULATORY_CARE_PROVIDER_SITE_OTHER): Payer: Medicare PPO | Admitting: Emergency Medicine

## 2020-02-18 DIAGNOSIS — C3411 Malignant neoplasm of upper lobe, right bronchus or lung: Secondary | ICD-10-CM

## 2020-02-18 DIAGNOSIS — J449 Chronic obstructive pulmonary disease, unspecified: Secondary | ICD-10-CM | POA: Diagnosis not present

## 2020-02-18 DIAGNOSIS — J9611 Chronic respiratory failure with hypoxia: Secondary | ICD-10-CM | POA: Diagnosis not present

## 2020-02-18 LAB — CBC
HCT: 37.5 % — ABNORMAL LOW (ref 39.0–52.0)
Hemoglobin: 11.8 g/dL — ABNORMAL LOW (ref 13.0–17.0)
MCHC: 31.3 g/dL (ref 30.0–36.0)
MCV: 98.6 fl (ref 78.0–100.0)
Platelets: 306 10*3/uL (ref 150.0–400.0)
RBC: 3.81 Mil/uL — ABNORMAL LOW (ref 4.22–5.81)
RDW: 17.2 % — ABNORMAL HIGH (ref 11.5–15.5)
WBC: 8.2 10*3/uL (ref 4.0–10.5)

## 2020-02-18 LAB — BASIC METABOLIC PANEL
BUN: 9 mg/dL (ref 6–23)
CO2: 41 mEq/L — ABNORMAL HIGH (ref 19–32)
Calcium: 8.9 mg/dL (ref 8.4–10.5)
Chloride: 96 mEq/L (ref 96–112)
Creatinine, Ser: 0.82 mg/dL (ref 0.40–1.50)
GFR: 110.46 mL/min (ref >60.00)
Glucose, Bld: 88 mg/dL (ref 70–99)
Potassium: 4.5 mEq/L (ref 3.5–5.1)
Sodium: 139 mEq/L (ref 135–145)

## 2020-02-18 LAB — PROTIME-INR
INR: 1 ratio (ref 0.8–1.0)
Prothrombin Time: 11.5 s (ref 9.6–13.1)

## 2020-02-18 NOTE — Assessment & Plan Note (Signed)
Please continue your Trelegy once daily as you have been taking it.  Rinse and gargle after using. Keep your albuterol available to use 2 puffs if needed for shortness of breath, chest tightness, wheezing.

## 2020-02-18 NOTE — Progress Notes (Signed)
Subjective:    Patient ID: Ryan Contreras, male    DOB: February 16, 1941, 79 y.o.   MRN: [id number]  HPI   ROV 02/10/2020 --79 year old man with COPD and hypoxemic respiratory failure, recurrent squamous cell lung cancer being followed by Dr. Earlie Server and posterior tactic radiation therapy.  His most recent screening CT was done 01/20/2020 which showed an enlarging right upper lobe rounded opacity concerning for recurrence.  PET scan on 2/24 reviewed by me shows intense hypermetabolism of the enlarging lesion and and the parabronchial soft tissue adjacent to the bronchus intermedius.  He is here to discuss these results and plan next steps. He reports that he has been having some hemoptysis since last week. His breathing is probably progressively worse. He did not bring his oxygen with him today - poor compliance. He is desaturated today walking into the office. He is on trelegy, albuterol - uses about 1-2x a day.   His sister [name] is his family contact.   ROV 02/18/2020 --79 year old man with significant COPD and associated hypoxemic respiratory failure, recurrent squamous cell lung cancer being followed by Dr. Julien Nordmann post stereotactic radiation therapy.  He now has an enlarging right upper lobe rounded opacity concerning for recurrence, hypermetabolic on PET scan with some associated adjacent soft tissue opacity in the bronchus intermedius.  I have him scheduled for navigational bronchoscopy and endobronchial ultrasound on 3/16.  He is on Plavix and will need to stop this at least 5 days prior to the procedure.  He was desaturated when he arrived, was not wearing his oxygen, has had poor compliance in the past. He tells me that his sister will be able to accompany him for procedure next week. Remains on trelegy, albuterol 1-2x a day. No longer on prednisone for flare in February. Breathing is stable, still has exertional SOB with minimal workload.                Review of Systems As per HPI      Objective:   Physical Exam Vitals:   02/18/20 1048  BP: (!) 142/66  Pulse: (!) 106  Temp: (!) 97.2 F (36.2 C)  TempSrc: Temporal  SpO2: 94%  Weight: 231 lb 9.6 oz (105.1 kg)  Height: 6\' 1"  (1.854 m)   Gen: Pleasant, well-nourished, in no distress,  normal affect  ENT: No lesions,  mouth clear,  oropharynx clear, no postnasal drip, poor dentition  Neck: No JVD, no stridor  Lungs: No use of accessory muscles, very distant, no wheezing or crackles.   Cardiovascular: RRR, heart sounds normal, no murmur or gallops, trace peripheral edema  Musculoskeletal: No deformities, no cyanosis or clubbing  Neuro: alert, non focal  Skin: Warm, no lesions or rash     Assessment & Plan:  Malignant neoplasm of right upper lobe of lung ([place]) We have you scheduled for bronchoscopy under general anesthesia on Tuesday 02/25/2020.  You will need your sister to drive you to the hospital that day. You will need to stop your Plavix and your aspirin in preparation for the procedure as below: -The last day for your Plavix is Wednesday, 02/18/2020 -The last day for your aspirin is Saturday, 02/22/2020 Follow with Dr Lamonte Sakai in 1 month  COPD  GOLD C Please continue your Trelegy once daily as you have been taking it.  Rinse and gargle after using. Keep your albuterol available to use 2 puffs if needed for shortness of breath, chest tightness, wheezing.   Chronic respiratory failure with hypoxia (HCC)  With poor oxygen compliance.  Suspect that one of the barriers is the size and weight of his standard tanks.  We will titrate him on a pulsed system today, order a POC through his insurance/DME.  Baltazar Apo, MD, PhD 02/18/2020, 11:05 AM [facility] [phone] or if no answer [phone]

## 2020-02-18 NOTE — Addendum Note (Signed)
Addended by: Tery Sanfilippo R on: 02/18/2020 12:26 PM   Modules accepted: Orders

## 2020-02-18 NOTE — Patient Instructions (Addendum)
We have you scheduled for bronchoscopy under general anesthesia on Tuesday 02/25/2020.  You will need your sister to drive you to the hospital that day. You will need to stop your Plavix and your aspirin in preparation for the procedure as below: -The last day for your Plavix is Wednesday, 02/18/2020 -The last day for your aspirin is Saturday, 02/22/2020 Please continue your Trelegy once daily as you have been taking it.  Rinse and gargle after using. Keep your albuterol available to use 2 puffs if needed for shortness of breath, chest tightness, wheezing. We will perform a walking oxygen titration today to see if you can qualify for a portable oxygen concentrator.  If so the we will order for you. Follow with Dr Lamonte Sakai in 1 month

## 2020-02-18 NOTE — Assessment & Plan Note (Signed)
With poor oxygen compliance.  Suspect that one of the barriers is the size and weight of his standard tanks.  We will titrate him on a pulsed system today, order a POC through his insurance/DME.

## 2020-02-18 NOTE — H&P (View-Only) (Signed)
Subjective:    Patient ID: Ryan Contreras, male    DOB: 1941-03-14, 79 y.o.   MRN: [id number]  HPI   ROV 02/10/2020 --79 year old man with COPD and hypoxemic respiratory failure, recurrent squamous cell lung cancer being followed by Dr. Earlie Server and posterior tactic radiation therapy.  His most recent screening CT was done 01/20/2020 which showed an enlarging right upper lobe rounded opacity concerning for recurrence.  PET scan on 2/24 reviewed by me shows intense hypermetabolism of the enlarging lesion and and the parabronchial soft tissue adjacent to the bronchus intermedius.  He is here to discuss these results and plan next steps. He reports that he has been having some hemoptysis since last week. His breathing is probably progressively worse. He did not bring his oxygen with him today - poor compliance. He is desaturated today walking into the office. He is on trelegy, albuterol - uses about 1-2x a day.   His sister [name] is his family contact.   ROV 02/18/2020 --79 year old man with significant COPD and associated hypoxemic respiratory failure, recurrent squamous cell lung cancer being followed by Dr. Julien Nordmann post stereotactic radiation therapy.  He now has an enlarging right upper lobe rounded opacity concerning for recurrence, hypermetabolic on PET scan with some associated adjacent soft tissue opacity in the bronchus intermedius.  I have him scheduled for navigational bronchoscopy and endobronchial ultrasound on 3/16.  He is on Plavix and will need to stop this at least 5 days prior to the procedure.  He was desaturated when he arrived, was not wearing his oxygen, has had poor compliance in the past. He tells me that his sister will be able to accompany him for procedure next week. Remains on trelegy, albuterol 1-2x a day. No longer on prednisone for flare in February. Breathing is stable, still has exertional SOB with minimal workload.                Review of Systems As per HPI       Objective:   Physical Exam Vitals:   02/18/20 1048  BP: (!) 142/66  Pulse: (!) 106  Temp: (!) 97.2 F (36.2 C)  TempSrc: Temporal  SpO2: 94%  Weight: 231 lb 9.6 oz (105.1 kg)  Height: 6\' 1"  (1.854 m)   Gen: Pleasant, well-nourished, in no distress,  normal affect  ENT: No lesions,  mouth clear,  oropharynx clear, no postnasal drip, poor dentition  Neck: No JVD, no stridor  Lungs: No use of accessory muscles, very distant, no wheezing or crackles.   Cardiovascular: RRR, heart sounds normal, no murmur or gallops, trace peripheral edema  Musculoskeletal: No deformities, no cyanosis or clubbing  Neuro: alert, non focal  Skin: Warm, no lesions or rash     Assessment & Plan:  Malignant neoplasm of right upper lobe of lung ([place]) We have you scheduled for bronchoscopy under general anesthesia on Tuesday 02/25/2020.  You will need your sister to drive you to the hospital that day. You will need to stop your Plavix and your aspirin in preparation for the procedure as below: -The last day for your Plavix is Wednesday, 02/18/2020 -The last day for your aspirin is Saturday, 02/22/2020 Follow with Dr Lamonte Sakai in 1 month  COPD  GOLD C Please continue your Trelegy once daily as you have been taking it.  Rinse and gargle after using. Keep your albuterol available to use 2 puffs if needed for shortness of breath, chest tightness, wheezing.   Chronic respiratory failure with hypoxia (  [place]) With poor oxygen compliance.  Suspect that one of the barriers is the size and weight of his standard tanks.  We will titrate him on a pulsed system today, order a POC through his insurance/DME.  Baltazar Apo, MD, PhD 02/18/2020, 11:05 AM  [facility] [phone] or if no answer [phone]

## 2020-02-18 NOTE — Addendum Note (Signed)
Addended by: Suzzanne Cloud E on: 02/18/2020 11:49 AM   Modules accepted: Orders

## 2020-02-18 NOTE — Assessment & Plan Note (Signed)
We have you scheduled for bronchoscopy under general anesthesia on Tuesday 02/25/2020.  You will need your sister to drive you to the hospital that day. You will need to stop your Plavix and your aspirin in preparation for the procedure as below: -The last day for your Plavix is Wednesday, 02/18/2020 -The last day for your aspirin is Saturday, 02/22/2020 Follow with Dr Lamonte Sakai in 1 month

## 2020-02-19 ENCOUNTER — Telehealth: Payer: Self-pay | Admitting: Emergency Medicine

## 2020-02-19 NOTE — Telephone Encounter (Signed)
Spoke with [name], changed order wording to match Dr. Agustina Caroli note.  Nothing further needed at this time.

## 2020-02-19 NOTE — Addendum Note (Signed)
Addended by: Amado Coe on: 02/19/2020 09:36 AM   Modules accepted: Orders

## 2020-02-22 ENCOUNTER — Other Ambulatory Visit (HOSPITAL_COMMUNITY)
Admission: RE | Admit: 2020-02-22 | Discharge: 2020-02-22 | Disposition: A | Discharge status: Home / Self Care | Payer: Medicare PPO | Source: Ambulatory Visit | Attending: Emergency Medicine | Admitting: Emergency Medicine

## 2020-02-22 DIAGNOSIS — Z01812 Encounter for preprocedural laboratory examination: Secondary | ICD-10-CM | POA: Diagnosis not present

## 2020-02-22 DIAGNOSIS — Z20822 Contact with and (suspected) exposure to covid-19: Secondary | ICD-10-CM | POA: Diagnosis not present

## 2020-02-22 LAB — SARS CORONAVIRUS 2 (TAT 6-24 HRS): SARS Coronavirus 2: NEGATIVE

## 2020-02-24 ENCOUNTER — Encounter (HOSPITAL_COMMUNITY): Payer: Self-pay | Admitting: Emergency Medicine

## 2020-02-24 ENCOUNTER — Other Ambulatory Visit: Payer: Self-pay

## 2020-02-24 ENCOUNTER — Telehealth: Payer: Self-pay | Admitting: Emergency Medicine

## 2020-02-24 NOTE — Progress Notes (Signed)
Spoke with pt for pre-op call. Pt has hx of CAD with 1 stent in 2013. Pt's cardiologist is Dr. Harrington Challenger, last office visit was 08/05/19. Pt is on Plavix and Aspirin, Dr. Lamonte Sakai instructed him to stop Plavix on 02/18/20, pt states he thinks the last dose of Plavix with the 10th. Pt states his last dose of Aspirin was 02/22/20 which is the day Dr. Lamonte Sakai told him to stop it. Pt denies any recent chest pain. He states he's sob most of the time and uses Oxygen at night for his COPD. Pt denies being diabetic.  Covid test was done on 3/13/21and it's negative. Pt states he's had both of the Covid vaccines. Pt states he's been in quarantine since the test was done and understands to stay in quarantine until he comes to the hospital tomorrow.

## 2020-02-24 NOTE — Telephone Encounter (Signed)
Spoke with pt, reviewed bronch preop info with pt to best of my ability.  Pt expressed understanding.  Nothing further needed at this time- will close encounter.

## 2020-02-25 ENCOUNTER — Ambulatory Visit (HOSPITAL_COMMUNITY): Payer: Medicare PPO | Admitting: Certified Registered"

## 2020-02-25 ENCOUNTER — Other Ambulatory Visit: Payer: Self-pay | Admitting: *Deleted

## 2020-02-25 ENCOUNTER — Ambulatory Visit (HOSPITAL_COMMUNITY)
Admission: RE | Admit: 2020-02-25 | Discharge: 2020-02-25 | Disposition: A | Discharge status: Home / Self Care | Payer: Medicare PPO | Attending: Emergency Medicine | Admitting: Emergency Medicine

## 2020-02-25 ENCOUNTER — Other Ambulatory Visit: Payer: Self-pay

## 2020-02-25 ENCOUNTER — Encounter (HOSPITAL_COMMUNITY)
Admission: RE | Disposition: A | Discharge status: Home / Self Care | Payer: Self-pay | Source: Home / Self Care | Attending: Emergency Medicine

## 2020-02-25 ENCOUNTER — Encounter (HOSPITAL_COMMUNITY): Payer: Self-pay | Admitting: Emergency Medicine

## 2020-02-25 DIAGNOSIS — Z9981 Dependence on supplemental oxygen: Secondary | ICD-10-CM | POA: Insufficient documentation

## 2020-02-25 DIAGNOSIS — Z7902 Long term (current) use of antithrombotics/antiplatelets: Secondary | ICD-10-CM | POA: Diagnosis not present

## 2020-02-25 DIAGNOSIS — C3432 Malignant neoplasm of lower lobe, left bronchus or lung: Secondary | ICD-10-CM | POA: Diagnosis not present

## 2020-02-25 DIAGNOSIS — R918 Other nonspecific abnormal finding of lung field: Secondary | ICD-10-CM | POA: Diagnosis present

## 2020-02-25 DIAGNOSIS — J9611 Chronic respiratory failure with hypoxia: Secondary | ICD-10-CM | POA: Diagnosis not present

## 2020-02-25 DIAGNOSIS — C3411 Malignant neoplasm of upper lobe, right bronchus or lung: Secondary | ICD-10-CM | POA: Diagnosis not present

## 2020-02-25 DIAGNOSIS — J449 Chronic obstructive pulmonary disease, unspecified: Secondary | ICD-10-CM | POA: Diagnosis not present

## 2020-02-25 DIAGNOSIS — C3490 Malignant neoplasm of unspecified part of unspecified bronchus or lung: Secondary | ICD-10-CM | POA: Diagnosis not present

## 2020-02-25 DIAGNOSIS — I2510 Atherosclerotic heart disease of native coronary artery without angina pectoris: Secondary | ICD-10-CM | POA: Diagnosis not present

## 2020-02-25 HISTORY — PX: BRONCHIAL BRUSHINGS: SHX5108

## 2020-02-25 HISTORY — PX: BRONCHIAL BIOPSY: SHX5109

## 2020-02-25 HISTORY — PX: VIDEO BRONCHOSCOPY: SHX5072

## 2020-02-25 LAB — GLUCOSE, CAPILLARY: Glucose-Capillary: 113 mg/dL — ABNORMAL HIGH (ref 70–99)

## 2020-02-25 SURGERY — VIDEO BRONCHOSCOPY WITHOUT FLUORO
Anesthesia: General

## 2020-02-25 MED ORDER — LIDOCAINE 2% (20 MG/ML) 5 ML SYRINGE
INTRAMUSCULAR | Status: DC | PRN
  Administered 2020-02-25: 100 mg via INTRAVENOUS

## 2020-02-25 MED ORDER — PROPOFOL 10 MG/ML IV BOLUS
INTRAVENOUS | Status: DC | PRN
  Administered 2020-02-25: 120 mg via INTRAVENOUS

## 2020-02-25 MED ORDER — ROCURONIUM BROMIDE 50 MG/5ML IV SOSY
PREFILLED_SYRINGE | INTRAVENOUS | Status: DC | PRN
  Administered 2020-02-25: 40 mg via INTRAVENOUS

## 2020-02-25 MED ORDER — PHENYLEPHRINE 40 MCG/ML (10ML) SYRINGE FOR IV PUSH (FOR BLOOD PRESSURE SUPPORT)
PREFILLED_SYRINGE | INTRAVENOUS | Status: DC | PRN
  Administered 2020-02-25: 120 ug via INTRAVENOUS
  Administered 2020-02-25 (×2): 80 ug via INTRAVENOUS

## 2020-02-25 MED ORDER — LACTATED RINGERS IV SOLN: INTRAVENOUS | Status: DC

## 2020-02-25 MED ORDER — SUCCINYLCHOLINE 20MG/ML (10ML) SYRINGE FOR MEDFUSION PUMP - OPTIME
INTRAMUSCULAR | Status: DC | PRN
  Administered 2020-02-25: 120 mg via INTRAVENOUS

## 2020-02-25 MED ORDER — SUGAMMADEX SODIUM 200 MG/2ML IV SOLN
INTRAVENOUS | Status: DC | PRN
  Administered 2020-02-25: 200 mg via INTRAVENOUS

## 2020-02-25 MED ORDER — CLOPIDOGREL BISULFATE 75 MG PO TABS: 75.0000 mg | ORAL_TABLET | Freq: Every day | ORAL | Status: DC

## 2020-02-25 MED ORDER — ONDANSETRON HCL 4 MG/2ML IJ SOLN
INTRAMUSCULAR | Status: DC | PRN
  Administered 2020-02-25: 4 mg via INTRAVENOUS

## 2020-02-25 NOTE — Op Note (Signed)
Video Bronchoscopy Procedure Note  Date of Operation: 02/25/2020  Pre-op Diagnosis: Right upper lobe mass, history non-small cell lung cancer  Post-op Diagnosis: Same  Surgeon: Baltazar Apo  Assistants: none  Anesthesia: General anesthesia with endotracheal intubation  Operation: Flexible video fiberoptic bronchoscopy and biopsies.  Estimated Blood Loss: 15 cc  Complications: none noted  Indications and History: Ryan Contreras is 79 y.o. with history of non-small cell lung cancer.  He was found to have new evolving right upper lobe masses on surveillance CT chest.  Recommendation was made to achieve a tissue diagnosis via bronchoscopy with biopsies.  The risks, benefits, complications, treatment options and expected outcomes were discussed with the patient.  The possibilities of pneumothorax, pneumonia, reaction to medication, pulmonary aspiration, perforation of a viscus, bleeding, failure to diagnose a condition and creating a complication requiring transfusion or operation were discussed with the patient who freely signed the consent.    Description of Procedure: The patient was seen in the Preoperative Area, was examined and was deemed appropriate to proceed.  The patient was taken to [facility] endoscopy room 2, identified as Ryan Contreras and the procedure verified as Flexible Video Fiberoptic Bronchoscopy.  A Time Out was held and the above information confirmed.   General anesthesia and mechanical ventilation was initiated.  The video fiberoptic bronchoscope was introduced via the endotracheal tube and a general inspection was performed which showed normal main carina.  There were blood-tinged secretions at the main carina which were easily suctioned.  The R sided airways were inspected and showed fishmouth narrowing of the right upper lobe bronchus with a white friable vascular endobronchial lesion emanating from the right upper lobe anterior segment.  A circumferential irregular  white mass was noted narrowing the bronchus intermedius.  This was friable and bled when manipulated.  The scope would pass through the narrowed bronchus intermedius into the lower lobe airways and middle lobe airways which appeared to be normal.  The left side was then inspected.  The left lower lobe lingular and left upper lobe airways were all normal without any evidence of endobronchial lesion.  Cold saline was used to irrigate the right upper lobe and bronchus intermedius. Endobronchial brushings, endobronchial forceps biopsies were performed first from the anterior segment of the right upper lobe and then from the bronchus intermedius. There was some initial moderate bleeding that stopped quickly. The patient tolerated the procedure well. The bronchoscope was removed. There were no obvious complications.   Samples: 1. Endobronchial brushings from right upper lobe anterior segment 2.  Endobronchial brushings from the bronchus intermedius 3.  Endobronchial forceps biopsies from the right upper lobe anterior segment 4.  Endobronchial forceps biopsies from the bronchus intermedius  Plans:  We will review the cytology, pathology results with the patient when they become available.  Outpatient followup will be with Dr Lamonte Sakai and Dr. Julien Nordmann.    Baltazar Apo, MD, PhD 02/25/2020, 12:44 PM [facility] [phone] or if no answer [phone]

## 2020-02-25 NOTE — Anesthesia Preprocedure Evaluation (Addendum)
Anesthesia Evaluation  Patient identified by MRN, date of birth, ID band Patient awake    Reviewed: Allergy & Precautions, NPO status , Patient's Chart, lab work & pertinent test results  History of Anesthesia Complications Negative for: history of anesthetic complications  Airway Mallampati: II  TM Distance: >3 FB Neck ROM: Full    Dental  (+) Edentulous Upper, Edentulous Lower   Pulmonary COPD,  oxygen dependent, former smoker,  RUL nodule   Pulmonary exam normal        Cardiovascular hypertension, + CAD and + Cardiac Stents  Normal cardiovascular exam     Neuro/Psych negative neurological ROS  negative psych ROS   GI/Hepatic Neg liver ROS, PUD,   Endo/Other  diabetes, Type 2  Renal/GU negative Renal ROS  negative genitourinary   Musculoskeletal  (+) Arthritis ,   Abdominal   Peds negative pediatric ROS (+)  Hematology  (+) Blood dyscrasia, anemia ,   Anesthesia Other Findings  Echo 05/15/12: EF 55-60%, PASP 40  Stress test 2019: EF 57%, low risk study, no evidence for ischemia or prior infarct  Reproductive/Obstetrics negative OB ROS                            Anesthesia Physical Anesthesia Plan  ASA: III  Anesthesia Plan: General   Post-op Pain Management:    Induction: Intravenous  PONV Risk Score and Plan: Ondansetron, Dexamethasone, Treatment may vary due to age or medical condition and Midazolam  Airway Management Planned: Oral ETT  Additional Equipment: None  Intra-op Plan:   Post-operative Plan: Extubation in OR  Informed Consent: I have reviewed the patients History and Physical, chart, labs and discussed the procedure including the risks, benefits and alternatives for the proposed anesthesia with the patient or authorized representative who has indicated his/her understanding and acceptance.     Dental advisory given  Plan Discussed with:   Anesthesia  Plan Comments:         Anesthesia Quick Evaluation

## 2020-02-25 NOTE — Anesthesia Procedure Notes (Addendum)
Procedure Name: Intubation Date/Time: 02/25/2020 12:09 PM Performed by: Griffin Dakin, CRNA Pre-anesthesia Checklist: Patient identified, Emergency Drugs available, Suction available and Patient being monitored Patient Re-evaluated:Patient Re-evaluated prior to induction Oxygen Delivery Method: Circle system utilized Preoxygenation: Pre-oxygenation with 100% oxygen Induction Type: IV induction Ventilation: Mask ventilation without difficulty Laryngoscope Size: Mac and 4 Grade View: Grade I Tube type: Oral Tube size: 8.5 mm Number of attempts: 1 Airway Equipment and Method: Stylet Placement Confirmation: ETT inserted through vocal cords under direct vision,  positive ETCO2 and breath sounds checked- equal and bilateral Secured at: 23 cm Tube secured with: Tape Dental Injury: Teeth and Oropharynx as per pre-operative assessment

## 2020-02-25 NOTE — Anesthesia Postprocedure Evaluation (Signed)
Anesthesia Post Note  Patient: Ryan Contreras  Procedure(s) Performed: VIDEO BRONCHOSCOPY WITHOUT FLUORO BRONCHIAL BRUSHINGS BRONCHIAL BIOPSIES HEMOSTASIS CONTROL     Patient location during evaluation: PACU Anesthesia Type: General Level of consciousness: awake and alert Pain management: pain level controlled Vital Signs Assessment: post-procedure vital signs reviewed and stable Respiratory status: spontaneous breathing, nonlabored ventilation, respiratory function stable and patient connected to nasal cannula oxygen Cardiovascular status: blood pressure returned to baseline and stable Postop Assessment: no apparent nausea or vomiting Anesthetic complications: no Comments: At baseline O2 sat with normal home oxygen    Last Vitals:  Vitals:   02/25/20 1344 02/25/20 1417  BP: (!) 143/66 121/61  Pulse: 100 99  Resp: 20 16  Temp:  (!) 36.1 C  SpO2: 92% 90%    Last Pain:  Vitals:   02/25/20 1329  TempSrc:   PainSc: 0-No pain                 Lidia Collum

## 2020-02-25 NOTE — Discharge Instructions (Signed)
Flexible Bronchoscopy, Care After This sheet gives you information about how to care for yourself after your test. Your doctor may also give you more specific instructions. If you have problems or questions, contact your doctor. Follow these instructions at home: Eating and drinking  Do not eat or drink anything (not even water) for 2 hours after your test, or until your numbing medicine (local anesthetic) wears off.  When your numbness is gone and your cough and gag reflexes have come back, you may: ? Eat only soft foods. ? Slowly drink liquids.  The day after the test, go back to your normal diet. Driving  Do not drive for 24 hours if you were given a medicine to help you relax (sedative).  Do not drive or use heavy machinery while taking prescription pain medicine. General instructions   Take over-the-counter and prescription medicines only as told by your doctor.  Return to your normal activities as told. Ask what activities are safe for you.  Do not use any products that have nicotine or tobacco in them. This includes cigarettes and e-cigarettes. If you need help quitting, ask your doctor.  Keep all follow-up visits as told by your doctor. This is important. It is very important if you had a tissue sample (biopsy) taken. Get help right away if:  You have shortness of breath that gets worse.  You get light-headed.  You feel like you are going to pass out (faint).  You have chest pain.  You cough up: ? More than a little blood. ? More blood than before. Summary  Do not eat or drink anything (not even water) for 2 hours after your test, or until your numbing medicine wears off.  Do not use cigarettes. Do not use e-cigarettes.  Get help right away if you have chest pain.  These call our office for any questions or concerns.  [phone].  This information is not intended to replace advice given to you by your health care provider. Make sure you discuss any  questions you have with your health care provider. Document Revised: 11/10/2017 Document Reviewed: 12/16/2016 Elsevier Patient Education  2020 [employer].

## 2020-02-25 NOTE — Transfer of Care (Signed)
Immediate Anesthesia Transfer of Care Note  Patient: Ryan Contreras  Procedure(s) Performed: VIDEO BRONCHOSCOPY WITHOUT FLUORO BRONCHIAL BRUSHINGS BRONCHIAL BIOPSIES HEMOSTASIS CONTROL  Patient Location: PACU  Anesthesia Type:General  Level of Consciousness: awake, alert  and oriented  Airway & Oxygen Therapy: Patient Spontanous Breathing and Patient connected to face mask oxygen  Post-op Assessment: Report given to RN and Post -op Vital signs reviewed and stable  Post vital signs: Reviewed and stable  Last Vitals:  Vitals Value Taken Time  BP    Temp    Pulse 93 02/25/20 1257  Resp    SpO2 79 % 02/25/20 1257  Vitals shown include unvalidated device data.  Last Pain:  Vitals:   02/25/20 1002  TempSrc:   PainSc: 0-No pain         Complications: No apparent anesthesia complications

## 2020-02-25 NOTE — Interval H&P Note (Signed)
History and Physical Interval Note:  02/25/2020 11:23 AM  Ryan Contreras  has presented today for surgery, with the diagnosis of RIGHT UPPER LOPE NODULE.  The various methods of treatment have been discussed with the patient and family. After consideration of risks, benefits and other options for treatment, the patient has consented to  Procedure(s): VIDEO BRONCHOSCOPY WITH ENDOBRONCHIAL NAVIGATION (N/A) VIDEO BRONCHOSCOPY WITH ENDOBRONCHIAL ULTRASOUND (N/A) as a surgical intervention.  The patient's history has been reviewed, patient examined, no change in status, stable for surgery.  I have reviewed the patient's chart and labs.  Questions were answered to the patient's satisfaction.     Collene Gobble

## 2020-02-26 ENCOUNTER — Telehealth: Payer: Self-pay | Admitting: Pulmonary Disease

## 2020-02-26 ENCOUNTER — Inpatient Hospital Stay: Payer: Medicare PPO | Admitting: Internal Medicine

## 2020-02-26 ENCOUNTER — Inpatient Hospital Stay: Payer: Medicare PPO

## 2020-02-26 LAB — CYTOLOGY - NON PAP

## 2020-02-26 MED ORDER — HYDROCODONE-HOMATROPINE 5-1.5 MG/5ML PO SYRP: 5.0000 mL | ORAL_SOLUTION | Freq: Four times a day (QID) | ORAL | 0 refills | Status: DC | PRN

## 2020-02-26 NOTE — Telephone Encounter (Signed)
Sent script for hycodan to his pharmacy.

## 2020-02-26 NOTE — Addendum Note (Signed)
Addended by: Collene Gobble on: 02/26/2020 01:56 PM   Modules accepted: Orders

## 2020-02-26 NOTE — Telephone Encounter (Signed)
I spoke with the patient - he describes some jitteriness, low appetite and some weakness. Cough is increased - he was seeing some hemoptysis but this has cleared up. No increased dyspnea. No purulent sputum.

## 2020-02-26 NOTE — Telephone Encounter (Signed)
Called and spoke to pt. Pt states overall he feels fatigued and feels this is due to the bronch he had on 3/16. Pt denies any other s/s. Pt states he has an appt today with Dr. Julien Nordmann but will not be able to make it due to transportation issues. Pt aware to inform Dr. Julien Nordmann of his fatigue when he calls about his appt. Advised pt to rest and will send to Dr. Lamonte Sakai.   Will forward to Dr. Lamonte Sakai as Ryan Contreras.

## 2020-02-26 NOTE — Telephone Encounter (Signed)
Pt cancelled appt today -not feeling well after bronchoscopy>"shakey". R/S appt with Julien Nordmann for tomorrow at 1000.PT aware.

## 2020-02-27 ENCOUNTER — Inpatient Hospital Stay: Payer: Medicare PPO

## 2020-02-27 ENCOUNTER — Inpatient Hospital Stay: Payer: Medicare PPO | Admitting: Internal Medicine

## 2020-02-27 ENCOUNTER — Telehealth: Payer: Self-pay | Admitting: Internal Medicine

## 2020-02-27 ENCOUNTER — Telehealth: Payer: Self-pay | Admitting: Emergency Medicine

## 2020-02-27 LAB — CYTOLOGY - NON PAP

## 2020-02-27 LAB — SURGICAL PATHOLOGY

## 2020-02-27 NOTE — Telephone Encounter (Signed)
Called pt per RN Diane r/s 3/18 appt to next wk - unable to reach pt - left message with apt date and time

## 2020-02-27 NOTE — Telephone Encounter (Signed)
Called pt to review bx results w him. Left message. He was supposed to see Dr Julien Nordmann today. Will try him back to insure that this went to the visit, understands the results.

## 2020-02-27 NOTE — Telephone Encounter (Signed)
Spoke with the patient, reviewed his bx results w him. His Oncology appt has been moved to next Tuesday. I encouraged him to attend. He will call me w any problems.

## 2020-02-28 ENCOUNTER — Other Ambulatory Visit: Payer: Self-pay

## 2020-02-28 ENCOUNTER — Inpatient Hospital Stay (HOSPITAL_COMMUNITY)
Admission: EM | Admit: 2020-02-28 | Discharge: 2020-03-01 | DRG: 189 | Disposition: A | Discharge status: Home Health | Payer: Medicare PPO | Attending: Family Medicine | Admitting: Family Medicine

## 2020-02-28 ENCOUNTER — Emergency Department (HOSPITAL_COMMUNITY): Payer: Medicare PPO

## 2020-02-28 DIAGNOSIS — Z87891 Personal history of nicotine dependence: Secondary | ICD-10-CM | POA: Diagnosis not present

## 2020-02-28 DIAGNOSIS — Z8701 Personal history of pneumonia (recurrent): Secondary | ICD-10-CM | POA: Diagnosis not present

## 2020-02-28 DIAGNOSIS — I10 Essential (primary) hypertension: Secondary | ICD-10-CM | POA: Diagnosis present

## 2020-02-28 DIAGNOSIS — J449 Chronic obstructive pulmonary disease, unspecified: Secondary | ICD-10-CM | POA: Diagnosis not present

## 2020-02-28 DIAGNOSIS — M1990 Unspecified osteoarthritis, unspecified site: Secondary | ICD-10-CM | POA: Diagnosis present

## 2020-02-28 DIAGNOSIS — E785 Hyperlipidemia, unspecified: Secondary | ICD-10-CM | POA: Diagnosis present

## 2020-02-28 DIAGNOSIS — W19XXXA Unspecified fall, initial encounter: Secondary | ICD-10-CM | POA: Diagnosis not present

## 2020-02-28 DIAGNOSIS — J9621 Acute and chronic respiratory failure with hypoxia: Secondary | ICD-10-CM | POA: Diagnosis not present

## 2020-02-28 DIAGNOSIS — C3432 Malignant neoplasm of lower lobe, left bronchus or lung: Secondary | ICD-10-CM | POA: Diagnosis present

## 2020-02-28 DIAGNOSIS — Z7982 Long term (current) use of aspirin: Secondary | ICD-10-CM | POA: Diagnosis not present

## 2020-02-28 DIAGNOSIS — Z8711 Personal history of peptic ulcer disease: Secondary | ICD-10-CM

## 2020-02-28 DIAGNOSIS — Z8261 Family history of arthritis: Secondary | ICD-10-CM | POA: Diagnosis not present

## 2020-02-28 DIAGNOSIS — I2510 Atherosclerotic heart disease of native coronary artery without angina pectoris: Secondary | ICD-10-CM | POA: Diagnosis present

## 2020-02-28 DIAGNOSIS — J441 Chronic obstructive pulmonary disease with (acute) exacerbation: Secondary | ICD-10-CM | POA: Diagnosis present

## 2020-02-28 DIAGNOSIS — R0902 Hypoxemia: Secondary | ICD-10-CM | POA: Diagnosis not present

## 2020-02-28 DIAGNOSIS — Z20822 Contact with and (suspected) exposure to covid-19: Secondary | ICD-10-CM | POA: Diagnosis present

## 2020-02-28 DIAGNOSIS — C3411 Malignant neoplasm of upper lobe, right bronchus or lung: Secondary | ICD-10-CM | POA: Diagnosis present

## 2020-02-28 DIAGNOSIS — R0602 Shortness of breath: Secondary | ICD-10-CM | POA: Diagnosis not present

## 2020-02-28 DIAGNOSIS — Z7902 Long term (current) use of antithrombotics/antiplatelets: Secondary | ICD-10-CM

## 2020-02-28 DIAGNOSIS — R531 Weakness: Secondary | ICD-10-CM | POA: Diagnosis not present

## 2020-02-28 DIAGNOSIS — Z8249 Family history of ischemic heart disease and other diseases of the circulatory system: Secondary | ICD-10-CM | POA: Diagnosis not present

## 2020-02-28 DIAGNOSIS — Z955 Presence of coronary angioplasty implant and graft: Secondary | ICD-10-CM | POA: Diagnosis not present

## 2020-02-28 DIAGNOSIS — Z79899 Other long term (current) drug therapy: Secondary | ICD-10-CM

## 2020-02-28 DIAGNOSIS — J9622 Acute and chronic respiratory failure with hypercapnia: Secondary | ICD-10-CM | POA: Diagnosis present

## 2020-02-28 DIAGNOSIS — M109 Gout, unspecified: Secondary | ICD-10-CM | POA: Diagnosis present

## 2020-02-28 NOTE — ED Provider Notes (Signed)
[facility] DEPT Provider Note   CSN: [id number] Arrival date & time: 02/28/20  2130     History Chief Complaint  Patient presents with  . Shortness of Breath  . Cough    Ryan Contreras is a 79 y.o. male.  HPI Patient has chronic lung disease.  History of lung cancer and recently diagnosed with recurrence of the lung cancer.  Had bronchoscopy on Tuesday.  Reviewing results it appears that it showed recurrence of the cancer and patient was informed of the results, however patient states he has not been told about them yet.  States 2 days ago began to have more shortness of breath.  Said occasional cough with both bloody and purulent production.  States he feels more short of breath and for EMS had sats of 78% on his home 2 L.  States he feels weak all over.  No fevers.  Somewhat decreased oral intake.    Past Medical History:  Diagnosis Date  . Arthritis   . CAD, NATIVE VESSEL    coronary stent x1-tx. Plavix use.  Marland Kitchen COPD (chronic obstructive pulmonary disease) ([place])    due to 50+ years Smoking.  Marland Kitchen Dyspnea    wears oxygen PRN  . Gastrointestinal bleeding, upper 04/29/2016  . Gout, unspecified    foot  . HYPERLIPIDEMIA   . HYPERTENSION   . Lung mass   . NSCL ca dx'd 12/2015   Lung  . On supplemental oxygen by nasal cannula    PRN  . PEPTIC ULCER DISEASE   . Pneumonia   . TOBACCO ABUSE   . Type 2 diabetes mellitus ([place])    denies on 01-06-16    Patient Active Problem List   Diagnosis Date Noted  . Chronic respiratory failure with hypoxia ([place]) 02/18/2020  . History of colonic polyps   . Benign neoplasm of transverse colon   . Malignant neoplasm of bronchus of left lower lobe ([place]) 07/04/2018  . Memory loss 06/26/2017  . Hypercapnic respiratory failure ([place]) 11/22/2016  . GI bleed 05/02/2016  . Anemia 05/02/2016  . Gastrointestinal bleeding, upper 04/29/2016  . Encounter for antineoplastic chemotherapy 02/15/2016  . Prostatitis, chronic  01/23/2016  . 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 Hilar adenopathy 01/11/2016  . Enlargement of lymph nodes   . Other nonspecific abnormal finding of lung field   . Pulmonary nodules 11/26/2015  . COPD  GOLD C 11/02/2015  . Cigarette smoker 11/02/2015  . Dyspnea 09/11/2015  . Edema 08/14/2015  . Diabetes ([place]) 04/13/2015  . Wart viral 01/30/2015  . Wellness examination 11/14/2014  . Neoplasm of uncertain behavior of skin 12/31/2012  . Bruise 07/21/2012  . Otitis media 04/19/2012  . Otitis externa 04/19/2012  . Cough 01/06/2012  . Encounter for long-term (current) use of other medications 12/16/2011  . PSA, INCREASED 08/11/2010  . SKIN RASH 07/14/2010  . TOBACCO ABUSE 03/15/2010  . CAD, NATIVE VESSEL 03/15/2010  . OTHER MALAISE AND FATIGUE 03/15/2010  . OTHER SPEC HYPERTROPHIC&ATROPHIC CONDITION SKIN 10/01/2009  . HYPOKALEMIA 06/03/2009  . Hyperlipidemia 05/14/2008  . Essential hypertension 05/14/2008  . Coronary atherosclerosis 05/14/2008  . PUD (peptic ulcer disease) 05/14/2008  . LEG PAIN 05/14/2008  . Gout 01/21/2008    Past Surgical History:  Procedure Laterality Date  . BRONCHIAL BIOPSY  02/25/2020   Procedure: BRONCHIAL BIOPSIES;  Surgeon: Collene Gobble, MD;  Location[facility] ENDOSCOPY;  Service: Pulmonary;;  . BRONCHIAL BRUSHINGS  02/25/2020  Procedure: BRONCHIAL BRUSHINGS;  Surgeon: Collene Gobble, MD;  Location: [facility] ENDOSCOPY;  Service: Pulmonary;;  . CARDIAC CATHETERIZATION     '06-stent placed, 6'13 - Dr. Lizbeth Bark follows  . COLONOSCOPY WITH PROPOFOL N/A 09/24/2019   Procedure: COLONOSCOPY WITH PROPOFOL;  Surgeon: Irene Shipper, MD;  Location: [facility] ENDOSCOPY;  Service: Endoscopy;  Laterality: N/A;  . CORONARY STENT PLACEMENT     s/p  . ENDOBRONCHIAL ULTRASOUND Bilateral 01/11/2016   Procedure: ENDOBRONCHIAL ULTRASOUND;  Surgeon: Collene Gobble, MD;  Location: [facility] ENDOSCOPY;  Service: Endoscopy;  Laterality: Bilateral;  .  ESOPHAGOGASTRODUODENOSCOPY (EGD) WITH PROPOFOL N/A 09/24/2019   Procedure: ESOPHAGOGASTRODUODENOSCOPY (EGD) WITH PROPOFOL;  Surgeon: Irene Shipper, MD;  Location: [facility] ENDOSCOPY;  Service: Endoscopy;  Laterality: N/A;  . POLYPECTOMY  09/24/2019   Procedure: POLYPECTOMY;  Surgeon: Irene Shipper, MD;  Location: [facility] ENDOSCOPY;  Service: Endoscopy;;  . VIDEO BRONCHOSCOPY  02/25/2020   Procedure: VIDEO BRONCHOSCOPY WITHOUT FLUORO;  Surgeon: Collene Gobble, MD;  Location: [facility] ENDOSCOPY;  Service: Pulmonary;;  . VIDEO BRONCHOSCOPY WITH ENDOBRONCHIAL NAVIGATION Right 03/07/2018   Procedure: VIDEO BRONCHOSCOPY WITH ENDOBRONCHIAL NAVIGATION;  Surgeon: Collene Gobble, MD;  Location: [facility] OR;  Service: Thoracic;  Laterality: Right;       Family History  Problem Relation Age of Onset  . Breast cancer Sister   . Cancer Brother        Uncertain type  . Arthritis Mother   . Hypertension Father   . Heart attack Father   . Colon cancer Neg Hx   . Liver cancer Neg Hx   . Stomach cancer Neg Hx     Social History   Tobacco Use  . Smoking status: Former Smoker    Packs/day: 0.50    Years: 50.00    Pack years: 25.00    Types: Cigarettes    Quit date: 01/12/2018    Years since quitting: 2.1  . Smokeless tobacco: Never Used  . Tobacco comment: stopped smoking cigarettes 2/ 2019  Substance Use Topics  . Alcohol use: Not Currently    Alcohol/week: 0.0 standard drinks  . Drug use: No    Home Medications Prior to Admission medications   Medication Sig Start Date End Date Taking? Authorizing Provider  acetaminophen (TYLENOL) 500 MG tablet Take 250 mg by mouth every 6 (six) hours as needed for moderate pain or headache.     [provider]  allopurinol (ZYLOPRIM) 100 MG tablet TAKE 1 TABLET BY MOUTH EVERY DAY Patient taking differently: Take 100 mg by mouth daily.  01/07/19   Renato Shin, MD  aspirin 81 MG tablet Take 1 tablet (81 mg total) by mouth at bedtime. Restart on 03/09/18 03/07/18   Collene Gobble, MD  atorvastatin (LIPITOR) 80 MG tablet TAKE 1 TABLET BY MOUTH EVERY DAY Patient taking differently: Take 80 mg by mouth daily.  11/01/19   Fay Records, MD  brimonidine (ALPHAGAN) 0.15 % ophthalmic solution Place 1 drop into both eyes in the morning and at bedtime.  01/15/20   [provider]  clopidogrel (PLAVIX) 75 MG tablet Take 1 tablet (75 mg total) by mouth daily. Okay to restart this medication on 02/26/2020 02/25/20   Collene Gobble, MD  docusate sodium (COLACE) 100 MG capsule Take 100 mg by mouth daily.     [provider]  eplerenone (INSPRA) 25 MG tablet TAKE 1 TABLET BY MOUTH EVERY DAY Patient taking differently: Take 25 mg by mouth daily.  09/10/19  Collene Gobble, MD  HYDROcodone-homatropine [facility]) 5-1.5 MG/5ML syrup Take 5 mLs by mouth every 6 (six) hours as needed for cough. 02/26/20   Byrum, Rose Fillers, MD  KLOR-CON M20 20 MEQ tablet TAKE 1 TABLET BY MOUTH DAILY Patient taking differently: Take 20 mEq by mouth daily.  09/09/18   Renato Shin, MD  metoprolol succinate (TOPROL-XL) 25 MG 24 hr tablet Take 25 mg by mouth daily.  08/05/19   [provider]  Na Sulfate-K Sulfate-Mg Sulf 17.5-3.13-1.6 GM/177ML SOLN Suprep-Use as directed 09/23/19   Irene Shipper, MD  OXYGEN Inhale 2 L into the lungs at bedtime.    [provider]  pantoprazole (PROTONIX) 40 MG tablet TAKE 40 MG BY MOUTH EVERY DAY Patient taking differently: Take 40 mg by mouth daily.  03/07/18   Collene Gobble, MD  polyethylene glycol (MIRALAX / GLYCOLAX) 17 g packet Take 17 g by mouth daily as needed for mild constipation or moderate constipation.    [provider]  PROAIR HFA 108 [phone] Base) MCG/ACT inhaler Inhale 2 puffs into the lungs every 6 (six) hours as needed for wheezing or shortness of breath. 02/17/18   [provider]  sildenafil (REVATIO) 20 MG tablet Take 100 mg by mouth daily as needed for erectile dysfunction. 09/02/19   [provider]    TRELEGY ELLIPTA 100-62.5-25 MCG/INH AEPB Inhale 1 puff into the lungs daily. 09/17/19   Collene Gobble, MD    Allergies    Patient has no known allergies.  Review of Systems   Review of Systems  Constitutional: Positive for appetite change.  HENT: Positive for congestion.   Respiratory: Positive for cough and shortness of breath.   Cardiovascular: Positive for leg swelling. Negative for chest pain.  Gastrointestinal: Negative for abdominal pain.  Genitourinary: Negative for flank pain.  Musculoskeletal: Negative for back pain.  Skin: Negative for pallor.  Neurological: Negative for weakness.  Psychiatric/Behavioral: Negative for confusion.    Physical Exam Updated Vital Signs BP (!) 166/98   Pulse 100   Temp 99 F (37.2 C) (Oral)   Resp 16   Ht 6\' 1"  (1.854 m)   Wt 104.3 kg   SpO2 (!) 83%   BMI 30.34 kg/m   Physical Exam Vitals and nursing note reviewed.  Constitutional:      Appearance: He is well-developed.  HENT:     Head: Normocephalic.  Cardiovascular:     Rate and Rhythm: Tachycardia present.     Comments: Mild tachycardia Pulmonary:     Comments: Somewhat decreased breath sounds throughout with mild wheezes. Chest:     Chest wall: No tenderness.  Abdominal:     Tenderness: There is no abdominal tenderness.  Musculoskeletal:     Right lower leg: Edema present.     Left lower leg: Edema present.     Comments: Moderate pitting edema bilateral lower extremities.  Skin:    General: Skin is warm.  Neurological:     Mental Status: He is oriented to person, place, and time.     ED Results / Procedures / Treatments   Labs (all labs ordered are listed, but only abnormal results are displayed) Labs Reviewed  CBC WITH DIFFERENTIAL/PLATELET - Abnormal; Notable for the following components:      Result Value   WBC 11.2 (*)    RBC 4.05 (*)    Hemoglobin 12.4 (*)    MCV 104.7 (*)    MCHC 29.2 (*)    nRBC 0.3 (*)  Monocytes Absolute 1.6 (*)    Abs  Immature Granulocytes 0.08 (*)    All other components within normal limits  SARS CORONAVIRUS 2 (TAT 6-24 HRS)  COMPREHENSIVE METABOLIC PANEL    EKG EKG Interpretation  Date/Time:  Friday February 28 2020 22:05:36 EDT Ventricular Rate:  90 PR Interval:    QRS Duration: 94 QT Interval:  369 QTC Calculation: 452 R Axis:   76 Text Interpretation: Sinus rhythm Biatrial enlargement No significant change since last tracing Confirmed by Davonna Belling [phone]) on 02/28/2020 11:20:10 PM   Radiology DG Chest Portable 1 View  Result Date: 02/28/2020 CLINICAL DATA:  Shortness of breath EXAM: PORTABLE CHEST 1 VIEW COMPARISON:  06/26/2018.  Chest CT 01/20/2020 FINDINGS: Right central mass and right upper lobe adjacent airspace disease again noted, similar to recent CT. Mild cardiomegaly. Left lung clear. No effusions or acute bony abnormality. IMPRESSION: Central right lung mass with adjacent right upper lobe atelectasis/airspace disease similar to recent CT. Cardiomegaly. Electronically Signed   By: Rolm Baptise M.D.   On: 02/28/2020 22:54    Procedures Procedures (including critical care time)  Medications Ordered in ED Medications - No data to display  ED Course  I have reviewed the triage vital signs and the nursing notes.  Pertinent labs & imaging results that were available during my care of the patient were reviewed by me and considered in my medical decision making (see chart for details).    MDM Rules/Calculators/A&P                      Patient with shortness of breath.  Cough.  X-ray stable.  However white count mildly elevated.  More sputum production.  At times will even mildly desat in the ER down to the 80s but will come back up on his own.  With worsening respiratory status feels the patient would benefit from admission to the hospital.  Lab work pending.  Final Clinical Impression(s) / ED Diagnoses Final diagnoses:  Acute on chronic respiratory failure with hypoxia [facility])      Rx / DC Orders ED Discharge Orders    None       Davonna Belling, MD 02/29/20 [phone]

## 2020-02-28 NOTE — ED Triage Notes (Signed)
Hx of lung cancer, coughing up pink sputum, bronchoscopy past Tuesday to reevaluate for possible recurrence of cancer. 2L [place] at home, 78% sats when EMS arrived, got weak.

## 2020-02-28 NOTE — ED Notes (Signed)
Pt had chemo previously. Attempted iv x 2 in right hand, no other likely access found. Consulting iv team

## 2020-02-29 ENCOUNTER — Encounter (HOSPITAL_COMMUNITY): Payer: Self-pay | Admitting: Family Medicine

## 2020-02-29 DIAGNOSIS — Z8249 Family history of ischemic heart disease and other diseases of the circulatory system: Secondary | ICD-10-CM | POA: Diagnosis not present

## 2020-02-29 DIAGNOSIS — J441 Chronic obstructive pulmonary disease with (acute) exacerbation: Secondary | ICD-10-CM | POA: Diagnosis present

## 2020-02-29 DIAGNOSIS — M1990 Unspecified osteoarthritis, unspecified site: Secondary | ICD-10-CM | POA: Diagnosis present

## 2020-02-29 DIAGNOSIS — I2510 Atherosclerotic heart disease of native coronary artery without angina pectoris: Secondary | ICD-10-CM

## 2020-02-29 DIAGNOSIS — Z8261 Family history of arthritis: Secondary | ICD-10-CM | POA: Diagnosis not present

## 2020-02-29 DIAGNOSIS — J9621 Acute and chronic respiratory failure with hypoxia: Principal | ICD-10-CM

## 2020-02-29 DIAGNOSIS — I10 Essential (primary) hypertension: Secondary | ICD-10-CM | POA: Diagnosis present

## 2020-02-29 DIAGNOSIS — M109 Gout, unspecified: Secondary | ICD-10-CM | POA: Diagnosis present

## 2020-02-29 DIAGNOSIS — J9622 Acute and chronic respiratory failure with hypercapnia: Secondary | ICD-10-CM | POA: Diagnosis present

## 2020-02-29 DIAGNOSIS — C3411 Malignant neoplasm of upper lobe, right bronchus or lung: Secondary | ICD-10-CM | POA: Diagnosis present

## 2020-02-29 DIAGNOSIS — Z87891 Personal history of nicotine dependence: Secondary | ICD-10-CM | POA: Diagnosis not present

## 2020-02-29 DIAGNOSIS — Z7902 Long term (current) use of antithrombotics/antiplatelets: Secondary | ICD-10-CM | POA: Diagnosis not present

## 2020-02-29 DIAGNOSIS — Z7982 Long term (current) use of aspirin: Secondary | ICD-10-CM | POA: Diagnosis not present

## 2020-02-29 DIAGNOSIS — C3432 Malignant neoplasm of lower lobe, left bronchus or lung: Secondary | ICD-10-CM | POA: Diagnosis present

## 2020-02-29 DIAGNOSIS — Z8711 Personal history of peptic ulcer disease: Secondary | ICD-10-CM | POA: Diagnosis not present

## 2020-02-29 DIAGNOSIS — Z8701 Personal history of pneumonia (recurrent): Secondary | ICD-10-CM | POA: Diagnosis not present

## 2020-02-29 DIAGNOSIS — Z955 Presence of coronary angioplasty implant and graft: Secondary | ICD-10-CM | POA: Diagnosis not present

## 2020-02-29 DIAGNOSIS — Z79899 Other long term (current) drug therapy: Secondary | ICD-10-CM | POA: Diagnosis not present

## 2020-02-29 DIAGNOSIS — E785 Hyperlipidemia, unspecified: Secondary | ICD-10-CM | POA: Diagnosis present

## 2020-02-29 DIAGNOSIS — Z20822 Contact with and (suspected) exposure to covid-19: Secondary | ICD-10-CM | POA: Diagnosis present

## 2020-02-29 LAB — CBC WITH DIFFERENTIAL/PLATELET
Abs Immature Granulocytes: 0.08 10*3/uL — ABNORMAL HIGH (ref 0.00–0.07)
Abs Immature Granulocytes: 0.08 10*3/uL — ABNORMAL HIGH (ref 0.00–0.07)
Basophils Absolute: 0.1 10*3/uL (ref 0.0–0.1)
Basophils Absolute: 0.1 10*3/uL (ref 0.0–0.1)
Basophils Relative: 1 %
Basophils Relative: 1 %
Eosinophils Absolute: 0.1 10*3/uL (ref 0.0–0.5)
Eosinophils Absolute: 0.1 10*3/uL (ref 0.0–0.5)
Eosinophils Relative: 1 %
Eosinophils Relative: 1 %
HCT: 41.9 % (ref 39.0–52.0)
HCT: 42.4 % (ref 39.0–52.0)
Hemoglobin: 12.1 g/dL — ABNORMAL LOW (ref 13.0–17.0)
Hemoglobin: 12.4 g/dL — ABNORMAL LOW (ref 13.0–17.0)
Immature Granulocytes: 1 %
Immature Granulocytes: 1 %
Lymphocytes Relative: 14 %
Lymphocytes Relative: 14 %
Lymphs Abs: 1.4 10*3/uL (ref 0.7–4.0)
Lymphs Abs: 1.6 10*3/uL (ref 0.7–4.0)
MCH: 30.1 pg (ref 26.0–34.0)
MCH: 30.6 pg (ref 26.0–34.0)
MCHC: 28.9 g/dL — ABNORMAL LOW (ref 30.0–36.0)
MCHC: 29.2 g/dL — ABNORMAL LOW (ref 30.0–36.0)
MCV: 104.2 fL — ABNORMAL HIGH (ref 80.0–100.0)
MCV: 104.7 fL — ABNORMAL HIGH (ref 80.0–100.0)
Monocytes Absolute: 0.9 10*3/uL (ref 0.1–1.0)
Monocytes Absolute: 1.6 10*3/uL — ABNORMAL HIGH (ref 0.1–1.0)
Monocytes Relative: 15 %
Monocytes Relative: 9 %
Neutro Abs: 7.3 10*3/uL (ref 1.7–7.7)
Neutro Abs: 7.7 10*3/uL (ref 1.7–7.7)
Neutrophils Relative %: 68 %
Neutrophils Relative %: 74 %
Platelets: 304 10*3/uL (ref 150–400)
Platelets: 307 10*3/uL (ref 150–400)
RBC: 4.02 MIL/uL — ABNORMAL LOW (ref 4.22–5.81)
RBC: 4.05 MIL/uL — ABNORMAL LOW (ref 4.22–5.81)
RDW: 15.1 % (ref 11.5–15.5)
RDW: 15.2 % (ref 11.5–15.5)
WBC: 11.2 10*3/uL — ABNORMAL HIGH (ref 4.0–10.5)
WBC: 9.8 10*3/uL (ref 4.0–10.5)
nRBC: 0.2 % (ref 0.0–0.2)
nRBC: 0.3 % — ABNORMAL HIGH (ref 0.0–0.2)

## 2020-02-29 LAB — COMPREHENSIVE METABOLIC PANEL
ALT: 26 U/L (ref 0–44)
AST: 29 U/L (ref 15–41)
Albumin: 3.4 g/dL — ABNORMAL LOW (ref 3.5–5.0)
Alkaline Phosphatase: 104 U/L (ref 38–126)
Anion gap: 7 (ref 5–15)
BUN: 11 mg/dL (ref 8–23)
CO2: 38 mmol/L — ABNORMAL HIGH (ref 22–32)
Calcium: 8.6 mg/dL — ABNORMAL LOW (ref 8.9–10.3)
Chloride: 91 mmol/L — ABNORMAL LOW (ref 98–111)
Creatinine, Ser: 0.8 mg/dL (ref 0.61–1.24)
GFR calc Af Amer: >60 mL/min (ref >60)
GFR calc non Af Amer: >60 mL/min (ref >60)
Glucose, Bld: 120 mg/dL — ABNORMAL HIGH (ref 70–99)
Potassium: 5 mmol/L (ref 3.5–5.1)
Sodium: 136 mmol/L (ref 135–145)
Total Bilirubin: 0.6 mg/dL (ref 0.3–1.2)
Total Protein: 8.3 g/dL — ABNORMAL HIGH (ref 6.5–8.1)

## 2020-02-29 LAB — SARS CORONAVIRUS 2 (TAT 6-24 HRS): SARS Coronavirus 2: NEGATIVE

## 2020-02-29 LAB — EXPECTORATED SPUTUM ASSESSMENT W GRAM STAIN, RFLX TO RESP C

## 2020-02-29 MED ORDER — ONDANSETRON HCL 4 MG PO TABS: 4.0000 mg | ORAL_TABLET | Freq: Four times a day (QID) | ORAL | Status: DC | PRN

## 2020-02-29 MED ORDER — BRIMONIDINE TARTRATE 0.15 % OP SOLN
1.0000 [drp] | Freq: Every day | OPHTHALMIC | Status: DC
  Administered 2020-02-29: 1 [drp] via OPHTHALMIC
  Filled 2020-02-29: qty 5

## 2020-02-29 MED ORDER — ALBUTEROL SULFATE (2.5 MG/3ML) 0.083% IN NEBU: 2.5000 mg | INHALATION_SOLUTION | Freq: Four times a day (QID) | RESPIRATORY_TRACT | Status: DC | PRN

## 2020-02-29 MED ORDER — SODIUM CHLORIDE 0.9 % IV SOLN
500.0000 mg | INTRAVENOUS | Status: DC
  Administered 2020-02-29: 500 mg via INTRAVENOUS
  Filled 2020-02-29 (×3): qty 500

## 2020-02-29 MED ORDER — ALBUTEROL SULFATE HFA 108 (90 BASE) MCG/ACT IN AERS
2.0000 | INHALATION_SPRAY | Freq: Four times a day (QID) | RESPIRATORY_TRACT | Status: DC | PRN
  Filled 2020-02-29: qty 6.7

## 2020-02-29 MED ORDER — CLOPIDOGREL BISULFATE 75 MG PO TABS
75.0000 mg | ORAL_TABLET | Freq: Every day | ORAL | Status: DC
  Administered 2020-02-29 – 2020-03-01 (×2): 75 mg via ORAL
  Filled 2020-02-29 (×2): qty 1

## 2020-02-29 MED ORDER — HYDROCODONE-HOMATROPINE 5-1.5 MG/5ML PO SYRP: 5.0000 mL | ORAL_SOLUTION | Freq: Four times a day (QID) | ORAL | Status: DC | PRN

## 2020-02-29 MED ORDER — SPIRONOLACTONE 25 MG PO TABS
25.0000 mg | ORAL_TABLET | Freq: Every day | ORAL | Status: DC
  Administered 2020-02-29 – 2020-03-01 (×2): 25 mg via ORAL
  Filled 2020-02-29 (×2): qty 1

## 2020-02-29 MED ORDER — UMECLIDINIUM BROMIDE 62.5 MCG/INH IN AEPB
1.0000 | INHALATION_SPRAY | Freq: Every day | RESPIRATORY_TRACT | Status: DC
  Filled 2020-02-29: qty 7

## 2020-02-29 MED ORDER — SODIUM CHLORIDE 0.9 % IV SOLN: 250.0000 mL | INTRAVENOUS | Status: DC | PRN

## 2020-02-29 MED ORDER — HYDROCODONE-ACETAMINOPHEN 5-325 MG PO TABS: 1.0000 | ORAL_TABLET | Freq: Four times a day (QID) | ORAL | Status: DC | PRN

## 2020-02-29 MED ORDER — ACETAMINOPHEN 650 MG RE SUPP: 650.0000 mg | Freq: Four times a day (QID) | RECTAL | Status: DC | PRN

## 2020-02-29 MED ORDER — FLUTICASONE FUROATE-VILANTEROL 200-25 MCG/INH IN AEPB
1.0000 | INHALATION_SPRAY | Freq: Every day | RESPIRATORY_TRACT | Status: DC
  Administered 2020-02-29: 1 via RESPIRATORY_TRACT
  Filled 2020-02-29: qty 28

## 2020-02-29 MED ORDER — SODIUM CHLORIDE 0.9% FLUSH
3.0000 mL | Freq: Two times a day (BID) | INTRAVENOUS | Status: DC
  Administered 2020-02-29 (×2): 3 mL via INTRAVENOUS

## 2020-02-29 MED ORDER — PANTOPRAZOLE SODIUM 40 MG PO TBEC
40.0000 mg | DELAYED_RELEASE_TABLET | Freq: Every day | ORAL | Status: DC
  Administered 2020-02-29 – 2020-03-01 (×2): 40 mg via ORAL
  Filled 2020-02-29 (×2): qty 1

## 2020-02-29 MED ORDER — ONDANSETRON HCL 4 MG/2ML IJ SOLN: 4.0000 mg | Freq: Four times a day (QID) | INTRAMUSCULAR | Status: DC | PRN

## 2020-02-29 MED ORDER — ATORVASTATIN CALCIUM 40 MG PO TABS
80.0000 mg | ORAL_TABLET | Freq: Every day | ORAL | Status: DC
  Administered 2020-02-29: 80 mg via ORAL
  Filled 2020-02-29: qty 1
  Filled 2020-02-29: qty 2

## 2020-02-29 MED ORDER — SODIUM CHLORIDE 0.9% FLUSH: 3.0000 mL | INTRAVENOUS | Status: DC | PRN

## 2020-02-29 MED ORDER — ALLOPURINOL 100 MG PO TABS
100.0000 mg | ORAL_TABLET | Freq: Every day | ORAL | Status: DC
  Administered 2020-02-29 – 2020-03-01 (×2): 100 mg via ORAL
  Filled 2020-02-29 (×2): qty 1

## 2020-02-29 MED ORDER — METHYLPREDNISOLONE SODIUM SUCC 40 MG IJ SOLR
40.0000 mg | Freq: Three times a day (TID) | INTRAMUSCULAR | Status: DC
  Administered 2020-02-29 (×2): 40 mg via INTRAVENOUS
  Filled 2020-02-29 (×3): qty 1

## 2020-02-29 MED ORDER — FLUTICASONE-UMECLIDIN-VILANT 100-62.5-25 MCG/INH IN AEPB: 1.0000 | INHALATION_SPRAY | Freq: Every day | RESPIRATORY_TRACT | Status: DC

## 2020-02-29 MED ORDER — METOPROLOL SUCCINATE ER 25 MG PO TB24
25.0000 mg | ORAL_TABLET | Freq: Every day | ORAL | Status: DC
  Administered 2020-02-29 – 2020-03-01 (×2): 25 mg via ORAL
  Filled 2020-02-29 (×2): qty 1

## 2020-02-29 MED ORDER — ENOXAPARIN SODIUM 40 MG/0.4ML ~~LOC~~ SOLN
40.0000 mg | Freq: Every day | SUBCUTANEOUS | Status: DC
  Administered 2020-02-29 – 2020-03-01 (×2): 40 mg via SUBCUTANEOUS
  Filled 2020-02-29 (×2): qty 0.4

## 2020-02-29 MED ORDER — ASPIRIN 81 MG PO CHEW
81.0000 mg | CHEWABLE_TABLET | Freq: Every day | ORAL | Status: DC
  Administered 2020-02-29: 81 mg via ORAL
  Filled 2020-02-29: qty 1

## 2020-02-29 MED ORDER — POLYETHYLENE GLYCOL 3350 17 G PO PACK: 17.0000 g | PACK | Freq: Every day | ORAL | Status: DC | PRN

## 2020-02-29 MED ORDER — DOCUSATE SODIUM 100 MG PO CAPS
100.0000 mg | ORAL_CAPSULE | Freq: Every day | ORAL | Status: DC
  Administered 2020-02-29 – 2020-03-01 (×2): 100 mg via ORAL
  Filled 2020-02-29 (×2): qty 1

## 2020-02-29 MED ORDER — ACETAMINOPHEN 325 MG PO TABS: 650.0000 mg | ORAL_TABLET | Freq: Four times a day (QID) | ORAL | Status: DC | PRN

## 2020-02-29 NOTE — ED Notes (Signed)
Attempted to get blood off line  Pt refused.  Attempted to give meds pt refuses to allow me to scan arm for meds.

## 2020-02-29 NOTE — ED Notes (Signed)
Pt O2 sat's ranging from 80-89% on 2L O2.  Pt remains asleep, mouth breathing. When awake, pt O2 sats high 80s-90% on 2L O2.

## 2020-02-29 NOTE — Progress Notes (Signed)
Occupational Therapy Evaluation Patient Details Name: Ryan Contreras MRN: [id number] DOB: July 31, 1941 Today's Date: 02/29/2020    History of Present Illness patient presented secondary to dyspnea, fatigue and cough and found to have a COPD exacerbation   Clinical Impression   Pt reports living at home with daughter and performing all self-care with Modified [place] using walker and home O2. Overall pt requires Modified [place] to Minimal assist with self-care tasks and Minimal assist with functional mobility. Pt presents in B UE jerky movements noted only in standing and mobilizing to top of bed. O2 stats dropped to mid 80s on 2.5L of O2. Pt reports double vision periodically through the day. Notified nursing staff of pt's concern. BP in standing increased to 178/97 with the original reading at 145/96. Nursing aware of BP fluctuation. Pt will benefit from continued skilled acute OT services to decrease level of assist with self-care tasks upon d/c.    Follow Up Recommendations  Home health OT    Equipment Recommendations  Tub/shower bench    Recommendations for Other Services       Precautions / Restrictions Precautions Precautions: Fall Restrictions Weight Bearing Restrictions: No      Mobility Bed Mobility Overal bed mobility: Needs Assistance Bed Mobility: Sidelying to Sit   Sidelying to sit: Min assist          Transfers Overall transfer level: Needs assistance Equipment used: 1 person hand held assist Transfers: Sit to/from Stand Sit to Stand: Min assist         General transfer comment: pt reports his legs feel weak in static standing    Balance Overall balance assessment: Mild deficits observed, not formally tested                                         ADL either performed or assessed with clinical judgement   ADL Overall ADL's : Needs assistance/impaired Eating/Feeding: Modified independent   Grooming: Set up   Upper  Body Bathing: Set up   Lower Body Bathing: Minimal assistance   Upper Body Dressing : Set up   Lower Body Dressing: Moderate assistance   Toilet Transfer: Minimal assistance   Toileting- Clothing Manipulation and Hygiene: Minimal assistance   Tub/ Shower Transfer: Minimal assistance   Functional mobility during ADLs: Minimal assistance;Cueing for safety       Vision Baseline Vision/History: No visual deficits       Perception     Praxis      Pertinent Vitals/Pain Pain Assessment: No/denies pain     Hand Dominance Left   Extremity/Trunk Assessment Upper Extremity Assessment Upper Extremity Assessment: Overall WFL for tasks assessed   Lower Extremity Assessment Lower Extremity Assessment: Defer to PT evaluation       Communication Communication Communication: No difficulties   Cognition Arousal/Alertness: Awake/alert Behavior During Therapy: WFL for tasks assessed/performed Overall Cognitive Status: Within Functional Limits for tasks assessed                                     General Comments       Exercises     Shoulder Instructions      Home Living Family/patient expects to be discharged to:: Private residence Living Arrangements: Other relatives   Type of Home: House Home Access: Stairs to enter [employer] of Steps:  4 Entrance Stairs-Rails: Can reach both Home Layout: One level     Bathroom Shower/Tub: Tub/shower unit     Bathroom Accessibility: Yes   Home Equipment: Grab bars - tub/shower;Walker - 2 wheels;Other (comment)(oxygen at 2L)   Additional Comments: report experiencing a few falls at home, daughter mobilizes with a walker and O2 in home       Prior Functioning/Environment Level of Independence: Independent with assistive device(s)                 OT Problem List: Decreased strength;Decreased activity tolerance;Impaired balance (sitting and/or standing);Decreased safety awareness;Decreased  knowledge of use of DME or AE;Cardiopulmonary status limiting activity;Decreased coordination      OT Treatment/Interventions: Self-care/ADL training;Therapeutic exercise;DME and/or AE instruction;Therapeutic activities;Energy conservation;Patient/family education;Balance training    OT Goals(Current goals can be found in the care plan section) Acute Rehab OT Goals Patient Stated Goal: to be able to take care of self OT Goal Formulation: With patient Time For Goal Achievement: 03/14/20 Potential to Achieve Goals: Good  OT Frequency: Min 2X/week   Barriers to D/C:            Co-evaluation              AM-PAC OT "6 Clicks" Daily Activity     Outcome Measure Help from another person eating meals?: None Help from another person taking care of personal grooming?: A Little Help from another person toileting, which includes using toliet, bedpan, or urinal?: A Little Help from another person bathing (including washing, rinsing, drying)?: A Little Help from another person to put on and taking off regular upper body clothing?: A Little Help from another person to put on and taking off regular lower body clothing?: A Lot 6 Click Score: 18   End of Session Equipment Utilized During Treatment: Oxygen Nurse Communication: Mobility status  Activity Tolerance: Patient tolerated treatment well Patient left: in bed;with call bell/phone within reach  OT Visit Diagnosis: Unsteadiness on feet (R26.81)                Time: 1219-7588 OT Time Calculation (min): 23 min Charges:  OT General Charges $OT Visit: 1 Visit OT Evaluation $OT Eval Moderate Complexity: 1 Mod OT Treatments $Self Care/Home Management : 8-22 mins  Riki Gehring OTR/L   Latoyna Hird 02/29/2020, 3:31 PM

## 2020-02-29 NOTE — ED Notes (Signed)
Pt weaned down to 2L O2 by [place], pt asleep at this time.  O2 sats 90%, good waveform noted.

## 2020-02-29 NOTE — ED Notes (Signed)
Admitting MD in with pt at present time

## 2020-02-29 NOTE — ED Notes (Signed)
Spoke with dr.Opyd  Told pt 02 @ 5 liters increased from 2L  Told pt angry and refuses blood work and meds at this time again.  Pt states he wants to go home unhappy he is in the Ed this long he states.

## 2020-02-29 NOTE — ED Notes (Signed)
Pt refuses to stay in bed wants to walk around,  Pt sitting on side of bed w Rn

## 2020-02-29 NOTE — ED Notes (Addendum)
Pt refuses to allow blood collect. Pt wants to talk to doctor says he wants to go home. Pulled off monitor cords.

## 2020-02-29 NOTE — Progress Notes (Signed)
Patient seen and examined at bedside, patient admitted after midnight, please see earlier detailed admission note by Vianne Bulls, MD. Briefly, patient presented secondary to dyspnea, fatigue and cough and found to have a COPD exacerbation. Started on steroids and duonebs in addition to azithromycin. Patient is improved this morning but not at baseline. Will consult PT/OT. Patient lives at home with a family member and states he ambulates unassisted. On exam, he looks comfortable, poor dentition noted. Decreased breath sounds but normal respiratory effort.    Cordelia Poche, MD [facility] 02/29/2020, 3:11 PM

## 2020-02-29 NOTE — ED Notes (Signed)
Pt provided lunch tray.

## 2020-02-29 NOTE — H&P (Signed)
History and Physical    Ryan Contreras QVZ:[id number] DOB: 1941/08/27 DOA: 02/28/2020  PCP: Cyndi Bender, PA-C   Patient coming from: Home   Chief Complaint: Increased cough, SOB, and fatigue   HPI: Ryan Contreras is a 79 y.o. male with medical history significant for coronary artery disease, COPD, hypertension, lung cancer, and recent PET scanning and bronchoscopy consistent with recurrent lung cancer, now presenting to the emergency department with a week of increase in his chronic cough, increased sputum production, and worsening shortness of breath.  Patient reports that he developed increased cough productive of purulent and blood-tinged sputum approximately a week ago, has had increased fatigue since that time, and also has been increasingly short of breath.  He has not noted any change in his chronic leg swelling and denies any leg tenderness.  Denies any chest pain.  Denies any fevers or chills.  ED Course: Upon arrival to the ED, patient is found to be afebrile, saturating low 80s on 2 L/min of supplemental oxygen, mildly tachypneic, and with stable blood pressure.  EKG features a sinus rhythm and chest x-ray with central lung mass and adjacent atelectasis or airspace disease.  Chemistry panel notable for bicarbonate of 38 and CBC with mild leukocytosis and slight macrocytic anemia.  COVID-19 PCR is pending.  Hospitalists asked to admit.  Review of Systems:  All other systems reviewed and apart from HPI, are negative.  Past Medical History:  Diagnosis Date  . Arthritis   . CAD, NATIVE VESSEL    coronary stent x1-tx. Plavix use.  Marland Kitchen COPD (chronic obstructive pulmonary disease) ([place])    due to 50+ years Smoking.  Marland Kitchen Dyspnea    wears oxygen PRN  . Gastrointestinal bleeding, upper 04/29/2016  . Gout, unspecified    foot  . HYPERLIPIDEMIA   . HYPERTENSION   . Lung mass   . NSCL ca dx'd 12/2015   Lung  . On supplemental oxygen by nasal cannula    PRN  . PEPTIC ULCER DISEASE   .  Pneumonia   . TOBACCO ABUSE   . Type 2 diabetes mellitus ([place])    denies on 01-06-16    Past Surgical History:  Procedure Laterality Date  . BRONCHIAL BIOPSY  02/25/2020   Procedure: BRONCHIAL BIOPSIES;  Surgeon: Collene Gobble, MD;  Location: [facility] ENDOSCOPY;  Service: Pulmonary;;  . BRONCHIAL BRUSHINGS  02/25/2020   Procedure: BRONCHIAL BRUSHINGS;  Surgeon: Collene Gobble, MD;  Location: [facility] ENDOSCOPY;  Service: Pulmonary;;  . CARDIAC CATHETERIZATION     '06-stent placed, 6'13 - Dr. Lizbeth Bark follows  . COLONOSCOPY WITH PROPOFOL N/A 09/24/2019   Procedure: COLONOSCOPY WITH PROPOFOL;  Surgeon: Irene Shipper, MD;  Location: [facility] ENDOSCOPY;  Service: Endoscopy;  Laterality: N/A;  . CORONARY STENT PLACEMENT     s/p  . ENDOBRONCHIAL ULTRASOUND Bilateral 01/11/2016   Procedure: ENDOBRONCHIAL ULTRASOUND;  Surgeon: Collene Gobble, MD;  Location: [facility] ENDOSCOPY;  Service: Endoscopy;  Laterality: Bilateral;  . ESOPHAGOGASTRODUODENOSCOPY (EGD) WITH PROPOFOL N/A 09/24/2019   Procedure: ESOPHAGOGASTRODUODENOSCOPY (EGD) WITH PROPOFOL;  Surgeon: Irene Shipper, MD;  Location: [facility] ENDOSCOPY;  Service: Endoscopy;  Laterality: N/A;  . POLYPECTOMY  09/24/2019   Procedure: POLYPECTOMY;  Surgeon: Irene Shipper, MD;  Location: [facility] ENDOSCOPY;  Service: Endoscopy;;  . VIDEO BRONCHOSCOPY  02/25/2020   Procedure: VIDEO BRONCHOSCOPY WITHOUT FLUORO;  Surgeon: Collene Gobble, MD;  Location: [facility] ENDOSCOPY;  Service: Pulmonary;;  . VIDEO BRONCHOSCOPY WITH ENDOBRONCHIAL NAVIGATION Right 03/07/2018   Procedure:  VIDEO BRONCHOSCOPY WITH ENDOBRONCHIAL NAVIGATION;  Surgeon: Collene Gobble, MD;  Location: [place];  Service: Thoracic;  Laterality: Right;     reports that he quit smoking about 2 years ago. His smoking use included cigarettes. He has a 25.00 pack-year smoking history. He has never used smokeless tobacco. He reports previous alcohol use. He reports that he does not use drugs.  No Known Allergies  Family History  Problem  Relation Age of Onset  . Breast cancer Sister   . Cancer Brother        Uncertain type  . Arthritis Mother   . Hypertension Father   . Heart attack Father   . Colon cancer Neg Hx   . Liver cancer Neg Hx   . Stomach cancer Neg Hx      Prior to Admission medications   Medication Sig Start Date End Date Taking? Authorizing Provider  acetaminophen (TYLENOL) 500 MG tablet Take 250 mg by mouth every 6 (six) hours as needed for moderate pain or headache.    Yes [provider]  allopurinol (ZYLOPRIM) 100 MG tablet TAKE 1 TABLET BY MOUTH EVERY DAY Patient taking differently: Take 100 mg by mouth daily.  01/07/19  Yes Renato Shin, MD  aspirin 81 MG tablet Take 1 tablet (81 mg total) by mouth at bedtime. Restart on 03/09/18 03/07/18  Yes Collene Gobble, MD  atorvastatin (LIPITOR) 80 MG tablet TAKE 1 TABLET BY MOUTH EVERY DAY Patient taking differently: Take 80 mg by mouth daily.  11/01/19  Yes Fay Records, MD  brimonidine (ALPHAGAN) 0.15 % ophthalmic solution Place 1 drop into both eyes in the morning and at bedtime.  01/15/20  Yes [provider]  clopidogrel (PLAVIX) 75 MG tablet Take 1 tablet (75 mg total) by mouth daily. Okay to restart this medication on 02/26/2020 02/25/20  Yes Collene Gobble, MD  docusate sodium (COLACE) 100 MG capsule Take 100 mg by mouth daily.    Yes [provider]  eplerenone (INSPRA) 25 MG tablet TAKE 1 TABLET BY MOUTH EVERY DAY Patient taking differently: Take 25 mg by mouth daily.  09/10/19  Yes Collene Gobble, MD  HYDROcodone-homatropine [facility]) 5-1.5 MG/5ML syrup Take 5 mLs by mouth every 6 (six) hours as needed for cough. 02/26/20  Yes Byrum, Rose Fillers, MD  KLOR-CON M20 20 MEQ tablet TAKE 1 TABLET BY MOUTH DAILY Patient taking differently: Take 20 mEq by mouth daily.  09/09/18  Yes Renato Shin, MD  metoprolol succinate (TOPROL-XL) 25 MG 24 hr tablet Take 25 mg by mouth daily.  08/05/19  Yes [provider]  OXYGEN Inhale 2 L  into the lungs at bedtime.   Yes [provider]  pantoprazole (PROTONIX) 40 MG tablet TAKE 40 MG BY MOUTH EVERY DAY Patient taking differently: Take 40 mg by mouth daily.  03/07/18  Yes Collene Gobble, MD  polyethylene glycol (MIRALAX / GLYCOLAX) 17 g packet Take 17 g by mouth daily as needed for mild constipation or moderate constipation.   Yes [provider]  PROAIR HFA 108 (90 Base) MCG/ACT inhaler Inhale 2 puffs into the lungs every 6 (six) hours as needed for wheezing or shortness of breath. 02/17/18  Yes [provider]  sildenafil (REVATIO) 20 MG tablet Take 100 mg by mouth daily as needed for erectile dysfunction. 09/02/19  Yes [provider]  TRELEGY ELLIPTA 100-62.5-25 MCG/INH AEPB Inhale 1 puff into the lungs daily. 09/17/19  Yes Collene Gobble, MD  Na Sulfate-K Sulfate-Mg Sulf 17.5-3.13-1.6 GM/177ML SOLN Suprep-Use as directed Patient not taking: Reported on 02/29/2020 09/23/19   Irene Shipper, MD    Physical Exam: Vitals:   02/28/20 2213 02/28/20 2230 02/28/20 2330 02/29/20 0130  BP:  (!) 162/88 (!) 166/98 (!) 133/92  Pulse:  93 100 (!) 110  Resp:  16  19  Temp:      TempSrc:      SpO2:  90% (!) 83% (!) 85%  Weight: 104.3 kg     Height: 6\' 1"  (1.854 m)       Constitutional: Mild tachypnea, no acute distress, calm  Eyes: PERTLA, lids and conjunctivae normal ENMT: Mucous membranes are moist. Posterior pharynx clear of any exudate or lesions.   Neck: normal, supple, no masses, no thyromegaly Respiratory: Rhonchi on right, diffuse wheezes. No pallor or cyanosis.  Cardiovascular: S1 & S2 heard, regular rate and rhythm. Pretibial pitting edema bilaterally.    Abdomen: No distension, no tenderness, soft. Bowel sounds active.  Musculoskeletal: no clubbing / cyanosis. No joint deformity upper and lower extremities.   Skin: no significant rashes, lesions, ulcers. Warm, dry, well-perfused. Neurologic: No gross facial asymmetry. Sensation intact.  Moving all extremities.  Psychiatric: Alert and oriented, appropriate throughout interview and exam. Very pleasant and cooperative.    Labs and Imaging on Admission: I have personally reviewed following labs and imaging studies  CBC: Recent Labs  Lab 02/28/20 2332  WBC 11.2*  NEUTROABS 7.7  HGB 12.4*  HCT 42.4  MCV 104.7*  PLT 562   Basic Metabolic Panel: Recent Labs  Lab 02/28/20 2332  NA 136  K 5.0  CL 91*  CO2 38*  GLUCOSE 120*  BUN 11  CREATININE 0.80  CALCIUM 8.6*   GFR: Estimated Creatinine Clearance: 99.7 mL/min (by C-G formula based on SCr of 0.8 mg/dL). Liver Function Tests: Recent Labs  Lab 02/28/20 2332  AST 29  ALT 26  ALKPHOS 104  BILITOT 0.6  PROT 8.3*  ALBUMIN 3.4*   No results for input(s): LIPASE, AMYLASE in the last 168 hours. No results for input(s): AMMONIA in the last 168 hours. Coagulation Profile: No results for input(s): INR, PROTIME in the last 168 hours. Cardiac Enzymes: No results for input(s): CKTOTAL, CKMB, CKMBINDEX, TROPONINI in the last 168 hours. BNP (last 3 results) No results for input(s): PROBNP in the last 8760 hours. HbA1C: No results for input(s): HGBA1C in the last 72 hours. CBG: Recent Labs  Lab 02/25/20 1302  GLUCAP 113*   Lipid Profile: No results for input(s): CHOL, HDL, LDLCALC, TRIG, CHOLHDL, LDLDIRECT in the last 72 hours. Thyroid Function Tests: No results for input(s): TSH, T4TOTAL, FREET4, T3FREE, THYROIDAB in the last 72 hours. Anemia Panel: No results for input(s): VITAMINB12, FOLATE, FERRITIN, TIBC, IRON, RETICCTPCT in the last 72 hours. Urine analysis:    Component Value Date/Time   COLORURINE YELLOW 12/27/2017 [place] 12/27/2017 0844   LABSPEC 1.015 12/27/2017 0844   PHURINE 6.0 12/27/2017 0844   GLUCOSEU NEGATIVE 12/27/2017 0844   HGBUR NEGATIVE 12/27/2017 0844   BILIRUBINUR NEGATIVE 12/27/2017 0844   KETONESUR NEGATIVE 12/27/2017 0844   UROBILINOGEN 0.2 12/27/2017  0844   NITRITE NEGATIVE 12/27/2017 0844   LEUKOCYTESUR NEGATIVE 12/27/2017 0844   Sepsis Labs: @LABRCNTIP (procalcitonin:4,lacticidven:4) ) Recent Results (from the past 240 hour(s))  SARS CORONAVIRUS 2 (TAT 6-24 HRS) Nasopharyngeal Nasopharyngeal Swab     Status: None   Collection Time: 02/22/20  2:10 PM   Specimen: Nasopharyngeal Swab  Result Value Ref Range Status   SARS Coronavirus 2 NEGATIVE NEGATIVE Final    Comment: (NOTE) SARS-CoV-2 target nucleic acids are NOT DETECTED. The SARS-CoV-2 RNA is generally detectable in upper and lower respiratory specimens during the acute phase of infection. Negative results do not preclude SARS-CoV-2 infection, do not rule out co-infections with other pathogens, and should not be used as the sole basis for treatment or other patient management decisions. Negative results must be combined with clinical observations, patient history, and epidemiological information. The expected result is Negative. Fact Sheet for Patients: SugarRoll.be Fact Sheet for Healthcare Providers: https://www.woods-mathews.com/ This test is not yet approved or cleared by the [place] FDA and  has been authorized for detection and/or diagnosis of SARS-CoV-2 by FDA under an Emergency Use Authorization (EUA). This EUA will remain  in effect (meaning this test can be used) for the duration of the COVID-19 declaration under Section 56 4(b)(1) of the Act, 21 U.S.C. section 360bbb-3(b)(1), unless the authorization is terminated or revoked sooner. Performed at [facility], [place] [address] [place], [place] [address]      Radiological Exams on Admission: DG Chest Portable 1 View  Result Date: 02/28/2020 CLINICAL DATA:  Shortness of breath EXAM: PORTABLE CHEST 1 VIEW COMPARISON:  06/26/2018.  Chest CT 01/20/2020 FINDINGS: Right central mass and right upper lobe adjacent airspace disease again noted, similar to recent CT.  Mild cardiomegaly. Left lung clear. No effusions or acute bony abnormality. IMPRESSION: Central right lung mass with adjacent right upper lobe atelectasis/airspace disease similar to recent CT. Cardiomegaly. Electronically Signed   By: Rolm Baptise M.D.   On: 02/28/2020 22:54    EKG: Independently reviewed. Sinus rhythm.   Assessment/Plan   1. COPD with acute exacerbation; acute on chronic hypoxic respiratory failure  - Presents with 1 week of increased cough with purulent and blood-tinged sputum, and at least two days of progressive SOB and fatigue  - He was found to be saturating in 70's on his usual 2 Lpm supplemental O2  - CXR with mass and adjacent atelectasis or airspace disease  - Diffuse wheezes present in ED  - Check sputum culture and start systemic steroid and azithromycin, continue Trelegy and albuterol, continue supplemental O2    2. Lung cancer  - Recent PET and bronchoscopy concerning for recurrence  - Scheduled to see his oncologist next week, will add his oncologist to Treatment Team as notification of patient's admission    3. CAD  - No anginal complaints  - Continue ASA, Plavix, statin, and beta-blocker    4. Hypertension  - Stable  - Continue Toprol and diuretic    DVT prophylaxis: Lovenox  Code Status: Full  Family Communication: Discussed with patient  Disposition Plan: Likely back home in 3-4 pending improvement in respiratory status  Consults called: None  Admission status: inpatient     Vianne Bulls, MD [facility] Pager: See [email or web address]  If 7AM-7PM, please contact the daytime attending [email or web address]  02/29/2020, 3:54 AM

## 2020-02-29 NOTE — ED Notes (Signed)
PT ON 3 L [place]

## 2020-02-29 NOTE — ED Notes (Signed)
Pt keeps pulling off leads and demands to go

## 2020-02-29 NOTE — ED Notes (Signed)
Attempted report x1, primary Designer, multimedia both unavailable to take report.

## 2020-03-01 DIAGNOSIS — C3432 Malignant neoplasm of lower lobe, left bronchus or lung: Secondary | ICD-10-CM

## 2020-03-01 LAB — BASIC METABOLIC PANEL
Anion gap: 10 (ref 5–15)
BUN: 15 mg/dL (ref 8–23)
CO2: 39 mmol/L — ABNORMAL HIGH (ref 22–32)
Calcium: 8.9 mg/dL (ref 8.9–10.3)
Chloride: 93 mmol/L — ABNORMAL LOW (ref 98–111)
Creatinine, Ser: 0.75 mg/dL (ref 0.61–1.24)
GFR calc Af Amer: >60 mL/min (ref >60)
GFR calc non Af Amer: >60 mL/min (ref >60)
Glucose, Bld: 107 mg/dL — ABNORMAL HIGH (ref 70–99)
Potassium: 4.5 mmol/L (ref 3.5–5.1)
Sodium: 142 mmol/L (ref 135–145)

## 2020-03-01 MED ORDER — METHYLPREDNISOLONE SODIUM SUCC 40 MG IJ SOLR
40.0000 mg | Freq: Every day | INTRAMUSCULAR | Status: DC
  Filled 2020-03-01: qty 1

## 2020-03-01 MED ORDER — PREDNISONE 10 MG PO TABS: ORAL_TABLET | ORAL | 0 refills | Status: AC

## 2020-03-01 MED ORDER — AZITHROMYCIN 250 MG PO TABS: 500.0000 mg | ORAL_TABLET | Freq: Every day | ORAL | 0 refills | Status: AC

## 2020-03-01 NOTE — TOC Initial Note (Signed)
Transition of Care [facility]) - Initial/Assessment Note    Patient Details  Name: Ryan Contreras MRN: [id number] Date of Birth: 05/07/1941  Transition of Care [facility]) CM/SW Contact:    Joaquin Courts, RN Phone Number: 03/01/2020, 3:47 PM  Clinical Narrative:     Patient set up for HHPT/OT/RN with [facility]. [employer] to deliver 3in1 to bedside, patient expresses that he does not want to wait for delivery and is in agreement to have the equipment shipped to home, the delivery may take up to 3 days.               Expected Discharge Plan: [place] Barriers to Discharge: No Barriers Identified   Patient Goals and CMS Choice Patient states their goals for this hospitalization and ongoing recovery are:: to go home CMS Medicare.gov Compare Post Acute Care list provided to:: Patient Choice offered to / list presented to : Patient  Expected Discharge Plan and Services Expected Discharge Plan: [place]   Discharge Planning Services: CM Consult Post Acute Care Choice: [place] arrangements for the past 2 months: Single Family Home Expected Discharge Date: 03/01/20               DME Arranged: 3-N-1 DME Agency: [employer] Date DME Agency Contacted: 03/01/20 Time DME Agency Contacted: [phone] Representative spoke with at DME Agency: [place] Arranged: PT, OT, RN [place] Agency: [place] Date [place]: 03/01/20 Time [place]: [place] Representative spoke with at [place]: Georgina Snell  Prior Living Arrangements/Services Living arrangements for the past 2 months: [place]   Patient language and need for interpreter reviewed:: Yes Do you feel safe going back to the place where you live?: Yes      Need for Family Participation in Patient Care: Yes (Comment) Care giver support system in place?: Yes (comment)   Criminal Activity/Legal Involvement Pertinent to Current Situation/Hospitalization: No - Comment as  needed  Activities of Daily Living Home Assistive Devices/Equipment: Gilford Rile (specify type) ADL Screening (condition at time of admission) Patient's cognitive ability adequate to safely complete daily activities?: Yes Is the patient deaf or have difficulty hearing?: No Does the patient have difficulty seeing, even when wearing glasses/contacts?: No Does the patient have difficulty concentrating, remembering, or making decisions?: Yes Patient able to express need for assistance with ADLs?: Yes Does the patient have difficulty dressing or bathing?: No Independently performs ADLs?: Yes (appropriate for developmental age) Does the patient have difficulty walking or climbing stairs?: Yes Weakness of Legs: Both Weakness of Arms/Hands: Both  Permission Sought/Granted                  Emotional Assessment   Attitude/Demeanor/Rapport: Engaged Affect (typically observed): Accepting     Psych Involvement: No (comment)  Admission diagnosis:  Acute on chronic respiratory failure with hypoxia (HCC) [J96.21] Acute on chronic respiratory failure with hypoxia and hypercapnia (HCC) [J19.14, J96.22] Patient Active Problem List   Diagnosis Date Noted  . Acute on chronic respiratory failure with hypoxia and hypercapnia ([place]) 02/29/2020  . COPD with acute exacerbation ([place]) 02/29/2020  . Chronic respiratory failure with hypoxia ([place]) 02/18/2020  . History of colonic polyps   . Benign neoplasm of transverse colon   . 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 Memory loss 06/26/2017  . Hypercapnic respiratory failure ([place]) 11/22/2016  . GI bleed 05/02/2016  . Anemia 05/02/2016  . Gastrointestinal bleeding, upper 04/29/2016  .  Encounter for antineoplastic chemotherapy 02/15/2016  . Prostatitis, chronic 01/23/2016  . Malignant neoplasm of right upper lobe of lung ([place]) 01/21/2016  . Hilar adenopathy 01/11/2016  . Enlargement of lymph nodes   . Other nonspecific  abnormal finding of lung field   . Pulmonary nodules 11/26/2015  . COPD  GOLD C 11/02/2015  . Cigarette smoker 11/02/2015  . Dyspnea 09/11/2015  . Edema 08/14/2015  . Diabetes ([place]) 04/13/2015  . Wart viral 01/30/2015  . Wellness examination 11/14/2014  . Neoplasm of uncertain behavior of skin 12/31/2012  . Bruise 07/21/2012  . Otitis media 04/19/2012  . Otitis externa 04/19/2012  . Cough 01/06/2012  . Encounter for long-term (current) use of other medications 12/16/2011  . PSA, INCREASED 08/11/2010  . SKIN RASH 07/14/2010  . TOBACCO ABUSE 03/15/2010  . CAD, NATIVE VESSEL 03/15/2010  . OTHER MALAISE AND FATIGUE 03/15/2010  . OTHER SPEC HYPERTROPHIC&ATROPHIC CONDITION SKIN 10/01/2009  . HYPOKALEMIA 06/03/2009  . Hyperlipidemia 05/14/2008  . Essential hypertension 05/14/2008  . Coronary atherosclerosis 05/14/2008  . PUD (peptic ulcer disease) 05/14/2008  . LEG PAIN 05/14/2008  . Gout 01/21/2008   PCP:  Cyndi Bender, PA-C Pharmacy:   [id number] - [place], [place] [address] Phone: [phone] Fax: [phone]     Social Determinants of Health (SDOH) Interventions    Readmission Risk Interventions No flowsheet data found.

## 2020-03-01 NOTE — Progress Notes (Signed)
Patient discharged to home with sisters. Discharge instructions reviewed with patient and sisters who verbalized understanding. Sisters transported patient home with his home Oxygen.

## 2020-03-01 NOTE — Discharge Instructions (Addendum)
Ryan Contreras,  You were in the hospital with a COPD exacerbation. You will be discharged on a steroid taper and azithromycin. Please continue your breathing treatments.

## 2020-03-01 NOTE — Discharge Summary (Signed)
Physician Discharge Summary  Ryan Contreras Record DZH:[id number] DOB: Jul 06, 1941 DOA: 02/28/2020  PCP: Cyndi Bender, PA-C  Admit date: 02/28/2020 Discharge date: 03/01/2020  Admitted From: Home Disposition: Home  Recommendations for Outpatient Follow-up:  1. Follow up with PCP in 1 week 2. Please follow up on the following pending results: Sputum culture  Home Health: PT, OT, RN Equipment/Devices: 3 in 1, Tub seat  Discharge Condition: Stable CODE STATUS: Full code Diet recommendation: Heart healthy   Brief/Interim Summary:  Admission HPI written by Vianne Bulls, MD   Chief Complaint: Increased cough, SOB, and fatigue   HPI: Ryan Contreras is a 79 y.o. male with medical history significant for coronary artery disease, COPD, hypertension, lung cancer, and recent PET scanning and bronchoscopy consistent with recurrent lung cancer, now presenting to the emergency department with a week of increase in his chronic cough, increased sputum production, and worsening shortness of breath.  Patient reports that he developed increased cough productive of purulent and blood-tinged sputum approximately a week ago, has had increased fatigue since that time, and also has been increasingly short of breath.  He has not noted any change in his chronic leg swelling and denies any leg tenderness.  Denies any chest pain.  Denies any fevers or chills.  ED Course: Upon arrival to the ED, patient is found to be afebrile, saturating low 80s on 2 L/min of supplemental oxygen, mildly tachypneic, and with stable blood pressure.  EKG features a sinus rhythm and chest x-ray with central lung mass and adjacent atelectasis or airspace disease.  Chemistry panel notable for bicarbonate of 38 and CBC with mild leukocytosis and slight macrocytic anemia.  COVID-19 PCR is pending.  Hospitalists asked to admit.   Hospital course:  COPD exacerbation COPD GOLD C Unknown precipitating factor. Patient is on Trelegy  Ellipta, albuterol. Managed on Solu-medrol and albuterol. Symptoms improved. Patient also started on azithromycin. Physical therapy and occupation therapy recommended HHPT/OT with tub seat and 3 in 1 commode. Continue azithromycin for three days and discharge with prednisone for taper on discharge.  Acute on chronic respiratory failure with hypoxia Secondary to above. Back to baseline. Ambulatory pulse oximetry 93% at lowest while on 2 L oxygen via nasal canula. No need for increased oxygen on discharge  Lung cancer Patient scheduled to follow-up with oncology, Dr. Julien Nordmann.  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   allopurinol 100 MG tablet Commonly known as: ZYLOPRIM TAKE 1 TABLET BY MOUTH EVERY DAY   aspirin 81 MG tablet Take 1 tablet (81 mg total) by mouth at bedtime. Restart on 03/09/18   atorvastatin 80 MG tablet Commonly known as: LIPITOR TAKE 1 TABLET BY MOUTH EVERY DAY   azithromycin 250 MG tablet Commonly known as: Zithromax Take 2 tablets (500 mg total) by mouth daily for 3 days.  Take 1 tablet daily for 3 days. Start taking on: March 02, 2020   brimonidine 0.15 % ophthalmic solution Commonly known as: ALPHAGAN Place 1 drop into both eyes in the morning and at bedtime.   clopidogrel 75 MG tablet Commonly known as: PLAVIX Take  1 tablet (75 mg total) by mouth daily. Okay to restart this medication on 02/26/2020   docusate sodium 100 MG capsule Commonly known as: COLACE Take 100 mg by mouth daily.   eplerenone 25 MG tablet Commonly known as: INSPRA TAKE 1 TABLET BY MOUTH EVERY DAY   HYDROcodone-homatropine 5-1.5 MG/5ML syrup Commonly known as: HYCODAN Take 5 mLs by mouth every 6 (six) hours as needed for cough.   Klor-Con M20 20 MEQ tablet Generic drug: potassium chloride SA TAKE 1 TABLET BY MOUTH DAILY What changed: how much to take   metoprolol succinate 25 MG 24 hr tablet Commonly known as: TOPROL-XL Take 25 mg by mouth daily.   Na Sulfate-K Sulfate-Mg Sulf 17.5-3.13-1.6 GM/177ML Soln Suprep-Use as directed   OXYGEN Inhale 2 L into the lungs at bedtime.   pantoprazole 40 MG tablet Commonly known as: PROTONIX TAKE 40 MG BY MOUTH EVERY DAY What changed:   how much to take  how to take this  when to take this  additional instructions   polyethylene glycol 17 g packet Commonly known as: MIRALAX / GLYCOLAX Take 17 g by mouth daily as needed for mild constipation or moderate constipation.   predniSONE 10 MG tablet Commonly known as: DELTASONE Take 4 tablets (40 mg total) by mouth daily with breakfast for 2 days, THEN 2 tablets (20 mg total) daily with breakfast for 2 days, THEN 1 tablet (10 mg total) daily with breakfast for 2 days. Start taking on: March 01, 2020   ProAir HFA 108 (90 Base) MCG/ACT inhaler Generic drug: albuterol Inhale 2 puffs into the lungs every 6 (six) hours as needed for wheezing or shortness of breath.   sildenafil 20 MG tablet Commonly known as: REVATIO Take 100 mg by mouth daily as needed for erectile dysfunction.   Trelegy Ellipta 100-62.5-25 MCG/INH Aepb Generic drug: Fluticasone-Umeclidin-Vilant Inhale 1 puff into the lungs daily.            Durable Medical Equipment  (From admission, onward)         Start     Ordered   03/01/20 1508  For home  use only DME Shower stool  Once     03/01/20 1508   03/01/20 1505  For home use only DME 3 n 1  Once     03/01/20 1508          No Known Allergies  Consultations:  None   Procedures/Studies: NM PET Image Restag (PS) Skull Base To Thigh  Result Date: 02/05/2020 CLINICAL DATA:  Subsequent treatment strategy for non-small cell lung cancer. EXAM: NUCLEAR MEDICINE PET SKULL BASE TO THIGH TECHNIQUE: 11.43 mCi F-18 FDG was injected intravenously. Full-ring PET imaging was performed from the skull base to thigh after the radiotracer. CT data was obtained and used for attenuation correction and anatomic localization. Fasting blood glucose: 92 mg/dl COMPARISON:  Previous evaluation dated 04/11/2018 FINDINGS: Mediastinal blood pool activity: SUV max 2.12 Liver activity: SUV max NA NECK: Symmetric tonsillar activity. 6 mm left level 2 lymph node (SUVmax = 3.67) (image 27, series 4) no nodes enlarged by size criteria in the neck Incidental CT findings: Calcified atherosclerotic changes in the carotid arteries. CHEST: Enlarging right upper lobe nodular area  of consolidative changes measuring 3.0 x 2.3 cm on today's study, subtle area on the study of 07/19/2019 and also described on the CT of 01/20/2020 (SUVmax = 19.45) Intense FDG uptake also in collapsed lung adjacent to this area (SUVmax = 7.86). This area is morphologically similar over a series of prior exams aside from the more nodular peripheral area. Thickening posterior to the right mainstem and bronchus intermedius (image 66, series 4) this measures 11 mm in greatest thickness as compared to 7 mm in August of 2020 (SUVmax = 17.71 Pleural thickening and small amount of pleural fluid without significant FDG uptake with the exception of pleural thickening in the medial right chest along the spine and posterior to the aorta (image 62, series 4) this measures approximately 8 mm greatest thickness and shows a similar appearance to the study of August of 2020  (SUVmax = 3.3) Mottled activity in the right pectoralis muscle with thickening of musculature and associated fracture of the right fourth rib anteriorly. No measurable lesion in this location (SUVmax = 3.07) Extension of soft tissue into the chest wall between ribs at this location and other areas bordering the consolidative process in the right upper chest showing similar FDG uptake to the area of adjacent disease recurrence and consolidation. Incidental CT findings: Findings as discussed on recent CT of the chest with rib fracture involving the right fourth rib anteriorly. And thickening of right pectoralis musculature along the chest wall. Signs of calcified atherosclerosis, no signs of aneurysm. Calcified coronary artery disease. ABDOMEN/PELVIS: Intense uptake in the peripheral prostate posteriorly (SUVmax = 8.16) No additional foci of increased FDG uptake. Incidental CT findings: Large right inguinal hernia containing the appendix and right lower quadrant small bowel loops. Signs of aortic atherosclerosis without signs of aneurysm. SKELETON: No focal hypermetabolic activity to suggest skeletal metastasis. Signs of rib fracture along the anterior right chest likely related to osteitis in the setting of prior radiation. Soft tissue changes are equivocal. Incidental CT findings: none IMPRESSION: 1. Intense hypermetabolic activity with enlarging lesion in the right upper lobe peripheral to consolidated lung and in peribronchial soft tissue in the central chest adjacent to bronchus intermedius. Findings are suspicious for disease recurrence. 2. Intense FDG uptake with some loss of fat planes in the intercostal musculature at the margin of collapsed lung, attention on follow-up. 3. Equivocal FDG uptake in the collapsed lung, less than the suspected areas of recurrence may relate primarily to post radiation changes. Disease involvement in this area however given above findings is considered. 4. Mildly FDG avid left  neck lymph node only slightly above blood pool, this may be reactive or neoplastic. 5. Prostate activity increased from the previous study but diminished compared to the study of January 2019. Prostate cancer or prostatitis could have this appearance. 6. 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 Surrounding soft tissue activity is also potentially radiation related. Attention on follow-up. Electronically Signed   By: Zetta Bills M.D.   On: 02/05/2020 10:35   DG Chest Portable 1 View  Result Date: 02/28/2020 CLINICAL DATA:  Shortness of breath EXAM: PORTABLE CHEST 1 VIEW COMPARISON:  06/26/2018.  Chest CT 01/20/2020 FINDINGS: Right central mass and right upper lobe adjacent airspace disease again noted, similar to recent CT. Mild cardiomegaly. Left lung clear. No effusions or acute bony abnormality. IMPRESSION: Central right lung mass with adjacent right upper lobe atelectasis/airspace disease similar to recent CT. Cardiomegaly. Electronically Signed   By: Rolm Baptise  M.D.   On: 02/28/2020 22:54       Subjective: At baseline dyspnea  Discharge Exam: Vitals:   03/01/20 0440 03/01/20 0602  BP: (!) 166/79 (!) 153/83  Pulse: 92 89  Resp: 16 16  Temp: 97.7 F (36.5 C) 97.7 F (36.5 C)  SpO2: 94% 96%   Vitals:   02/29/20 2025 02/29/20 2246 03/01/20 0440 03/01/20 0602  BP: (!) 190/89 (!) 162/77 (!) 166/79 (!) 153/83  Pulse: 95 99 92 89  Resp: 16 18 16 16   Temp: 98.1 F (36.7 C) 97.8 F (36.6 C) 97.7 F (36.5 C) 97.7 F (36.5 C)  TempSrc: Oral Oral Oral Oral  SpO2: 94% 92% 94% 96%  Weight:      Height:        General: Pt is alert, awake, not in acute distress Cardiovascular: RRR, S1/S2 +, no rubs, no gallops Respiratory: Diminished with mild wheezing Abdominal: Soft, NT, ND, bowel sounds + Extremities: no edema, no cyanosis    The results of significant diagnostics from this hospitalization (including imaging, microbiology, ancillary  and laboratory) are listed below for reference.     Microbiology: Recent Results (from the past 240 hour(s))  SARS CORONAVIRUS 2 (TAT 6-24 HRS) Nasopharyngeal Nasopharyngeal Swab     Status: None   Collection Time: 02/22/20  2:10 PM   Specimen: Nasopharyngeal Swab  Result Value Ref Range Status   SARS Coronavirus 2 NEGATIVE NEGATIVE Final    Comment: (NOTE) SARS-CoV-2 target nucleic acids are NOT DETECTED. The SARS-CoV-2 RNA is generally detectable in upper and lower respiratory specimens during the acute phase of infection. Negative results do not preclude SARS-CoV-2 infection, do not rule out co-infections with other pathogens, and should not be used as the sole basis for treatment or other patient management decisions. Negative results must be combined with clinical observations, patient history, and epidemiological information. The expected result is Negative. Fact Sheet for Patients: SugarRoll.be Fact Sheet for Healthcare Providers: https://www.woods-mathews.com/ This test is not yet approved or cleared by the [place] FDA and  has been authorized for detection and/or diagnosis of SARS-CoV-2 by FDA under an Emergency Use Authorization (EUA). This EUA will remain  in effect (meaning this test can be used) for the duration of the COVID-19 declaration under Section 56 4(b)(1) of the Act, 21 U.S.C. section 360bbb-3(b)(1), unless the authorization is terminated or revoked sooner. Performed at [facility], [place] [address], [place], [place] [address]   SARS CORONAVIRUS 2 (TAT 6-24 HRS) Nasopharyngeal Nasopharyngeal Swab     Status: None   Collection Time: 02/29/20 12:11 AM   Specimen: Nasopharyngeal Swab  Result Value Ref Range Status   SARS Coronavirus 2 NEGATIVE NEGATIVE Final    Comment: (NOTE) SARS-CoV-2 target nucleic acids are NOT DETECTED. The SARS-CoV-2 RNA is generally detectable in upper and lower respiratory  specimens during the acute phase of infection. Negative results do not preclude SARS-CoV-2 infection, do not rule out co-infections with other pathogens, and should not be used as the sole basis for treatment or other patient management decisions. Negative results must be combined with clinical observations, patient history, and epidemiological information. The expected result is Negative. Fact Sheet for Patients: SugarRoll.be Fact Sheet for Healthcare Providers: https://www.woods-mathews.com/ This test is not yet approved or cleared by the [place] FDA and  has been authorized for detection and/or diagnosis of SARS-CoV-2 by FDA under an Emergency Use Authorization (EUA). This EUA will remain  in effect (meaning this test can be used) for  the duration of the COVID-19 declaration under Section 56 4(b)(1) of the Act, 21 U.S.C. section 360bbb-3(b)(1), unless the authorization is terminated or revoked sooner. Performed at [facility], [place] [address], [place], [place] [address]   Culture, sputum-assessment     Status: None   Collection Time: 02/29/20  5:01 AM   Specimen: Sputum  Result Value Ref Range Status   Specimen Description SPUTUM  Final   Special Requests NONE  Final   Sputum evaluation   Final    THIS SPECIMEN IS ACCEPTABLE FOR SPUTUM CULTURE Performed at [facility], [place] [address] [place], [place] [address]    Report Status 02/29/2020 FINAL  Final  Culture, respiratory     Status: None (Preliminary result)   Collection Time: 02/29/20  5:01 AM   Specimen: SPU  Result Value Ref Range Status   Specimen Description SPUTUM  Final   Special Requests   Final    NONE Reflexed from [id number] Performed at [facility] [address], [place], [place] [address]    Gram Stain   Final    FEW WBC PRESENT, PREDOMINANTLY PMN FEW GRAM POSITIVE COCCI FEW GRAM NEGATIVE RODS FEW GRAM POSITIVE  RODS    Culture CULTURE REINCUBATED FOR BETTER GROWTH  Final   Report Status PENDING  Incomplete     Labs: BNP (last 3 results) No results for input(s): BNP in the last 8760 hours. Basic Metabolic Panel: Recent Labs  Lab 02/28/20 2332 03/01/20 0539  NA 136 142  K 5.0 4.5  CL 91* 93*  CO2 38* 39*  GLUCOSE 120* 107*  BUN 11 15  CREATININE 0.80 0.75  CALCIUM 8.6* 8.9   Liver Function Tests: Recent Labs  Lab 02/28/20 2332  AST 29  ALT 26  ALKPHOS 104  BILITOT 0.6  PROT 8.3*  ALBUMIN 3.4*   No results for input(s): LIPASE, AMYLASE in the last 168 hours. No results for input(s): AMMONIA in the last 168 hours. CBC: Recent Labs  Lab 02/28/20 2332 02/29/20 0850  WBC 11.2* 9.8  NEUTROABS 7.7 7.3  HGB 12.4* 12.1*  HCT 42.4 41.9  MCV 104.7* 104.2*  PLT 307 304   Cardiac Enzymes: No results for input(s): CKTOTAL, CKMB, CKMBINDEX, TROPONINI in the last 168 hours. BNP: Invalid input(s): POCBNP CBG: Recent Labs  Lab 02/25/20 1302  GLUCAP 113*   D-Dimer No results for input(s): DDIMER in the last 72 hours. Hgb A1c No results for input(s): HGBA1C in the last 72 hours. Lipid Profile No results for input(s): CHOL, HDL, LDLCALC, TRIG, CHOLHDL, LDLDIRECT in the last 72 hours. Thyroid function studies No results for input(s): TSH, T4TOTAL, T3FREE, THYROIDAB in the last 72 hours.  Invalid input(s): FREET3 Anemia work up No results for input(s): VITAMINB12, FOLATE, FERRITIN, TIBC, IRON, RETICCTPCT in the last 72 hours. Urinalysis    Component Value Date/Time   COLORURINE YELLOW 12/27/2017 0844   APPEARANCEUR CLEAR 12/27/2017 0844   LABSPEC 1.015 12/27/2017 0844   PHURINE 6.0 12/27/2017 0844   GLUCOSEU NEGATIVE 12/27/2017 0844   HGBUR NEGATIVE 12/27/2017 0844   BILIRUBINUR NEGATIVE 12/27/2017 0844   KETONESUR NEGATIVE 12/27/2017 0844   UROBILINOGEN 0.2 12/27/2017 0844   NITRITE NEGATIVE 12/27/2017 0844   LEUKOCYTESUR NEGATIVE 12/27/2017 0844   Sepsis  Labs Invalid input(s): PROCALCITONIN,  WBC,  LACTICIDVEN Microbiology Recent Results (from the past 240 hour(s))  SARS CORONAVIRUS 2 (TAT 6-24 HRS) Nasopharyngeal Nasopharyngeal Swab     Status: None   Collection Time:  02/22/20  2:10 PM   Specimen: Nasopharyngeal Swab  Result Value Ref Range Status   SARS Coronavirus 2 NEGATIVE NEGATIVE Final    Comment: (NOTE) SARS-CoV-2 target nucleic acids are NOT DETECTED. The SARS-CoV-2 RNA is generally detectable in upper and lower respiratory specimens during the acute phase of infection. Negative results do not preclude SARS-CoV-2 infection, do not rule out co-infections with other pathogens, and should not be used as the sole basis for treatment or other patient management decisions. Negative results must be combined with clinical observations, patient history, and epidemiological information. The expected result is Negative. Fact Sheet for Patients: SugarRoll.be Fact Sheet for Healthcare Providers: https://www.woods-mathews.com/ This test is not yet approved or cleared by the [place] FDA and  has been authorized for detection and/or diagnosis of SARS-CoV-2 by FDA under an Emergency Use Authorization (EUA). This EUA will remain  in effect (meaning this test can be used) for the duration of the COVID-19 declaration under Section 56 4(b)(1) of the Act, 21 U.S.C. section 360bbb-3(b)(1), unless the authorization is terminated or revoked sooner. Performed at [facility], [place] [address], [place], [place] [address]   SARS CORONAVIRUS 2 (TAT 6-24 HRS) Nasopharyngeal Nasopharyngeal Swab     Status: None   Collection Time: 02/29/20 12:11 AM   Specimen: Nasopharyngeal Swab  Result Value Ref Range Status   SARS Coronavirus 2 NEGATIVE NEGATIVE Final    Comment: (NOTE) SARS-CoV-2 target nucleic acids are NOT DETECTED. The SARS-CoV-2 RNA is generally detectable in upper and lower respiratory  specimens during the acute phase of infection. Negative results do not preclude SARS-CoV-2 infection, do not rule out co-infections with other pathogens, and should not be used as the sole basis for treatment or other patient management decisions. Negative results must be combined with clinical observations, patient history, and epidemiological information. The expected result is Negative. Fact Sheet for Patients: SugarRoll.be Fact Sheet for Healthcare Providers: https://www.woods-mathews.com/ This test is not yet approved or cleared by the [place] FDA and  has been authorized for detection and/or diagnosis of SARS-CoV-2 by FDA under an Emergency Use Authorization (EUA). This EUA will remain  in effect (meaning this test can be used) for the duration of the COVID-19 declaration under Section 56 4(b)(1) of the Act, 21 U.S.C. section 360bbb-3(b)(1), unless the authorization is terminated or revoked sooner. Performed at [facility], [place] [address], [place], [place] [address]   Culture, sputum-assessment     Status: None   Collection Time: 02/29/20  5:01 AM   Specimen: Sputum  Result Value Ref Range Status   Specimen Description SPUTUM  Final   Special Requests NONE  Final   Sputum evaluation   Final    THIS SPECIMEN IS ACCEPTABLE FOR SPUTUM CULTURE Performed at [facility], [place] [address], [place], [place] [address]    Report Status 02/29/2020 FINAL  Final  Culture, respiratory     Status: None (Preliminary result)   Collection Time: 02/29/20  5:01 AM   Specimen: SPU  Result Value Ref Range Status   Specimen Description SPUTUM  Final   Special Requests   Final    NONE Reflexed from [id number] Performed at [facility] [address], [place], [place] [address]    Gram Stain   Final    FEW WBC PRESENT, PREDOMINANTLY PMN FEW GRAM POSITIVE COCCI FEW GRAM NEGATIVE RODS FEW GRAM POSITIVE  RODS    Culture CULTURE REINCUBATED FOR BETTER GROWTH  Final  Report Status PENDING  Incomplete     Time coordinating discharge: 35 minutes  SIGNED:   Cordelia Poche, MD [facility] 03/01/2020, 1:10 PM

## 2020-03-01 NOTE — Evaluation (Signed)
Physical Therapy Evaluation Patient Details Name: Ryan Contreras MRN: [id number] DOB: 11/03/1941 Today's Date: 03/01/2020   History of Present Illness  patient presented secondary to dyspnea, fatigue and cough and found to have a COPD exacerbation;  Pt with hx of DM, COPD, CAD and Lung CA  Clinical Impression  Pt admitted as above and presenting with functional mobility limitations 2* ambulatory balance deficits and c/o being "a little short winded".  Pt ambulated this date with 2L O2 and with sats at 93% but with HR elevated to 111 - RN aware.  Pt eager to dc home and would benefit from follow up HHPT to further address deficits and maximize IND and safety.    Follow Up Recommendations Home health PT    Equipment Recommendations  3in1 (PT)    Recommendations for Other Services       Precautions / Restrictions Precautions Precautions: Fall Restrictions Weight Bearing Restrictions: No      Mobility  Bed Mobility Overal bed mobility: Needs Assistance Bed Mobility: Supine to Sit     Supine to sit: Supervision     General bed mobility comments: for safety  Transfers Overall transfer level: Needs assistance   Transfers: Sit to/from Stand Sit to Stand: Min guard         General transfer comment: steady assist; pt reports bil foot numbness but states similar to at home  Ambulation/Gait Ambulation/Gait assistance: Min guard Gait Distance (Feet): 200 Feet Assistive device: Rolling walker (2 wheeled) Gait Pattern/deviations: Step-through pattern;Decreased step length - right;Decreased step length - left;Shuffle;Wide base of support Gait velocity: decr   General Gait Details: cues or posture, position from RW and safety awareness esp on turns.  Stairs            Wheelchair Mobility    Modified Rankin (Stroke Patients Only)       Balance Overall balance assessment: Needs assistance Sitting-balance support: No upper extremity supported;Feet  supported Sitting balance-Leahy Scale: Good     Standing balance support: No upper extremity supported Standing balance-Leahy Scale: Fair                               Pertinent Vitals/Pain      Home Living Family/patient expects to be discharged to:: Private residence Living Arrangements: Children(dtr) Available Help at Discharge: Family Type of Home: House Home Access: Level entry     Home Layout: [place]: Grab bars - tub/shower;Walker - 2 wheels;Other (comment) Additional Comments: report experiencing a few falls at home, daughter mobilizes with a walker and O2 in home     Prior Function Level of Independence: Independent with assistive device(s)               Hand Dominance   Dominant Hand: Left    Extremity/Trunk Assessment   Upper Extremity Assessment Upper Extremity Assessment: Overall WFL for tasks assessed    Lower Extremity Assessment Lower Extremity Assessment: Overall WFL for tasks assessed;RLE deficits/detail;LLE deficits/detail RLE Sensation: history of peripheral neuropathy LLE Sensation: history of peripheral neuropathy       Communication   Communication: No difficulties  Cognition Arousal/Alertness: Awake/alert Behavior During Therapy: WFL for tasks assessed/performed Overall Cognitive Status: Within Functional Limits for tasks assessed  General Comments      Exercises     Assessment/Plan    PT Assessment Patient needs continued PT services  PT Problem List Decreased activity tolerance;Decreased balance;Decreased mobility;Decreased knowledge of use of DME;Decreased safety awareness       PT Treatment Interventions DME instruction;Gait training;Stair training;Functional mobility training;Therapeutic activities;Therapeutic exercise;Balance training;Patient/family education    PT Goals (Current goals can be found in the Care Plan section)  Acute  Rehab PT Goals Patient Stated Goal: regain IND PT Goal Formulation: With patient Time For Goal Achievement: 03/15/20 Potential to Achieve Goals: Good    Frequency Min 3X/week   Barriers to discharge        Co-evaluation               AM-PAC PT "6 Clicks" Mobility  Outcome Measure Help needed turning from your back to your side while in a flat bed without using bedrails?: None Help needed moving from lying on your back to sitting on the side of a flat bed without using bedrails?: A Little Help needed moving to and from a bed to a chair (including a wheelchair)?: A Little Help needed standing up from a chair using your arms (e.g., wheelchair or bedside chair)?: A Little Help needed to walk in hospital room?: A Little Help needed climbing 3-5 steps with a railing? : A Little 6 Click Score: 19    End of Session Equipment Utilized During Treatment: Gait belt;Oxygen Activity Tolerance: Patient tolerated treatment well Patient left: in chair;with call bell/phone within reach;with chair alarm set;with nursing/sitter in room;with family/visitor present Nurse Communication: Mobility status PT Visit Diagnosis: History of falling (Z91.81);Difficulty in walking, not elsewhere classified (R26.2);Unsteadiness on feet (R26.81)    Time: 5183-4373 PT Time Calculation (min) (ACUTE ONLY): 24 min   Charges:     PT Treatments $Gait Training: 8-22 mins        [place] Pager [phone] Office [phone]   Kel Senn 03/01/2020, 3:26 PM

## 2020-03-02 DIAGNOSIS — J441 Chronic obstructive pulmonary disease with (acute) exacerbation: Secondary | ICD-10-CM | POA: Diagnosis not present

## 2020-03-03 ENCOUNTER — Inpatient Hospital Stay: Payer: Medicare PPO | Attending: Internal Medicine

## 2020-03-03 ENCOUNTER — Encounter: Payer: Self-pay | Admitting: Internal Medicine

## 2020-03-03 ENCOUNTER — Encounter: Payer: Self-pay | Admitting: *Deleted

## 2020-03-03 ENCOUNTER — Other Ambulatory Visit: Payer: Self-pay

## 2020-03-03 ENCOUNTER — Inpatient Hospital Stay (HOSPITAL_BASED_OUTPATIENT_CLINIC_OR_DEPARTMENT_OTHER): Payer: Medicare PPO | Admitting: Internal Medicine

## 2020-03-03 ENCOUNTER — Telehealth: Payer: Self-pay | Admitting: Internal Medicine

## 2020-03-03 VITALS — BP 152/78 | HR 103 | Temp 98.7°F | Resp 18 | Ht 73.0 in | Wt 227.8 lb

## 2020-03-03 DIAGNOSIS — M1990 Unspecified osteoarthritis, unspecified site: Secondary | ICD-10-CM | POA: Diagnosis not present

## 2020-03-03 DIAGNOSIS — R5383 Other fatigue: Secondary | ICD-10-CM | POA: Insufficient documentation

## 2020-03-03 DIAGNOSIS — Z7982 Long term (current) use of aspirin: Secondary | ICD-10-CM | POA: Insufficient documentation

## 2020-03-03 DIAGNOSIS — Z955 Presence of coronary angioplasty implant and graft: Secondary | ICD-10-CM | POA: Diagnosis not present

## 2020-03-03 DIAGNOSIS — Z7189 Other specified counseling: Secondary | ICD-10-CM

## 2020-03-03 DIAGNOSIS — F17210 Nicotine dependence, cigarettes, uncomplicated: Secondary | ICD-10-CM | POA: Insufficient documentation

## 2020-03-03 DIAGNOSIS — I10 Essential (primary) hypertension: Secondary | ICD-10-CM

## 2020-03-03 DIAGNOSIS — Z7689 Persons encountering health services in other specified circumstances: Secondary | ICD-10-CM | POA: Insufficient documentation

## 2020-03-03 DIAGNOSIS — Z5112 Encounter for antineoplastic immunotherapy: Secondary | ICD-10-CM | POA: Diagnosis not present

## 2020-03-03 DIAGNOSIS — C3411 Malignant neoplasm of upper lobe, right bronchus or lung: Secondary | ICD-10-CM | POA: Diagnosis not present

## 2020-03-03 DIAGNOSIS — I2510 Atherosclerotic heart disease of native coronary artery without angina pectoris: Secondary | ICD-10-CM | POA: Insufficient documentation

## 2020-03-03 DIAGNOSIS — C3491 Malignant neoplasm of unspecified part of right bronchus or lung: Secondary | ICD-10-CM

## 2020-03-03 DIAGNOSIS — I119 Hypertensive heart disease without heart failure: Secondary | ICD-10-CM | POA: Diagnosis not present

## 2020-03-03 DIAGNOSIS — M109 Gout, unspecified: Secondary | ICD-10-CM | POA: Insufficient documentation

## 2020-03-03 DIAGNOSIS — E785 Hyperlipidemia, unspecified: Secondary | ICD-10-CM | POA: Insufficient documentation

## 2020-03-03 DIAGNOSIS — Z7902 Long term (current) use of antithrombotics/antiplatelets: Secondary | ICD-10-CM | POA: Insufficient documentation

## 2020-03-03 DIAGNOSIS — Z923 Personal history of irradiation: Secondary | ICD-10-CM | POA: Insufficient documentation

## 2020-03-03 DIAGNOSIS — Z7952 Long term (current) use of systemic steroids: Secondary | ICD-10-CM | POA: Diagnosis not present

## 2020-03-03 DIAGNOSIS — J449 Chronic obstructive pulmonary disease, unspecified: Secondary | ICD-10-CM | POA: Insufficient documentation

## 2020-03-03 DIAGNOSIS — E119 Type 2 diabetes mellitus without complications: Secondary | ICD-10-CM | POA: Diagnosis not present

## 2020-03-03 DIAGNOSIS — Z79899 Other long term (current) drug therapy: Secondary | ICD-10-CM | POA: Insufficient documentation

## 2020-03-03 DIAGNOSIS — Z5111 Encounter for antineoplastic chemotherapy: Secondary | ICD-10-CM | POA: Insufficient documentation

## 2020-03-03 DIAGNOSIS — Z8711 Personal history of peptic ulcer disease: Secondary | ICD-10-CM | POA: Diagnosis not present

## 2020-03-03 LAB — CMP (CANCER CENTER ONLY)
ALT: 19 U/L (ref 0–44)
AST: 23 U/L (ref 15–41)
Albumin: 3 g/dL — ABNORMAL LOW (ref 3.5–5.0)
Alkaline Phosphatase: 94 U/L (ref 38–126)
Anion gap: 8 (ref 5–15)
BUN: 15 mg/dL (ref 8–23)
CO2: 38 mmol/L — ABNORMAL HIGH (ref 22–32)
Calcium: 8.9 mg/dL (ref 8.9–10.3)
Chloride: 95 mmol/L — ABNORMAL LOW (ref 98–111)
Creatinine: 0.81 mg/dL (ref 0.61–1.24)
GFR, Est AFR Am: >60 mL/min (ref >60)
GFR, Estimated: >60 mL/min (ref >60)
Glucose, Bld: 110 mg/dL — ABNORMAL HIGH (ref 70–99)
Potassium: 4.2 mmol/L (ref 3.5–5.1)
Sodium: 141 mmol/L (ref 135–145)
Total Bilirubin: 0.4 mg/dL (ref 0.3–1.2)
Total Protein: 7.6 g/dL (ref 6.5–8.1)

## 2020-03-03 LAB — CBC WITH DIFFERENTIAL (CANCER CENTER ONLY)
Abs Immature Granulocytes: 0.11 10*3/uL — ABNORMAL HIGH (ref 0.00–0.07)
Basophils Absolute: 0.1 10*3/uL (ref 0.0–0.1)
Basophils Relative: 0 %
Eosinophils Absolute: 0 10*3/uL (ref 0.0–0.5)
Eosinophils Relative: 0 %
HCT: 38.9 % — ABNORMAL LOW (ref 39.0–52.0)
Hemoglobin: 12 g/dL — ABNORMAL LOW (ref 13.0–17.0)
Immature Granulocytes: 1 %
Lymphocytes Relative: 9 %
Lymphs Abs: 1.2 10*3/uL (ref 0.7–4.0)
MCH: 29.7 pg (ref 26.0–34.0)
MCHC: 30.8 g/dL (ref 30.0–36.0)
MCV: 96.3 fL (ref 80.0–100.0)
Monocytes Absolute: 1.4 10*3/uL — ABNORMAL HIGH (ref 0.1–1.0)
Monocytes Relative: 10 %
Neutro Abs: 10.6 10*3/uL — ABNORMAL HIGH (ref 1.7–7.7)
Neutrophils Relative %: 80 %
Platelet Count: 297 10*3/uL (ref 150–400)
RBC: 4.04 MIL/uL — ABNORMAL LOW (ref 4.22–5.81)
RDW: 14.9 % (ref 11.5–15.5)
WBC Count: 13.4 10*3/uL — ABNORMAL HIGH (ref 4.0–10.5)
nRBC: 0.1 % (ref 0.0–0.2)

## 2020-03-03 LAB — CULTURE, RESPIRATORY W GRAM STAIN: Culture: NORMAL

## 2020-03-03 MED ORDER — PROCHLORPERAZINE MALEATE 10 MG PO TABS: 10.0000 mg | ORAL_TABLET | Freq: Four times a day (QID) | ORAL | 0 refills | Status: DC | PRN

## 2020-03-03 MED ORDER — LIDOCAINE-PRILOCAINE 2.5-2.5 % EX CREA: TOPICAL_CREAM | CUTANEOUS | 0 refills | Status: DC

## 2020-03-03 NOTE — Progress Notes (Signed)
START OFF PATHWAY REGIMEN - Non-Small Cell Lung   [id number]:Carboplatin AUC=6 IV D1 + Paclitaxel 200 mg/m2 IV D1 + Pembrolizumab 200 mg IV D1 q21 Days x 4 Cycles:   A cycle is every 21 days:     Pembrolizumab      Paclitaxel      Carboplatin   **Always confirm dose/schedule in your pharmacy ordering system**  Patient Characteristics: Local Recurrence Therapeutic Status: Local Recurrence Intent of Therapy: Non-Curative / Palliative Intent, Discussed with Patient

## 2020-03-03 NOTE — Progress Notes (Signed)
Oncology Nurse Navigator Documentation  Oncology Nurse Navigator Flowsheets 03/03/2020  Abnormal Finding Date -  Confirmed Diagnosis Date -  Navigator Location [facility]  Referral Date to RadOnc/MedOnc -  Navigator Encounter Type Clinic/MDC/I spoke with Ryan Contreras today.  He will be starting chemo next week.  I helped educate on plan of care. Per Dr. Julien Nordmann, he would like PDL 1 to be sent on recent bx.  I notified pathology dept of his request.   Telephone -  Treatment Initiated Date -  Patient Visit Type MedOnc  Treatment Phase Pre-Tx/Tx Discussion  Barriers/Navigation Needs Coordination of Care;Education  Education Other  Interventions Coordination of Care;Education;Psycho-Social Support  Acuity Level 2-Minimal Needs (1-2 Barriers Identified)  Coordination of Care Other  Education Method Verbal  Time Spent with Patient 15

## 2020-03-03 NOTE — Progress Notes (Signed)
Ryan Contreras [phone]   [phone]  OFFICE PROGRESS NOTE  Cyndi Bender, PA-C [address] [place] [address]  DIAGNOSIS: Recurrent non-small cell lung cancer, squamous cell carcinoma initially presented as unresectable Stage IIA (T1b, N1, M0) non-small cell lung cancer, squamous cell carcinoma presented with right upper lobe lung nodule and hilar lymphadenopathy diagnosed in January 2017.  PRIOR THERAPY:  1) Course of concurrent chemoradiation with weekly carboplatin for AUC of 2 and paclitaxel 45 MG/M2. Status post 7 cycles, last dose was given 03/14/2016 with partial response. 2) status post stereotactic radiotherapy to recurrent lung cancer in the right upper lobe under the care of Dr. Lisbeth Renshaw completed on 06/08/2018  CURRENT THERAPY: Systemic chemotherapy with carboplatin for AUC of 5, paclitaxel 175 mg/M2 and Keytruda 200 mg IV every 3 weeks with Neulasta support.  First cycle March 10, 2020.  INTERVAL HISTORY: Ryan Contreras 79 y.o. male returns to the clinic today for follow-up visit accompanied by his wife.  The patient continues to have the baseline shortness of breath increased with exertion and he is currently on home oxygen.  He was found on recent imaging studies including CT scan of the chest followed by a PET scan to have evidence for disease recurrence in the right lung.  The patient was referred to Dr. Lamonte Sakai and he underwent repeat bronchoscopy with biopsy of the suspicious disease recurrence and the final pathology was consistent with squamous cell carcinoma.  He denied having any current nausea, vomiting, diarrhea or constipation.  He denied having any fever or chills.  He has no chest pain but has shortness of breath at baseline increased with exertion and mild cough with no hemoptysis.  He denied having any recent weight loss or night sweats.  He has no headache or visual changes.  He is here today for evaluation and discussion of  his treatment options based on the recent biopsy results.  MEDICAL HISTORY: Past Medical History:  Diagnosis Date  . Arthritis   . CAD, NATIVE VESSEL    coronary stent x1-tx. Plavix use.  Ryan Contreras COPD (chronic obstructive pulmonary disease) ([place])    due to 50+ years Smoking.  Ryan Contreras Dyspnea    wears oxygen PRN  . Gastrointestinal bleeding, upper 04/29/2016  . Gout, unspecified    foot  . HYPERLIPIDEMIA   . HYPERTENSION   . Lung mass   . NSCL ca dx'd 12/2015   Lung  . On supplemental oxygen by nasal cannula    PRN  . PEPTIC ULCER DISEASE   . Pneumonia   . TOBACCO ABUSE   . Type 2 diabetes mellitus ([place])    denies on 01-06-16    ALLERGIES:  has No Known Allergies.  MEDICATIONS:  Current Outpatient Medications  Medication Sig Dispense Refill  . acetaminophen (TYLENOL) 500 MG tablet Take 250 mg by mouth every 6 (six) hours as needed for moderate pain or headache.     . allopurinol (ZYLOPRIM) 100 MG tablet TAKE 1 TABLET BY MOUTH EVERY DAY (Patient taking differently: Take 100 mg by mouth daily. ) 90 tablet 0  . aspirin 81 MG tablet Take 1 tablet (81 mg total) by mouth at bedtime. Restart on 03/09/18 30 tablet   . atorvastatin (LIPITOR) 80 MG tablet TAKE 1 TABLET BY MOUTH EVERY DAY (Patient taking differently: Take 80 mg by mouth daily. ) 30 tablet 8  . azithromycin (ZITHROMAX) 250 MG tablet Take 2 tablets (500 mg total) by mouth  daily for 3 days. Take 1 tablet daily for 3 days. 6 tablet 0  . brimonidine (ALPHAGAN) 0.15 % ophthalmic solution Place 1 drop into both eyes in the morning and at bedtime.     . clopidogrel (PLAVIX) 75 MG tablet Take 1 tablet (75 mg total) by mouth daily. Okay to restart this medication on 02/26/2020    . docusate sodium (COLACE) 100 MG capsule Take 100 mg by mouth daily.     Ryan Contreras eplerenone (INSPRA) 25 MG tablet TAKE 1 TABLET BY MOUTH EVERY DAY (Patient taking differently: Take 25 mg by mouth daily. ) 30 tablet 5  . HYDROcodone-homatropine (HYCODAN) 5-1.5 MG/5ML syrup  Take 5 mLs by mouth every 6 (six) hours as needed for cough. 240 mL 0  . KLOR-CON M20 20 MEQ tablet TAKE 1 TABLET BY MOUTH DAILY (Patient taking differently: Take 20 mEq by mouth daily. ) 30 tablet 11  . metoprolol succinate (TOPROL-XL) 25 MG 24 hr tablet Take 25 mg by mouth daily.     . Na Sulfate-K Sulfate-Mg Sulf 17.5-3.13-1.6 GM/177ML SOLN Suprep-Use as directed (Patient not taking: Reported on 02/29/2020) 354 mL 0  . OXYGEN Inhale 2 L into the lungs at bedtime.    . pantoprazole (PROTONIX) 40 MG tablet TAKE 40 MG BY MOUTH EVERY DAY (Patient taking differently: Take 40 mg by mouth daily. ) 90 tablet 3  . polyethylene glycol (MIRALAX / GLYCOLAX) 17 g packet Take 17 g by mouth daily as needed for mild constipation or moderate constipation.    . predniSONE (DELTASONE) 10 MG tablet Take 4 tablets (40 mg total) by mouth daily with breakfast for 2 days, THEN 2 tablets (20 mg total) daily with breakfast for 2 days, THEN 1 tablet (10 mg total) daily with breakfast for 2 days. 14 tablet 0  . PROAIR HFA 108 (90 Base) MCG/ACT inhaler Inhale 2 puffs into the lungs every 6 (six) hours as needed for wheezing or shortness of breath.  0  . sildenafil (REVATIO) 20 MG tablet Take 100 mg by mouth daily as needed for erectile dysfunction.    . TRELEGY ELLIPTA 100-62.5-25 MCG/INH AEPB Inhale 1 puff into the lungs daily. 60 each 11   No current facility-administered medications for this visit.    SURGICAL HISTORY:  Past Surgical History:  Procedure Laterality Date  . BRONCHIAL BIOPSY  02/25/2020   Procedure: BRONCHIAL BIOPSIES;  Surgeon: Collene Gobble, MD;  Location: [facility] ENDOSCOPY;  Service: Pulmonary;;  . BRONCHIAL BRUSHINGS  02/25/2020   Procedure: BRONCHIAL BRUSHINGS;  Surgeon: Collene Gobble, MD;  Location: [facility] ENDOSCOPY;  Service: Pulmonary;;  . CARDIAC CATHETERIZATION     '06-stent placed, 6'13 - Dr. Lizbeth Bark follows  . COLONOSCOPY WITH PROPOFOL N/A 09/24/2019   Procedure: COLONOSCOPY WITH PROPOFOL;   Surgeon: Irene Shipper, MD;  Location: [facility] ENDOSCOPY;  Service: Endoscopy;  Laterality: N/A;  . CORONARY STENT PLACEMENT     s/p  . ENDOBRONCHIAL ULTRASOUND Bilateral 01/11/2016   Procedure: ENDOBRONCHIAL ULTRASOUND;  Surgeon: Collene Gobble, MD;  Location: [facility] ENDOSCOPY;  Service: Endoscopy;  Laterality: Bilateral;  . ESOPHAGOGASTRODUODENOSCOPY (EGD) WITH PROPOFOL N/A 09/24/2019   Procedure: ESOPHAGOGASTRODUODENOSCOPY (EGD) WITH PROPOFOL;  Surgeon: Irene Shipper, MD;  Location: [facility] ENDOSCOPY;  Service: Endoscopy;  Laterality: N/A;  . POLYPECTOMY  09/24/2019   Procedure: POLYPECTOMY;  Surgeon: Irene Shipper, MD;  Location: [facility] ENDOSCOPY;  Service: Endoscopy;;  . VIDEO BRONCHOSCOPY  02/25/2020   Procedure: VIDEO BRONCHOSCOPY WITHOUT FLUORO;  Surgeon: Collene Gobble,  MD;  Location: [facility];  Service: Pulmonary;;  . VIDEO BRONCHOSCOPY WITH ENDOBRONCHIAL NAVIGATION Right 03/07/2018   Procedure: VIDEO BRONCHOSCOPY WITH ENDOBRONCHIAL NAVIGATION;  Surgeon: Collene Gobble, MD;  Location: [facility] OR;  Service: Thoracic;  Laterality: Right;    REVIEW OF SYSTEMS:  Constitutional: positive for fatigue Eyes: negative Ears, nose, mouth, throat, and face: negative Respiratory: positive for cough and dyspnea on exertion Cardiovascular: negative Gastrointestinal: negative Genitourinary:negative Integument/breast: negative Hematologic/lymphatic: negative Musculoskeletal:negative Neurological: negative Behavioral/Psych: negative Endocrine: negative Allergic/Immunologic: negative   PHYSICAL EXAMINATION: General appearance: alert, cooperative, fatigued and no distress Head: Normocephalic, without obvious abnormality, atraumatic Neck: no adenopathy, no JVD, supple, symmetrical, trachea midline and thyroid not enlarged, symmetric, no tenderness/mass/nodules Lymph nodes: Cervical, supraclavicular, and axillary nodes normal. Resp: clear to auscultation bilaterally Back: symmetric, no curvature. ROM normal. No  CVA tenderness. Cardio: regular rate and rhythm, S1, S2 normal, no murmur, click, rub or gallop GI: soft, non-tender; bowel sounds normal; no masses,  no organomegaly Extremities: extremities normal, atraumatic, no cyanosis or edema Neurologic: Alert and oriented X 3, normal strength and tone. Normal symmetric reflexes. Normal coordination and gait  ECOG PERFORMANCE STATUS: 1 - Symptomatic but completely ambulatory  Blood pressure (!) 152/78, pulse (!) 103, temperature 98.7 F (37.1 C), temperature source Temporal, resp. rate 18, height 6\' 1"  (1.854 m), weight 227 lb 12.8 oz (103.3 kg), SpO2 95 %.  LABORATORY DATA: Lab Results  Component Value Date   WBC 13.4 (H) 03/03/2020   HGB 12.0 (L) 03/03/2020   HCT 38.9 (L) 03/03/2020   MCV 96.3 03/03/2020   PLT 297 03/03/2020      Chemistry      Component Value Date/Time   NA 141 03/03/2020 1046   NA 141 06/16/2017 0906   K 4.2 03/03/2020 1046   K 3.4 (L) 06/16/2017 0906   CL 95 (L) 03/03/2020 1046   CO2 38 (H) 03/03/2020 1046   CO2 35 (H) 06/16/2017 0906   BUN 15 03/03/2020 1046   BUN 10.3 06/16/2017 0906   CREATININE 0.81 03/03/2020 1046   CREATININE 0.8 06/16/2017 0906      Component Value Date/Time   CALCIUM 8.9 03/03/2020 1046   CALCIUM 9.3 06/16/2017 0906   ALKPHOS 94 03/03/2020 1046   ALKPHOS 87 06/16/2017 0906   AST 23 03/03/2020 1046   AST 21 06/16/2017 0906   ALT 19 03/03/2020 1046   ALT 15 06/16/2017 0906   BILITOT 0.4 03/03/2020 1046   BILITOT 0.46 06/16/2017 0906       RADIOGRAPHIC STUDIES: NM PET Image Restag (PS) Skull Base To Thigh  Result Date: 02/05/2020 CLINICAL DATA:  Subsequent treatment strategy for non-small cell lung cancer. EXAM: NUCLEAR MEDICINE PET SKULL BASE TO THIGH TECHNIQUE: 11.43 mCi F-18 FDG was injected intravenously. Full-ring PET imaging was performed from the skull base to thigh after the radiotracer. CT data was obtained and used for attenuation correction and anatomic localization.  Fasting blood glucose: 92 mg/dl COMPARISON:  Previous evaluation dated 04/11/2018 FINDINGS: Mediastinal blood pool activity: SUV max 2.12 Liver activity: SUV max NA NECK: Symmetric tonsillar activity. 6 mm left level 2 lymph node (SUVmax = 3.67) (image 27, series 4) no nodes enlarged by size criteria in the neck Incidental CT findings: Calcified atherosclerotic changes in the carotid arteries. CHEST: Enlarging right upper lobe nodular area of consolidative changes measuring 3.0 x 2.3 cm on today's study, subtle area on the study of 07/19/2019 and also described on the CT of 01/20/2020 (SUVmax = 19.45) Intense  FDG uptake also in collapsed lung adjacent to this area (SUVmax = 7.86). This area is morphologically similar over a series of prior exams aside from the more nodular peripheral area. Thickening posterior to the right mainstem and bronchus intermedius (image 66, series 4) this measures 11 mm in greatest thickness as compared to 7 mm in August of 2020 (SUVmax = 17.71 Pleural thickening and small amount of pleural fluid without significant FDG uptake with the exception of pleural thickening in the medial right chest along the spine and posterior to the aorta (image 62, series 4) this measures approximately 8 mm greatest thickness and shows a similar appearance to the study of August of 2020 (SUVmax = 3.3) Mottled activity in the right pectoralis muscle with thickening of musculature and associated fracture of the right fourth rib anteriorly. No measurable lesion in this location (SUVmax = 3.07) Extension of soft tissue into the chest wall between ribs at this location and other areas bordering the consolidative process in the right upper chest showing similar FDG uptake to the area of adjacent disease recurrence and consolidation. Incidental CT findings: Findings as discussed on recent CT of the chest with rib fracture involving the right fourth rib anteriorly. And thickening of right pectoralis musculature along  the chest wall. Signs of calcified atherosclerosis, no signs of aneurysm. Calcified coronary artery disease. ABDOMEN/PELVIS: Intense uptake in the peripheral prostate posteriorly (SUVmax = 8.16) No additional foci of increased FDG uptake. Incidental CT findings: Large right inguinal hernia containing the appendix and right lower quadrant small bowel loops. Signs of aortic atherosclerosis without signs of aneurysm. SKELETON: No focal hypermetabolic activity to suggest skeletal metastasis. Signs of rib fracture along the anterior right chest likely related to osteitis in the setting of prior radiation. Soft tissue changes are equivocal. Incidental CT findings: none IMPRESSION: 1. Intense hypermetabolic activity with enlarging lesion in the right upper lobe peripheral to consolidated lung and in peribronchial soft tissue in the central chest adjacent to bronchus intermedius. Findings are suspicious for disease recurrence. 2. Intense FDG uptake with some loss of fat planes in the intercostal musculature at the margin of collapsed lung, attention on follow-up. 3. Equivocal FDG uptake in the collapsed lung, less than the suspected areas of recurrence may relate primarily to post radiation changes. Disease involvement in this area however given above findings is considered. 4. Mildly FDG avid left neck lymph node only slightly above blood pool, this may be reactive or neoplastic. 5. Prostate activity increased from the previous study but diminished compared to the study of January 2019. Prostate cancer or prostatitis could have this appearance. 6. 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 Surrounding soft tissue activity is also potentially radiation related. Attention on follow-up. Electronically Signed   By: Zetta Bills M.D.   On: 02/05/2020 10:35   DG Chest Portable 1 View  Result Date: 02/28/2020 CLINICAL DATA:  Shortness of breath EXAM: PORTABLE CHEST 1 VIEW COMPARISON:   06/26/2018.  Chest CT 01/20/2020 FINDINGS: Right central mass and right upper lobe adjacent airspace disease again noted, similar to recent CT. Mild cardiomegaly. Left lung clear. No effusions or acute bony abnormality. IMPRESSION: Central right lung mass with adjacent right upper lobe atelectasis/airspace disease similar to recent CT. Cardiomegaly. Electronically Signed   By: Rolm Baptise M.D.   On: 02/28/2020 22:54    ASSESSMENT AND PLAN:  This is a very pleasant 79 years old African-American male with recurrent non-small cell lung cancer, squamous  cell carcinoma presented initially as unresectable a stage IIA non-small cell lung cancer, squamous cell carcinoma status post a course of concurrent chemoradiation.  The patient has been in observation since April 2017. His scan showed enlarging soft tissue mass in the right upper lobe suspicious for disease recurrence. I ordered a PET scan which was performed yesterday but unfortunately the final report is not available at this point.  I personally and independently reviewed the scan images and discussed the results with the patient today.  His PET scan showed hypermetabolic activity in the right upper lobe mass in addition to mediastinal lymphadenopathy.  The patient underwent repeat bronchoscopy with biopsy of the suspicious recurrent lung cancer under the care of Dr. Lamonte Sakai and the final pathology was consistent with a squamous cell carcinoma. I had a lengthy discussion with the patient and his wife today about his current disease stage, prognosis and treatment options.  I gave the patient the option of palliative care and hospice referral versus consideration of palliative systemic chemotherapy with carboplatin for AUC of 5, paclitaxel 175 mg/M2 and Keytruda 200 mg IV every 3 weeks.  The patient is interested in proceeding with systemic chemotherapy. I discussed with him the adverse effect of this treatment including but not limited to alopecia,  myelosuppression, nausea and vomiting, peripheral neuropathy, liver or renal dysfunction as well as immunotherapy adverse effects. He is expected to start the first cycle of this treatment next week. I will arrange for the patient to have a Port-A-Cath placed for IV access. I will send a prescription for Compazine as well as EMLA cream to his pharmacy. The patient will come back for follow-up visit in 2 weeks for evaluation and management of any adverse effect of his treatment. He was advised to call immediately if he has any concerning symptoms in the interval. For the COPD, he is followed by Dr. Lamonte Sakai.  He will continue on his home oxygen for now.  The patient voices understanding of current disease status and treatment options and is in agreement with the current care plan.  Lung cancer under the care of Dr. Lamonte Sakai and the final pathology was consistent with a squamous cell carcinoma. . All questions were answered. The patient knows to call the clinic with any problems, questions or concerns. We can certainly see the patient much sooner if necessary.  Disclaimer: This note was dictated with voice recognition software. Similar sounding words can inadvertently be transcribed and may be missed upon review.

## 2020-03-03 NOTE — Telephone Encounter (Signed)
Scheduled per los. Gave avs and calendar  

## 2020-03-04 DIAGNOSIS — I119 Hypertensive heart disease without heart failure: Secondary | ICD-10-CM | POA: Diagnosis not present

## 2020-03-04 DIAGNOSIS — J9621 Acute and chronic respiratory failure with hypoxia: Secondary | ICD-10-CM | POA: Diagnosis not present

## 2020-03-04 DIAGNOSIS — I2510 Atherosclerotic heart disease of native coronary artery without angina pectoris: Secondary | ICD-10-CM | POA: Diagnosis not present

## 2020-03-04 DIAGNOSIS — E119 Type 2 diabetes mellitus without complications: Secondary | ICD-10-CM | POA: Diagnosis not present

## 2020-03-04 DIAGNOSIS — C3432 Malignant neoplasm of lower lobe, left bronchus or lung: Secondary | ICD-10-CM | POA: Diagnosis not present

## 2020-03-04 DIAGNOSIS — J9622 Acute and chronic respiratory failure with hypercapnia: Secondary | ICD-10-CM | POA: Diagnosis not present

## 2020-03-04 DIAGNOSIS — J441 Chronic obstructive pulmonary disease with (acute) exacerbation: Secondary | ICD-10-CM | POA: Diagnosis not present

## 2020-03-04 DIAGNOSIS — M109 Gout, unspecified: Secondary | ICD-10-CM | POA: Diagnosis not present

## 2020-03-04 DIAGNOSIS — C3411 Malignant neoplasm of upper lobe, right bronchus or lung: Secondary | ICD-10-CM | POA: Diagnosis not present

## 2020-03-05 ENCOUNTER — Other Ambulatory Visit: Payer: Self-pay | Admitting: *Deleted

## 2020-03-05 ENCOUNTER — Telehealth: Payer: Self-pay | Admitting: *Deleted

## 2020-03-05 ENCOUNTER — Other Ambulatory Visit: Payer: Self-pay | Admitting: Oncology

## 2020-03-05 DIAGNOSIS — J9621 Acute and chronic respiratory failure with hypoxia: Secondary | ICD-10-CM | POA: Diagnosis not present

## 2020-03-05 DIAGNOSIS — E119 Type 2 diabetes mellitus without complications: Secondary | ICD-10-CM | POA: Diagnosis not present

## 2020-03-05 DIAGNOSIS — C3490 Malignant neoplasm of unspecified part of unspecified bronchus or lung: Secondary | ICD-10-CM | POA: Diagnosis not present

## 2020-03-05 DIAGNOSIS — M109 Gout, unspecified: Secondary | ICD-10-CM | POA: Diagnosis not present

## 2020-03-05 DIAGNOSIS — I119 Hypertensive heart disease without heart failure: Secondary | ICD-10-CM | POA: Diagnosis not present

## 2020-03-05 DIAGNOSIS — J9622 Acute and chronic respiratory failure with hypercapnia: Secondary | ICD-10-CM | POA: Diagnosis not present

## 2020-03-05 DIAGNOSIS — Z79899 Other long term (current) drug therapy: Secondary | ICD-10-CM | POA: Diagnosis not present

## 2020-03-05 DIAGNOSIS — J441 Chronic obstructive pulmonary disease with (acute) exacerbation: Secondary | ICD-10-CM | POA: Diagnosis not present

## 2020-03-05 DIAGNOSIS — I2510 Atherosclerotic heart disease of native coronary artery without angina pectoris: Secondary | ICD-10-CM | POA: Diagnosis not present

## 2020-03-05 DIAGNOSIS — Z6830 Body mass index (BMI) 30.0-30.9, adult: Secondary | ICD-10-CM | POA: Diagnosis not present

## 2020-03-05 DIAGNOSIS — C3432 Malignant neoplasm of lower lobe, left bronchus or lung: Secondary | ICD-10-CM | POA: Diagnosis not present

## 2020-03-05 DIAGNOSIS — I10 Essential (primary) hypertension: Secondary | ICD-10-CM | POA: Diagnosis not present

## 2020-03-05 DIAGNOSIS — C3411 Malignant neoplasm of upper lobe, right bronchus or lung: Secondary | ICD-10-CM | POA: Diagnosis not present

## 2020-03-05 NOTE — Progress Notes (Signed)
The proposed treatment discussed in cancer conference 03/05/20 is for discussion purpose only and is not a binding recommendation.  The patient was not physically examined nor present for their treatment options.  Therefore, final treatment plans cannot be decided.

## 2020-03-05 NOTE — Telephone Encounter (Signed)
Received message from [place] in IR today stating that she was having a hard time scheduling port & pt not understanding things. She had told him he needed to hold his plavix x 5 days before port placed.  I called pt & informed him to hold plavix & explained reasoning & that IR should call him back with a time for port.  Pt called back asking to speak to nurse this pm & asked about port.  He would like to schedule everything in one day.  Informed that may not be possible.  Called IR & they will call him & try to help him understand.  She didn't have an opening until 3/29 which is his chemo date so may have to have first treatment peripherally.

## 2020-03-06 DIAGNOSIS — C3411 Malignant neoplasm of upper lobe, right bronchus or lung: Secondary | ICD-10-CM | POA: Diagnosis not present

## 2020-03-06 DIAGNOSIS — I2510 Atherosclerotic heart disease of native coronary artery without angina pectoris: Secondary | ICD-10-CM | POA: Diagnosis not present

## 2020-03-06 DIAGNOSIS — E119 Type 2 diabetes mellitus without complications: Secondary | ICD-10-CM | POA: Diagnosis not present

## 2020-03-06 DIAGNOSIS — C3432 Malignant neoplasm of lower lobe, left bronchus or lung: Secondary | ICD-10-CM | POA: Diagnosis not present

## 2020-03-06 DIAGNOSIS — J9621 Acute and chronic respiratory failure with hypoxia: Secondary | ICD-10-CM | POA: Diagnosis not present

## 2020-03-06 DIAGNOSIS — J441 Chronic obstructive pulmonary disease with (acute) exacerbation: Secondary | ICD-10-CM | POA: Diagnosis not present

## 2020-03-06 DIAGNOSIS — M109 Gout, unspecified: Secondary | ICD-10-CM | POA: Diagnosis not present

## 2020-03-06 DIAGNOSIS — I119 Hypertensive heart disease without heart failure: Secondary | ICD-10-CM | POA: Diagnosis not present

## 2020-03-06 DIAGNOSIS — J9622 Acute and chronic respiratory failure with hypercapnia: Secondary | ICD-10-CM | POA: Diagnosis not present

## 2020-03-09 ENCOUNTER — Other Ambulatory Visit: Payer: Self-pay

## 2020-03-09 ENCOUNTER — Inpatient Hospital Stay: Payer: Medicare PPO

## 2020-03-09 VITALS — BP 151/93 | HR 98 | Temp 98.3°F | Resp 18

## 2020-03-09 DIAGNOSIS — C3491 Malignant neoplasm of unspecified part of right bronchus or lung: Secondary | ICD-10-CM

## 2020-03-09 DIAGNOSIS — J449 Chronic obstructive pulmonary disease, unspecified: Secondary | ICD-10-CM | POA: Diagnosis not present

## 2020-03-09 DIAGNOSIS — Z5111 Encounter for antineoplastic chemotherapy: Secondary | ICD-10-CM | POA: Diagnosis not present

## 2020-03-09 DIAGNOSIS — R5383 Other fatigue: Secondary | ICD-10-CM | POA: Diagnosis not present

## 2020-03-09 DIAGNOSIS — Z5112 Encounter for antineoplastic immunotherapy: Secondary | ICD-10-CM | POA: Diagnosis not present

## 2020-03-09 DIAGNOSIS — Z7689 Persons encountering health services in other specified circumstances: Secondary | ICD-10-CM | POA: Diagnosis not present

## 2020-03-09 DIAGNOSIS — C3411 Malignant neoplasm of upper lobe, right bronchus or lung: Secondary | ICD-10-CM

## 2020-03-09 DIAGNOSIS — F17210 Nicotine dependence, cigarettes, uncomplicated: Secondary | ICD-10-CM | POA: Diagnosis not present

## 2020-03-09 DIAGNOSIS — I119 Hypertensive heart disease without heart failure: Secondary | ICD-10-CM | POA: Diagnosis not present

## 2020-03-09 DIAGNOSIS — Z923 Personal history of irradiation: Secondary | ICD-10-CM | POA: Diagnosis not present

## 2020-03-09 LAB — CBC WITH DIFFERENTIAL (CANCER CENTER ONLY)
Abs Immature Granulocytes: 0.12 10*3/uL — ABNORMAL HIGH (ref 0.00–0.07)
Basophils Absolute: 0.1 10*3/uL (ref 0.0–0.1)
Basophils Relative: 1 %
Eosinophils Absolute: 0.2 10*3/uL (ref 0.0–0.5)
Eosinophils Relative: 2 %
HCT: 38.8 % — ABNORMAL LOW (ref 39.0–52.0)
Hemoglobin: 11.9 g/dL — ABNORMAL LOW (ref 13.0–17.0)
Immature Granulocytes: 1 %
Lymphocytes Relative: 22 %
Lymphs Abs: 2.9 10*3/uL (ref 0.7–4.0)
MCH: 30.4 pg (ref 26.0–34.0)
MCHC: 30.7 g/dL (ref 30.0–36.0)
MCV: 99.2 fL (ref 80.0–100.0)
Monocytes Absolute: 1.7 10*3/uL — ABNORMAL HIGH (ref 0.1–1.0)
Monocytes Relative: 13 %
Neutro Abs: 8.2 10*3/uL — ABNORMAL HIGH (ref 1.7–7.7)
Neutrophils Relative %: 61 %
Platelet Count: 251 10*3/uL (ref 150–400)
RBC: 3.91 MIL/uL — ABNORMAL LOW (ref 4.22–5.81)
RDW: 15.4 % (ref 11.5–15.5)
WBC Count: 13.3 10*3/uL — ABNORMAL HIGH (ref 4.0–10.5)
nRBC: 0 % (ref 0.0–0.2)

## 2020-03-09 LAB — CMP (CANCER CENTER ONLY)
ALT: 17 U/L (ref 0–44)
AST: 19 U/L (ref 15–41)
Albumin: 3.1 g/dL — ABNORMAL LOW (ref 3.5–5.0)
Alkaline Phosphatase: 91 U/L (ref 38–126)
Anion gap: 8 (ref 5–15)
BUN: 12 mg/dL (ref 8–23)
CO2: 33 mmol/L — ABNORMAL HIGH (ref 22–32)
Calcium: 8.6 mg/dL — ABNORMAL LOW (ref 8.9–10.3)
Chloride: 99 mmol/L (ref 98–111)
Creatinine: 0.85 mg/dL (ref 0.61–1.24)
GFR, Est AFR Am: >60 mL/min (ref >60)
GFR, Estimated: >60 mL/min (ref >60)
Glucose, Bld: 114 mg/dL — ABNORMAL HIGH (ref 70–99)
Potassium: 4.2 mmol/L (ref 3.5–5.1)
Sodium: 140 mmol/L (ref 135–145)
Total Bilirubin: 0.4 mg/dL (ref 0.3–1.2)
Total Protein: 7.4 g/dL (ref 6.5–8.1)

## 2020-03-09 LAB — TSH: TSH: 1.544 u[IU]/mL (ref 0.320–4.118)

## 2020-03-09 MED ORDER — SODIUM CHLORIDE 0.9 % IV SOLN
Freq: Once | INTRAVENOUS | Status: AC
  Filled 2020-03-09: qty 250

## 2020-03-09 MED ORDER — DEXAMETHASONE SODIUM PHOSPHATE 10 MG/ML IJ SOLN
INTRAMUSCULAR | Status: AC
  Filled 2020-03-09: qty 1

## 2020-03-09 MED ORDER — DIPHENHYDRAMINE HCL 50 MG/ML IJ SOLN
50.0000 mg | Freq: Once | INTRAMUSCULAR | Status: AC
  Administered 2020-03-09: 50 mg via INTRAVENOUS

## 2020-03-09 MED ORDER — DEXAMETHASONE SODIUM PHOSPHATE 10 MG/ML IJ SOLN
10.0000 mg | Freq: Once | INTRAMUSCULAR | Status: AC
  Administered 2020-03-09: 10 mg via INTRAVENOUS

## 2020-03-09 MED ORDER — SODIUM CHLORIDE 0.9 % IV SOLN
200.0000 mg | Freq: Once | INTRAVENOUS | Status: AC
  Administered 2020-03-09: 200 mg via INTRAVENOUS
  Filled 2020-03-09: qty 8

## 2020-03-09 MED ORDER — PALONOSETRON HCL INJECTION 0.25 MG/5ML
0.2500 mg | Freq: Once | INTRAVENOUS | Status: AC
  Administered 2020-03-09: 0.25 mg via INTRAVENOUS

## 2020-03-09 MED ORDER — SODIUM CHLORIDE 0.9 % IV SOLN
150.0000 mg | Freq: Once | INTRAVENOUS | Status: AC
  Administered 2020-03-09: 150 mg via INTRAVENOUS
  Filled 2020-03-09: qty 150

## 2020-03-09 MED ORDER — SODIUM CHLORIDE 0.9 % IV SOLN
175.0000 mg/m2 | Freq: Once | INTRAVENOUS | Status: AC
  Administered 2020-03-09: 402 mg via INTRAVENOUS
  Filled 2020-03-09: qty 67

## 2020-03-09 MED ORDER — PALONOSETRON HCL INJECTION 0.25 MG/5ML
INTRAVENOUS | Status: AC
  Filled 2020-03-09: qty 5

## 2020-03-09 MED ORDER — SODIUM CHLORIDE 0.9 % IV SOLN: 10.0000 mg | Freq: Once | INTRAVENOUS | Status: DC

## 2020-03-09 MED ORDER — FAMOTIDINE IN NACL 20-0.9 MG/50ML-% IV SOLN
20.0000 mg | Freq: Once | INTRAVENOUS | Status: AC
  Administered 2020-03-09: 20 mg via INTRAVENOUS

## 2020-03-09 MED ORDER — DIPHENHYDRAMINE HCL 50 MG/ML IJ SOLN
INTRAMUSCULAR | Status: AC
  Filled 2020-03-09: qty 1

## 2020-03-09 MED ORDER — FAMOTIDINE IN NACL 20-0.9 MG/50ML-% IV SOLN
INTRAVENOUS | Status: AC
  Filled 2020-03-09: qty 50

## 2020-03-09 MED ORDER — SODIUM CHLORIDE 0.9 % IV SOLN
584.0000 mg | Freq: Once | INTRAVENOUS | Status: AC
  Administered 2020-03-09: 580 mg via INTRAVENOUS
  Filled 2020-03-09: qty 58

## 2020-03-09 NOTE — Patient Instructions (Addendum)
[facility] Discharge Instructions for Patients Receiving Chemotherapy  Today you received the following chemotherapy agents: Keytruda, Taxol, Carboplatin  To help prevent nausea and vomiting after your treatment, we encourage you to take your nausea medication as directed.    If you develop nausea and vomiting that is not controlled by your nausea medication, call the clinic.   BELOW ARE SYMPTOMS THAT SHOULD BE REPORTED IMMEDIATELY:  *FEVER GREATER THAN 100.5 F  *CHILLS WITH OR WITHOUT FEVER  NAUSEA AND VOMITING THAT IS NOT CONTROLLED WITH YOUR NAUSEA MEDICATION  *UNUSUAL SHORTNESS OF BREATH  *UNUSUAL BRUISING OR BLEEDING  TENDERNESS IN MOUTH AND THROAT WITH OR WITHOUT PRESENCE OF ULCERS  *URINARY PROBLEMS  *BOWEL PROBLEMS  UNUSUAL RASH Items with * indicate a potential emergency and should be followed up as soon as possible.  Feel free to call the clinic should you have any questions or concerns. The clinic phone number is [phone].  Please show the [place] at check-in to the Emergency Department and triage nurse.  Pembrolizumab injection What is this medicine? PEMBROLIZUMAB (pem broe liz ue mab) is a monoclonal antibody. It is used to treat certain types of cancer. This medicine may be used for other purposes; ask your health care provider or pharmacist if you have questions. COMMON BRAND NAME(S): Keytruda What should I tell my health care provider before I take this medicine? They need to know if you have any of these conditions:  diabetes  immune system problems  inflammatory bowel disease  liver disease  lung or breathing disease  lupus  received or scheduled to receive an organ transplant or a stem-cell transplant that uses donor stem cells  an unusual or allergic reaction to pembrolizumab, other medicines, foods, dyes, or preservatives  pregnant or trying to get pregnant  breast-feeding How should I use this  medicine? This medicine is for infusion into a vein. It is given by a health care professional in a hospital or clinic setting. A special MedGuide will be given to you before each treatment. Be sure to read this information carefully each time. Talk to your pediatrician regarding the use of this medicine in children. While this drug may be prescribed for children as young as 6 months for selected conditions, precautions do apply. Overdosage: If you think you have taken too much of this medicine contact a poison control center or emergency room at once. NOTE: This medicine is only for you. Do not share this medicine with others. What if I miss a dose? It is important not to miss your dose. Call your doctor or health care professional if you are unable to keep an appointment. What may interact with this medicine? Interactions have not been studied. Give your health care provider a list of all the medicines, herbs, non-prescription drugs, or dietary supplements you use. Also tell them if you smoke, drink alcohol, or use illegal drugs. Some items may interact with your medicine. This list may not describe all possible interactions. Give your health care provider a list of all the medicines, herbs, non-prescription drugs, or dietary supplements you use. Also tell them if you smoke, drink alcohol, or use illegal drugs. Some items may interact with your medicine. What should I watch for while using this medicine? Your condition will be monitored carefully while you are receiving this medicine. You may need blood work done while you are taking this medicine. Do not become pregnant while taking this medicine or for 4 months after stopping  it. Women should inform their doctor if they wish to become pregnant or think they might be pregnant. There is a potential for serious side effects to an unborn child. Talk to your health care professional or pharmacist for more information. Do not breast-feed an infant while  taking this medicine or for 4 months after the last dose. What side effects may I notice from receiving this medicine? Side effects that you should report to your doctor or health care professional as soon as possible:  allergic reactions like skin rash, itching or hives, swelling of the face, lips, or tongue  bloody or black, tarry  breathing problems  changes in vision  chest pain  chills  confusion  constipation  cough  diarrhea  dizziness or feeling faint or lightheaded  fast or irregular heartbeat  fever  flushing  joint pain  low blood counts - this medicine may decrease the number of white blood cells, red blood cells and platelets. You may be at increased risk for infections and bleeding.  muscle pain  muscle weakness  pain, tingling, numbness in the hands or feet  persistent headache  redness, blistering, peeling or loosening of the skin, including inside the mouth  signs and symptoms of high blood sugar such as dizziness; dry mouth; dry skin; fruity breath; nausea; stomach pain; increased hunger or thirst; increased urination  signs and symptoms of kidney injury like trouble passing urine or change in the amount of urine  signs and symptoms of liver injury like dark urine, light-colored stools, loss of appetite, nausea, right upper belly pain, yellowing of the eyes or skin  sweating  swollen lymph nodes  weight loss Side effects that usually do not require medical attention (report to your doctor or health care professional if they continue or are bothersome):  decreased appetite  hair loss  muscle pain  tiredness This list may not describe all possible side effects. Call your doctor for medical advice about side effects. You may report side effects to FDA at 1-800-FDA-1088. Where should I keep my medicine? This drug is given in a hospital or clinic and will not be stored at home. NOTE: This sheet is a summary. It may not cover all  possible information. If you have questions about this medicine, talk to your doctor, pharmacist, or health care provider.  2020 Elsevier/Gold Standard (2019-10-04 18:07:58)  Paclitaxel injection What is this medicine? PACLITAXEL (PAK li TAX el) is a chemotherapy drug. It targets fast dividing cells, like cancer cells, and causes these cells to die. This medicine is used to treat ovarian cancer, breast cancer, lung cancer, Kaposi's sarcoma, and other cancers. This medicine may be used for other purposes; ask your health care provider or pharmacist if you have questions. COMMON BRAND NAME(S): Onxol, Taxol What should I tell my health care provider before I take this medicine? They need to know if you have any of these conditions:  history of irregular heartbeat  liver disease  low blood counts, like low white cell, platelet, or red cell counts  lung or breathing disease, like asthma  tingling of the fingers or toes, or other nerve disorder  an unusual or allergic reaction to paclitaxel, alcohol, polyoxyethylated castor oil, other chemotherapy, other medicines, foods, dyes, or preservatives  pregnant or trying to get pregnant  breast-feeding How should I use this medicine? This drug is given as an infusion into a vein. It is administered in a hospital or clinic by a specially trained health care professional. Talk  to your pediatrician regarding the use of this medicine in children. Special care may be needed. Overdosage: If you think you have taken too much of this medicine contact a poison control center or emergency room at once. NOTE: This medicine is only for you. Do not share this medicine with others. What if I miss a dose? It is important not to miss your dose. Call your doctor or health care professional if you are unable to keep an appointment. What may interact with this medicine? Do not take this medicine with any of the following  medications:  disulfiram  metronidazole This medicine may also interact with the following medications:  antiviral medicines for hepatitis, HIV or AIDS  certain antibiotics like erythromycin and clarithromycin  certain medicines for fungal infections like ketoconazole and itraconazole  certain medicines for seizures like carbamazepine, phenobarbital, phenytoin  gemfibrozil  nefazodone  rifampin  St. John's wort This list may not describe all possible interactions. Give your health care provider a list of all the medicines, herbs, non-prescription drugs, or dietary supplements you use. Also tell them if you smoke, drink alcohol, or use illegal drugs. Some items may interact with your medicine. What should I watch for while using this medicine? Your condition will be monitored carefully while you are receiving this medicine. You will need important blood work done while you are taking this medicine. This medicine can cause serious allergic reactions. To reduce your risk you will need to take other medicine(s) before treatment with this medicine. If you experience allergic reactions like skin rash, itching or hives, swelling of the face, lips, or tongue, tell your doctor or health care professional right away. In some cases, you may be given additional medicines to help with side effects. Follow all directions for their use. This drug may make you feel generally unwell. This is not uncommon, as chemotherapy can affect healthy cells as well as cancer cells. Report any side effects. Continue your course of treatment even though you feel ill unless your doctor tells you to stop. Call your doctor or health care professional for advice if you get a fever, chills or sore throat, or other symptoms of a cold or flu. Do not treat yourself. This drug decreases your body's ability to fight infections. Try to avoid being around people who are sick. This medicine may increase your risk to bruise or  bleed. Call your doctor or health care professional if you notice any unusual bleeding. Be careful brushing and flossing your teeth or using a toothpick because you may get an infection or bleed more easily. If you have any dental work done, tell your dentist you are receiving this medicine. Avoid taking products that contain aspirin, acetaminophen, ibuprofen, naproxen, or ketoprofen unless instructed by your doctor. These medicines may hide a fever. Do not become pregnant while taking this medicine. Women should inform their doctor if they wish to become pregnant or think they might be pregnant. There is a potential for serious side effects to an unborn child. Talk to your health care professional or pharmacist for more information. Do not breast-feed an infant while taking this medicine. Men are advised not to father a child while receiving this medicine. This product may contain alcohol. Ask your pharmacist or healthcare provider if this medicine contains alcohol. Be sure to tell all healthcare providers you are taking this medicine. Certain medicines, like metronidazole and disulfiram, can cause an unpleasant reaction when taken with alcohol. The reaction includes flushing, headache, nausea, vomiting, sweating,  and increased thirst. The reaction can last from 30 minutes to several hours. What side effects may I notice from receiving this medicine? Side effects that you should report to your doctor or health care professional as soon as possible:  allergic reactions like skin rash, itching or hives, swelling of the face, lips, or tongue  breathing problems  changes in vision  fast, irregular heartbeat  high or low blood pressure  mouth sores  pain, tingling, numbness in the hands or feet  signs of decreased platelets or bleeding - bruising, pinpoint red spots on the skin, black, tarry stools, blood in the urine  signs of decreased red blood cells - unusually weak or tired, feeling faint  or lightheaded, falls  signs of infection - fever or chills, cough, sore throat, pain or difficulty passing urine  signs and symptoms of liver injury like dark yellow or brown urine; general ill feeling or flu-like symptoms; light-colored stools; loss of appetite; nausea; right upper belly pain; unusually weak or tired; yellowing of the eyes or skin  swelling of the ankles, feet, hands  unusually slow heartbeat Side effects that usually do not require medical attention (report to your doctor or health care professional if they continue or are bothersome):  diarrhea  hair loss  loss of appetite  muscle or joint pain  nausea, vomiting  pain, redness, or irritation at site where injected  tiredness This list may not describe all possible side effects. Call your doctor for medical advice about side effects. You may report side effects to FDA at 1-800-FDA-1088. Where should I keep my medicine? This drug is given in a hospital or clinic and will not be stored at home. NOTE: This sheet is a summary. It may not cover all possible information. If you have questions about this medicine, talk to your doctor, pharmacist, or health care provider.  2020 Elsevier/Gold Standard (2017-08-01 13:14:55)  Carboplatin injection What is this medicine? CARBOPLATIN (KAR boe pla tin) is a chemotherapy drug. It targets fast dividing cells, like cancer cells, and causes these cells to die. This medicine is used to treat ovarian cancer and many other cancers. This medicine may be used for other purposes; ask your health care provider or pharmacist if you have questions. COMMON BRAND NAME(S): Paraplatin What should I tell my health care provider before I take this medicine? They need to know if you have any of these conditions:  blood disorders  hearing problems  kidney disease  recent or ongoing radiation therapy  an unusual or allergic reaction to carboplatin, cisplatin, other chemotherapy, other  medicines, foods, dyes, or preservatives  pregnant or trying to get pregnant  breast-feeding How should I use this medicine? This drug is usually given as an infusion into a vein. It is administered in a hospital or clinic by a specially trained health care professional. Talk to your pediatrician regarding the use of this medicine in children. Special care may be needed. Overdosage: If you think you have taken too much of this medicine contact a poison control center or emergency room at once. NOTE: This medicine is only for you. Do not share this medicine with others. What if I miss a dose? It is important not to miss a dose. Call your doctor or health care professional if you are unable to keep an appointment. What may interact with this medicine?  medicines for seizures  medicines to increase blood counts like filgrastim, pegfilgrastim, sargramostim  some antibiotics like amikacin, gentamicin, neomycin, streptomycin, tobramycin  vaccines Talk to your doctor or health care professional before taking any of these medicines:  acetaminophen  aspirin  ibuprofen  ketoprofen  naproxen This list may not describe all possible interactions. Give your health care provider a list of all the medicines, herbs, non-prescription drugs, or dietary supplements you use. Also tell them if you smoke, drink alcohol, or use illegal drugs. Some items may interact with your medicine. What should I watch for while using this medicine? Your condition will be monitored carefully while you are receiving this medicine. You will need important blood work done while you are taking this medicine. This drug may make you feel generally unwell. This is not uncommon, as chemotherapy can affect healthy cells as well as cancer cells. Report any side effects. Continue your course of treatment even though you feel ill unless your doctor tells you to stop. In some cases, you may be given additional medicines to help  with side effects. Follow all directions for their use. Call your doctor or health care professional for advice if you get a fever, chills or sore throat, or other symptoms of a cold or flu. Do not treat yourself. This drug decreases your body's ability to fight infections. Try to avoid being around people who are sick. This medicine may increase your risk to bruise or bleed. Call your doctor or health care professional if you notice any unusual bleeding. Be careful brushing and flossing your teeth or using a toothpick because you may get an infection or bleed more easily. If you have any dental work done, tell your dentist you are receiving this medicine. Avoid taking products that contain aspirin, acetaminophen, ibuprofen, naproxen, or ketoprofen unless instructed by your doctor. These medicines may hide a fever. Do not become pregnant while taking this medicine. Women should inform their doctor if they wish to become pregnant or think they might be pregnant. There is a potential for serious side effects to an unborn child. Talk to your health care professional or pharmacist for more information. Do not breast-feed an infant while taking this medicine. What side effects may I notice from receiving this medicine? Side effects that you should report to your doctor or health care professional as soon as possible:  allergic reactions like skin rash, itching or hives, swelling of the face, lips, or tongue  signs of infection - fever or chills, cough, sore throat, pain or difficulty passing urine  signs of decreased platelets or bleeding - bruising, pinpoint red spots on the skin, black, tarry stools, nosebleeds  signs of decreased red blood cells - unusually weak or tired, fainting spells, lightheadedness  breathing problems  changes in hearing  changes in vision  chest pain  high blood pressure  low blood counts - This drug may decrease the number of white blood cells, red blood cells and  platelets. You may be at increased risk for infections and bleeding.  nausea and vomiting  pain, swelling, redness or irritation at the injection site  pain, tingling, numbness in the hands or feet  problems with balance, talking, walking  trouble passing urine or change in the amount of urine Side effects that usually do not require medical attention (report to your doctor or health care professional if they continue or are bothersome):  hair loss  loss of appetite  metallic taste in the mouth or changes in taste This list may not describe all possible side effects. Call your doctor for medical advice about side effects. You may report side  effects to FDA at 1-800-FDA-1088. Where should I keep my medicine? This drug is given in a hospital or clinic and will not be stored at home. NOTE: This sheet is a summary. It may not cover all possible information. If you have questions about this medicine, talk to your doctor, pharmacist, or health care provider.  2020 Elsevier/Gold Standard (2008-03-04 14:38:05)

## 2020-03-10 ENCOUNTER — Telehealth: Payer: Self-pay | Admitting: *Deleted

## 2020-03-10 ENCOUNTER — Other Ambulatory Visit: Payer: Self-pay | Admitting: Radiology

## 2020-03-10 ENCOUNTER — Other Ambulatory Visit: Payer: Self-pay | Admitting: Emergency Medicine

## 2020-03-10 DIAGNOSIS — J9622 Acute and chronic respiratory failure with hypercapnia: Secondary | ICD-10-CM | POA: Diagnosis not present

## 2020-03-10 DIAGNOSIS — I119 Hypertensive heart disease without heart failure: Secondary | ICD-10-CM | POA: Diagnosis not present

## 2020-03-10 DIAGNOSIS — J9621 Acute and chronic respiratory failure with hypoxia: Secondary | ICD-10-CM | POA: Diagnosis not present

## 2020-03-10 DIAGNOSIS — J441 Chronic obstructive pulmonary disease with (acute) exacerbation: Secondary | ICD-10-CM | POA: Diagnosis not present

## 2020-03-10 DIAGNOSIS — E119 Type 2 diabetes mellitus without complications: Secondary | ICD-10-CM | POA: Diagnosis not present

## 2020-03-10 DIAGNOSIS — C3432 Malignant neoplasm of lower lobe, left bronchus or lung: Secondary | ICD-10-CM | POA: Diagnosis not present

## 2020-03-10 DIAGNOSIS — C3411 Malignant neoplasm of upper lobe, right bronchus or lung: Secondary | ICD-10-CM | POA: Diagnosis not present

## 2020-03-10 DIAGNOSIS — I2510 Atherosclerotic heart disease of native coronary artery without angina pectoris: Secondary | ICD-10-CM | POA: Diagnosis not present

## 2020-03-10 DIAGNOSIS — M109 Gout, unspecified: Secondary | ICD-10-CM | POA: Diagnosis not present

## 2020-03-10 NOTE — Telephone Encounter (Signed)
appts confirmed this week.

## 2020-03-11 ENCOUNTER — Telehealth: Payer: Self-pay | Admitting: Medical Oncology

## 2020-03-11 ENCOUNTER — Inpatient Hospital Stay: Payer: Medicare PPO

## 2020-03-11 ENCOUNTER — Other Ambulatory Visit: Payer: Self-pay

## 2020-03-11 VITALS — BP 127/105 | HR 94 | Temp 98.2°F | Resp 18

## 2020-03-11 DIAGNOSIS — Z5111 Encounter for antineoplastic chemotherapy: Secondary | ICD-10-CM | POA: Diagnosis not present

## 2020-03-11 DIAGNOSIS — J449 Chronic obstructive pulmonary disease, unspecified: Secondary | ICD-10-CM | POA: Diagnosis not present

## 2020-03-11 DIAGNOSIS — C3491 Malignant neoplasm of unspecified part of right bronchus or lung: Secondary | ICD-10-CM

## 2020-03-11 DIAGNOSIS — Z5112 Encounter for antineoplastic immunotherapy: Secondary | ICD-10-CM | POA: Diagnosis not present

## 2020-03-11 DIAGNOSIS — C3411 Malignant neoplasm of upper lobe, right bronchus or lung: Secondary | ICD-10-CM

## 2020-03-11 DIAGNOSIS — Z7689 Persons encountering health services in other specified circumstances: Secondary | ICD-10-CM | POA: Diagnosis not present

## 2020-03-11 DIAGNOSIS — R5383 Other fatigue: Secondary | ICD-10-CM | POA: Diagnosis not present

## 2020-03-11 DIAGNOSIS — I119 Hypertensive heart disease without heart failure: Secondary | ICD-10-CM | POA: Diagnosis not present

## 2020-03-11 DIAGNOSIS — Z923 Personal history of irradiation: Secondary | ICD-10-CM | POA: Diagnosis not present

## 2020-03-11 DIAGNOSIS — F17210 Nicotine dependence, cigarettes, uncomplicated: Secondary | ICD-10-CM | POA: Diagnosis not present

## 2020-03-11 MED ORDER — PEGFILGRASTIM-JMDB 6 MG/0.6ML ~~LOC~~ SOSY
PREFILLED_SYRINGE | SUBCUTANEOUS | Status: AC
  Filled 2020-03-11: qty 0.6

## 2020-03-11 MED ORDER — PEGFILGRASTIM-JMDB 6 MG/0.6ML ~~LOC~~ SOSY
6.0000 mg | PREFILLED_SYRINGE | Freq: Once | SUBCUTANEOUS | Status: AC
  Administered 2020-03-11: 6 mg via SUBCUTANEOUS

## 2020-03-11 NOTE — Telephone Encounter (Signed)
Pt given information about Claritin and how to take it.

## 2020-03-11 NOTE — Patient Instructions (Signed)

## 2020-03-11 NOTE — Telephone Encounter (Signed)
Barbara instructed to call IR to schedule port a cath procedure.

## 2020-03-12 ENCOUNTER — Inpatient Hospital Stay (HOSPITAL_COMMUNITY): Admission: RE | Admit: 2020-03-12 | Payer: Medicare PPO | Source: Ambulatory Visit

## 2020-03-12 ENCOUNTER — Ambulatory Visit (HOSPITAL_COMMUNITY): Payer: Medicare PPO

## 2020-03-12 DIAGNOSIS — C3432 Malignant neoplasm of lower lobe, left bronchus or lung: Secondary | ICD-10-CM | POA: Diagnosis not present

## 2020-03-12 DIAGNOSIS — C3411 Malignant neoplasm of upper lobe, right bronchus or lung: Secondary | ICD-10-CM | POA: Diagnosis not present

## 2020-03-12 DIAGNOSIS — J9621 Acute and chronic respiratory failure with hypoxia: Secondary | ICD-10-CM | POA: Diagnosis not present

## 2020-03-12 DIAGNOSIS — I119 Hypertensive heart disease without heart failure: Secondary | ICD-10-CM | POA: Diagnosis not present

## 2020-03-12 DIAGNOSIS — J441 Chronic obstructive pulmonary disease with (acute) exacerbation: Secondary | ICD-10-CM | POA: Diagnosis not present

## 2020-03-12 DIAGNOSIS — E119 Type 2 diabetes mellitus without complications: Secondary | ICD-10-CM | POA: Diagnosis not present

## 2020-03-12 DIAGNOSIS — J9622 Acute and chronic respiratory failure with hypercapnia: Secondary | ICD-10-CM | POA: Diagnosis not present

## 2020-03-12 DIAGNOSIS — I2510 Atherosclerotic heart disease of native coronary artery without angina pectoris: Secondary | ICD-10-CM | POA: Diagnosis not present

## 2020-03-12 DIAGNOSIS — M109 Gout, unspecified: Secondary | ICD-10-CM | POA: Diagnosis not present

## 2020-03-13 DIAGNOSIS — I119 Hypertensive heart disease without heart failure: Secondary | ICD-10-CM | POA: Diagnosis not present

## 2020-03-13 DIAGNOSIS — J441 Chronic obstructive pulmonary disease with (acute) exacerbation: Secondary | ICD-10-CM | POA: Diagnosis not present

## 2020-03-13 DIAGNOSIS — J9621 Acute and chronic respiratory failure with hypoxia: Secondary | ICD-10-CM | POA: Diagnosis not present

## 2020-03-13 DIAGNOSIS — J9622 Acute and chronic respiratory failure with hypercapnia: Secondary | ICD-10-CM | POA: Diagnosis not present

## 2020-03-13 DIAGNOSIS — C3411 Malignant neoplasm of upper lobe, right bronchus or lung: Secondary | ICD-10-CM | POA: Diagnosis not present

## 2020-03-13 DIAGNOSIS — I2510 Atherosclerotic heart disease of native coronary artery without angina pectoris: Secondary | ICD-10-CM | POA: Diagnosis not present

## 2020-03-13 DIAGNOSIS — E119 Type 2 diabetes mellitus without complications: Secondary | ICD-10-CM | POA: Diagnosis not present

## 2020-03-13 DIAGNOSIS — C3432 Malignant neoplasm of lower lobe, left bronchus or lung: Secondary | ICD-10-CM | POA: Diagnosis not present

## 2020-03-13 DIAGNOSIS — M109 Gout, unspecified: Secondary | ICD-10-CM | POA: Diagnosis not present

## 2020-03-17 ENCOUNTER — Telehealth: Payer: Self-pay | Admitting: Emergency Medicine

## 2020-03-17 ENCOUNTER — Inpatient Hospital Stay: Payer: Medicare PPO | Admitting: Internal Medicine

## 2020-03-17 ENCOUNTER — Ambulatory Visit (INDEPENDENT_AMBULATORY_CARE_PROVIDER_SITE_OTHER): Payer: Medicare PPO | Admitting: Emergency Medicine

## 2020-03-17 ENCOUNTER — Other Ambulatory Visit: Payer: Self-pay

## 2020-03-17 ENCOUNTER — Inpatient Hospital Stay: Payer: Medicare PPO

## 2020-03-17 ENCOUNTER — Encounter: Payer: Self-pay | Admitting: Emergency Medicine

## 2020-03-17 DIAGNOSIS — I119 Hypertensive heart disease without heart failure: Secondary | ICD-10-CM | POA: Diagnosis not present

## 2020-03-17 DIAGNOSIS — C3411 Malignant neoplasm of upper lobe, right bronchus or lung: Secondary | ICD-10-CM | POA: Diagnosis not present

## 2020-03-17 DIAGNOSIS — I2510 Atherosclerotic heart disease of native coronary artery without angina pectoris: Secondary | ICD-10-CM | POA: Diagnosis not present

## 2020-03-17 DIAGNOSIS — M109 Gout, unspecified: Secondary | ICD-10-CM | POA: Diagnosis not present

## 2020-03-17 DIAGNOSIS — J309 Allergic rhinitis, unspecified: Secondary | ICD-10-CM | POA: Insufficient documentation

## 2020-03-17 DIAGNOSIS — J449 Chronic obstructive pulmonary disease, unspecified: Secondary | ICD-10-CM | POA: Diagnosis not present

## 2020-03-17 DIAGNOSIS — J301 Allergic rhinitis due to pollen: Secondary | ICD-10-CM | POA: Diagnosis not present

## 2020-03-17 DIAGNOSIS — J9621 Acute and chronic respiratory failure with hypoxia: Secondary | ICD-10-CM | POA: Diagnosis not present

## 2020-03-17 DIAGNOSIS — E119 Type 2 diabetes mellitus without complications: Secondary | ICD-10-CM | POA: Diagnosis not present

## 2020-03-17 DIAGNOSIS — C3432 Malignant neoplasm of lower lobe, left bronchus or lung: Secondary | ICD-10-CM | POA: Diagnosis not present

## 2020-03-17 DIAGNOSIS — J441 Chronic obstructive pulmonary disease with (acute) exacerbation: Secondary | ICD-10-CM | POA: Diagnosis not present

## 2020-03-17 DIAGNOSIS — J9622 Acute and chronic respiratory failure with hypercapnia: Secondary | ICD-10-CM | POA: Diagnosis not present

## 2020-03-17 NOTE — Progress Notes (Signed)
Subjective:    Patient ID: Ryan Contreras, male    DOB: 1941/11/28, 79 y.o.   MRN: [id number]  HPI   ROV 02/10/2020 --79 year old man with COPD and hypoxemic respiratory failure, recurrent squamous cell lung cancer being followed by Dr. Earlie Server and posterior tactic radiation therapy.  His most recent screening CT was done 01/20/2020 which showed an enlarging right upper lobe rounded opacity concerning for recurrence.  PET scan on 2/24 reviewed by me shows intense hypermetabolism of the enlarging lesion and and the parabronchial soft tissue adjacent to the bronchus intermedius.  He is here to discuss these results and plan next steps. He reports that he has been having some hemoptysis since last week. His breathing is probably progressively worse. He did not bring his oxygen with him today - poor compliance. He is desaturated today walking into the office. He is on trelegy, albuterol - uses about 1-2x a day.   His sister [name] is his family contact.   ROV 02/18/2020 --79 year old man with significant COPD and associated hypoxemic respiratory failure, recurrent squamous cell lung cancer being followed by Dr. Julien Nordmann post stereotactic radiation therapy.  He now has an enlarging right upper lobe rounded opacity concerning for recurrence, hypermetabolic on PET scan with some associated adjacent soft tissue opacity in the bronchus intermedius.  I have him scheduled for navigational bronchoscopy and endobronchial ultrasound on 3/16.  He is on Plavix and will need to stop this at least 5 days prior to the procedure.  He was desaturated when he arrived, was not wearing his oxygen, has had poor compliance in the past. He tells me that his sister will be able to accompany him for procedure next week. Remains on trelegy, albuterol 1-2x a day. No longer on prednisone for flare in February. Breathing is stable, still has exertional SOB with minimal workload.   ROV 03/17/20 --79 year old man with COPD and hypoxemia,  recurrent squamous cell lung cancer followed by Dr. Julien Nordmann, treated with stereotactic radiation therapy.  He underwent bronchoscopy on 3/16.  He had an endobronchial lesion, biopsied and consistent with cell lung cancer.  Dr Julien Nordmann planning for systemic chemotherapy - he has had one round.  His Plavix has been restarted - planning to hold again for port placement.  Bronchodilator regimen includes Trelegy, albuterol which he uses about 2x a day. On 2L/min - has been more reliably. He has had both COVID vaccines. Dealing with nausea, no emesis. He had some hemoptysis post-FOB, now resolved. Having more nasal congestion.   MDM:  Reviewed notes from Dr. Julien Nordmann, oncology Reviewed pathology from bronchoscopy 3/16.             Review of Systems As per HPI     Objective:   Physical Exam Vitals:   03/17/20 0916  BP: 130/62  Pulse: (!) 117  Temp: (!) 97.3 F (36.3 C)  TempSrc: Temporal  SpO2: 100%  Weight: 228 lb (103.4 kg)  Height: 6\' 1"  (1.854 m)   Gen: Pleasant, well-nourished, in no distress,  normal affect  ENT: No lesions,  mouth clear,  oropharynx clear, poor dentition.  Some nasal congestion, some whistling through his nose on inspiration with apparent mild obstruction  Neck: No JVD, no stridor  Lungs: No use of accessory muscles, inspiratory rhonchi on the right, otherwise distant but clear  Cardiovascular: RRR, heart sounds normal, no murmur or gallops, trace peripheral edema  Musculoskeletal: No deformities, no cyanosis or clubbing  Neuro: alert, non focal  Skin: Warm, no lesions  or rash     Assessment & Plan:  COPD  GOLD C Please continue Trelegy as you have been taking it once daily.  Rinse and gargle after using. Keep your albuterol available to use 2 puffs if needed for shortness of breath, chest tightness, wheezing. Wear your oxygen at 2 L/min at all times.  We will send a message to your DME company so that they can check that your concentrator is working  appropriately, that your battery charges appropriately. COVID-19 vaccine is up-to-date Follow with Dr Lamonte Sakai in 3 months or sooner if you have any problems.  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  Has just started systemic therapy with Dr. Julien Nordmann.  Minimal side effects reported.  Encouraged him to continue to follow with oncology, get his surveillance CT scan as planned.  Allergic rhinitis Discussed increased symptoms with him today.  He has some nasal obstruction today as well.  Difficulty triggering his pulse flow POC.  He will start loratadine, consider adding fluticasone nasal spray at some point going forward.  Baltazar Apo, MD, PhD 03/17/2020, 9:37 AM [facility] [phone] or if no answer [phone]

## 2020-03-17 NOTE — Telephone Encounter (Signed)
We had planned to start him on fluticasone nasal spray, qd

## 2020-03-17 NOTE — Telephone Encounter (Signed)
Spoke with patient. He had a visit with RB today. He was under the impression that RB would calling in a medication for him. I reviewed his AVS over the phone with him. He stated that he already has the Trelegy and Claritin. He thought that another medication would be called in. He can't remember the name of the medication.   RB, either than the Trelegy and Claritin, do you remember another medication you had discussed with him? Thanks!

## 2020-03-17 NOTE — Telephone Encounter (Signed)
Spoke with patient. Offered to call in the [facility] for him but he stated that he would like to hold off on it for now. Advised him to call us back if he changes his mind. He verbalized understanding.   Nothing further needed at time of call.

## 2020-03-17 NOTE — Assessment & Plan Note (Signed)
Discussed increased symptoms with him today.  He has some nasal obstruction today as well.  Difficulty triggering his pulse flow POC.  He will start loratadine, consider adding fluticasone nasal spray at some point going forward.

## 2020-03-17 NOTE — Patient Instructions (Addendum)
Please continue Trelegy as you have been taking it once daily.  Rinse and gargle after using. Keep your albuterol available to use 2 puffs if needed for shortness of breath, chest tightness, wheezing. Wear your oxygen at 2 L/min at all times.  We will send a message to your DME company so that they can check that your concentrator is working appropriately, that your battery charges appropriately. Continue to follow with Dr. Julien Nordmann for your treatments and to follow your CT scans of the chest COVID-19 vaccine is up-to-date Please start using loratadine 10 mg (Claritin) once daily for your nasal congestion and allergy symptoms. Follow with Dr Lamonte Sakai in 3 months or sooner if you have any problems.

## 2020-03-17 NOTE — Assessment & Plan Note (Signed)
Please continue Trelegy as you have been taking it once daily.  Rinse and gargle after using. Keep your albuterol available to use 2 puffs if needed for shortness of breath, chest tightness, wheezing. Wear your oxygen at 2 L/min at all times.  We will send a message to your DME company so that they can check that your concentrator is working appropriately, that your battery charges appropriately. COVID-19 vaccine is up-to-date Follow with Dr Lamonte Sakai in 3 months or sooner if you have any problems.

## 2020-03-17 NOTE — Assessment & Plan Note (Signed)
Recurrent.  Has just started systemic therapy with Dr. Julien Nordmann.  Minimal side effects reported.  Encouraged him to continue to follow with oncology, get his surveillance CT scan as planned.

## 2020-03-19 ENCOUNTER — Inpatient Hospital Stay: Payer: Medicare PPO | Attending: Internal Medicine

## 2020-03-19 ENCOUNTER — Other Ambulatory Visit: Payer: Self-pay

## 2020-03-19 ENCOUNTER — Inpatient Hospital Stay (HOSPITAL_BASED_OUTPATIENT_CLINIC_OR_DEPARTMENT_OTHER): Payer: Medicare PPO | Admitting: Internal Medicine

## 2020-03-19 ENCOUNTER — Encounter: Payer: Self-pay | Admitting: Internal Medicine

## 2020-03-19 VITALS — BP 119/56 | HR 111 | Temp 98.5°F | Resp 17 | Ht 73.0 in | Wt 227.8 lb

## 2020-03-19 DIAGNOSIS — C3491 Malignant neoplasm of unspecified part of right bronchus or lung: Secondary | ICD-10-CM | POA: Diagnosis not present

## 2020-03-19 DIAGNOSIS — E119 Type 2 diabetes mellitus without complications: Secondary | ICD-10-CM | POA: Insufficient documentation

## 2020-03-19 DIAGNOSIS — Z5112 Encounter for antineoplastic immunotherapy: Secondary | ICD-10-CM

## 2020-03-19 DIAGNOSIS — Z5111 Encounter for antineoplastic chemotherapy: Secondary | ICD-10-CM

## 2020-03-19 DIAGNOSIS — I119 Hypertensive heart disease without heart failure: Secondary | ICD-10-CM | POA: Insufficient documentation

## 2020-03-19 DIAGNOSIS — M109 Gout, unspecified: Secondary | ICD-10-CM | POA: Diagnosis not present

## 2020-03-19 DIAGNOSIS — I2510 Atherosclerotic heart disease of native coronary artery without angina pectoris: Secondary | ICD-10-CM | POA: Insufficient documentation

## 2020-03-19 DIAGNOSIS — J9621 Acute and chronic respiratory failure with hypoxia: Secondary | ICD-10-CM | POA: Diagnosis not present

## 2020-03-19 DIAGNOSIS — J9622 Acute and chronic respiratory failure with hypercapnia: Secondary | ICD-10-CM | POA: Diagnosis not present

## 2020-03-19 DIAGNOSIS — C3411 Malignant neoplasm of upper lobe, right bronchus or lung: Secondary | ICD-10-CM | POA: Insufficient documentation

## 2020-03-19 DIAGNOSIS — Z79899 Other long term (current) drug therapy: Secondary | ICD-10-CM | POA: Diagnosis not present

## 2020-03-19 DIAGNOSIS — J441 Chronic obstructive pulmonary disease with (acute) exacerbation: Secondary | ICD-10-CM | POA: Diagnosis not present

## 2020-03-19 DIAGNOSIS — C3432 Malignant neoplasm of lower lobe, left bronchus or lung: Secondary | ICD-10-CM | POA: Diagnosis not present

## 2020-03-19 LAB — CMP (CANCER CENTER ONLY)
ALT: 29 U/L (ref 0–44)
AST: 31 U/L (ref 15–41)
Albumin: 3.3 g/dL — ABNORMAL LOW (ref 3.5–5.0)
Alkaline Phosphatase: 165 U/L — ABNORMAL HIGH (ref 38–126)
Anion gap: 8 (ref 5–15)
BUN: 11 mg/dL (ref 8–23)
CO2: 33 mmol/L — ABNORMAL HIGH (ref 22–32)
Calcium: 8.6 mg/dL — ABNORMAL LOW (ref 8.9–10.3)
Chloride: 100 mmol/L (ref 98–111)
Creatinine: 0.84 mg/dL (ref 0.61–1.24)
GFR, Est AFR Am: >60 mL/min (ref >60)
GFR, Estimated: >60 mL/min (ref >60)
Glucose, Bld: 160 mg/dL — ABNORMAL HIGH (ref 70–99)
Potassium: 4.3 mmol/L (ref 3.5–5.1)
Sodium: 141 mmol/L (ref 135–145)
Total Bilirubin: <0.2 mg/dL — ABNORMAL LOW (ref 0.3–1.2)
Total Protein: 7.2 g/dL (ref 6.5–8.1)

## 2020-03-19 LAB — CBC WITH DIFFERENTIAL (CANCER CENTER ONLY)
Abs Immature Granulocytes: 6.7 10*3/uL — ABNORMAL HIGH (ref 0.00–0.07)
Basophils Absolute: 0.1 10*3/uL (ref 0.0–0.1)
Basophils Relative: 0 %
Eosinophils Absolute: 0.2 10*3/uL (ref 0.0–0.5)
Eosinophils Relative: 1 %
HCT: 37 % — ABNORMAL LOW (ref 39.0–52.0)
Hemoglobin: 11.2 g/dL — ABNORMAL LOW (ref 13.0–17.0)
Immature Granulocytes: 18 %
Lymphocytes Relative: 10 %
Lymphs Abs: 3.9 10*3/uL (ref 0.7–4.0)
MCH: 30.5 pg (ref 26.0–34.0)
MCHC: 30.3 g/dL (ref 30.0–36.0)
MCV: 100.8 fL — ABNORMAL HIGH (ref 80.0–100.0)
Monocytes Absolute: 3.2 10*3/uL — ABNORMAL HIGH (ref 0.1–1.0)
Monocytes Relative: 8 %
Neutro Abs: 24.1 10*3/uL — ABNORMAL HIGH (ref 1.7–7.7)
Neutrophils Relative %: 63 %
Platelet Count: 174 10*3/uL (ref 150–400)
RBC: 3.67 MIL/uL — ABNORMAL LOW (ref 4.22–5.81)
RDW: 16.1 % — ABNORMAL HIGH (ref 11.5–15.5)
WBC Count: 38.1 10*3/uL — ABNORMAL HIGH (ref 4.0–10.5)
nRBC: 1.3 % — ABNORMAL HIGH (ref 0.0–0.2)

## 2020-03-19 NOTE — Progress Notes (Signed)
[place] [phone]   [phone]  OFFICE PROGRESS NOTE  Cyndi Bender, PA-C [address] [place] [address]  DIAGNOSIS: Recurrent non-small cell lung cancer, squamous cell carcinoma initially presented as unresectable Stage IIA (T1b, N1, M0) non-small cell lung cancer, squamous cell carcinoma presented with right upper lobe lung nodule and hilar lymphadenopathy diagnosed in January 2017.  PRIOR THERAPY:  1) Course of concurrent chemoradiation with weekly carboplatin for AUC of 2 and paclitaxel 45 MG/M2. Status post 7 cycles, last dose was given 03/14/2016 with partial response. 2) status post stereotactic radiotherapy to recurrent lung cancer in the right upper lobe under the care of Dr. Lisbeth Renshaw completed on 06/08/2018  CURRENT THERAPY: Systemic chemotherapy with carboplatin for AUC of 5, paclitaxel 175 mg/M2 and Keytruda 200 mg IV every 3 weeks with Neulasta support.  First cycle March 10, 2020.  Status post 1 cycle.  INTERVAL HISTORY: Ryan Contreras 79 y.o. male returns to the clinic today for follow-up visit accompanied by his wife.  The patient is feeling fine today with no concerning complaints except for the baseline shortness of breath and he is currently on home oxygen secondary to COPD.  He denied having any current chest pain, but has mild cough with no hemoptysis.  He denied having any nausea, vomiting or diarrhea but has occasional constipation and he takes MiraLAX for the constipation.  He has no recent weight loss or night sweats.  He has no headache or visual changes.  He tolerated the first week of his systemic chemotherapy fairly well.  The patient is here today for evaluation and repeat blood work.  MEDICAL HISTORY: Past Medical History:  Diagnosis Date  . Arthritis   . CAD, NATIVE VESSEL    coronary stent x1-tx. Plavix use.  Marland Kitchen COPD (chronic obstructive pulmonary disease) ([place])    due to 50+ years Smoking.  Marland Kitchen Dyspnea    wears oxygen PRN  . Gastrointestinal bleeding, upper 04/29/2016  . Gout, unspecified    foot  . HYPERLIPIDEMIA   . HYPERTENSION   . Lung mass   . NSCL ca dx'd 12/2015   Lung  . On supplemental oxygen by nasal cannula    PRN  . PEPTIC ULCER DISEASE   . Pneumonia   . TOBACCO ABUSE   . Type 2 diabetes mellitus ([place])    denies on 01-06-16    ALLERGIES:  has No Known Allergies.  MEDICATIONS:  Current Outpatient Medications  Medication Sig Dispense Refill  . acetaminophen (TYLENOL) 500 MG tablet Take 250 mg by mouth every 6 (six) hours as needed for moderate pain or headache.     . allopurinol (ZYLOPRIM) 100 MG tablet TAKE 1 TABLET BY MOUTH EVERY DAY (Patient taking differently: Take 100 mg by mouth daily. ) 90 tablet 0  . aspirin 81 MG tablet Take 1 tablet (81 mg total) by mouth at bedtime. Restart on 03/09/18 30 tablet   . atorvastatin (LIPITOR) 80 MG tablet TAKE 1 TABLET BY MOUTH EVERY DAY (Patient taking differently: Take 80 mg by mouth daily. ) 30 tablet 8  . brimonidine (ALPHAGAN) 0.15 % ophthalmic solution Place 1 drop into both eyes in the morning and at bedtime.     . clopidogrel (PLAVIX) 75 MG tablet Take 1 tablet (75 mg total) by mouth daily. Okay to restart this medication on 02/26/2020    . docusate sodium (COLACE) 100 MG capsule Take 100 mg by mouth daily.     Marland Kitchen  eplerenone (INSPRA) 25 MG tablet TAKE 1 TABLET BY MOUTH EVERY DAY 30 tablet 5  . HYDROcodone-homatropine (HYCODAN) 5-1.5 MG/5ML syrup Take 5 mLs by mouth every 6 (six) hours as needed for cough. 240 mL 0  . KLOR-CON M20 20 MEQ tablet TAKE 1 TABLET BY MOUTH DAILY (Patient taking differently: Take 20 mEq by mouth daily. ) 30 tablet 11  . lidocaine-prilocaine (EMLA) cream Apply to the Port-A-Cath site 30 minutes before chemotherapy. 30 g 0  . metoprolol succinate (TOPROL-XL) 25 MG 24 hr tablet Take 25 mg by mouth daily.     . Na Sulfate-K Sulfate-Mg Sulf 17.5-3.13-1.6 GM/177ML SOLN Suprep-Use as directed 354 mL 0  . OXYGEN  Inhale 2 L into the lungs at bedtime.    . pantoprazole (PROTONIX) 40 MG tablet TAKE 40 MG BY MOUTH EVERY DAY (Patient taking differently: Take 40 mg by mouth daily. ) 90 tablet 3  . polyethylene glycol (MIRALAX / GLYCOLAX) 17 g packet Take 17 g by mouth daily as needed for mild constipation or moderate constipation.    Marland Kitchen PROAIR HFA 108 (90 Base) MCG/ACT inhaler Inhale 2 puffs into the lungs every 6 (six) hours as needed for wheezing or shortness of breath.  0  . prochlorperazine (COMPAZINE) 10 MG tablet Take 1 tablet (10 mg total) by mouth every 6 (six) hours as needed for nausea or vomiting. 30 tablet 0  . sildenafil (REVATIO) 20 MG tablet Take 100 mg by mouth daily as needed for erectile dysfunction.    . TRELEGY ELLIPTA 100-62.5-25 MCG/INH AEPB Inhale 1 puff into the lungs daily. 60 each 11   No current facility-administered medications for this visit.    SURGICAL HISTORY:  Past Surgical History:  Procedure Laterality Date  . BRONCHIAL BIOPSY  02/25/2020   Procedure: BRONCHIAL BIOPSIES;  Surgeon: Collene Gobble, MD;  Location: [facility] ENDOSCOPY;  Service: Pulmonary;;  . BRONCHIAL BRUSHINGS  02/25/2020   Procedure: BRONCHIAL BRUSHINGS;  Surgeon: Collene Gobble, MD;  Location: [facility] ENDOSCOPY;  Service: Pulmonary;;  . CARDIAC CATHETERIZATION     '06-stent placed, 6'13 - Dr. Lizbeth Bark follows  . COLONOSCOPY WITH PROPOFOL N/A 09/24/2019   Procedure: COLONOSCOPY WITH PROPOFOL;  Surgeon: Irene Shipper, MD;  Location: [facility] ENDOSCOPY;  Service: Endoscopy;  Laterality: N/A;  . CORONARY STENT PLACEMENT     s/p  . ENDOBRONCHIAL ULTRASOUND Bilateral 01/11/2016   Procedure: ENDOBRONCHIAL ULTRASOUND;  Surgeon: Collene Gobble, MD;  Location: [facility] ENDOSCOPY;  Service: Endoscopy;  Laterality: Bilateral;  . ESOPHAGOGASTRODUODENOSCOPY (EGD) WITH PROPOFOL N/A 09/24/2019   Procedure: ESOPHAGOGASTRODUODENOSCOPY (EGD) WITH PROPOFOL;  Surgeon: Irene Shipper, MD;  Location: [facility] ENDOSCOPY;  Service: Endoscopy;  Laterality:  N/A;  . POLYPECTOMY  09/24/2019   Procedure: POLYPECTOMY;  Surgeon: Irene Shipper, MD;  Location: [facility] ENDOSCOPY;  Service: Endoscopy;;  . VIDEO BRONCHOSCOPY  02/25/2020   Procedure: VIDEO BRONCHOSCOPY WITHOUT FLUORO;  Surgeon: Collene Gobble, MD;  Location: [facility] ENDOSCOPY;  Service: Pulmonary;;  . VIDEO BRONCHOSCOPY WITH ENDOBRONCHIAL NAVIGATION Right 03/07/2018   Procedure: VIDEO BRONCHOSCOPY WITH ENDOBRONCHIAL NAVIGATION;  Surgeon: Collene Gobble, MD;  Location: [facility] OR;  Service: Thoracic;  Laterality: Right;    REVIEW OF SYSTEMS:  A comprehensive review of systems was negative except for: Constitutional: positive for fatigue Respiratory: positive for cough and dyspnea on exertion Gastrointestinal: positive for constipation Musculoskeletal: positive for arthralgias and muscle weakness   PHYSICAL EXAMINATION: General appearance: alert, cooperative, fatigued and no distress Head: Normocephalic, without obvious abnormality, atraumatic Neck: no  adenopathy, no JVD, supple, symmetrical, trachea midline and thyroid not enlarged, symmetric, no tenderness/mass/nodules Lymph nodes: Cervical, supraclavicular, and axillary nodes normal. Resp: clear to auscultation bilaterally Back: symmetric, no curvature. ROM normal. No CVA tenderness. Cardio: regular rate and rhythm, S1, S2 normal, no murmur, click, rub or gallop GI: soft, non-tender; bowel sounds normal; no masses,  no organomegaly Extremities: extremities normal, atraumatic, no cyanosis or edema  ECOG PERFORMANCE STATUS: 1 - Symptomatic but completely ambulatory  Blood pressure (!) 119/56, pulse (!) 111, temperature 98.5 F (36.9 C), temperature source Temporal, resp. rate 17, height 6\' 1"  (1.854 m), weight 227 lb 12.8 oz (103.3 kg), SpO2 95 %.  LABORATORY DATA: Lab Results  Component Value Date   WBC 13.3 (H) 03/09/2020   HGB 11.9 (L) 03/09/2020   HCT 38.8 (L) 03/09/2020   MCV 99.2 03/09/2020   PLT 251 03/09/2020      Chemistry       Component Value Date/Time   NA 140 03/09/2020 0757   NA 141 06/16/2017 0906   K 4.2 03/09/2020 0757   K 3.4 (L) 06/16/2017 0906   CL 99 03/09/2020 0757   CO2 33 (H) 03/09/2020 0757   CO2 35 (H) 06/16/2017 0906   BUN 12 03/09/2020 0757   BUN 10.3 06/16/2017 0906   CREATININE 0.85 03/09/2020 0757   CREATININE 0.8 06/16/2017 0906      Component Value Date/Time   CALCIUM 8.6 (L) 03/09/2020 0757   CALCIUM 9.3 06/16/2017 0906   ALKPHOS 91 03/09/2020 0757   ALKPHOS 87 06/16/2017 0906   AST 19 03/09/2020 0757   AST 21 06/16/2017 0906   ALT 17 03/09/2020 0757   ALT 15 06/16/2017 0906   BILITOT 0.4 03/09/2020 0757   BILITOT 0.46 06/16/2017 0906       RADIOGRAPHIC STUDIES: DG Chest Portable 1 View  Result Date: 02/28/2020 CLINICAL DATA:  Shortness of breath EXAM: PORTABLE CHEST 1 VIEW COMPARISON:  06/26/2018.  Chest CT 01/20/2020 FINDINGS: Right central mass and right upper lobe adjacent airspace disease again noted, similar to recent CT. Mild cardiomegaly. Left lung clear. No effusions or acute bony abnormality. IMPRESSION: Central right lung mass with adjacent right upper lobe atelectasis/airspace disease similar to recent CT. Cardiomegaly. Electronically Signed   By: Rolm Baptise M.D.   On: 02/28/2020 22:54    ASSESSMENT AND PLAN:  This is a very pleasant 79 years old African-American male with recurrent non-small cell lung cancer, squamous cell carcinoma presented initially as unresectable a stage IIA non-small cell lung cancer, squamous cell carcinoma status post a course of concurrent chemoradiation.  The patient has been in observation since April 2017. His scan showed enlarging soft tissue mass in the right upper lobe suspicious for disease recurrence. I ordered a PET scan which was performed yesterday but unfortunately the final report is not available at this point.  I personally and independently reviewed the scan images and discussed the results with the patient today.  His  PET scan showed hypermetabolic activity in the right upper lobe mass in addition to mediastinal lymphadenopathy.  The patient underwent repeat bronchoscopy with biopsy of the suspicious recurrent lung cancer under the care of Dr. Lamonte Sakai and the final pathology was consistent with a squamous cell carcinoma. The patient is currently undergoing systemic chemotherapy with carboplatin for AUC of 5, paclitaxel 175 mg/M2 and Keytruda 200 mg IV every 3 weeks.  Status post 1 cycle.  He started the first cycle of this treatment last week. He tolerated the  first cycle of his treatment well with no concerning adverse effects. I recommended for the patient to continue his treatment as planned and he is expected to start cycle #2 in 2 weeks. For the COPD, he is followed by Dr. Lamonte Sakai.  He will continue on his home oxygen for now. The patient was advised to call immediately if he has any concerning symptoms in the interval. The patient voices understanding of current disease status and treatment options and is in agreement with the current care plan.  . All questions were answered. The patient knows to call the clinic with any problems, questions or concerns. We can certainly see the patient much sooner if necessary.  Disclaimer: This note was dictated with voice recognition software. Similar sounding words can inadvertently be transcribed and may be missed upon review.

## 2020-03-20 ENCOUNTER — Telehealth: Payer: Self-pay | Admitting: Internal Medicine

## 2020-03-20 NOTE — Telephone Encounter (Signed)
Scheduled per los. Called and spoke with patient. Confirmed appt 

## 2020-03-21 ENCOUNTER — Other Ambulatory Visit: Payer: Self-pay | Admitting: Radiology

## 2020-03-23 ENCOUNTER — Ambulatory Visit (HOSPITAL_COMMUNITY)
Admission: RE | Admit: 2020-03-23 | Discharge: 2020-03-23 | Disposition: A | Discharge status: Home / Self Care | Payer: Medicare PPO | Source: Ambulatory Visit | Attending: Internal Medicine | Admitting: Internal Medicine

## 2020-03-23 ENCOUNTER — Other Ambulatory Visit: Payer: Self-pay

## 2020-03-23 ENCOUNTER — Other Ambulatory Visit: Payer: Self-pay | Admitting: Internal Medicine

## 2020-03-23 ENCOUNTER — Encounter (HOSPITAL_COMMUNITY): Payer: Self-pay

## 2020-03-23 DIAGNOSIS — C3491 Malignant neoplasm of unspecified part of right bronchus or lung: Secondary | ICD-10-CM | POA: Diagnosis not present

## 2020-03-23 DIAGNOSIS — Z9981 Dependence on supplemental oxygen: Secondary | ICD-10-CM | POA: Diagnosis not present

## 2020-03-23 DIAGNOSIS — Z955 Presence of coronary angioplasty implant and graft: Secondary | ICD-10-CM | POA: Insufficient documentation

## 2020-03-23 DIAGNOSIS — M1990 Unspecified osteoarthritis, unspecified site: Secondary | ICD-10-CM | POA: Diagnosis not present

## 2020-03-23 DIAGNOSIS — Z79899 Other long term (current) drug therapy: Secondary | ICD-10-CM | POA: Diagnosis not present

## 2020-03-23 DIAGNOSIS — Z7982 Long term (current) use of aspirin: Secondary | ICD-10-CM | POA: Diagnosis not present

## 2020-03-23 DIAGNOSIS — J449 Chronic obstructive pulmonary disease, unspecified: Secondary | ICD-10-CM | POA: Insufficient documentation

## 2020-03-23 DIAGNOSIS — E785 Hyperlipidemia, unspecified: Secondary | ICD-10-CM | POA: Diagnosis not present

## 2020-03-23 DIAGNOSIS — I10 Essential (primary) hypertension: Secondary | ICD-10-CM | POA: Insufficient documentation

## 2020-03-23 DIAGNOSIS — M109 Gout, unspecified: Secondary | ICD-10-CM | POA: Insufficient documentation

## 2020-03-23 DIAGNOSIS — Z87891 Personal history of nicotine dependence: Secondary | ICD-10-CM | POA: Diagnosis not present

## 2020-03-23 DIAGNOSIS — J9621 Acute and chronic respiratory failure with hypoxia: Secondary | ICD-10-CM | POA: Diagnosis not present

## 2020-03-23 DIAGNOSIS — Z7902 Long term (current) use of antithrombotics/antiplatelets: Secondary | ICD-10-CM | POA: Diagnosis not present

## 2020-03-23 DIAGNOSIS — J441 Chronic obstructive pulmonary disease with (acute) exacerbation: Secondary | ICD-10-CM | POA: Diagnosis not present

## 2020-03-23 DIAGNOSIS — I2510 Atherosclerotic heart disease of native coronary artery without angina pectoris: Secondary | ICD-10-CM | POA: Diagnosis not present

## 2020-03-23 DIAGNOSIS — Z5111 Encounter for antineoplastic chemotherapy: Secondary | ICD-10-CM | POA: Diagnosis not present

## 2020-03-23 DIAGNOSIS — E119 Type 2 diabetes mellitus without complications: Secondary | ICD-10-CM | POA: Diagnosis not present

## 2020-03-23 DIAGNOSIS — C3411 Malignant neoplasm of upper lobe, right bronchus or lung: Secondary | ICD-10-CM | POA: Diagnosis not present

## 2020-03-23 DIAGNOSIS — I119 Hypertensive heart disease without heart failure: Secondary | ICD-10-CM | POA: Diagnosis not present

## 2020-03-23 DIAGNOSIS — C3432 Malignant neoplasm of lower lobe, left bronchus or lung: Secondary | ICD-10-CM | POA: Diagnosis not present

## 2020-03-23 DIAGNOSIS — J9622 Acute and chronic respiratory failure with hypercapnia: Secondary | ICD-10-CM | POA: Diagnosis not present

## 2020-03-23 HISTORY — PX: IR IMAGING GUIDED PORT INSERTION: IMG5740

## 2020-03-23 LAB — CBC WITH DIFFERENTIAL/PLATELET
Abs Immature Granulocytes: 2.21 10*3/uL — ABNORMAL HIGH (ref 0.00–0.07)
Basophils Absolute: 0 10*3/uL (ref 0.0–0.1)
Basophils Relative: 0 %
Eosinophils Absolute: 0.1 10*3/uL (ref 0.0–0.5)
Eosinophils Relative: 0 %
HCT: 35.9 % — ABNORMAL LOW (ref 39.0–52.0)
Hemoglobin: 10.6 g/dL — ABNORMAL LOW (ref 13.0–17.0)
Immature Granulocytes: 8 %
Lymphocytes Relative: 9 %
Lymphs Abs: 2.3 10*3/uL (ref 0.7–4.0)
MCH: 30.2 pg (ref 26.0–34.0)
MCHC: 29.5 g/dL — ABNORMAL LOW (ref 30.0–36.0)
MCV: 102.3 fL — ABNORMAL HIGH (ref 80.0–100.0)
Monocytes Absolute: 1.9 10*3/uL — ABNORMAL HIGH (ref 0.1–1.0)
Monocytes Relative: 7 %
Neutro Abs: 19.8 10*3/uL — ABNORMAL HIGH (ref 1.7–7.7)
Neutrophils Relative %: 76 %
Platelets: 177 10*3/uL (ref 150–400)
RBC: 3.51 MIL/uL — ABNORMAL LOW (ref 4.22–5.81)
RDW: 16.1 % — ABNORMAL HIGH (ref 11.5–15.5)
WBC: 26.4 10*3/uL — ABNORMAL HIGH (ref 4.0–10.5)
nRBC: 1.7 % — ABNORMAL HIGH (ref 0.0–0.2)

## 2020-03-23 LAB — PROTIME-INR
INR: 0.9 (ref 0.8–1.2)
Prothrombin Time: 12.1 seconds (ref 11.4–15.2)

## 2020-03-23 LAB — GLUCOSE, CAPILLARY: Glucose-Capillary: 117 mg/dL — ABNORMAL HIGH (ref 70–99)

## 2020-03-23 MED ORDER — MIDAZOLAM HCL 2 MG/2ML IJ SOLN
INTRAMUSCULAR | Status: AC | PRN
  Administered 2020-03-23: 1 mg via INTRAVENOUS

## 2020-03-23 MED ORDER — HEPARIN SOD (PORK) LOCK FLUSH 100 UNIT/ML IV SOLN
INTRAVENOUS | Status: AC
  Filled 2020-03-23: qty 5

## 2020-03-23 MED ORDER — METOPROLOL SUCCINATE ER 25 MG PO TB24
25.0000 mg | ORAL_TABLET | ORAL | Status: AC
  Administered 2020-03-23: 13:00:00 25 mg via ORAL
  Filled 2020-03-23: qty 1

## 2020-03-23 MED ORDER — LIDOCAINE-EPINEPHRINE (PF) 2 %-1:200000 IJ SOLN
INTRAMUSCULAR | Status: AC
  Filled 2020-03-23: qty 20

## 2020-03-23 MED ORDER — LIDOCAINE-EPINEPHRINE 1 %-1:100000 IJ SOLN
INTRAMUSCULAR | Status: AC
  Filled 2020-03-23: qty 1

## 2020-03-23 MED ORDER — MIDAZOLAM HCL 2 MG/2ML IJ SOLN
INTRAMUSCULAR | Status: AC
  Filled 2020-03-23: qty 2

## 2020-03-23 MED ORDER — CEFAZOLIN SODIUM-DEXTROSE 2-4 GM/100ML-% IV SOLN
INTRAVENOUS | Status: AC
  Administered 2020-03-23: 2 g via INTRAVENOUS
  Filled 2020-03-23: qty 100

## 2020-03-23 MED ORDER — ALBUTEROL SULFATE HFA 108 (90 BASE) MCG/ACT IN AERS
2.0000 | INHALATION_SPRAY | RESPIRATORY_TRACT | Status: AC
  Administered 2020-03-23: 2 via RESPIRATORY_TRACT
  Filled 2020-03-23: qty 6.7

## 2020-03-23 MED ORDER — FENTANYL CITRATE (PF) 100 MCG/2ML IJ SOLN
INTRAMUSCULAR | Status: AC
  Filled 2020-03-23: qty 2

## 2020-03-23 MED ORDER — CEFAZOLIN SODIUM-DEXTROSE 2-4 GM/100ML-% IV SOLN: 2.0000 g | INTRAVENOUS | Status: AC

## 2020-03-23 MED ORDER — SODIUM CHLORIDE 0.9 % IV SOLN: INTRAVENOUS | Status: DC

## 2020-03-23 MED ORDER — LIDOCAINE-EPINEPHRINE 1 %-1:100000 IJ SOLN
INTRAMUSCULAR | Status: AC | PRN
  Administered 2020-03-23 (×2): 10 mL via INTRADERMAL

## 2020-03-23 MED ORDER — FENTANYL CITRATE (PF) 100 MCG/2ML IJ SOLN
INTRAMUSCULAR | Status: AC | PRN
  Administered 2020-03-23: 50 ug via INTRAVENOUS

## 2020-03-23 NOTE — Procedures (Signed)
Pre Procedure Dx: Poor venous acess Post Procedural Dx: Same  Successful placement of left IJ approach port-a-cath with tip at the superior caval atrial junction. The catheter is ready for immediate use.  Estimated Blood Loss: Minimal  Complications: None immediate.  Ronny Bacon, MD Pager #: [phone]

## 2020-03-23 NOTE — Progress Notes (Deleted)
Patient has no legal guardian

## 2020-03-23 NOTE — Discharge Instructions (Addendum)
Implanted Port Insertion, Care After This sheet gives you information about how to care for yourself after your procedure. Your health care provider may also give you more specific instructions. If you have problems or questions, contact your health care provider. What can I expect after the procedure? After the procedure, it is common to have:  Discomfort at the port insertion site.  Bruising on the skin over the port. This should improve over 3-4 days. Follow these instructions at home: [facility] care  After your port is placed, you will get a manufacturer's information card. The card has information about your port. Keep this card with you at all times.  Take care of the port as told by your health care provider.   Make sure to remember what type of port you have. Incision care      Follow instructions from your health care provider about how to take care of your port insertion site. Make sure you: ? Wash your hands with soap and water before and after you change your bandage (dressing). If soap and water are not available, use hand sanitizer. ? Remove your dressing tomorrow around 5 PM. You may shower tomorrow. ? Leave  skin glue in place. These skin closures may need to stay in place for 2 weeks or longer. If adhesive strip edges start to loosen and curl up, you may trim the loose edges. Do not remove adhesive strips completely unless your health care provider tells you to do that.  Check your port insertion site every day for signs of infection. Check for: ? Redness, swelling, or pain. ? Fluid or blood. ? Warmth. ? Pus or a bad smell. Activity  Return to your normal activities as told by your health care provider. Ask your health care provider what activities are safe for you.  Do not lift anything that is heavier than 10 lb (4.5 kg), or the limit that you are told, until your health care provider says that it is safe. General instructions  Take over-the-counter and  prescription medicines only as told by your health care provider.  Do not take baths, swim, or use a hot tub until your health care provider approves. You may shower tomorrow.  Do not drive for 24 hours if you were given a sedative during your procedure.  Wear a medical alert bracelet in case of an emergency. This will tell any health care providers that you have a port.  Keep all follow-up visits as told by your health care provider. This is important. Contact a health care provider if:  You cannot flush your port with saline as directed, or you cannot draw blood from the port.  You have a fever or chills.  You have redness, swelling, or pain around your port insertion site.  You have fluid or blood coming from your port insertion site.  Your port insertion site feels warm to the touch.  You have pus or a bad smell coming from the port insertion site. Get help right away if:  You have chest pain or shortness of breath.  You have bleeding from your port that you cannot control. Summary  Take care of the port as told by your health care provider. Keep the manufacturer's information card with you at all times.  Change your dressing as told by your health care provider.  Contact a health care provider if you have a fever or chills or if you have redness, swelling, or pain around your port insertion site.  Keep  all follow-up visits as told by your health care provider. This information is not intended to replace advice given to you by your health care provider. Make sure you discuss any questions you have with your health care provider. Document Revised: 06/26/2018 Document Reviewed: 06/26/2018 Elsevier Patient Education  [place].    Moderate Conscious Sedation, Adult, Care After These instructions provide you with information about caring for yourself after your procedure. Your health care provider may also give you more specific instructions. Your treatment has  been planned according to current medical practices, but problems sometimes occur. Call your health care provider if you have any problems or questions after your procedure. What can I expect after the procedure? After your procedure, it is common:  To feel sleepy for several hours.  To feel clumsy and have poor balance for several hours.  To have poor judgment for several hours.  To vomit if you eat too soon. Follow these instructions at home: For at least 24 hours after the procedure:   Do not: ? Participate in activities where you could fall or become injured. ? Drive. ? Use heavy machinery. ? Drink alcohol. ? Take sleeping pills or medicines that cause drowsiness. ? Make important decisions or sign legal documents. ? Take care of children on your own.  Rest. Eating and drinking  Follow the diet recommended by your health care provider.  If you vomit: ? Drink water, juice, or soup when you can drink without vomiting. ? Make sure you have little or no nausea before eating solid foods. General instructions  Have a responsible adult stay with you until you are awake and alert.  Take over-the-counter and prescription medicines only as told by your health care provider.  If you smoke, do not smoke without supervision.  Keep all follow-up visits as told by your health care provider. This is important. Contact a health care provider if:  You keep feeling nauseous or you keep vomiting.  You feel light-headed.  You develop a rash.  You have a fever. Get help right away if:  You have trouble breathing. This information is not intended to replace advice given to you by your health care provider. Make sure you discuss any questions you have with your health care provider. Document Revised: 11/10/2017 Document Reviewed: 03/19/2016 Elsevier Patient Education  2020 [employer].

## 2020-03-23 NOTE — H&P (Signed)
Referring Physician(s): Mohamed,Mohamed  Supervising Physician: Sandi Mariscal  Patient Status:  [facility]  Chief Complaint: "I'm getting a port a cath"   Subjective: Patient familiar to IR service from right subpectoral lymph node biopsy in 2019.  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  PET scan performed on 02/04/2020 revealed:   1. Intense hypermetabolic activity with enlarging lesion in the right upper lobe peripheral to consolidated lung and in peribronchial soft tissue in the central chest adjacent to bronchus intermedius. Findings are suspicious for disease recurrence. 2. Intense FDG uptake with some loss of fat planes in the intercostal musculature at the margin of collapsed lung, attention on follow-up. 3. Equivocal FDG uptake in the collapsed lung, less than the suspected areas of recurrence may relate primarily to post radiation changes. Disease involvement in this area however given above findings is considered. 4. Mildly FDG avid left neck lymph node only slightly above blood pool, this may be reactive or neoplastic. 5. Prostate activity increased from the previous study but diminished compared to the study of January 2019. Prostate cancer or prostatitis could have this appearance. 6. 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 Surrounding soft tissue activity is also potentially radiation related. Attention on follow-up.  He has poor venous access and presents today for Port-A-Cath placement for additional treatment.  He currently denies fever, headache, chest pain, abdominal/back pain, nausea, vomiting or bleeding.  He does have chronic dyspnea and is on home O2, occasional cough, and constipation.  Past Medical History:  Diagnosis Date  . Arthritis   . CAD, NATIVE VESSEL    coronary stent x1-tx. Plavix use.  Marland Kitchen COPD (chronic obstructive pulmonary disease) ([place])    due to 50+ years Smoking.  Marland Kitchen Dyspnea    wears oxygen PRN  . Gastrointestinal bleeding, upper 04/29/2016  . Gout, unspecified    foot  . HYPERLIPIDEMIA   . HYPERTENSION   . Lung mass   . NSCL ca dx'd 12/2015   Lung  . On supplemental oxygen by nasal cannula    PRN  . PEPTIC ULCER DISEASE   . Pneumonia   . TOBACCO ABUSE   . Type 2 diabetes mellitus ([place])    denies on 01-06-16   Past Surgical History:  Procedure Laterality Date  . BRONCHIAL BIOPSY  02/25/2020   Procedure: BRONCHIAL BIOPSIES;  Surgeon: Collene Gobble, MD;  Location: [facility] ENDOSCOPY;  Service: Pulmonary;;  . BRONCHIAL BRUSHINGS  02/25/2020   Procedure: BRONCHIAL BRUSHINGS;  Surgeon: Collene Gobble, MD;  Location: [facility] ENDOSCOPY;  Service: Pulmonary;;  . CARDIAC CATHETERIZATION     '06-stent placed, 6'13 - Dr. Lizbeth Bark follows  . COLONOSCOPY WITH PROPOFOL N/A 09/24/2019   Procedure: COLONOSCOPY WITH PROPOFOL;  Surgeon: Irene Shipper, MD;  Location: [facility] ENDOSCOPY;  Service: Endoscopy;  Laterality: N/A;  . CORONARY STENT PLACEMENT     s/p  . ENDOBRONCHIAL ULTRASOUND Bilateral 01/11/2016   Procedure: ENDOBRONCHIAL ULTRASOUND;  Surgeon: Collene Gobble, MD;  Location: [facility] ENDOSCOPY;  Service: Endoscopy;  Laterality: Bilateral;  . ESOPHAGOGASTRODUODENOSCOPY (EGD) WITH PROPOFOL N/A 09/24/2019   Procedure: ESOPHAGOGASTRODUODENOSCOPY (EGD) WITH PROPOFOL;  Surgeon: Irene Shipper, MD;  Location: [facility] ENDOSCOPY;  Service: Endoscopy;  Laterality: N/A;  . POLYPECTOMY  09/24/2019   Procedure: POLYPECTOMY;  Surgeon: Irene Shipper, MD;  Location: [facility] ENDOSCOPY;  Service: Endoscopy;;  . VIDEO BRONCHOSCOPY  02/25/2020   Procedure: VIDEO  BRONCHOSCOPY WITHOUT FLUORO;  Surgeon: Collene Gobble, MD;  Location: [facility] ENDOSCOPY;  Service: Pulmonary;;  . VIDEO BRONCHOSCOPY WITH ENDOBRONCHIAL NAVIGATION Right 03/07/2018   Procedure: VIDEO BRONCHOSCOPY WITH ENDOBRONCHIAL NAVIGATION;  Surgeon: Collene Gobble, MD;  Location: [place];  Service: Thoracic;  Laterality:  Right;      Allergies: Patient has no known allergies.  Medications: Prior to Admission medications   Medication Sig Start Date End Date Taking? Authorizing Provider  acetaminophen (TYLENOL) 500 MG tablet Take 250 mg by mouth every 6 (six) hours as needed for moderate pain or headache.     [provider]  allopurinol (ZYLOPRIM) 100 MG tablet TAKE 1 TABLET BY MOUTH EVERY DAY Patient taking differently: Take 100 mg by mouth daily.  01/07/19   Renato Shin, MD  aspirin 81 MG tablet Take 1 tablet (81 mg total) by mouth at bedtime. Restart on 03/09/18 03/07/18   Collene Gobble, MD  atorvastatin (LIPITOR) 80 MG tablet TAKE 1 TABLET BY MOUTH EVERY DAY Patient taking differently: Take 80 mg by mouth daily.  11/01/19   Fay Records, MD  brimonidine (ALPHAGAN) 0.15 % ophthalmic solution Place 1 drop into both eyes in the morning and at bedtime.  01/15/20   [provider]  clopidogrel (PLAVIX) 75 MG tablet Take 1 tablet (75 mg total) by mouth daily. Okay to restart this medication on 02/26/2020 02/25/20   Collene Gobble, MD  docusate sodium (COLACE) 100 MG capsule Take 100 mg by mouth daily.     [provider]  eplerenone (INSPRA) 25 MG tablet TAKE 1 TABLET BY MOUTH EVERY DAY 03/10/20   Byrum, Rose Fillers, MD  HYDROcodone-homatropine [facility]) 5-1.5 MG/5ML syrup Take 5 mLs by mouth every 6 (six) hours as needed for cough. 02/26/20   Byrum, Rose Fillers, MD  KLOR-CON M20 20 MEQ tablet TAKE 1 TABLET BY MOUTH DAILY Patient taking differently: Take 20 mEq by mouth daily.  09/09/18   Renato Shin, MD  lidocaine-prilocaine (EMLA) cream Apply to the Port-A-Cath site 30 minutes before chemotherapy. 03/03/20   Curt Bears, MD  metoprolol succinate (TOPROL-XL) 25 MG 24 hr tablet Take 25 mg by mouth daily.  08/05/19   [provider]  Na Sulfate-K Sulfate-Mg Sulf 17.5-3.13-1.6 GM/177ML SOLN Suprep-Use as directed 09/23/19   Irene Shipper, MD  OXYGEN Inhale 2 L into the lungs at  bedtime.    [provider]  pantoprazole (PROTONIX) 40 MG tablet TAKE 40 MG BY MOUTH EVERY DAY Patient taking differently: Take 40 mg by mouth daily.  03/07/18   Collene Gobble, MD  polyethylene glycol (MIRALAX / GLYCOLAX) 17 g packet Take 17 g by mouth daily as needed for mild constipation or moderate constipation.    [provider]  PROAIR HFA 108 [phone] Base) MCG/ACT inhaler Inhale 2 puffs into the lungs every 6 (six) hours as needed for wheezing or shortness of breath. 02/17/18   [provider]  prochlorperazine (COMPAZINE) 10 MG tablet Take 1 tablet (10 mg total) by mouth every 6 (six) hours as needed for nausea or vomiting. 03/03/20   Curt Bears, MD  sildenafil (REVATIO) 20 MG tablet Take 100 mg by mouth daily as needed for erectile dysfunction. 09/02/19   [provider]  TRELEGY ELLIPTA 100-62.5-25 MCG/INH AEPB Inhale 1 puff into the lungs daily. 09/17/19   Collene Gobble, MD     Vital Signs: Blood pressure 151/80, heart rate 117, temp 99.9, respirations 15, O2 sat 93% 2  L   Physical Exam patient awake, alert.  Chest with distant breath sounds bilaterally.  Heart with tachycardic but regular rhythm.  Abdomen obese, soft, positive bowel sounds, nontender.  Bilateral lower extremity edema noted.  Imaging: No results found.  Labs:  CBC: Recent Labs    02/29/20 0850 03/03/20 1046 03/09/20 0757 03/19/20 0829  WBC 9.8 13.4* 13.3* 38.1*  HGB 12.1* 12.0* 11.9* 11.2*  HCT 41.9 38.9* 38.8* 37.0*  PLT 304 297 251 174    COAGS: Recent Labs    02/18/20 1149  INR 1.0    BMP: Recent Labs    03/01/20 0539 03/03/20 1046 03/09/20 0757 03/19/20 0829  NA 142 141 140 141  K 4.5 4.2 4.2 4.3  CL 93* 95* 99 100  CO2 39* 38* 33* 33*  GLUCOSE 107* 110* 114* 160*  BUN 15 15 12 11   CALCIUM 8.9 8.9 8.6* 8.6*  CREATININE 0.75 0.81 0.85 0.84  GFRNONAA >60 >60 >60 >60  GFRAA >60 >60 >60 >60    LIVER FUNCTION TESTS: Recent Labs     02/28/20 2332 03/03/20 1046 03/09/20 0757 03/19/20 0829  BILITOT 0.6 0.4 0.4 <0.2*  AST 29 23 19 31   ALT 26 19 17 29   ALKPHOS 104 94 91 165*  PROT 8.3* 7.6 7.4 7.2  ALBUMIN 3.4* 3.0* 3.1* 3.3*    Assessment and Plan: 79 yo male, ex smoker, with history of 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  PET scan performed on 02/04/2020 revealed:   1. Intense hypermetabolic activity with enlarging lesion in the right upper lobe peripheral to consolidated lung and in peribronchial soft tissue in the central chest adjacent to bronchus intermedius. Findings are suspicious for disease recurrence. 2. Intense FDG uptake with some loss of fat planes in the intercostal musculature at the margin of collapsed lung, attention on follow-up. 3. Equivocal FDG uptake in the collapsed lung, less than the suspected areas of recurrence may relate primarily to post radiation changes. Disease involvement in this area however given above findings is considered. 4. Mildly FDG avid left neck lymph node only slightly above blood pool, this may be reactive or neoplastic. 5. Prostate activity increased from the previous study but diminished compared to the study of January 2019. Prostate cancer or prostatitis could have this appearance. 6. 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 Surrounding soft tissue activity is also potentially radiation related. Attention on follow-up.  He has poor venous access and presents today for Port-A-Cath placement for additional treatment. Risks and benefits of image guided port-a-catheter placement was discussed with the patient including, but not limited to bleeding, infection, pneumothorax, or fibrin sheath development and need for additional procedures.  All of the patient's questions were answered, patient is agreeable to proceed. Consent signed and in chart.     Electronically Signed: D.  Rowe Robert, PA-C 03/23/2020, 12:40 PM   I spent a total of 25 minutes at the the patient's bedside AND on the patient's hospital floor or unit, greater than 50% of which was counseling/coordinating care for Port-A-Cath placement

## 2020-03-24 ENCOUNTER — Inpatient Hospital Stay: Payer: Medicare PPO

## 2020-03-24 ENCOUNTER — Telehealth: Payer: Self-pay | Admitting: Medical Oncology

## 2020-03-24 NOTE — Telephone Encounter (Signed)
Pt.notified

## 2020-03-24 NOTE — Telephone Encounter (Signed)
No.  We can skip it this week.  Thank you.

## 2020-03-24 NOTE — Telephone Encounter (Signed)
cbc/diff done yesterday  for Port procedure-he did not have a CMP.  Does he need to come in this week for his weekly CMP?

## 2020-03-25 NOTE — Progress Notes (Signed)
Pharmacist Chemotherapy Monitoring - Follow Up Assessment    I verify that I have reviewed each item in the below checklist:  . Regimen for the patient is scheduled for the appropriate day and plan matches scheduled date. Marland Kitchen Appropriate non-routine labs are ordered dependent on drug ordered. . If applicable, additional medications reviewed and ordered per protocol based on lifetime cumulative doses and/or treatment regimen.   Plan for follow-up and/or issues identified: No . I-vent associated with next due treatment: No . MD and/or nursing notified: No  Tora Kindred 03/25/2020 12:43 PM

## 2020-03-26 DIAGNOSIS — I119 Hypertensive heart disease without heart failure: Secondary | ICD-10-CM | POA: Diagnosis not present

## 2020-03-26 DIAGNOSIS — C3411 Malignant neoplasm of upper lobe, right bronchus or lung: Secondary | ICD-10-CM | POA: Diagnosis not present

## 2020-03-26 DIAGNOSIS — E119 Type 2 diabetes mellitus without complications: Secondary | ICD-10-CM | POA: Diagnosis not present

## 2020-03-26 DIAGNOSIS — C3432 Malignant neoplasm of lower lobe, left bronchus or lung: Secondary | ICD-10-CM | POA: Diagnosis not present

## 2020-03-26 DIAGNOSIS — J9621 Acute and chronic respiratory failure with hypoxia: Secondary | ICD-10-CM | POA: Diagnosis not present

## 2020-03-26 DIAGNOSIS — J441 Chronic obstructive pulmonary disease with (acute) exacerbation: Secondary | ICD-10-CM | POA: Diagnosis not present

## 2020-03-26 DIAGNOSIS — M109 Gout, unspecified: Secondary | ICD-10-CM | POA: Diagnosis not present

## 2020-03-26 DIAGNOSIS — J9622 Acute and chronic respiratory failure with hypercapnia: Secondary | ICD-10-CM | POA: Diagnosis not present

## 2020-03-26 DIAGNOSIS — I2510 Atherosclerotic heart disease of native coronary artery without angina pectoris: Secondary | ICD-10-CM | POA: Diagnosis not present

## 2020-03-28 NOTE — Progress Notes (Signed)
[facility] OFFICE PROGRESS NOTE  Cyndi Bender, PA-C [address] [place] [address]  DIAGNOSIS: Recurrent non-small cell lung cancer, squamous cell carcinoma initially presented as unresectable Stage IIA (T1b, N1, M0) non-small cell lung cancer, squamous cell carcinoma presented with right upper lobe lung nodule and hilar lymphadenopathy diagnosed in January 2017.  PRIOR THERAPY:  1) Course of concurrent chemoradiation with weekly carboplatin for AUC of 2 and paclitaxel 45 MG/M2. Status post 7 cycles, last dose was given 03/14/2016 with partial response. 2) status post stereotactic radiotherapy to recurrent lung cancer in the right upper lobe under the care of Dr. Lisbeth Renshaw completed on 06/08/2018  CURRENT THERAPY: Systemic chemotherapy with carboplatin for AUC of 5, paclitaxel 175 mg/M2 and Keytruda 200 mg IV every 3 weeks with Neulasta support.  First cycle March 10, 2020.  Status post 1 cycle.  INTERVAL HISTORY: Ryan Contreras 79 y.o. male returns to the clinic for a follow up visit. The patient is feeling well today without any concerning complaints except he experienced nausea without vomiting last week. The patient was unaware that he had a prescription for nausea medication. The patient tolerated his first cycle well without any adverse effects besides nausea. Denies any fever, chills, night sweats, or weight loss. Denies any chest pain, cough, or hemoptysis but endorses his baseline dyspnea on exertion for which he is on 2L of home oxygen via nasal cannula. The patient lives at home with his daughter who he states is disabled. Denies any diarrhea. He occasionally has constipation for which he use miralax. He states he has a bowel movement about 3x per week. Denies any headache or visual changes but states he has been experiencing spasms/tremors in his left hand in which he accidentally flings silverware and cups out of his hand without knowing/controlling it. Denies any known  seizures. Denies any rashes or skin changes. The patient is here today for evaluation prior to starting cycle # 2   MEDICAL HISTORY: Past Medical History:  Diagnosis Date  . Arthritis   . CAD, NATIVE VESSEL    coronary stent x1-tx. Plavix use.  Marland Kitchen COPD (chronic obstructive pulmonary disease) ([place])    due to 50+ years Smoking.  Marland Kitchen Dyspnea    wears oxygen PRN  . Gastrointestinal bleeding, upper 04/29/2016  . Gout, unspecified    foot  . HYPERLIPIDEMIA   . HYPERTENSION   . Lung mass   . NSCL ca dx'd 12/2015   Lung  . On supplemental oxygen by nasal cannula    PRN  . PEPTIC ULCER DISEASE   . Pneumonia   . TOBACCO ABUSE   . Type 2 diabetes mellitus ([place])    denies on 01-06-16    ALLERGIES:  has No Known Allergies.  MEDICATIONS:  Current Outpatient Medications  Medication Sig Dispense Refill  . acetaminophen (TYLENOL) 500 MG tablet Take 250 mg by mouth every 6 (six) hours as needed for moderate pain or headache.     . allopurinol (ZYLOPRIM) 100 MG tablet TAKE 1 TABLET BY MOUTH EVERY DAY (Patient taking differently: Take 100 mg by mouth daily. ) 90 tablet 0  . aspirin 81 MG tablet Take 1 tablet (81 mg total) by mouth at bedtime. Restart on 03/09/18 30 tablet   . atorvastatin (LIPITOR) 80 MG tablet TAKE 1 TABLET BY MOUTH EVERY DAY (Patient taking differently: Take 80 mg by mouth daily. ) 30 tablet 8  . brimonidine (ALPHAGAN) 0.15 % ophthalmic solution Place 1 drop into both  eyes in the morning and at bedtime.     . clopidogrel (PLAVIX) 75 MG tablet Take 1 tablet (75 mg total) by mouth daily. Okay to restart this medication on 02/26/2020    . docusate sodium (COLACE) 100 MG capsule Take 100 mg by mouth daily.     Marland Kitchen eplerenone (INSPRA) 25 MG tablet TAKE 1 TABLET BY MOUTH EVERY DAY 30 tablet 5  . HYDROcodone-homatropine (HYCODAN) 5-1.5 MG/5ML syrup Take 5 mLs by mouth every 6 (six) hours as needed for cough. 240 mL 0  . KLOR-CON M20 20 MEQ tablet TAKE 1 TABLET BY MOUTH DAILY (Patient taking  differently: Take 20 mEq by mouth daily. ) 30 tablet 11  . lidocaine-prilocaine (EMLA) cream Apply to the Port-A-Cath site 30 minutes before chemotherapy. 30 g 0  . metoprolol succinate (TOPROL-XL) 25 MG 24 hr tablet Take 25 mg by mouth daily.     . Na Sulfate-K Sulfate-Mg Sulf 17.5-3.13-1.6 GM/177ML SOLN Suprep-Use as directed 354 mL 0  . OXYGEN Inhale 2 L into the lungs at bedtime.    . pantoprazole (PROTONIX) 40 MG tablet TAKE 40 MG BY MOUTH EVERY DAY (Patient taking differently: Take 40 mg by mouth daily. ) 90 tablet 3  . polyethylene glycol (MIRALAX / GLYCOLAX) 17 g packet Take 17 g by mouth daily as needed for mild constipation or moderate constipation.    Marland Kitchen PROAIR HFA 108 (90 Base) MCG/ACT inhaler Inhale 2 puffs into the lungs every 6 (six) hours as needed for wheezing or shortness of breath.  0  . prochlorperazine (COMPAZINE) 10 MG tablet Take 1 tablet (10 mg total) by mouth every 6 (six) hours as needed for nausea or vomiting. 30 tablet 0  . sildenafil (REVATIO) 20 MG tablet Take 100 mg by mouth daily as needed for erectile dysfunction.    . TRELEGY ELLIPTA 100-62.5-25 MCG/INH AEPB Inhale 1 puff into the lungs daily. 60 each 11   No current facility-administered medications for this visit.   Facility-Administered Medications Ordered in Other Visits  Medication Dose Route Frequency Provider Last Rate Last Admin  . CARBOplatin (PARAPLATIN) 580 mg in sodium chloride 0.9 % 250 mL chemo infusion  580 mg Intravenous Once Curt Bears, MD      . heparin lock flush 100 unit/mL  500 Units Intracatheter Once PRN Curt Bears, MD      . PACLitaxel (TAXOL) 402 mg in sodium chloride 0.9 % 500 mL chemo infusion (> 51m/m2)  175 mg/m2 (Treatment Plan Recorded) Intravenous Once MCurt Bears MD 189 mL/hr at 03/31/20 1248 402 mg at 03/31/20 1248  . sodium chloride flush (NS) 0.9 % injection 10 mL  10 mL Intracatheter PRN MCurt Bears MD        SURGICAL HISTORY:  Past Surgical History:   Procedure Laterality Date  . BRONCHIAL BIOPSY  02/25/2020   Procedure: BRONCHIAL BIOPSIES;  Surgeon: BCollene Gobble MD;  [facility];  Service: Pulmonary;;  . BRONCHIAL BRUSHINGS  02/25/2020   Procedure: BRONCHIAL BRUSHINGS;  Surgeon: BCollene Gobble MD;  [facility];  Service: Pulmonary;;  . CARDIAC CATHETERIZATION     '06-stent placed, 6'13 - Dr. PLizbeth Barkfollows  . COLONOSCOPY WITH PROPOFOL N/A 09/24/2019   Procedure: COLONOSCOPY WITH PROPOFOL;  Surgeon: PIrene Shipper MD;  Location: [facility] ENDOSCOPY;  Service: Endoscopy;  Laterality: N/A;  . CORONARY STENT PLACEMENT     s/p  . ENDOBRONCHIAL ULTRASOUND Bilateral 01/11/2016   Procedure: ENDOBRONCHIAL ULTRASOUND;  Surgeon: RCollene Gobble MD;  Location: [facility] ENDOSCOPY;  Service: Endoscopy;  Laterality: Bilateral;  . ESOPHAGOGASTRODUODENOSCOPY (EGD) WITH PROPOFOL N/A 09/24/2019   Procedure: ESOPHAGOGASTRODUODENOSCOPY (EGD) WITH PROPOFOL;  Surgeon: Irene Shipper, MD;  Location: [facility] ENDOSCOPY;  Service: Endoscopy;  Laterality: N/A;  . IR IMAGING GUIDED PORT INSERTION  03/23/2020  . POLYPECTOMY  09/24/2019   Procedure: POLYPECTOMY;  Surgeon: Irene Shipper, MD;  Location: [facility];  Service: Endoscopy;;  . VIDEO BRONCHOSCOPY  02/25/2020   Procedure: VIDEO BRONCHOSCOPY WITHOUT FLUORO;  Surgeon: Collene Gobble, MD;  Location[facility] ENDOSCOPY;  Service: Pulmonary;;  . VIDEO BRONCHOSCOPY WITH ENDOBRONCHIAL NAVIGATION Right 03/07/2018   Procedure: VIDEO BRONCHOSCOPY WITH ENDOBRONCHIAL NAVIGATION;  Surgeon: Collene Gobble, MD;  Location: [facility];  Service: Thoracic;  Laterality: Right;    REVIEW OF SYSTEMS:   Review of Systems  Constitutional: Positive for fatigue. Negative for appetite change, chills,  fever and unexpected weight change.  HENT: Negative for mouth sores, nosebleeds, sore throat and trouble swallowing.   Eyes: Negative for eye problems and icterus.  Respiratory: Positive for shortness of breath with exertion and  baseline productive cough. Negative for hemoptysis and wheezing.   Cardiovascular: Negative for chest pain and leg swelling.  Gastrointestinal: Positive for mild nausea and occasional constipation. Negative for abdominal pain, diarrhea, and vomiting.  Genitourinary: Negative for bladder incontinence, difficulty urinating, dysuria, frequency and hematuria.   Musculoskeletal: Negative for back pain, gait problem, neck pain and neck stiffness.  Skin: Negative for itching and rash.  Neurological: Negative for dizziness, extremity weakness, gait problem, headaches, light-headedness and seizures.  Hematological: Negative for adenopathy. Does not bruise/bleed easily.  Psychiatric/Behavioral: Negative for confusion, depression and sleep disturbance. The patient is not nervous/anxious.     PHYSICAL EXAMINATION:  Blood pressure (!) 143/93, pulse (!) 108, temperature 98.1 F (36.7 C), temperature source Temporal, resp. rate 20, height '6\' 1"'  (1.854 m), weight 228 lb 1.6 oz (103.5 kg), SpO2 93 %.  ECOG PERFORMANCE STATUS: 2 - Symptomatic, <50% confined to bed  Physical Exam  Constitutional: Oriented to person, place, and time and well-developed, well-nourished, and in no distress. On 2 L of oxygen via nasal cannula.  HENT:  Head: Normocephalic and atraumatic.  Mouth/Throat: Oropharynx is clear and moist. No oropharyngeal exudate.  Eyes: Conjunctivae are normal. Right eye exhibits no discharge. Left eye exhibits no discharge. No scleral icterus.  Neck: Normal range of motion. Neck supple.  Cardiovascular: Normal rate, regular rhythm, normal heart sounds and intact distal pulses.   Pulmonary/Chest: Effort normal and breath sounds normal. No respiratory distress. No wheezes. No rales. 2L of oxygen via nasal cannula.  Abdominal: Soft. Bowel sounds are normal. Exhibits no distension and no mass. There is no tenderness.  Musculoskeletal: Normal range of motion. Exhibits no edema.  Lymphadenopathy:    No  cervical adenopathy.  Neurological: Alert and oriented to person, place, and time. Exhibits normal muscle tone. Patient examined in the wheelchair.  Skin: Skin is warm and dry. No rash noted. Not diaphoretic. No erythema. No pallor.  Psychiatric: Mood and judgment normal. Seemed to have memory deficits when asked about prior appointments.  Vitals reviewed.  LABORATORY DATA: Lab Results  Component Value Date   WBC 11.8 (H) 03/31/2020   HGB 11.0 (L) 03/31/2020   HCT 37.8 (L) 03/31/2020   MCV 102.4 (H) 03/31/2020   PLT 347 03/31/2020      Chemistry      Component Value Date/Time   NA 140 03/31/2020 0900   NA 141 06/16/2017 0906  K 5.4 (H) 03/31/2020 0900   K 3.4 (L) 06/16/2017 0906   CL 93 (L) 03/31/2020 0900   CO2 39 (H) 03/31/2020 0900   CO2 35 (H) 06/16/2017 0906   BUN 10 03/31/2020 0900   BUN 10.3 06/16/2017 0906   CREATININE 0.90 03/31/2020 0900   CREATININE 0.8 06/16/2017 0906      Component Value Date/Time   CALCIUM 9.2 03/31/2020 0900   CALCIUM 9.3 06/16/2017 0906   ALKPHOS 111 03/31/2020 0900   ALKPHOS 87 06/16/2017 0906   AST 23 03/31/2020 0900   AST 21 06/16/2017 0906   ALT 20 03/31/2020 0900   ALT 15 06/16/2017 0906   BILITOT 0.3 03/31/2020 0900   BILITOT 0.46 06/16/2017 0906       RADIOGRAPHIC STUDIES:  IR IMAGING GUIDED PORT INSERTION  Result Date: 03/23/2020 INDICATION: History of recurrent lung cancer. In need durable intravenous access for chemotherapy administration. EXAM: IMPLANTED PORT A CATH PLACEMENT WITH ULTRASOUND AND FLUOROSCOPIC GUIDANCE COMPARISON:  PET-CT-02/05/2020; chest CT-01/20/2020 MEDICATIONS: Ancef 2 gm IV; The antibiotic was administered within an appropriate time interval prior to skin puncture. ANESTHESIA/SEDATION: Moderate (conscious) sedation was employed during this procedure. A total of Versed 1 mg and Fentanyl 50 mcg was administered intravenously. Moderate Sedation Time: 40 minutes. The patient's level of consciousness and  vital signs were monitored continuously by radiology nursing throughout the procedure under my direct supervision. CONTRAST:  None FLUOROSCOPY TIME:  36 seconds (19 mGy) COMPLICATIONS: None immediate. Note, patient was found have asymptomatic tachycardic prior to procedure. 12 lead EKG demonstrated sinus tachycardia and again, the patient was asymptomatic, specifically, no chest pain shortness of breath, with normal hemodynamic stability and as such the decision was made to proceed with definitive Port a catheter placement. PROCEDURE: The procedure, risks, benefits, and alternatives were explained to the patient. Questions regarding the procedure were encouraged and answered. The patient understands and consents to the procedure. Given presence right-sided lung cancer recurrence, decision was made to proceed with left internal jugular approach port a catheter placement. The left neck and chest were prepped with chlorhexidine in a sterile fashion, and a sterile drape was applied covering the operative field. Maximum barrier sterile technique with sterile gowns and gloves were used for the procedure. A timeout was performed prior to the initiation of the procedure. Local anesthesia was provided with 1% lidocaine with epinephrine. After creating a small venotomy incision, a micropuncture kit was utilized to access the internal jugular vein. Real-time ultrasound guidance was utilized for vascular access including the acquisition of a permanent ultrasound image documenting patency of the accessed vessel. The microwire was utilized to measure appropriate catheter length. A subcutaneous port pocket was then created along the upper chest wall utilizing a combination of sharp and blunt dissection. The pocket was irrigated with sterile saline. A single lumen "ISP" sized power injectable port was chosen for placement. The 8 Fr catheter was tunneled from the port pocket site to the venotomy incision. The port was placed in the  pocket. The external catheter was trimmed to appropriate length. At the venotomy, an 8 Fr peel-away sheath was placed over a guidewire under fluoroscopic guidance. The catheter was then placed through the sheath and the sheath was removed. Final catheter positioning was confirmed and documented with a fluoroscopic spot radiograph. The port was accessed with a Huber needle, aspirated and flushed with heparinized saline. The venotomy site was closed with an interrupted 4-0 Vicryl suture. The port pocket incision was closed with interrupted 2-0 Vicryl  suture. The skin was opposed with a running subcuticular 4-0 Vicryl suture. Dermabond and Steri-strips were applied to both incisions. Dressings were applied. The patient tolerated the procedure well without immediate post procedural complication. FINDINGS: After catheter placement, the tip lies within the superior cavoatrial junction. The catheter aspirates and flushes normally and is ready for immediate use. IMPRESSION: Successful placement of a left internal jugular approach power injectable Port-A-Cath. The catheter is ready for immediate use. Electronically Signed   By: Sandi Mariscal M.D.   On: 03/23/2020 16:09     ASSESSMENT/PLAN:  This is a very pleasant 79 year old African-American male with recurrent non-small cell lung cancer, squamous cell carcinoma presented initially as unresectable a stage IIA non-small cell lung cancer, squamous cell carcinoma with right upper lobe lung nodule and hilar lymphadenopathy. He was initially diagnosed in January 2017.   He is status post a course of concurrent chemoradiation.   He had been on observation since April 2017.   He recently was found to have an enlarging soft tissue mass in the right upper lobe suspicious for disease recurrence. The patient underwent repeat bronchoscopy with biopsy of the suspicious recurrent lung cancer under the care of Dr. Lamonte Sakai and the final pathology was consistent with a squamous cell  carcinoma.  He is currently undergoing systemic chemotherapy with carboplatin for AUC of 5, paclitaxel 175 mg/M2 and Keytruda 200 mg IV every 3 weeks.  Status post 1 cycle. He tolerated his first cycle well without any adverse side effects except mild nausea.    Labs were reviewed. Recommend that he proceed with cycle #2 today as scheduled. Will monitor his potassium on weekly labs.   We will see him back for a follow up visit in 3 weeks for evaluation before starting cycle #3.   Considering the patient's last initial staging brain MRI was in 2017, we will arrange for a restaging MRI of his brain to rule out any metastatic disease to the brain considering the new motor symptoms in his left hand.   The patient is oxygen dependent, elderly, and seems confused regarding what his medications are for as well as recalling his prior appointments for injections. He lives at home with his disabled daughter and notes that it is challenging. Requesting home health referral for nursing assistance. Will place referral.   The patient was unaware that he had nausea medication at home. Reviewed with the patient that he has a prescription for compazine for nausea and that he may take that every 6 hours as needed for nausea.   Reviewed constipation education with the patient. Encouraged him to drink plenty of fluids and eat fruits and veggies. Also discussed the goal is to have a bowel movement daily or every other day. Advised that he may take stool softener and laxatives if needed.   The patient was advised to call immediately if he has any concerning symptoms in the interval. The patient voices understanding of current disease status and treatment options and is in agreement with the current care plan. All questions were answered. The patient knows to call the clinic with any problems, questions or concerns. We can certainly see the patient much sooner if necessary      Orders Placed This Encounter   Procedures  . MR Brain W Wo Contrast    Standing Status:   Future    Standing Expiration Date:   03/31/2021    Order Specific Question:   ** REASON FOR EXAM (FREE TEXT)  Answer:   Lung Cancer, new complaints of tremors in his left hand, r/o brain mets    Order Specific Question:   If indicated for the ordered procedure, I authorize the administration of contrast media per Radiology protocol    Answer:   Yes    Order Specific Question:   What is the patient's sedation requirement?    Answer:   No Sedation    Order Specific Question:   Does the patient have a pacemaker or implanted devices?    Answer:   No    Order Specific Question:   Use SRS Protocol?    Answer:   No    Order Specific Question:   Radiology Contrast Protocol - do NOT remove file path    Answer:   [email or web address]    Order Specific Question:   Preferred imaging location?    Answer:   [facility] (table limit - 550 lbs)  . Ambulatory referral to Home Health    Referral Priority:   Routine    Referral Type:   Home Health Care    Referral Reason:   Specialty Services Required    Requested Specialty:   [place]    Number of Visits Requested:   [place], PA-C 03/31/20

## 2020-03-31 ENCOUNTER — Inpatient Hospital Stay: Payer: Medicare PPO

## 2020-03-31 ENCOUNTER — Inpatient Hospital Stay (HOSPITAL_BASED_OUTPATIENT_CLINIC_OR_DEPARTMENT_OTHER): Payer: Medicare PPO | Admitting: Physician Assistant

## 2020-03-31 ENCOUNTER — Other Ambulatory Visit: Payer: Self-pay

## 2020-03-31 ENCOUNTER — Telehealth: Payer: Self-pay

## 2020-03-31 VITALS — BP 143/93 | HR 108 | Temp 98.1°F | Resp 20 | Ht 73.0 in | Wt 228.1 lb

## 2020-03-31 DIAGNOSIS — J441 Chronic obstructive pulmonary disease with (acute) exacerbation: Secondary | ICD-10-CM | POA: Diagnosis present

## 2020-03-31 DIAGNOSIS — E785 Hyperlipidemia, unspecified: Secondary | ICD-10-CM | POA: Diagnosis present

## 2020-03-31 DIAGNOSIS — E873 Alkalosis: Secondary | ICD-10-CM | POA: Diagnosis not present

## 2020-03-31 DIAGNOSIS — R4020 Unspecified coma: Secondary | ICD-10-CM | POA: Diagnosis not present

## 2020-03-31 DIAGNOSIS — K5900 Constipation, unspecified: Secondary | ICD-10-CM | POA: Diagnosis not present

## 2020-03-31 DIAGNOSIS — C3411 Malignant neoplasm of upper lobe, right bronchus or lung: Secondary | ICD-10-CM | POA: Diagnosis present

## 2020-03-31 DIAGNOSIS — R251 Tremor, unspecified: Secondary | ICD-10-CM | POA: Diagnosis not present

## 2020-03-31 DIAGNOSIS — G9341 Metabolic encephalopathy: Secondary | ICD-10-CM | POA: Diagnosis not present

## 2020-03-31 DIAGNOSIS — R4182 Altered mental status, unspecified: Secondary | ICD-10-CM | POA: Diagnosis not present

## 2020-03-31 DIAGNOSIS — R531 Weakness: Secondary | ICD-10-CM | POA: Diagnosis not present

## 2020-03-31 DIAGNOSIS — Z5111 Encounter for antineoplastic chemotherapy: Secondary | ICD-10-CM | POA: Diagnosis not present

## 2020-03-31 DIAGNOSIS — R0602 Shortness of breath: Secondary | ICD-10-CM | POA: Diagnosis present

## 2020-03-31 DIAGNOSIS — R0902 Hypoxemia: Secondary | ICD-10-CM | POA: Diagnosis not present

## 2020-03-31 DIAGNOSIS — Z87891 Personal history of nicotine dependence: Secondary | ICD-10-CM | POA: Diagnosis not present

## 2020-03-31 DIAGNOSIS — Z955 Presence of coronary angioplasty implant and graft: Secondary | ICD-10-CM | POA: Diagnosis not present

## 2020-03-31 DIAGNOSIS — J9621 Acute and chronic respiratory failure with hypoxia: Secondary | ICD-10-CM | POA: Diagnosis present

## 2020-03-31 DIAGNOSIS — R410 Disorientation, unspecified: Secondary | ICD-10-CM | POA: Diagnosis not present

## 2020-03-31 DIAGNOSIS — R000 Tachycardia, unspecified: Secondary | ICD-10-CM | POA: Diagnosis not present

## 2020-03-31 DIAGNOSIS — Z9221 Personal history of antineoplastic chemotherapy: Secondary | ICD-10-CM | POA: Diagnosis not present

## 2020-03-31 DIAGNOSIS — C3491 Malignant neoplasm of unspecified part of right bronchus or lung: Secondary | ICD-10-CM

## 2020-03-31 DIAGNOSIS — J440 Chronic obstructive pulmonary disease with acute lower respiratory infection: Secondary | ICD-10-CM | POA: Diagnosis present

## 2020-03-31 DIAGNOSIS — J9622 Acute and chronic respiratory failure with hypercapnia: Secondary | ICD-10-CM | POA: Diagnosis present

## 2020-03-31 DIAGNOSIS — Z66 Do not resuscitate: Secondary | ICD-10-CM | POA: Diagnosis not present

## 2020-03-31 DIAGNOSIS — E119 Type 2 diabetes mellitus without complications: Secondary | ICD-10-CM | POA: Diagnosis present

## 2020-03-31 DIAGNOSIS — M1A9XX0 Chronic gout, unspecified, without tophus (tophi): Secondary | ICD-10-CM | POA: Diagnosis not present

## 2020-03-31 DIAGNOSIS — E662 Morbid (severe) obesity with alveolar hypoventilation: Secondary | ICD-10-CM | POA: Diagnosis present

## 2020-03-31 DIAGNOSIS — Z923 Personal history of irradiation: Secondary | ICD-10-CM | POA: Diagnosis not present

## 2020-03-31 DIAGNOSIS — J9601 Acute respiratory failure with hypoxia: Secondary | ICD-10-CM | POA: Diagnosis not present

## 2020-03-31 DIAGNOSIS — Z7189 Other specified counseling: Secondary | ICD-10-CM | POA: Diagnosis not present

## 2020-03-31 DIAGNOSIS — I10 Essential (primary) hypertension: Secondary | ICD-10-CM | POA: Diagnosis present

## 2020-03-31 DIAGNOSIS — R0689 Other abnormalities of breathing: Secondary | ICD-10-CM | POA: Diagnosis not present

## 2020-03-31 DIAGNOSIS — J9602 Acute respiratory failure with hypercapnia: Secondary | ICD-10-CM | POA: Diagnosis not present

## 2020-03-31 DIAGNOSIS — Z5112 Encounter for antineoplastic immunotherapy: Secondary | ICD-10-CM

## 2020-03-31 DIAGNOSIS — R579 Shock, unspecified: Secondary | ICD-10-CM | POA: Diagnosis not present

## 2020-03-31 DIAGNOSIS — J9819 Other pulmonary collapse: Secondary | ICD-10-CM | POA: Diagnosis present

## 2020-03-31 DIAGNOSIS — I2510 Atherosclerotic heart disease of native coronary artery without angina pectoris: Secondary | ICD-10-CM | POA: Diagnosis present

## 2020-03-31 DIAGNOSIS — J189 Pneumonia, unspecified organism: Secondary | ICD-10-CM | POA: Diagnosis present

## 2020-03-31 DIAGNOSIS — E874 Mixed disorder of acid-base balance: Secondary | ICD-10-CM | POA: Diagnosis present

## 2020-03-31 DIAGNOSIS — E875 Hyperkalemia: Secondary | ICD-10-CM | POA: Diagnosis present

## 2020-03-31 DIAGNOSIS — Z20822 Contact with and (suspected) exposure to covid-19: Secondary | ICD-10-CM | POA: Diagnosis present

## 2020-03-31 DIAGNOSIS — Z515 Encounter for palliative care: Secondary | ICD-10-CM | POA: Diagnosis not present

## 2020-03-31 DIAGNOSIS — Z7902 Long term (current) use of antithrombotics/antiplatelets: Secondary | ICD-10-CM | POA: Diagnosis not present

## 2020-03-31 LAB — CMP (CANCER CENTER ONLY)
ALT: 20 U/L (ref 0–44)
AST: 23 U/L (ref 15–41)
Albumin: 2.8 g/dL — ABNORMAL LOW (ref 3.5–5.0)
Alkaline Phosphatase: 111 U/L (ref 38–126)
Anion gap: 8 (ref 5–15)
BUN: 10 mg/dL (ref 8–23)
CO2: 39 mmol/L — ABNORMAL HIGH (ref 22–32)
Calcium: 9.2 mg/dL (ref 8.9–10.3)
Chloride: 93 mmol/L — ABNORMAL LOW (ref 98–111)
Creatinine: 0.9 mg/dL (ref 0.61–1.24)
GFR, Est AFR Am: >60 mL/min (ref >60)
GFR, Estimated: >60 mL/min (ref >60)
Glucose, Bld: 229 mg/dL — ABNORMAL HIGH (ref 70–99)
Potassium: 5.4 mmol/L — ABNORMAL HIGH (ref 3.5–5.1)
Sodium: 140 mmol/L (ref 135–145)
Total Bilirubin: 0.3 mg/dL (ref 0.3–1.2)
Total Protein: 7.7 g/dL (ref 6.5–8.1)

## 2020-03-31 LAB — CBC WITH DIFFERENTIAL (CANCER CENTER ONLY)
Abs Immature Granulocytes: 0.13 10*3/uL — ABNORMAL HIGH (ref 0.00–0.07)
Basophils Absolute: 0.1 10*3/uL (ref 0.0–0.1)
Basophils Relative: 1 %
Eosinophils Absolute: 0.1 10*3/uL (ref 0.0–0.5)
Eosinophils Relative: 1 %
HCT: 37.8 % — ABNORMAL LOW (ref 39.0–52.0)
Hemoglobin: 11 g/dL — ABNORMAL LOW (ref 13.0–17.0)
Immature Granulocytes: 1 %
Lymphocytes Relative: 13 %
Lymphs Abs: 1.6 10*3/uL (ref 0.7–4.0)
MCH: 29.8 pg (ref 26.0–34.0)
MCHC: 29.1 g/dL — ABNORMAL LOW (ref 30.0–36.0)
MCV: 102.4 fL — ABNORMAL HIGH (ref 80.0–100.0)
Monocytes Absolute: 2 10*3/uL — ABNORMAL HIGH (ref 0.1–1.0)
Monocytes Relative: 17 %
Neutro Abs: 8 10*3/uL — ABNORMAL HIGH (ref 1.7–7.7)
Neutrophils Relative %: 67 %
Platelet Count: 347 10*3/uL (ref 150–400)
RBC: 3.69 MIL/uL — ABNORMAL LOW (ref 4.22–5.81)
RDW: 15.8 % — ABNORMAL HIGH (ref 11.5–15.5)
WBC Count: 11.8 10*3/uL — ABNORMAL HIGH (ref 4.0–10.5)
nRBC: 0 % (ref 0.0–0.2)

## 2020-03-31 LAB — TSH: TSH: 0.571 u[IU]/mL (ref 0.320–4.118)

## 2020-03-31 MED ORDER — FAMOTIDINE IN NACL 20-0.9 MG/50ML-% IV SOLN
INTRAVENOUS | Status: AC
  Filled 2020-03-31: qty 50

## 2020-03-31 MED ORDER — SODIUM CHLORIDE 0.9 % IV SOLN
150.0000 mg | Freq: Once | INTRAVENOUS | Status: AC
  Administered 2020-03-31: 150 mg via INTRAVENOUS
  Filled 2020-03-31: qty 150

## 2020-03-31 MED ORDER — SODIUM CHLORIDE 0.9 % IV SOLN
200.0000 mg | Freq: Once | INTRAVENOUS | Status: AC
  Administered 2020-03-31: 12:00:00 200 mg via INTRAVENOUS
  Filled 2020-03-31: qty 8

## 2020-03-31 MED ORDER — SODIUM CHLORIDE 0.9 % IV SOLN
Freq: Once | INTRAVENOUS | Status: AC
  Filled 2020-03-31: qty 250

## 2020-03-31 MED ORDER — HEPARIN SOD (PORK) LOCK FLUSH 100 UNIT/ML IV SOLN
500.0000 [IU] | Freq: Once | INTRAVENOUS | Status: AC | PRN
  Administered 2020-03-31: 500 [IU]
  Filled 2020-03-31: qty 5

## 2020-03-31 MED ORDER — SODIUM CHLORIDE 0.9% FLUSH
10.0000 mL | INTRAVENOUS | Status: DC | PRN
  Administered 2020-03-31: 17:00:00 10 mL
  Filled 2020-03-31: qty 10

## 2020-03-31 MED ORDER — PALONOSETRON HCL INJECTION 0.25 MG/5ML
INTRAVENOUS | Status: AC
  Filled 2020-03-31: qty 5

## 2020-03-31 MED ORDER — SODIUM CHLORIDE 0.9 % IV SOLN
10.0000 mg | Freq: Once | INTRAVENOUS | Status: AC
  Administered 2020-03-31: 12:00:00 10 mg via INTRAVENOUS
  Filled 2020-03-31: qty 10

## 2020-03-31 MED ORDER — SODIUM CHLORIDE 0.9 % IV SOLN
175.0000 mg/m2 | Freq: Once | INTRAVENOUS | Status: AC
  Administered 2020-03-31: 402 mg via INTRAVENOUS
  Filled 2020-03-31: qty 67

## 2020-03-31 MED ORDER — DIPHENHYDRAMINE HCL 50 MG/ML IJ SOLN
INTRAMUSCULAR | Status: AC
  Filled 2020-03-31: qty 1

## 2020-03-31 MED ORDER — FAMOTIDINE IN NACL 20-0.9 MG/50ML-% IV SOLN
20.0000 mg | Freq: Once | INTRAVENOUS | Status: AC
  Administered 2020-03-31: 20 mg via INTRAVENOUS

## 2020-03-31 MED ORDER — PALONOSETRON HCL INJECTION 0.25 MG/5ML
0.2500 mg | Freq: Once | INTRAVENOUS | Status: AC
  Administered 2020-03-31: 0.25 mg via INTRAVENOUS

## 2020-03-31 MED ORDER — SODIUM CHLORIDE 0.9 % IV SOLN
584.0000 mg | Freq: Once | INTRAVENOUS | Status: AC
  Administered 2020-03-31: 580 mg via INTRAVENOUS
  Filled 2020-03-31: qty 58

## 2020-03-31 MED ORDER — DIPHENHYDRAMINE HCL 50 MG/ML IJ SOLN
50.0000 mg | Freq: Once | INTRAMUSCULAR | Status: AC
  Administered 2020-03-31: 11:00:00 50 mg via INTRAVENOUS

## 2020-03-31 NOTE — Progress Notes (Signed)
[facility] unable to assist.    Processing referral to [facility].  Social Worker, Materials engineer, Nursing. Nurse Aide is currently pending because they only have one worker available at this time and other person is under training.

## 2020-03-31 NOTE — Patient Instructions (Signed)
Cancer Center Discharge Instructions for Patients Receiving Chemotherapy  Today you received the following chemotherapy agents: Keytruda, Taxol, Carboplatin  To help prevent nausea and vomiting after your treatment, we encourage you to take your nausea medication as directed.   If you develop nausea and vomiting that is not controlled by your nausea medication, call the clinic.   BELOW ARE SYMPTOMS THAT SHOULD BE REPORTED IMMEDIATELY:  *FEVER GREATER THAN 100.5 F  *CHILLS WITH OR WITHOUT FEVER  NAUSEA AND VOMITING THAT IS NOT CONTROLLED WITH YOUR NAUSEA MEDICATION  *UNUSUAL SHORTNESS OF BREATH  *UNUSUAL BRUISING OR BLEEDING  TENDERNESS IN MOUTH AND THROAT WITH OR WITHOUT PRESENCE OF ULCERS  *URINARY PROBLEMS  *BOWEL PROBLEMS  UNUSUAL RASH Items with * indicate a potential emergency and should be followed up as soon as possible.  Feel free to call the clinic should you have any questions or concerns. The clinic phone number is [phone].  Please show the CHEMO ALERT CARD at check-in to the Emergency Department and triage nurse.   

## 2020-03-31 NOTE — Progress Notes (Signed)
Shortly after benadryl administration, pt appeared to be more confused and sleeping intermittently. Has removed oxygen and mask on multiple occasions needing reminders to reapply. Pt also noted to be talking to self at multiple times throughout shift. At approximatly 3:30 PM, pt was sleeping when he awaked, stood up, pulled pants down, and voided all over infusion room floor. Pt could not be redirected to stop. He sat back down and returned to sleep. Soiled clothing was removed and disposable pants and briefs applied. Family was notified to inform them of the incident. Concern with current benadryl dose as too high was reported to pharmacy and Cassandra Heilingotter PA.

## 2020-03-31 NOTE — Progress Notes (Signed)
[facility] for referral for home health.  Pending call back with acceptance.

## 2020-03-31 NOTE — Telephone Encounter (Signed)
Patient's sister Aman Bonet) questioned injection appt for Friday, 04/03/2020. States she thought appt would be on Thursday. In Basket message sent to [facility], PA-C to obtain clarification. Will call [facility] with clarification once received.

## 2020-03-31 NOTE — Progress Notes (Signed)
Faxed referral request to [facility] [phone]

## 2020-04-01 ENCOUNTER — Emergency Department (HOSPITAL_COMMUNITY): Payer: Medicare PPO

## 2020-04-01 ENCOUNTER — Other Ambulatory Visit: Payer: Self-pay

## 2020-04-01 ENCOUNTER — Inpatient Hospital Stay (HOSPITAL_COMMUNITY)
Admission: EM | Admit: 2020-04-01 | Discharge: 2020-04-09 | DRG: 189 | Disposition: A | Discharge status: Hospice | Payer: Medicare PPO | Attending: Family Medicine | Admitting: Family Medicine

## 2020-04-01 ENCOUNTER — Encounter (HOSPITAL_COMMUNITY): Payer: Self-pay

## 2020-04-01 ENCOUNTER — Inpatient Hospital Stay (HOSPITAL_COMMUNITY): Payer: Medicare PPO

## 2020-04-01 DIAGNOSIS — C3491 Malignant neoplasm of unspecified part of right bronchus or lung: Secondary | ICD-10-CM

## 2020-04-01 DIAGNOSIS — Z955 Presence of coronary angioplasty implant and graft: Secondary | ICD-10-CM

## 2020-04-01 DIAGNOSIS — D539 Nutritional anemia, unspecified: Secondary | ICD-10-CM | POA: Diagnosis present

## 2020-04-01 DIAGNOSIS — J9819 Other pulmonary collapse: Secondary | ICD-10-CM | POA: Diagnosis present

## 2020-04-01 DIAGNOSIS — J9602 Acute respiratory failure with hypercapnia: Secondary | ICD-10-CM | POA: Diagnosis not present

## 2020-04-01 DIAGNOSIS — R0602 Shortness of breath: Principal | ICD-10-CM

## 2020-04-01 DIAGNOSIS — Z515 Encounter for palliative care: Secondary | ICD-10-CM | POA: Diagnosis not present

## 2020-04-01 DIAGNOSIS — E119 Type 2 diabetes mellitus without complications: Secondary | ICD-10-CM | POA: Diagnosis present

## 2020-04-01 DIAGNOSIS — E874 Mixed disorder of acid-base balance: Secondary | ICD-10-CM | POA: Diagnosis present

## 2020-04-01 DIAGNOSIS — J9622 Acute and chronic respiratory failure with hypercapnia: Secondary | ICD-10-CM | POA: Diagnosis present

## 2020-04-01 DIAGNOSIS — Z20822 Contact with and (suspected) exposure to covid-19: Secondary | ICD-10-CM | POA: Diagnosis present

## 2020-04-01 DIAGNOSIS — E875 Hyperkalemia: Secondary | ICD-10-CM | POA: Diagnosis present

## 2020-04-01 DIAGNOSIS — M109 Gout, unspecified: Secondary | ICD-10-CM | POA: Diagnosis present

## 2020-04-01 DIAGNOSIS — J189 Pneumonia, unspecified organism: Secondary | ICD-10-CM | POA: Diagnosis present

## 2020-04-01 DIAGNOSIS — Z7189 Other specified counseling: Secondary | ICD-10-CM

## 2020-04-01 DIAGNOSIS — R579 Shock, unspecified: Secondary | ICD-10-CM | POA: Diagnosis not present

## 2020-04-01 DIAGNOSIS — J9601 Acute respiratory failure with hypoxia: Secondary | ICD-10-CM | POA: Diagnosis not present

## 2020-04-01 DIAGNOSIS — E785 Hyperlipidemia, unspecified: Secondary | ICD-10-CM | POA: Diagnosis present

## 2020-04-01 DIAGNOSIS — I2510 Atherosclerotic heart disease of native coronary artery without angina pectoris: Secondary | ICD-10-CM | POA: Diagnosis not present

## 2020-04-01 DIAGNOSIS — M1A9XX0 Chronic gout, unspecified, without tophus (tophi): Secondary | ICD-10-CM | POA: Diagnosis not present

## 2020-04-01 DIAGNOSIS — C3411 Malignant neoplasm of upper lobe, right bronchus or lung: Secondary | ICD-10-CM | POA: Diagnosis present

## 2020-04-01 DIAGNOSIS — J440 Chronic obstructive pulmonary disease with acute lower respiratory infection: Secondary | ICD-10-CM | POA: Diagnosis present

## 2020-04-01 DIAGNOSIS — I10 Essential (primary) hypertension: Secondary | ICD-10-CM | POA: Diagnosis not present

## 2020-04-01 DIAGNOSIS — E873 Alkalosis: Secondary | ICD-10-CM | POA: Diagnosis not present

## 2020-04-01 DIAGNOSIS — G9341 Metabolic encephalopathy: Secondary | ICD-10-CM | POA: Diagnosis not present

## 2020-04-01 DIAGNOSIS — Z923 Personal history of irradiation: Secondary | ICD-10-CM | POA: Diagnosis not present

## 2020-04-01 DIAGNOSIS — Z7902 Long term (current) use of antithrombotics/antiplatelets: Secondary | ICD-10-CM

## 2020-04-01 DIAGNOSIS — Z79899 Other long term (current) drug therapy: Secondary | ICD-10-CM

## 2020-04-01 DIAGNOSIS — J441 Chronic obstructive pulmonary disease with (acute) exacerbation: Secondary | ICD-10-CM | POA: Diagnosis not present

## 2020-04-01 DIAGNOSIS — Z9221 Personal history of antineoplastic chemotherapy: Secondary | ICD-10-CM

## 2020-04-01 DIAGNOSIS — Z6829 Body mass index (BMI) 29.0-29.9, adult: Secondary | ICD-10-CM

## 2020-04-01 DIAGNOSIS — Z87891 Personal history of nicotine dependence: Secondary | ICD-10-CM

## 2020-04-01 DIAGNOSIS — Z9981 Dependence on supplemental oxygen: Secondary | ICD-10-CM

## 2020-04-01 DIAGNOSIS — R0689 Other abnormalities of breathing: Secondary | ICD-10-CM | POA: Diagnosis not present

## 2020-04-01 DIAGNOSIS — R531 Weakness: Secondary | ICD-10-CM | POA: Diagnosis not present

## 2020-04-01 DIAGNOSIS — E662 Morbid (severe) obesity with alveolar hypoventilation: Secondary | ICD-10-CM | POA: Diagnosis present

## 2020-04-01 DIAGNOSIS — J9621 Acute and chronic respiratory failure with hypoxia: Principal | ICD-10-CM | POA: Diagnosis present

## 2020-04-01 DIAGNOSIS — Z66 Do not resuscitate: Secondary | ICD-10-CM | POA: Diagnosis not present

## 2020-04-01 DIAGNOSIS — J9620 Acute and chronic respiratory failure, unspecified whether with hypoxia or hypercapnia: Secondary | ICD-10-CM | POA: Diagnosis present

## 2020-04-01 DIAGNOSIS — K5900 Constipation, unspecified: Secondary | ICD-10-CM | POA: Diagnosis not present

## 2020-04-01 DIAGNOSIS — R410 Disorientation, unspecified: Secondary | ICD-10-CM | POA: Diagnosis not present

## 2020-04-01 LAB — CBC WITH DIFFERENTIAL/PLATELET
Abs Immature Granulocytes: 0.31 10*3/uL — ABNORMAL HIGH (ref 0.00–0.07)
Basophils Absolute: 0.1 10*3/uL (ref 0.0–0.1)
Basophils Relative: 0 %
Eosinophils Absolute: 0 10*3/uL (ref 0.0–0.5)
Eosinophils Relative: 0 %
HCT: 40.5 % (ref 39.0–52.0)
Hemoglobin: 11.7 g/dL — ABNORMAL LOW (ref 13.0–17.0)
Immature Granulocytes: 1 %
Lymphocytes Relative: 1 %
Lymphs Abs: 0.2 10*3/uL — ABNORMAL LOW (ref 0.7–4.0)
MCH: 30.2 pg (ref 26.0–34.0)
MCHC: 28.9 g/dL — ABNORMAL LOW (ref 30.0–36.0)
MCV: 104.7 fL — ABNORMAL HIGH (ref 80.0–100.0)
Monocytes Absolute: 1.2 10*3/uL — ABNORMAL HIGH (ref 0.1–1.0)
Monocytes Relative: 6 %
Neutro Abs: 20.4 10*3/uL — ABNORMAL HIGH (ref 1.7–7.7)
Neutrophils Relative %: 92 %
Platelets: 373 10*3/uL (ref 150–400)
RBC: 3.87 MIL/uL — ABNORMAL LOW (ref 4.22–5.81)
RDW: 15.7 % — ABNORMAL HIGH (ref 11.5–15.5)
WBC: 22.2 10*3/uL — ABNORMAL HIGH (ref 4.0–10.5)
nRBC: 0.1 % (ref 0.0–0.2)

## 2020-04-01 LAB — BLOOD GAS, ARTERIAL
Acid-Base Excess: 6.9 mmol/L — ABNORMAL HIGH (ref 0.0–2.0)
Bicarbonate: 38.7 mmol/L — ABNORMAL HIGH (ref 20.0–28.0)
O2 Saturation: 95.7 %
Patient temperature: 98.6
pCO2 arterial: 116 mmHg (ref 32.0–48.0)
pH, Arterial: 7.15 — CL (ref 7.350–7.450)
pO2, Arterial: 94.8 mmHg (ref 83.0–108.0)

## 2020-04-01 LAB — BASIC METABOLIC PANEL
Anion gap: 10 (ref 5–15)
BUN: 16 mg/dL (ref 8–23)
CO2: 38 mmol/L — ABNORMAL HIGH (ref 22–32)
Calcium: 8.7 mg/dL — ABNORMAL LOW (ref 8.9–10.3)
Chloride: 92 mmol/L — ABNORMAL LOW (ref 98–111)
Creatinine, Ser: 0.93 mg/dL (ref 0.61–1.24)
GFR calc Af Amer: >60 mL/min (ref >60)
GFR calc non Af Amer: >60 mL/min (ref >60)
Glucose, Bld: 136 mg/dL — ABNORMAL HIGH (ref 70–99)
Potassium: 5.2 mmol/L — ABNORMAL HIGH (ref 3.5–5.1)
Sodium: 140 mmol/L (ref 135–145)

## 2020-04-01 LAB — SARS CORONAVIRUS 2 (TAT 6-24 HRS): SARS Coronavirus 2: NEGATIVE

## 2020-04-01 LAB — URINALYSIS, ROUTINE W REFLEX MICROSCOPIC
Bilirubin Urine: NEGATIVE
Glucose, UA: NEGATIVE mg/dL
Hgb urine dipstick: NEGATIVE
Ketones, ur: NEGATIVE mg/dL
Leukocytes,Ua: NEGATIVE
Nitrite: NEGATIVE
Protein, ur: NEGATIVE mg/dL
Specific Gravity, Urine: 1.019 (ref 1.005–1.030)
pH: 5 (ref 5.0–8.0)

## 2020-04-01 LAB — LACTIC ACID, PLASMA: Lactic Acid, Venous: 0.9 mmol/L (ref 0.5–1.9)

## 2020-04-01 LAB — GLUCOSE, CAPILLARY: Glucose-Capillary: 113 mg/dL — ABNORMAL HIGH (ref 70–99)

## 2020-04-01 LAB — BRAIN NATRIURETIC PEPTIDE: B Natriuretic Peptide: 142 pg/mL — ABNORMAL HIGH (ref 0.0–100.0)

## 2020-04-01 LAB — TROPONIN I (HIGH SENSITIVITY)
Troponin I (High Sensitivity): 14 ng/L (ref <18)
Troponin I (High Sensitivity): 18 ng/L — ABNORMAL HIGH (ref <18)

## 2020-04-01 LAB — MRSA PCR SCREENING: MRSA by PCR: NEGATIVE

## 2020-04-01 MED ORDER — METHYLPREDNISOLONE SODIUM SUCC 125 MG IJ SOLR
125.0000 mg | Freq: Once | INTRAMUSCULAR | Status: AC
  Administered 2020-04-01: 125 mg via INTRAVENOUS
  Filled 2020-04-01: qty 2

## 2020-04-01 MED ORDER — CHLORHEXIDINE GLUCONATE CLOTH 2 % EX PADS: 6.0000 | MEDICATED_PAD | Freq: Every day | CUTANEOUS | Status: DC

## 2020-04-01 MED ORDER — SODIUM CHLORIDE 0.9% FLUSH: 10.0000 mL | INTRAVENOUS | Status: DC | PRN

## 2020-04-01 MED ORDER — ATORVASTATIN CALCIUM 40 MG PO TABS
80.0000 mg | ORAL_TABLET | Freq: Every day | ORAL | Status: DC
  Administered 2020-04-02 – 2020-04-08 (×6): 80 mg via ORAL
  Filled 2020-04-01 (×7): qty 2

## 2020-04-01 MED ORDER — POLYETHYLENE GLYCOL 3350 17 G PO PACK
17.0000 g | PACK | Freq: Every day | ORAL | Status: DC | PRN
  Administered 2020-04-03 – 2020-04-07 (×3): 17 g via ORAL
  Filled 2020-04-01 (×3): qty 1

## 2020-04-01 MED ORDER — BRIMONIDINE TARTRATE 0.15 % OP SOLN
1.0000 [drp] | Freq: Two times a day (BID) | OPHTHALMIC | Status: DC
  Administered 2020-04-01 – 2020-04-09 (×16): 1 [drp] via OPHTHALMIC
  Filled 2020-04-01 (×2): qty 5

## 2020-04-01 MED ORDER — SODIUM CHLORIDE 0.9% FLUSH
10.0000 mL | Freq: Two times a day (BID) | INTRAVENOUS | Status: DC
  Administered 2020-04-01 – 2020-04-09 (×12): 10 mL

## 2020-04-01 MED ORDER — VANCOMYCIN HCL 1750 MG/350ML IV SOLN
1750.0000 mg | INTRAVENOUS | Status: DC
  Administered 2020-04-02: 1750 mg via INTRAVENOUS
  Filled 2020-04-01: qty 350

## 2020-04-01 MED ORDER — AZITHROMYCIN 250 MG PO TABS: 500.0000 mg | ORAL_TABLET | Freq: Every day | ORAL | Status: DC

## 2020-04-01 MED ORDER — IPRATROPIUM-ALBUTEROL 0.5-2.5 (3) MG/3ML IN SOLN
3.0000 mL | Freq: Two times a day (BID) | RESPIRATORY_TRACT | Status: DC
  Administered 2020-04-02 – 2020-04-05 (×7): 3 mL via RESPIRATORY_TRACT
  Filled 2020-04-01 (×7): qty 3

## 2020-04-01 MED ORDER — LORAZEPAM 2 MG/ML IJ SOLN
1.0000 mg | INTRAMUSCULAR | Status: AC | PRN
  Administered 2020-04-01 – 2020-04-03 (×2): 1 mg via INTRAVENOUS
  Filled 2020-04-01 (×2): qty 1

## 2020-04-01 MED ORDER — IOHEXOL 350 MG/ML SOLN
100.0000 mL | Freq: Once | INTRAVENOUS | Status: AC | PRN
  Administered 2020-04-01: 200 mL via INTRAVENOUS

## 2020-04-01 MED ORDER — METHYLPREDNISOLONE SODIUM SUCC 125 MG IJ SOLR
60.0000 mg | Freq: Three times a day (TID) | INTRAMUSCULAR | Status: DC
  Administered 2020-04-01 – 2020-04-04 (×9): 60 mg via INTRAVENOUS
  Filled 2020-04-01 (×6): qty 2

## 2020-04-01 MED ORDER — PROCHLORPERAZINE MALEATE 10 MG PO TABS
10.0000 mg | ORAL_TABLET | Freq: Four times a day (QID) | ORAL | Status: DC | PRN
  Administered 2020-04-05: 10 mg via ORAL
  Filled 2020-04-01: qty 1

## 2020-04-01 MED ORDER — IPRATROPIUM-ALBUTEROL 0.5-2.5 (3) MG/3ML IN SOLN
3.0000 mL | Freq: Four times a day (QID) | RESPIRATORY_TRACT | Status: DC
  Administered 2020-04-01 (×2): 3 mL via RESPIRATORY_TRACT
  Filled 2020-04-01 (×2): qty 3

## 2020-04-01 MED ORDER — CHLORHEXIDINE GLUCONATE 0.12 % MT SOLN
15.0000 mL | Freq: Two times a day (BID) | OROMUCOSAL | Status: DC
  Administered 2020-04-02 – 2020-04-06 (×10): 15 mL via OROMUCOSAL
  Filled 2020-04-01 (×11): qty 15

## 2020-04-01 MED ORDER — ALLOPURINOL 100 MG PO TABS
100.0000 mg | ORAL_TABLET | Freq: Every day | ORAL | Status: DC
  Administered 2020-04-02 – 2020-04-09 (×8): 100 mg via ORAL
  Filled 2020-04-01 (×8): qty 1

## 2020-04-01 MED ORDER — ENOXAPARIN SODIUM 40 MG/0.4ML ~~LOC~~ SOLN
40.0000 mg | SUBCUTANEOUS | Status: DC
  Administered 2020-04-01 – 2020-04-08 (×8): 40 mg via SUBCUTANEOUS
  Filled 2020-04-01 (×6): qty 0.4

## 2020-04-01 MED ORDER — ORAL CARE MOUTH RINSE
15.0000 mL | Freq: Two times a day (BID) | OROMUCOSAL | Status: DC
  Administered 2020-04-01 – 2020-04-06 (×10): 15 mL via OROMUCOSAL

## 2020-04-01 MED ORDER — SODIUM CHLORIDE (PF) 0.9 % IJ SOLN
INTRAMUSCULAR | Status: AC
  Filled 2020-04-01: qty 50

## 2020-04-01 MED ORDER — ALBUTEROL SULFATE (2.5 MG/3ML) 0.083% IN NEBU: 2.5000 mg | INHALATION_SOLUTION | RESPIRATORY_TRACT | Status: DC | PRN

## 2020-04-01 MED ORDER — ACETAMINOPHEN 325 MG PO TABS
650.0000 mg | ORAL_TABLET | Freq: Four times a day (QID) | ORAL | Status: DC | PRN
  Administered 2020-04-05 (×2): 650 mg via ORAL
  Filled 2020-04-01 (×2): qty 2

## 2020-04-01 MED ORDER — CLOPIDOGREL BISULFATE 75 MG PO TABS
75.0000 mg | ORAL_TABLET | Freq: Every day | ORAL | Status: DC
  Administered 2020-04-02 – 2020-04-09 (×8): 75 mg via ORAL
  Filled 2020-04-01 (×8): qty 1

## 2020-04-01 MED ORDER — CHLORHEXIDINE GLUCONATE CLOTH 2 % EX PADS
6.0000 | MEDICATED_PAD | Freq: Every day | CUTANEOUS | Status: DC
  Administered 2020-04-01 – 2020-04-09 (×8): 6 via TOPICAL

## 2020-04-01 MED ORDER — ONDANSETRON HCL 4 MG/2ML IJ SOLN: 4.0000 mg | Freq: Four times a day (QID) | INTRAMUSCULAR | Status: DC | PRN

## 2020-04-01 MED ORDER — BUDESONIDE 0.25 MG/2ML IN SUSP
0.2500 mg | Freq: Two times a day (BID) | RESPIRATORY_TRACT | Status: DC
  Administered 2020-04-01 – 2020-04-08 (×14): 0.25 mg via RESPIRATORY_TRACT
  Filled 2020-04-01 (×14): qty 2

## 2020-04-01 MED ORDER — ACETAMINOPHEN 650 MG RE SUPP: 650.0000 mg | Freq: Four times a day (QID) | RECTAL | Status: DC | PRN

## 2020-04-01 MED ORDER — SODIUM CHLORIDE 0.9 % IV BOLUS
1000.0000 mL | Freq: Once | INTRAVENOUS | Status: AC
  Administered 2020-04-01: 1000 mL via INTRAVENOUS

## 2020-04-01 MED ORDER — DOCUSATE SODIUM 100 MG PO CAPS
100.0000 mg | ORAL_CAPSULE | Freq: Every day | ORAL | Status: DC
  Administered 2020-04-02 – 2020-04-09 (×8): 100 mg via ORAL
  Filled 2020-04-01 (×8): qty 1

## 2020-04-01 MED ORDER — PANTOPRAZOLE SODIUM 40 MG PO TBEC
40.0000 mg | DELAYED_RELEASE_TABLET | Freq: Every day | ORAL | Status: DC
  Administered 2020-04-02 – 2020-04-09 (×8): 40 mg via ORAL
  Filled 2020-04-01 (×8): qty 1

## 2020-04-01 MED ORDER — VANCOMYCIN HCL 2000 MG/400ML IV SOLN
2000.0000 mg | Freq: Once | INTRAVENOUS | Status: AC
  Administered 2020-04-01: 2000 mg via INTRAVENOUS
  Filled 2020-04-01: qty 400

## 2020-04-01 MED ORDER — CLOPIDOGREL BISULFATE 75 MG PO TABS
75.0000 mg | ORAL_TABLET | Freq: Every day | ORAL | Status: DC
  Filled 2020-04-01: qty 1

## 2020-04-01 MED ORDER — FLUTICASONE-UMECLIDIN-VILANT 100-62.5-25 MCG/INH IN AEPB: 1.0000 | INHALATION_SPRAY | Freq: Every day | RESPIRATORY_TRACT | Status: DC

## 2020-04-01 MED ORDER — SODIUM CHLORIDE 0.9 % IV SOLN: 500.0000 mg | INTRAVENOUS | Status: DC

## 2020-04-01 MED ORDER — SODIUM CHLORIDE 0.9 % IV SOLN
2.0000 g | Freq: Three times a day (TID) | INTRAVENOUS | Status: DC
  Administered 2020-04-01 – 2020-04-08 (×21): 2 g via INTRAVENOUS
  Filled 2020-04-01 (×23): qty 2

## 2020-04-01 MED ORDER — TAMSULOSIN HCL 0.4 MG PO CAPS
0.4000 mg | ORAL_CAPSULE | Freq: Every day | ORAL | Status: DC
  Administered 2020-04-02 – 2020-04-09 (×8): 0.4 mg via ORAL
  Filled 2020-04-01 (×8): qty 1

## 2020-04-01 MED ORDER — METOPROLOL SUCCINATE ER 25 MG PO TB24
25.0000 mg | ORAL_TABLET | Freq: Every day | ORAL | Status: DC
  Administered 2020-04-02 – 2020-04-08 (×6): 25 mg via ORAL
  Filled 2020-04-01 (×8): qty 1

## 2020-04-01 MED ORDER — ONDANSETRON HCL 4 MG PO TABS: 4.0000 mg | ORAL_TABLET | Freq: Four times a day (QID) | ORAL | Status: DC | PRN

## 2020-04-01 MED ORDER — ASPIRIN EC 81 MG PO TBEC
81.0000 mg | DELAYED_RELEASE_TABLET | Freq: Every day | ORAL | Status: DC
  Administered 2020-04-02 – 2020-04-08 (×6): 81 mg via ORAL
  Filled 2020-04-01 (×6): qty 1

## 2020-04-01 NOTE — ED Triage Notes (Signed)
Per ems: Pt coming from home c/o shortness of breath and weakness. Had chemo yesterday. Baseline is 2L [place] and was 81% at home. No N/V/D, chest pain or fever.   140/100 120 hr 81 % 2L and 97% 4 L 20 RR

## 2020-04-01 NOTE — ED Provider Notes (Signed)
[facility] DEPT Provider Note   CSN: [id number] Arrival date & time: 04/01/20  0518     History Chief Complaint  Patient presents with  . Shortness of Breath    Ryan Contreras is a 79 y.o. male.  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  Patient reports he had chemotherapy yesterday.  He just generally does not feel well and states he feels weak.  He reports worsening shortness of breath.  At baseline he is on 2 L of oxygen and was found by EMS to be 81%.  He was increased to 4 with oxygen saturations of 95 to 97%.  He has not had any chest pain.  Denies fevers, cough.  Denies nausea, vomiting, diarrhea, abdominal pain.  Does report decreased p.o. intake.  No known Covid exposures.  Past Medical History:  Diagnosis Date  . Arthritis   . CAD, NATIVE VESSEL    coronary stent x1-tx. Plavix use.  Marland Kitchen COPD (chronic obstructive pulmonary disease) ([place])    due to 50+ years Smoking.  Marland Kitchen Dyspnea    wears oxygen PRN  . Gastrointestinal bleeding, upper 04/29/2016  . Gout, unspecified    foot  . HYPERLIPIDEMIA   . HYPERTENSION   . Lung mass   . NSCL ca dx'd 12/2015   Lung  . On supplemental oxygen by nasal cannula    PRN  . PEPTIC ULCER DISEASE   . Pneumonia   . TOBACCO ABUSE   . Type 2 diabetes mellitus ([place])    denies on 01-06-16    Patient Active Problem List   Diagnosis Date Noted  . Tremor 03/31/2020  . Constipation 03/31/2020  . Allergic rhinitis 03/17/2020  . Stage IV squamous cell carcinoma of right lung ([place]) 03/03/2020  . Encounter for antineoplastic immunotherapy 03/03/2020  . Goals of care, counseling/discussion 03/03/2020  . Acute on chronic respiratory failure with hypoxia and hypercapnia ([place]) 02/29/2020  . Chronic respiratory failure with hypoxia ([place]) 02/18/2020  .  History of colonic polyps   . Benign neoplasm of transverse colon   . 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 Memory loss 06/26/2017  . Hypercapnic respiratory failure ([place]) 11/22/2016  . GI bleed 05/02/2016  . Anemia 05/02/2016  . Gastrointestinal bleeding, upper 04/29/2016  . Encounter for antineoplastic chemotherapy 02/15/2016  . Prostatitis, chronic 01/23/2016  . Malignant neoplasm of right upper lobe of lung ([place]) 01/21/2016  . Hilar adenopathy 01/11/2016  . Enlargement of lymph nodes   . Other nonspecific abnormal finding of lung field   . Pulmonary nodules 11/26/2015  . COPD  GOLD C 11/02/2015  . Cigarette smoker 11/02/2015  . Dyspnea 09/11/2015  . Edema 08/14/2015  . Diabetes ([place]) 04/13/2015  . Wart viral 01/30/2015  . Wellness examination 11/14/2014  . Neoplasm of uncertain behavior of skin 12/31/2012  . Bruise 07/21/2012  . Otitis media 04/19/2012  . Otitis externa 04/19/2012  . Cough 01/06/2012  . Encounter for long-term (current) use of other medications 12/16/2011  . PSA, INCREASED 08/11/2010  . SKIN RASH 07/14/2010  . TOBACCO ABUSE 03/15/2010  . CAD, NATIVE VESSEL 03/15/2010  . OTHER MALAISE AND FATIGUE 03/15/2010  . OTHER SPEC HYPERTROPHIC&ATROPHIC CONDITION SKIN 10/01/2009  . HYPOKALEMIA 06/03/2009  . Hyperlipidemia 05/14/2008  . Essential hypertension 05/14/2008  . Coronary atherosclerosis 05/14/2008  .  PUD (peptic ulcer disease) 05/14/2008  . LEG PAIN 05/14/2008  . Gout 01/21/2008    Past Surgical History:  Procedure Laterality Date  . BRONCHIAL BIOPSY  02/25/2020   Procedure: BRONCHIAL BIOPSIES;  Surgeon: Collene Gobble, MD;  Location: [facility] ENDOSCOPY;  Service: Pulmonary;;  . BRONCHIAL BRUSHINGS  02/25/2020   Procedure: BRONCHIAL BRUSHINGS;  Surgeon: Collene Gobble, MD;  Location: [facility] ENDOSCOPY;  Service: Pulmonary;;  . CARDIAC CATHETERIZATION     '06-stent placed, 6'13 - Dr. Lizbeth Bark follows  . COLONOSCOPY WITH PROPOFOL  N/A 09/24/2019   Procedure: COLONOSCOPY WITH PROPOFOL;  Surgeon: Irene Shipper, MD;  Location: [facility] ENDOSCOPY;  Service: Endoscopy;  Laterality: N/A;  . CORONARY STENT PLACEMENT     s/p  . ENDOBRONCHIAL ULTRASOUND Bilateral 01/11/2016   Procedure: ENDOBRONCHIAL ULTRASOUND;  Surgeon: Collene Gobble, MD;  Location: [facility] ENDOSCOPY;  Service: Endoscopy;  Laterality: Bilateral;  . ESOPHAGOGASTRODUODENOSCOPY (EGD) WITH PROPOFOL N/A 09/24/2019   Procedure: ESOPHAGOGASTRODUODENOSCOPY (EGD) WITH PROPOFOL;  Surgeon: Irene Shipper, MD;  Location: [facility] ENDOSCOPY;  Service: Endoscopy;  Laterality: N/A;  . IR IMAGING GUIDED PORT INSERTION  03/23/2020  . POLYPECTOMY  09/24/2019   Procedure: POLYPECTOMY;  Surgeon: Irene Shipper, MD;  Location: [facility] ENDOSCOPY;  Service: Endoscopy;;  . VIDEO BRONCHOSCOPY  02/25/2020   Procedure: VIDEO BRONCHOSCOPY WITHOUT FLUORO;  Surgeon: Collene Gobble, MD;  Location: [facility] ENDOSCOPY;  Service: Pulmonary;;  . VIDEO BRONCHOSCOPY WITH ENDOBRONCHIAL NAVIGATION Right 03/07/2018   Procedure: VIDEO BRONCHOSCOPY WITH ENDOBRONCHIAL NAVIGATION;  Surgeon: Collene Gobble, MD;  Location: [facility] OR;  Service: Thoracic;  Laterality: Right;       Family History  Problem Relation Age of Onset  . Breast cancer Sister   . Cancer Brother        Uncertain type  . Arthritis Mother   . Hypertension Father   . Heart attack Father   . Colon cancer Neg Hx   . Liver cancer Neg Hx   . Stomach cancer Neg Hx     Social History   Tobacco Use  . Smoking status: Former Smoker    Packs/day: 0.50    Years: 50.00    Pack years: 25.00    Types: Cigarettes    Quit date: 01/12/2018    Years since quitting: 2.2  . Smokeless tobacco: Never Used  . Tobacco comment: stopped smoking cigarettes 2/ 2019  Substance Use Topics  . Alcohol use: Not Currently    Alcohol/week: 0.0 standard drinks  . Drug use: No    Home Medications Prior to Admission medications   Medication Sig Start Date End Date Taking?  Authorizing Provider  acetaminophen (TYLENOL) 500 MG tablet Take 250 mg by mouth every 6 (six) hours as needed for moderate pain or headache.     [provider]  allopurinol (ZYLOPRIM) 100 MG tablet TAKE 1 TABLET BY MOUTH EVERY DAY Patient taking differently: Take 100 mg by mouth daily.  01/07/19   Renato Shin, MD  aspirin 81 MG tablet Take 1 tablet (81 mg total) by mouth at bedtime. Restart on 03/09/18 03/07/18   Collene Gobble, MD  atorvastatin (LIPITOR) 80 MG tablet TAKE 1 TABLET BY MOUTH EVERY DAY Patient taking differently: Take 80 mg by mouth daily.  11/01/19   Fay Records, MD  brimonidine (ALPHAGAN) 0.15 % ophthalmic solution Place 1 drop into both eyes in the morning and at bedtime.  01/15/20   [provider]  clopidogrel (PLAVIX) 75 MG tablet Take 1  tablet (75 mg total) by mouth daily. Okay to restart this medication on 02/26/2020 02/25/20   Collene Gobble, MD  docusate sodium (COLACE) 100 MG capsule Take 100 mg by mouth daily.     [provider]  eplerenone (INSPRA) 25 MG tablet TAKE 1 TABLET BY MOUTH EVERY DAY 03/10/20   Byrum, Rose Fillers, MD  HYDROcodone-homatropine [facility]) 5-1.5 MG/5ML syrup Take 5 mLs by mouth every 6 (six) hours as needed for cough. 02/26/20   Byrum, Rose Fillers, MD  KLOR-CON M20 20 MEQ tablet TAKE 1 TABLET BY MOUTH DAILY Patient taking differently: Take 20 mEq by mouth daily.  09/09/18   Renato Shin, MD  lidocaine-prilocaine (EMLA) cream Apply to the Port-A-Cath site 30 minutes before chemotherapy. 03/03/20   Curt Bears, MD  metoprolol succinate (TOPROL-XL) 25 MG 24 hr tablet Take 25 mg by mouth daily.  08/05/19   [provider]  Na Sulfate-K Sulfate-Mg Sulf 17.5-3.13-1.6 GM/177ML SOLN Suprep-Use as directed 09/23/19   Irene Shipper, MD  OXYGEN Inhale 2 L into the lungs at bedtime.    [provider]  pantoprazole (PROTONIX) 40 MG tablet TAKE 40 MG BY MOUTH EVERY DAY Patient taking differently: Take 40 mg by mouth  daily.  03/07/18   Collene Gobble, MD  polyethylene glycol (MIRALAX / GLYCOLAX) 17 g packet Take 17 g by mouth daily as needed for mild constipation or moderate constipation.    [provider]  PROAIR HFA [phone] Base) MCG/ACT inhaler Inhale 2 puffs into the lungs every 6 (six) hours as needed for wheezing or shortness of breath. 02/17/18   [provider]  prochlorperazine (COMPAZINE) 10 MG tablet Take 1 tablet (10 mg total) by mouth every 6 (six) hours as needed for nausea or vomiting. 03/03/20   Curt Bears, MD  sildenafil (REVATIO) 20 MG tablet Take 100 mg by mouth daily as needed for erectile dysfunction. 09/02/19   [provider]  TRELEGY ELLIPTA 100-62.5-25 MCG/INH AEPB Inhale 1 puff into the lungs daily. 09/17/19   Collene Gobble, MD    Allergies    Patient has no known allergies.  Review of Systems   Review of Systems  Constitutional: Negative for fever.  Respiratory: Positive for shortness of breath. Negative for cough.   Cardiovascular: Negative for chest pain.  Gastrointestinal: Negative for abdominal pain, nausea and vomiting.  Genitourinary: Negative for dysuria.  Neurological: Positive for weakness.  All other systems reviewed and are negative.   Physical Exam Updated Vital Signs BP 124/79   Pulse (!) 116   Temp 97.9 F (36.6 C) (Rectal)   Resp 20   SpO2 98%   Physical Exam Vitals and nursing note reviewed.  Constitutional:      Comments: Chronically ill-appearing, no acute distress  HENT:     Head: Normocephalic and atraumatic.     Mouth/Throat:     Comments: Mucous membranes dry, poor dentition Eyes:     Pupils: Pupils are equal, round, and reactive to light.  Cardiovascular:     Rate and Rhythm: Regular rhythm. Tachycardia present.     Heart sounds: Normal heart sounds. No murmur.  Pulmonary:     Effort: Pulmonary effort is normal. No respiratory distress.     Breath sounds: Normal breath sounds. No wheezing.     Comments:  Diminished breath sounds in all lung fields, no significant wheezing Abdominal:     General: Bowel sounds are normal.     Palpations: Abdomen is soft.  Tenderness: There is no abdominal tenderness. There is no rebound.  Musculoskeletal:     Cervical back: Neck supple.     Comments: Trace bilateral lower extremity edema  Lymphadenopathy:     Cervical: No cervical adenopathy.  Skin:    General: Skin is warm and dry.     Comments: Large keloid over the anterior chest  Neurological:     Mental Status: He is alert and oriented to person, place, and time.  Psychiatric:        Mood and Affect: Mood normal.     ED Results / Procedures / Treatments   Labs (all labs ordered are listed, but only abnormal results are displayed) Labs Reviewed  CBC WITH DIFFERENTIAL/PLATELET - Abnormal; Notable for the following components:      Result Value   WBC 22.2 (*)    RBC 3.87 (*)    Hemoglobin 11.7 (*)    MCV 104.7 (*)    MCHC 28.9 (*)    RDW 15.7 (*)    Neutro Abs 20.4 (*)    Lymphs Abs 0.2 (*)    Monocytes Absolute 1.2 (*)    Abs Immature Granulocytes 0.31 (*)    All other components within normal limits  BASIC METABOLIC PANEL - Abnormal; Notable for the following components:   Potassium 5.2 (*)    Chloride 92 (*)    CO2 38 (*)    Glucose, Bld 136 (*)    Calcium 8.7 (*)    All other components within normal limits  BRAIN NATRIURETIC PEPTIDE - Abnormal; Notable for the following components:   B Natriuretic Peptide 142.0 (*)    All other components within normal limits  SARS CORONAVIRUS 2 (TAT 6-24 HRS)  LACTIC ACID, PLASMA  URINALYSIS, ROUTINE W REFLEX MICROSCOPIC  LACTIC ACID, PLASMA  TROPONIN I (HIGH SENSITIVITY)  TROPONIN I (HIGH SENSITIVITY)    EKG EKG Interpretation  Date/Time:  Wednesday April 01 2020 05:36:08 EDT Ventricular Rate:  119 PR Interval:    QRS Duration: 91 QT Interval:  327 QTC Calculation: 461 R Axis:   67 Text Interpretation: Sinus tachycardia  Biatrial enlargement No significant change since last tracing Confirmed by Thayer Jew [id number]) on 04/01/2020 5:55:10 AM   Radiology DG Chest 2 View  Result Date: 04/01/2020 CLINICAL DATA:  Worsening shortness of breath. History of non-small cell lung cancer. EXAM: CHEST - 2 VIEW COMPARISON:  Chest x-ray 02/28/2020 and PET-CT 02/04/2020 FINDINGS: Stable right upper lobe lung mass and surrounding extensive radiation changes with loss of volume in the right hemithorax. Slight increase in surrounding right lung density which could reflect pneumonitis or interstitial spread of tumor. The left lung remains relatively clear. Left IJ power port in good position without complicating features. No pleural effusions or pneumothorax. IMPRESSION: 1. Stable right upper lobe lung mass and surrounding radiation changes. 2. Slight increase in surrounding right lung density which could reflect pneumonitis or interstitial spread of tumor. Electronically Signed   By: Marijo Sanes M.D.   On: 04/01/2020 06:32    Procedures Procedures (including critical care time)  Medications Ordered in ED Medications  sodium chloride (PF) 0.9 % injection (has no administration in time range)  sodium chloride 0.9 % bolus 1,000 mL (1,000 mLs Intravenous New Bag/Given 04/01/20 0604)  iohexol (OMNIPAQUE) 350 MG/ML injection 100 mL (100 mLs Intravenous Contrast Given 04/01/20 5638)    ED Course  I have reviewed the triage vital signs and the nursing notes.  Pertinent labs & imaging results that were  available during my care of the patient were reviewed by me and considered in my medical decision making (see chart for details).    MDM Rules/Calculators/A&P                       Patient presents with shortness of breath, decreased p.o. intake.  He is overall chronically ill-appearing but nontoxic.  No acute respiratory distress.  He is on 4 L of oxygen with a baseline of 2 L of oxygen.  He is tachycardic.  EKG without acute  ischemic changes.  He previously was tachycardic on his EKG.  He is afebrile.  Lactate is normal.  Chest x-ray shows known right-sided pulmonary mass with some adjacent inflammation/infection.  Will obtain CT scan of the chest to further evaluate and evaluate for possible PE given his ongoing cancer.  He clinically appears dry as well.  Patient was given a liter of fluids.  We will hold off on antibiotics until further evaluation with CT.  Patient signed out to Dr. Melina Copa.  Final Clinical Impression(s) / ED Diagnoses Final diagnoses:  None    Rx / DC Orders ED Discharge Orders    None       Birdie Fetty, Barbette Hair, MD 04/01/20 [phone]

## 2020-04-01 NOTE — Consult Note (Signed)
NAME:  Ryan Contreras, MRN:  [id number], DOB:  1941-06-13, LOS: 0 ADMISSION DATE:  04/01/2020, CONSULTATION DATE:  04/01/2020 REFERRING MD:  Dr. Starla Link, CHIEF COMPLAINT: Chronic hypoxic respiratory failure  Brief History   79yo male presented with worsening shortness of breath int he setting of wosening non-small cell lung cancer and questionable post obstructive pneumonia.   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  Her primary oncologist recommended patient recently received second round of chemotherapy 4/20.  Patient reports shortness of breath has been progressive in nature and worsens with exertion.  Also reports decreased oral intake. denies any fever, chills, worsening cough, chest pain, nausea, vomiting, or diarrhea.    Patient is followed by Dr. Lamonte Sakai as primary pulmonologist for treatment of COPD Gold C, lung cancer, and allergic rhinitis.  Most recently seen in the pulmonary clinic 4/6.    Vital signs on admission significant for mild tachycardia, all other vital signs within normal limits.  Lab work significant for mild hyperkalemia, slightly elevated BNP, and leukocytosis.  CT chest on admission negative for acute PE but revealed progressive worsening of pulmonary mass with worsening right upper and right middle lobe collapse.  PCCM consulted for additional assistance in management  Past Medical History  Type 2 diabetes Tobacco abuse Lung cancer Hyperlipidemia Hypertension COPD CAD status post stent [place] [facility] Events   4/21  Consults:   PCCM  Procedures:    Significant Diagnostic Tests:  CTA chest 4/21> 1. No CT evidence for acute pulmonary embolus. 2. Continued progression of the right upper lobe pulmonary mass with worsening right upper lobe collapse/consolidation and interval development of complete collapse/consolidation of the right middle lobe. No substantial pleural effusion at this time. 3. Slight progression of the right subdiaphragmatic soft tissue lesion posterior to hepatic dome. This lesion was not hypermetabolic on previous PET-CT. Continued attention on follow-up recommended. 4. Mild progression of left hilar lymphadenopathy. 5.  Emphysema (ICD10-J43.9) and Aortic Atherosclerosis (ICD10-170.0   Micro Data:  Covid 4/21 > negative MRSA PCR 4/21 > Blood cultures 4/21 >  Antimicrobials:  Cefepime 4/21 > Vancomycin 4/21 >  Interim history/subjective:  Patient is seen lying in bed acutely altered in no acute distress. He denies any pain or dyspnea   Objective   Blood pressure 118/78, pulse (!) 116, temperature 97.9 F (36.6 C), temperature source Rectal, resp. rate (!) 27, SpO2 95 %.        Intake/Output Summary (Last 24 hours) at 04/01/2020 1301 Last data filed at 04/01/2020 0801 Gross per 24 hour  Intake 1000 ml  Output --  Net 1000 ml   There were no vitals filed for this visit.  Examination: General: Chronically ill appearing elderly male lying in bed on left side, in NAD HEENT: ETT, MM pink/moist, PERRL, has mild facial and UE tremor   Neuro: Alert to self, acutely contused,  CV: s1s2 regular rate and rhythm, no murmur, rubs, or gallops,  PULM:  Diminished breath sounds R > L, no added breath sounds, oxygen saturations 95-98 on 4L [place]  GI: soft, bowel sounds active in all 4 quadrants, non-tender, non-distended Extremities: warm/dry,  1+ non-pitting LE edema  Skin: no rashes or lesions  Resolved Hospital Problem list     Assessment & Plan:  Acute on chronic hypoxic respiratory  failure Recurrent non-small cell lung cancer Postobstructive pneumonia COPD Gold C -CTA negative for PE but reveals worsning collapse of right upper lobe and new development of right middle lobe collapse  P: Continue supplemental oxygen, SPO2 goal >88 Continue IV Solu-Medrol Continue empiric antibiotics Follow cultures Obtain SLP evaluation Head of bed elevated 30 degrees. Follow intermittent chest x-ray and ABG.   Aggressive pulmonary hygiene as able  Agree with palliative care involvement given No acute indications for bronchoscopy at this time   PCCM will continue to follow peripherally, please call is acute assistance is needed   Best practice:  Diet: Heart healthy  Pain/Anxiety/Delirium protocol (if indicated): PRNs VAP protocol (if indicated): N/A DVT prophylaxis: Lovenox GI prophylaxis: PPI Glucose control: SSI Mobility: Bedrest  Code Status: Full Family Communication: Updated patients daughter Neill Loft over the phone 4/21, verified with daughter full code status for now until family meeting can be held  Disposition: Floor   Labs   CBC: Recent Labs  Lab 03/31/20 0900 04/01/20 0552  WBC 11.8* 22.2*  NEUTROABS 8.0* 20.4*  HGB 11.0* 11.7*  HCT 37.8* 40.5  MCV 102.4* 104.7*  PLT 347 785    Basic Metabolic Panel: Recent Labs  Lab 03/31/20 0900 04/01/20 0552  NA 140 140  K 5.4* 5.2*  CL 93* 92*  CO2 39* 38*  GLUCOSE 229* 136*  BUN 10 16  CREATININE 0.90 0.93  CALCIUM 9.2 8.7*   GFR: Estimated Creatinine Clearance: 85.4 mL/min (by C-G formula based on SCr of 0.93 mg/dL). Recent Labs  Lab 03/31/20 0900 04/01/20 0552  WBC 11.8* 22.2*  LATICACIDVEN  --  0.9    Liver Function Tests: Recent Labs  Lab 03/31/20 0900  AST 23  ALT 20  ALKPHOS 111  BILITOT 0.3  PROT 7.7  ALBUMIN 2.8*   No results for input(s): LIPASE, AMYLASE in the last 168 hours. No results for input(s): AMMONIA in the last 168 hours.  ABG No results found for: PHART,  PCO2ART, PO2ART, HCO3, TCO2, ACIDBASEDEF, O2SAT   Coagulation Profile: No results for input(s): INR, PROTIME in the last 168 hours.  Cardiac Enzymes: No results for input(s): CKTOTAL, CKMB, CKMBINDEX, TROPONINI in the last 168 hours.  HbA1C: Hemoglobin A1C  Date/Time Value Ref Range Status  06/25/2019 09:46 AM 6.4 (A) 4.0 - 5.6 % Final  10/30/2018 10:00 AM 6.0 (A) 4.0 - 5.6 % Final   Hgb A1c MFr Bld  Date/Time Value Ref Range Status  08/25/2016 11:10 AM 6.7 (H) 4.6 - 6.5 % Final    Comment:    Glycemic Control Guidelines for People with Diabetes:Non Diabetic:  <6%Goal of Therapy: <7%Additional Action Suggested:  >8%   04/13/2015 08:44 AM 6.4 4.6 - 6.5 % Final    Comment:    Glycemic Control Guidelines for People with Diabetes:Non Diabetic:  <6%Goal of Therapy: <7%Additional Action Suggested:  >8%     CBG: No results for input(s): GLUCAP in the last 168 hours.  Review of Systems:   Unable to obtain due to confusion   Past Medical History  He,  has a past medical history of Arthritis, CAD, NATIVE VESSEL, COPD (chronic obstructive pulmonary disease) ([place]), Dyspnea, Gastrointestinal bleeding, upper (04/29/2016), Gout, unspecified, HYPERLIPIDEMIA, HYPERTENSION, Lung mass, NSCL ca (dx'd 12/2015), On supplemental oxygen by nasal cannula, PEPTIC ULCER DISEASE, Pneumonia, TOBACCO ABUSE, and  Type 2 diabetes mellitus ([place]).   Surgical History    Past Surgical History:  Procedure Laterality Date  . BRONCHIAL BIOPSY  02/25/2020   Procedure: BRONCHIAL BIOPSIES;  Surgeon: Collene Gobble, MD;  Location: [facility] ENDOSCOPY;  Service: Pulmonary;;  . BRONCHIAL BRUSHINGS  02/25/2020   Procedure: BRONCHIAL BRUSHINGS;  Surgeon: Collene Gobble, MD;  Location: [facility] ENDOSCOPY;  Service: Pulmonary;;  . CARDIAC CATHETERIZATION     '06-stent placed, 6'13 - Dr. Lizbeth Bark follows  . COLONOSCOPY WITH PROPOFOL N/A 09/24/2019   Procedure: COLONOSCOPY WITH PROPOFOL;  Surgeon: Irene Shipper, MD;  Location: [facility]  ENDOSCOPY;  Service: Endoscopy;  Laterality: N/A;  . CORONARY STENT PLACEMENT     s/p  . ENDOBRONCHIAL ULTRASOUND Bilateral 01/11/2016   Procedure: ENDOBRONCHIAL ULTRASOUND;  Surgeon: Collene Gobble, MD;  Location: [facility] ENDOSCOPY;  Service: Endoscopy;  Laterality: Bilateral;  . ESOPHAGOGASTRODUODENOSCOPY (EGD) WITH PROPOFOL N/A 09/24/2019   Procedure: ESOPHAGOGASTRODUODENOSCOPY (EGD) WITH PROPOFOL;  Surgeon: Irene Shipper, MD;  Location: [facility] ENDOSCOPY;  Service: Endoscopy;  Laterality: N/A;  . IR IMAGING GUIDED PORT INSERTION  03/23/2020  . POLYPECTOMY  09/24/2019   Procedure: POLYPECTOMY;  Surgeon: Irene Shipper, MD;  Location: [facility] ENDOSCOPY;  Service: Endoscopy;;  . VIDEO BRONCHOSCOPY  02/25/2020   Procedure: VIDEO BRONCHOSCOPY WITHOUT FLUORO;  Surgeon: Collene Gobble, MD;  Location[facility] ENDOSCOPY;  Service: Pulmonary;;  . VIDEO BRONCHOSCOPY WITH ENDOBRONCHIAL NAVIGATION Right 03/07/2018   Procedure: VIDEO BRONCHOSCOPY WITH ENDOBRONCHIAL NAVIGATION;  Surgeon: Collene Gobble, MD;  Location: [place];  Service: Thoracic;  Laterality: Right;     Social History   reports that he quit smoking about 2 years ago. His smoking use included cigarettes. He has a 25.00 pack-year smoking history. He has never used smokeless tobacco. He reports previous alcohol use. He reports that he does not use drugs.   Family History   His family history includes Arthritis in his mother; Breast cancer in his sister; Cancer in his brother; Heart attack in his father; Hypertension in his father. There is no history of Colon cancer, Liver cancer, or Stomach cancer.   Allergies No Known Allergies   Home Medications  Prior to Admission medications   Medication Sig Start Date End Date Taking? Authorizing Provider  acetaminophen (TYLENOL) 500 MG tablet Take 250 mg by mouth every 6 (six) hours as needed for moderate pain or headache.    Yes [provider]  allopurinol (ZYLOPRIM) 100 MG tablet TAKE 1 TABLET BY MOUTH  EVERY DAY Patient taking differently: Take 100 mg by mouth daily.  01/07/19  Yes Renato Shin, MD  atorvastatin (LIPITOR) 80 MG tablet TAKE 1 TABLET BY MOUTH EVERY DAY Patient taking differently: Take 80 mg by mouth daily.  11/01/19  Yes Fay Records, MD  brimonidine (ALPHAGAN) 0.15 % ophthalmic solution Place 1 drop into both eyes in the morning and at bedtime.  01/15/20  Yes [provider]  docusate sodium (COLACE) 100 MG capsule Take 100 mg by mouth daily.    Yes [provider]  eplerenone (INSPRA) 25 MG tablet TAKE 1 TABLET BY MOUTH EVERY DAY Patient taking differently: Take 25 mg by mouth daily.  03/10/20  Yes Collene Gobble, MD  HYDROcodone-homatropine [facility]) 5-1.5 MG/5ML syrup Take 5 mLs by mouth every 6 (six) hours as needed for cough. 02/26/20  Yes Byrum, Rose Fillers, MD  KLOR-CON M20 20 MEQ tablet TAKE 1 TABLET BY MOUTH DAILY Patient taking differently: Take 20 mEq by mouth  daily.  09/09/18  Yes Renato Shin, MD  lidocaine-prilocaine (EMLA) cream Apply to the Port-A-Cath site 30 minutes before chemotherapy. Patient taking differently: Apply 1 application topically as needed (port access). Apply to the Port-A-Cath site 30 minutes before chemotherapy. 03/03/20  Yes Curt Bears, MD  metoprolol succinate (TOPROL-XL) 25 MG 24 hr tablet Take 25 mg by mouth daily.  08/05/19  Yes [provider]  OXYGEN Inhale 2 L into the lungs continuous.    Yes [provider]  pantoprazole (PROTONIX) 40 MG tablet TAKE 40 MG BY MOUTH EVERY DAY Patient taking differently: Take 40 mg by mouth daily.  03/07/18  Yes Collene Gobble, MD  polyethylene glycol (MIRALAX / GLYCOLAX) 17 g packet Take 17 g by mouth daily as needed for mild constipation or moderate constipation.   Yes [provider]  PROAIR HFA 108 (90 Base) MCG/ACT inhaler Inhale 2 puffs into the lungs every 6 (six) hours as needed for wheezing or shortness of breath. 02/17/18  Yes [provider]    prochlorperazine (COMPAZINE) 10 MG tablet Take 1 tablet (10 mg total) by mouthevery 6 (six) hours as needed for nausea or vomiting. 03/03/20  Yes Curt Bears, MD  sildenafil (REVATIO) 20 MG tablet Take 100 mg by mouth daily as needed for erectile dysfunction. 09/02/19  Yes [provider]  TRELEGY ELLIPTA 100-62.5-25 MCG/INH AEPB Inhale 1 puff into the lungs daily. 09/17/19  Yes Collene Gobble, MD  aspirin 81 MG tablet Take 1 tablet (81 mg total) by mouth at bedtime. Restart on 03/09/18 03/07/18   Collene Gobble, MD  clopidogrel (PLAVIX) 75 MG tablet Take 1 tablet (75 mg total) by mouth daily. Okay to restart this medication on 02/26/2020 02/25/20   Collene Gobble, MD  Na Sulfate-K Sulfate-Mg Sulf 17.5-3.13-1.6 GM/177ML SOLN Suprep-Use as directed Patient not taking: Reported on 04/01/2020 09/23/19   Irene Shipper, MD  tamsulosin (FLOMAX) 0.4 MG CAPS capsule Take 0.4 mg by mouth daily. 03/27/20   [provider]     Signature:  Johnsie Cancel, NP-C [facility] Contact / Pager information can be found on Amion  04/01/2020, 2:30 PM

## 2020-04-01 NOTE — Progress Notes (Signed)
1518: RN called to assess pt. Pt found to be lethargic and unresponsive with several attempts of sternal rub shortly after admission to unit. Pt was alert and oriented on admission, so change is noted from baseline. Respiratory therapist present at time found unresponsive. After several attempts, pt responded though slow to respond. RT attempted to administer breathing treatment to patient and pt became combative and agitated pulling at tubing and urinary catheter. RN consulted charge RN who assisted with assessment.  1524: Rapid response phoned at 1524. Pt found to be tachycardic, hypertensive, and lethargic.   1527: Rapid response responded at 1527. EKG performed and placed in shadow chart.  1527: MD notified at 11. Md responded at 1529. RRT took pt off floor for stat CT head.   1559: Lab called RN at 1559 for critical ABG result. PH 7.150, CO2 was 116. RRT & MD notified.   1606: RN advised at 1606 that patient will be transferred to stepdown.

## 2020-04-01 NOTE — Progress Notes (Signed)
Pt arrived unit in yellow mews. Tachycardic, tachypneic, and hypotensive. O2 sat at 95% on 4L via [place]. Critical Care Pulmonologist Merlene Laughter, NP) present on admission to assess pt. No new orders or interventions at this time. HOB raised. Pt asleep in no distress. Will continue to monitor. Yellow mews protocol implemented

## 2020-04-01 NOTE — Progress Notes (Signed)
Pharmacy Antibiotic Note  Ryan Contreras is a 79 y.o. male admitted on 04/01/2020 with sepsis.  Pharmacy has been consulted for vancomycin and cefepime dosing.  Plan:  Vancomycin 2000 mg IV x1 then 1750 mg IV q24h    Cefepime 2 gr IV q8h   Monitor clinical course, renal function, cultures as available      Temp (24hrs), Avg:98.3 F (36.8 C), Min:97.9 F (36.6 C), Max:98.7 F (37.1 C)  Recent Labs  Lab 03/31/20 0900 04/01/20 0552  WBC 11.8* 22.2*  CREATININE 0.90 0.93  LATICACIDVEN  --  0.9    Estimated Creatinine Clearance: 85.4 mL/min (by C-G formula based on SCr of 0.93 mg/dL).    No Known Allergies  Antimicrobials this admission: 4/21 cefepime >>  4/21 vancomycin >>   Dose adjustments this admission:   Microbiology results: 4/21 BCx:  4/21 MRSA PCR:   Thank you for allowing pharmacy to be a part of this patient's care.    Royetta Asal, PharmD, BCPS 04/01/2020 11:29 AM

## 2020-04-01 NOTE — Progress Notes (Signed)
PCCM progress note  Shortly after arrival to floor patient was seen with worsening mentation including increased somnolence, this prompted a rapid response call. Rapid response team contacted primary team and obtained orders for Head CT and ABG. I was later notified of critical ABG results as follows 7.15 / 116 / 94.8 / 38.7, decision was made to transfer patient to ICU for application of BIPAP. PCCM will continue to follow along.   Johnsie Cancel, NP-C [place] [facility] [facility] Contact / Pager information can be found on Amion  04/01/2020, 4:39 PM

## 2020-04-01 NOTE — ED Provider Notes (Signed)
Signout from Ryan Contreras.  79 year old male active chemo for squamous cell lung cancer.  Here with increased shortness of breath generalized weakness.  Normally on 2 L nasal cannula and EMS found him at 81% on 2 L.  Better on 4 L.  Chest x-ray showing possible worsening of  mass.  Pending CT PE.  Disposition per results of testing. Physical Exam  BP 124/79   Pulse (!) 116   Temp 97.9 F (36.6 C) (Rectal)   Resp 20   SpO2 98%   Physical Exam  ED Course/Procedures     Procedures  MDM  CT PE showing no PE but worsening of his cancer along with worsening of right upper lobe collapse and new right middle lobe collapse.  Patient is remained tachycardic here.  Will call hospitalist for admission.  I updated his oncologist Ryan Contreras.  I will also reach out to the patient's sister and update her.  Discussed with [facility] hospitalist Ryan Contreras, who will evaluate patient for admission.  We reviewed imaging and labs.  He asked that I give the patient 125 mg of Solu-Medrol.       Ryan Rasmussen, MD 04/01/20 1754

## 2020-04-01 NOTE — Significant Event (Signed)
Rapid Response Event Note  Overview: Time Called: [place] Time: 1527 Event Type: Neurologic   Staff nurses concerned for increased lethargy after admission. Entered the room and found the patient lethargic and confused in bed.   Initial Focused Assessment: Neuro: Intermittent drowsiness. Alert to person; confused to place, time, and situation. Cardiac: sinus tach Respiratory: 14 breaths per minute, breath sounds clear and diminished in upper and lower lung fields. Paradoxical breathing noted.    Interventions: Vital signs, EKG, and ABG obtained, stat head CT obtained for altered mental status. PH on ABG 7.15; CO2 116. Transferred to stepdown for bipap.   Event Summary: Name of Physician Notified: Dr. Starla Link at 1600  Name of Consulting Physician Notified: Dr. [place] Bells at 1605  Outcome: Transferred (Comment)(transferred to ICU/SD for bipap therapy)  Event End Time: [place]

## 2020-04-01 NOTE — ED Notes (Signed)
Pt transported to CT ?

## 2020-04-01 NOTE — ED Notes (Signed)
Urine and culture sent to lab  

## 2020-04-01 NOTE — H&P (Addendum)
History and Physical    Alexandra Posadas Urda HDQ:[id number] DOB: 08-21-1941 DOA: 04/01/2020  PCP: Cyndi Bender, PA-C   Patient coming from: Home  I have personally briefly reviewed patient's old medical records in [place]  Chief Complaint: Shortness of breath  HPI: AMMAAR ENCINA is a 79 y.o. 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  Patient is a poor historian and does not provide accurate history.  I have reviewed patient's medical records and communicated with Dr. Julien Nordmann and Dr. Kyung Rudd as well.  Patient had his second cycle of current chemotherapy on 03/31/2020 and presented today to the ED with worsening shortness of breath.  At baseline, he wears 2 to 3 L of oxygen at home.  Patient complains of progressively worsening shortness of breath even with minimal exertion.  He denies fevers or worsening cough.  No chest pain.  Denies nausea, vomiting, diarrhea, abdominal pain, dysuria.  He does report decreased p.o. intake.  No known Covid exposures.  ED Course: He required 4 to 5 L oxygen via nasal cannula in the ED; his oxygen saturation was found to be 81% by EMS.  Chest x-ray showed possible worsening of mass.  CT of the chest was negative for PE but showed worsening of his cancer along with worsening of right upper lobe collapse and new right middle lobe collapse.  ED provider updated Dr. Julien Nordmann.  Hospitalist service was called to evaluate the patient.  Review of Systems: Poor historian.  Could not do full review of systems because of patient being a poor historian.   Past Medical History:  Diagnosis Date  . Arthritis   . CAD,  NATIVE VESSEL    coronary stent x1-tx. Plavix use.  Marland Kitchen COPD (chronic obstructive pulmonary disease) ([place])    due to 50+ years Smoking.  Marland Kitchen Dyspnea    wears oxygen PRN  . Gastrointestinal bleeding, upper 04/29/2016  . Gout, unspecified    foot  . HYPERLIPIDEMIA   . HYPERTENSION   . Lung mass   . NSCL ca dx'd 12/2015   Lung  . On supplemental oxygen by nasal cannula    PRN  . PEPTIC ULCER DISEASE   . Pneumonia   . TOBACCO ABUSE   . Type 2 diabetes mellitus ([place])    denies on 01-06-16    Past Surgical History:  Procedure Laterality Date  . BRONCHIAL BIOPSY  02/25/2020   Procedure: BRONCHIAL BIOPSIES;  Surgeon: Collene Gobble, MD;  Location: [facility] ENDOSCOPY;  Service: Pulmonary;;  . BRONCHIAL BRUSHINGS  02/25/2020   Procedure: BRONCHIAL BRUSHINGS;  Surgeon: Collene Gobble, MD;  Location: [facility] ENDOSCOPY;  Service: Pulmonary;;  . CARDIAC CATHETERIZATION     '06-stent placed, 6'13 - Dr. Lizbeth Bark follows  . COLONOSCOPY WITH PROPOFOL N/A 09/24/2019   Procedure: COLONOSCOPY WITH PROPOFOL;  Surgeon: Irene Shipper, MD;  Location: [facility] ENDOSCOPY;  Service: Endoscopy;  Laterality: N/A;  . CORONARY STENT PLACEMENT     s/p  . ENDOBRONCHIAL ULTRASOUND Bilateral 01/11/2016   Procedure: ENDOBRONCHIAL ULTRASOUND;  Surgeon: Collene Gobble, MD;  Location: [facility] ENDOSCOPY;  Service: Endoscopy;  Laterality: Bilateral;  . ESOPHAGOGASTRODUODENOSCOPY (EGD) WITH PROPOFOL N/A 09/24/2019   Procedure: ESOPHAGOGASTRODUODENOSCOPY (EGD) WITH  PROPOFOL;  Surgeon: Irene Shipper, MD;  Location: [facility] ENDOSCOPY;  Service: Endoscopy;  Laterality: N/A;  . IR IMAGING GUIDED PORT INSERTION  03/23/2020  . POLYPECTOMY  09/24/2019   Procedure: POLYPECTOMY;  Surgeon: Irene Shipper, MD;  Location: [facility] ENDOSCOPY;  Service: Endoscopy;;  . VIDEO BRONCHOSCOPY  02/25/2020   Procedure: VIDEO BRONCHOSCOPY WITHOUT FLUORO;  Surgeon: Collene Gobble, MD;  Location: [facility] ENDOSCOPY;  Service: Pulmonary;;  . VIDEO BRONCHOSCOPY WITH ENDOBRONCHIAL NAVIGATION  Right 03/07/2018   Procedure: VIDEO BRONCHOSCOPY WITH ENDOBRONCHIAL NAVIGATION;  Surgeon: Collene Gobble, MD;  Location: [place];  Service: Thoracic;  Laterality: Right;   Social history  reports that he quit smoking about 2 years ago. His smoking use included cigarettes. He has a 25.00 pack-year smoking history. He has never used smokeless tobacco. He reports previous alcohol use. He reports that he does not use drugs.  No Known Allergies  Family History  Problem Relation Age of Onset  . Breast cancer Sister   . Cancer Brother        Uncertain type  . Arthritis Mother   . Hypertension Father   . Heart attack Father   . Colon cancer Neg Hx   . Liver cancer Neg Hx   . Stomach cancer Neg Hx     Prior to Admission medications   Medication Sig Start Date End Date Taking? Authorizing Provider  acetaminophen (TYLENOL) 500 MG tablet Take 250 mg by mouth every 6 (six) hours as needed for moderate pain or headache.    Yes [provider]  allopurinol (ZYLOPRIM) 100 MG tablet TAKE 1 TABLET BY MOUTH EVERY DAY Patient taking differently: Take 100 mg by mouth daily.  01/07/19  Yes Renato Shin, MD  atorvastatin (LIPITOR) 80 MG tablet TAKE 1 TABLET BY MOUTH EVERY DAY Patient taking differently: Take 80 mg by mouth daily.  11/01/19  Yes Fay Records, MD  brimonidine (ALPHAGAN) 0.15 % ophthalmic solution Place 1 drop into both eyes in the morning and at bedtime.  01/15/20  Yes [provider]  docusate sodium (COLACE) 100 MG capsule Take 100 mg by mouth daily.    Yes [provider]  eplerenone (INSPRA) 25 MG tablet TAKE 1 TABLET BY MOUTH EVERY DAY Patient taking differently: Take 25 mg by mouth daily.  03/10/20  Yes Collene Gobble, MD  HYDROcodone-homatropine [facility]) 5-1.5 MG/5ML syrup Take 5 mLs by mouth every 6 (six) hours as needed for cough. 02/26/20  Yes Byrum, Rose Fillers, MD  KLOR-CON M20 20 MEQ tablet TAKE 1 TABLET BY MOUTH DAILY Patient taking differently: Take 20  mEq by mouth daily.  09/09/18  Yes Renato Shin, MD  lidocaine-prilocaine (EMLA) cream Apply to the Port-A-Cath site 30 minutes before chemotherapy. Patient taking differently: Apply 1 application topically as needed (port access). Apply to the Port-A-Cath site 30 minutes before chemotherapy. 03/03/20  Yes Curt Bears, MD  metoprolol succinate (TOPROL-XL) 25 MG 24 hr tablet Take 25 mg by mouth daily.  08/05/19  Yes [provider]  OXYGEN Inhale 2 L into the lungs continuous.    Yes [provider]  pantoprazole (PROTONIX) 40 MG tablet TAKE 40 MG BY MOUTH EVERY DAY Patient taking differently: Take 40 mg by mouth daily.  03/07/18  Yes Collene Gobble, MD  polyethylene glycol (MIRALAX / GLYCOLAX) 17 g packet Take 17 g by mouth daily as needed for mild constipation or moderate constipation.   Yes [provider]  PROAIR HFA 108 [phone]  Base) MCG/ACT inhaler Inhale 2 puffs into the lungs every 6 (six) hours as needed for wheezing or shortness of breath. 02/17/18  Yes [provider]  prochlorperazine (COMPAZINE) 10 MG tablet Take 1 tablet (10 mg total) by mouth every 6 (six) hours as needed for nausea or vomiting. 03/03/20  Yes Curt Bears, MD  sildenafil (REVATIO) 20 MG tablet Take 100 mg by mouth daily as needed for erectile dysfunction. 09/02/19  Yes [provider]  TRELEGY ELLIPTA 100-62.5-25 MCG/INH AEPB Inhale 1 puff into the lungs daily. 09/17/19  Yes Collene Gobble, MD  aspirin 81 MG tablet Take 1 tablet (81 mg total) by mouth at bedtime. Restart on 03/09/18 03/07/18   Collene Gobble, MD  clopidogrel (PLAVIX) 75 MG tablet Take 1 tablet (75 mg total) by mouth daily. Okay to restart this medication on 02/26/2020 02/25/20   Collene Gobble, MD  Na Sulfate-K Sulfate-Mg Sulf 17.5-3.13-1.6 GM/177ML SOLN Suprep-Use as directed Patient not taking: Reported on 04/01/2020 09/23/19   Irene Shipper, MD  tamsulosin (FLOMAX) 0.4 MG CAPS capsule Take 0.4 mg by mouth  daily. 03/27/20   [provider]    Physical Exam: Vitals:   04/01/20 0655 04/01/20 0700 04/01/20 0921 04/01/20 1000  BP: 123/66 124/79 110/61 (!) 112/59  Pulse: (!) 115 (!) 116 (!) 120 (!) 116  Resp: (!) 24 20 20 18   Temp:      TempSrc:      SpO2: 99% 98% 98% 100%    Constitutional: Chronically ill looking elderly gentleman lying in bed.  No acute distress.  Very poor historian.  Does not participate in conversation much. Vitals:   04/01/20 0655 04/01/20 0700 04/01/20 0921 04/01/20 1000  BP: 123/66 124/79 110/61 (!) 112/59  Pulse: (!) 115 (!) 116 (!) 120 (!) 116  Resp: (!) 24 20 20 18   Temp:      TempSrc:      SpO2: 99% 98% 98% 100%   Eyes: PERRL, lids and conjunctivae normal ENMT: Mucous membranes are moist. Posterior pharynx clear of any exudate or lesions.  Very poor dentition. Neck: normal, supple, no masses, no thyromegaly Respiratory: bilateral decreased breath sounds at bases, no wheezing; some scattered crackles.  Normal respiratory effort. No accessory muscle use.  Intermittently tachypneic Cardiovascular: S1 S2 positive, tachycardic.  Trace bilateral lower extremity edema. 2+ pedal pulses.  Abdomen: no tenderness, no masses palpated. No hepatosplenomegaly. Bowel sounds positive.  Musculoskeletal: no clubbing / cyanosis. No joint deformity upper and lower extremities.  Skin: no rashes, lesions, ulcers. No induration Neurologic: CN 2-12 grossly intact. Moving extremities. No focal neurologic deficits.  Very poor historian. Psychiatric: Could not be assessed because of patient being a poor historian.   Labs on Admission: I have personally reviewed following labs and imaging studies  CBC: Recent Labs  Lab 03/31/20 0900 04/01/20 0552  WBC 11.8* 22.2*  NEUTROABS 8.0* 20.4*  HGB 11.0* 11.7*  HCT 37.8* 40.5  MCV 102.4* 104.7*  PLT 347 850   Basic Metabolic Panel: Recent Labs  Lab 03/31/20 0900 04/01/20 0552  NA 140 140  K 5.4* 5.2*  CL 93* 92*   CO2 39* 38*  GLUCOSE 229* 136*  BUN 10 16  CREATININE 0.90 0.93  CALCIUM 9.2 8.7*   GFR: Estimated Creatinine Clearance: 85.4 mL/min (by C-G formula based on SCr of 0.93 mg/dL). Liver Function Tests: Recent Labs  Lab 03/31/20 0900  AST 23  ALT 20  ALKPHOS 111  BILITOT 0.3  PROT 7.7  ALBUMIN 2.8*   No results for input(s): LIPASE, AMYLASE in the last 168 hours. No results for input(s): AMMONIA in the last 168 hours. Coagulation Profile: No results for input(s): INR, PROTIME in the last 168 hours. Cardiac Enzymes: No results for input(s): CKTOTAL, CKMB, CKMBINDEX, TROPONINI in the last 168 hours. BNP (last 3 results) No results for input(s): PROBNP in the last 8760 hours. HbA1C: No results for input(s): HGBA1C in the last 72 hours. CBG: No results for input(s): GLUCAP in the last 168 hours. Lipid Profile: No results for input(s): CHOL, HDL, LDLCALC, TRIG, CHOLHDL, LDLDIRECT in the last 72 hours. Thyroid Function Tests: Recent Labs    03/31/20 0900  TSH 0.571   Anemia Panel: No results for input(s): VITAMINB12, FOLATE, FERRITIN, TIBC, IRON, RETICCTPCT in the last 72 hours. Urine analysis:    Component Value Date/Time   COLORURINE YELLOW 04/01/2020 0552   APPEARANCEUR CLEAR 04/01/2020 0552   LABSPEC 1.019 04/01/2020 0552   PHURINE 5.0 04/01/2020 0552   GLUCOSEU NEGATIVE 04/01/2020 0552   GLUCOSEU NEGATIVE 12/27/2017 0844   HGBUR NEGATIVE 04/01/2020 0552   BILIRUBINUR NEGATIVE 04/01/2020 0552   KETONESUR NEGATIVE 04/01/2020 0552   PROTEINUR NEGATIVE 04/01/2020 0552   UROBILINOGEN 0.2 12/27/2017 0844   NITRITE NEGATIVE 04/01/2020 0552   LEUKOCYTESUR NEGATIVE 04/01/2020 0552    Radiological Exams on Admission: DG Chest 2 View  Result Date: 04/01/2020 CLINICAL DATA:  Worsening shortness of breath. History of non-small cell lung cancer. EXAM: CHEST - 2 VIEW COMPARISON:  Chest x-ray 02/28/2020 and PET-CT 02/04/2020 FINDINGS: Stable right upper lobe lung mass and  surrounding extensive radiation changes with loss of volume in the right hemithorax. Slight increase in surrounding right lung density which could reflect pneumonitis or interstitial spread of tumor. The left lung remains relatively clear. Left IJ power port in good position without complicating features. No pleural effusions or pneumothorax. IMPRESSION: 1. Stable right upper lobe lung mass and surrounding radiation changes. 2. Slight increase in surrounding right lung density which could reflect pneumonitis or interstitial spread of tumor. Electronically Signed   By: Marijo Sanes M.D.   On: 04/01/2020 06:32   CT Angio Chest PE W and/or Wo Contrast  Result Date: 04/01/2020 CLINICAL DATA:  Hypoxemia. Shortness of breath. History of lung cancer. EXAM: CT ANGIOGRAPHY CHEST WITH CONTRAST TECHNIQUE: Multidetector CT imaging of the chest was performed using the standard protocol during bolus administration of intravenous contrast. Multiplanar CT image reconstructions and MIPs were obtained to evaluate the vascular anatomy. CONTRAST:  21mL OMNIPAQUE IOHEXOL 350 MG/ML SOLN COMPARISON:  CT chest 01/20/2020.  PET-CT 02/04/2020. FINDINGS: Cardiovascular: The heart size is normal. No substantial pericardial effusion. Coronary artery calcification is evident. Atherosclerotic calcification is noted in the wall of the thoracic aorta. Left Port-A-Cath tip is positioned in the upper right atrium. No filling defects within the opacified pulmonary arteries to suggest the presence of an acute pulmonary embolus. Mediastinum/Nodes: No mediastinal lymphadenopathy. 11 mm short axis left hilar lymph node appears progressive in the interval. Consolidative changes are noted in the parahilar right lung. There is no axillary lymphadenopathy. The esophagus has normal imaging features. Lungs/Pleura: Centrilobular and paraseptal emphysema evident. Right upper lobe pulmonary mass measures 3.4 x 2.4 cm today compared to 3.0 x 2.6 cm  previously. The collapse/consolidative changes in the right upper lobe are similar to minimally progressed in the interval. Since the prior study the patient has developed collapse/consolidative change in the right middle lobe as well. No substantial pleural effusion. Upper Abdomen:  Right subdiaphragmatic soft tissue lesion measures 5.6 cm today compared to 4.8 cm previously. Musculoskeletal: No worrisome lytic or sclerotic osseous abnormality. Cortical erosion with fracture of the anterior fourth rib is similar to prior. Midline subcutaneous cystic lesion in the anterior chest wall is stable, compatible with sebaceous cyst. Review of the MIP images confirms the above findings. IMPRESSION: 1. No CT evidence for acute pulmonary embolus. 2. Continued progression of the right upper lobe pulmonary mass with worsening right upper lobe collapse/consolidation and interval development of complete collapse/consolidation of the right middle lobe. No substantial pleural effusion at this time. 3. Slight progression of the right subdiaphragmatic soft tissue lesion posterior to hepatic dome. This lesion was not hypermetabolic on previous PET-CT. Continued attention on follow-up recommended. 4. Mild progression of left hilar lymphadenopathy. 5.  Emphysema (ICD10-J43.9) and Aortic Atherosclerosis (ICD10-170.0) Electronically Signed   By: Misty Stanley M.D.   On: 04/01/2020 09:38    EKG: Independently reviewed.  Sinus tachycardia with no ST elevations or depressions.  Assessment/Plan  Acute on chronic hypoxic respiratory failure COPD exacerbation Recurrent non-small cell lung cancer currently on chemotherapy causing right upper lobe and right middle lobe collapse/consolidation Probable postobstructive pneumonia -Patient normally wears 2 to 3 L oxygen by nasal cannula at home.  Currently requiring 4 to 5 L oxygen. -CT of the chest showing right upper lobe and right middle lobe collapse/consolidation with possibility of  postobstructive pneumonia -Patient was started on Solu-Medrol in the ED.  Will continue Solu-Medrol 60 mg IV every 8 hours.  Continue Trelegy.  Continue nebs.  Incentive spirometry/flutter valve -We will use broad-spectrum antibiotics cefepime/vancomycin.  Also will order blood cultures -Palliative Care consultation for goals of care discussion.  I have notified Dr. Mohamed/oncology about the patient: Patient requests evaluation by Dr. Julien Nordmann while inpatient.  I have also notified Dr. Lamonte Sakai regarding the patient who recommends antibiotics and steroids.  We will follow further pulmonary recommendations. -SLP evaluation  Leukocytosis -Probably from above.  Monitor  Chronic macrocytic anemia -Hemoglobin stable.  Will check vitamin E56, TSH and folic acid levels  Hypertension -Monitor blood pressure.  Continue metoprolol for now.  Eplerenone held for now.  Mild hyperkalemia -Repeat a.m. labs.  Hold supplemental potassium  History of coronary artery disease -No chest pain.  Continue Plavix and aspirin along with beta-blocker.  Outpatient follow-up with cardiology  Hyperlipidemia -Continue Lipitor  Gout -Continue allopurinol  Generalized deconditioning -Overall prognosis is guarded to poor.  Palliative care consultation.  PT evaluation   DVT prophylaxis: Lovenox Code Status: Full Family Communication: Spoke to patient at bedside.  He states that he does not have any medical power of attorney and makes his medical decisions himself.  Currently alert awake oriented Disposition Plan: Will need 2 to 3 days of hospitalization with IV steroids and antibiotics.  Possible discharge home afterwards pending clinical improvement Consults called: Notified oncology/Dr. Julien Nordmann and pulmonary/Dr. Lamonte Sakai.  Requested palliative care consultation Admission status: Inpatient/telemetry  Severity of Illness: The appropriate patient status for this patient is INPATIENT. Inpatient status is judged to be  reasonable and necessary in order to provide the required intensity of service to ensure the patient's safety. The patient's presenting symptoms, physical exam findings, and initial radiographic and laboratory data in the context of their chronic comorbidities is felt to place them at high risk for further clinical deterioration. Furthermore, it is not anticipated that the patient will be medically stable for discharge from the hospital within 2 midnights of admission. The following factors support  the patient status of inpatient.   " The patient's presenting symptoms include worsening shortness of breath. " The worrisome physical exam findings include tachycardia/intermittent tachypnea/scattered crackles. " The initial radiographic and laboratory data are worrisome because of right middle and upper lobe collapse/consolidation. " The chronic co-morbidities include lung cancer/chronic hypoxic respiratory failure on oxygen.   * I certify that at the point of admission it is my clinical judgment that the patient will require inpatient hospital care spanning beyond 2 midnights from the point of admission due to high intensity of service, high risk for further deterioration and high frequency of surveillance required.Aline August MD [facility]  04/01/2020, 10:42 AM

## 2020-04-01 NOTE — Evaluation (Signed)
SLP Cancellation Note  Patient Details Name: KESLER WICKHAM MRN: [id number] DOB: 05-Aug-1941   Cancelled treatment:       Reason Eval/Treat Not Completed: Medical issues which prohibited therapy(pt required transfer to ICU due to respiratory deficits with paradoxical breathing, transfer for Bipap, will continue efforts)   Macario Golds 04/01/2020, 5:26 PM  Kathleen Lime, MS [place] [facility] [phone]

## 2020-04-01 NOTE — Progress Notes (Signed)
20 g IV infiltrated into the rt.upper arm-Ultrasound guided IV   66 ml omni 350 extravasated, RN and Dr. Melina Copa was made aware, Ice was applied, Radiology PA paged to evaluate patients arm  Extravasation papers was given to RN

## 2020-04-01 NOTE — Consult Note (Signed)
Palliative care consult note  Reason for consult: Goals of care in light of worsening respiratory status with lung cancer  Palliative care consult received.  Chart reviewed including personal review of pertinent labs and imaging.  Briefly, Ryan Contreras is a 79 year old male with past medical history of recurrent non-small cell lung cancer status post chemo and radiation, chronic hypoxic respiratory failure on 2 to 3 L oxygen at home, CAD status post stenting, COPD, tobacco abuse, hypertension, hyperlipidemia and gout who presented to the emergency room with worsening shortness of breath.  He recently received second round of chemotherapy on 4/20.  Dr. Lamonte Sakai is his primary pulmonologist who he follows with for COPD and lung cancer and Dr. Earlie Server is his oncologist.  I attempted to see Ryan Contreras today on the floor.  At the time of my arrival, however, he was being transported to CT scan for CT due to worsening mental status.  He also has an ABG pending.  He was unresponsive and not able to participate in any conversation with me at this time.  I called and left a voicemail for his daughter, Ryan Contreras.  Of note, Ryan Contreras from [facility] noted that she spoke with Ryan Contreras earlier today and plan at that time was for continuation of full code.  I did not receive an answer when I attempted to call his other daughter, Ryan Contreras.  I then called and was able to reach patient's sister, Ryan Contreras.  Normally, Ryan Contreras makes his own medical decisions.  Ryan Contreras reports that she works in conjunction with patient's daughters to assist him in decision-making.  I talked with her about his clinical course this admission.  She reports that she had discussed earlier with patient's daughter, Ryan Contreras.  We discussed the fact that Ryan Contreras has had an acute change in his mental status with being taken downstairs for CT scan and also had an ABG pending.  Ryan Contreras reports that Ryan Contreras had never discussed with her  regarding any wishes for limitations of care and she is not aware that he is spoken with his daughters regarding this either.  We discussed his current condition and concern regarding potential for continued decline.  Ryan Contreras reports understanding concern and that she needs to speak further with patient's daughters regarding care plan moving forward.  For now, she believes that Ryan Contreras would want to pursue any and all aggressive interventions.  She also feels that Dr. Earlie Server needs to weigh in on his case prior to family making any decisions regarding any changes to long-term care plan or his goals of care.  - Full code/Full Scope  - Updated sister on his acute change with plan for CT scan and ABG.  Discussed that depending upon results, he may require higher level of care if continued aggressive interventions are desired.  She reports that she will talk further with patient's family (his 2 daughters) but at this time she believes that continued aggressive interventions and maintaining full code full scope treatment would be his wishes in the wishes of his family. -Await return call from patient's daughter  Start time: 1600 End time: 6073 Total time: 50 minutes  Greater than 50%  of this time was spent counseling and coordinating care related to the above assessment and plan.  Micheline Rough, MD [facility] [phone]

## 2020-04-02 DIAGNOSIS — Z7189 Other specified counseling: Secondary | ICD-10-CM | POA: Diagnosis not present

## 2020-04-02 DIAGNOSIS — J441 Chronic obstructive pulmonary disease with (acute) exacerbation: Secondary | ICD-10-CM | POA: Diagnosis not present

## 2020-04-02 LAB — COMPREHENSIVE METABOLIC PANEL
ALT: 21 U/L (ref 0–44)
AST: 25 U/L (ref 15–41)
Albumin: 2.6 g/dL — ABNORMAL LOW (ref 3.5–5.0)
Alkaline Phosphatase: 80 U/L (ref 38–126)
Anion gap: 9 (ref 5–15)
BUN: 28 mg/dL — ABNORMAL HIGH (ref 8–23)
CO2: 34 mmol/L — ABNORMAL HIGH (ref 22–32)
Calcium: 7.7 mg/dL — ABNORMAL LOW (ref 8.9–10.3)
Chloride: 94 mmol/L — ABNORMAL LOW (ref 98–111)
Creatinine, Ser: 0.94 mg/dL (ref 0.61–1.24)
GFR calc Af Amer: >60 mL/min (ref >60)
GFR calc non Af Amer: >60 mL/min (ref >60)
Glucose, Bld: 159 mg/dL — ABNORMAL HIGH (ref 70–99)
Potassium: 5.2 mmol/L — ABNORMAL HIGH (ref 3.5–5.1)
Sodium: 137 mmol/L (ref 135–145)
Total Bilirubin: 0.3 mg/dL (ref 0.3–1.2)
Total Protein: 6.6 g/dL (ref 6.5–8.1)

## 2020-04-02 LAB — CBC
HCT: 35 % — ABNORMAL LOW (ref 39.0–52.0)
Hemoglobin: 9.8 g/dL — ABNORMAL LOW (ref 13.0–17.0)
MCH: 29.7 pg (ref 26.0–34.0)
MCHC: 28 g/dL — ABNORMAL LOW (ref 30.0–36.0)
MCV: 106.1 fL — ABNORMAL HIGH (ref 80.0–100.0)
Platelets: 304 10*3/uL (ref 150–400)
RBC: 3.3 MIL/uL — ABNORMAL LOW (ref 4.22–5.81)
RDW: 15.7 % — ABNORMAL HIGH (ref 11.5–15.5)
WBC: 18.3 10*3/uL — ABNORMAL HIGH (ref 4.0–10.5)
nRBC: 0.1 % (ref 0.0–0.2)

## 2020-04-02 LAB — MRSA CULTURE: Culture: NO GROWTH

## 2020-04-02 LAB — PROCALCITONIN: Procalcitonin: <0.1 ng/mL

## 2020-04-02 MED ORDER — BOOST / RESOURCE BREEZE PO LIQD CUSTOM
1.0000 | ORAL | Status: DC
  Administered 2020-04-03 – 2020-04-09 (×6): 1 via ORAL

## 2020-04-02 MED ORDER — ENSURE ENLIVE PO LIQD
237.0000 mL | Freq: Two times a day (BID) | ORAL | Status: DC
  Administered 2020-04-02 – 2020-04-09 (×12): 237 mL via ORAL

## 2020-04-02 MED ORDER — ADULT MULTIVITAMIN W/MINERALS CH
1.0000 | ORAL_TABLET | Freq: Every day | ORAL | Status: DC
  Administered 2020-04-02 – 2020-04-09 (×8): 1 via ORAL
  Filled 2020-04-02 (×8): qty 1

## 2020-04-02 MED ORDER — LIP MEDEX EX OINT
TOPICAL_OINTMENT | CUTANEOUS | Status: DC | PRN
  Filled 2020-04-02: qty 7

## 2020-04-02 NOTE — Progress Notes (Signed)
  Evaluation after Contrast Extravasation  Patient seen and examined immediately after contrast extravasation while in ICU. Patient had a RUE contrast extravasation 04/01/2020 (approximately 60 cc into RUE).   Exam: There is no swelling at the RUE at site of IV.  There is mild erythema. There is no discoloration. There are no blisters. There are no signs of decreased perfusion of the skin.  It is warm to touch.  The patient has normal ROM in fingers.  Radial pulse 2+ bilaterally.  Per contrast extravasation protocol, patient to keep an ice pack on the area for 20-60 minutes at a time for about 48 hours- additional 24 hours of this. Keep arm elevated as much as possible.   Please call the radiology department if there is: - increase in pain or swelling - changed or altered sensation - ulceration or blistering - increasing redness - warmth or increasing firmness - decreased tissue perfusion as noted by decreased capillary refill or discoloration of skin - decreased pulses peripheral to site  Please call IR with questions/concerns.   Claris Pong Breda Bond PA-C 04/02/2020 12:08 PM

## 2020-04-02 NOTE — Evaluation (Signed)
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  Patient reports he had chemotherapy 03/31/20. At baseline he is on 2 L . Required BiPAP. now on 4 L 04/02/20   Clinical Impression   Patient with functional deficits listed below impacting safety and independence with self care. Patient requiring mod A for supine to sit and max A x2 for sit to supine. Patient very tremulous/shakey throughout session with LEs even becoming rigid during mobility back to bed. Currently recommending SNF based on patient requiring x2 assist for basic mobility, decreased cognition, and unsafe to stand/transfer at this time.    Follow Up Recommendations  SNF    Equipment Recommendations  Other (comment)(defer to next venue)       Precautions / Restrictions Precautions Precautions: Fall Restrictions Weight Bearing Restrictions: No      Mobility Bed Mobility Overal bed mobility: Needs Assistance Bed Mobility: Supine to Sit;Sit to Supine     Supine to sit: Mod assist;HOB elevated Sit to supine: Max assist;+2 for physical assistance;+2 for safety/equipment   General bed mobility comments: patient initiate mobilizing LEs with tactile cues requires mod A for trunk support, max A x2 to return to supine with LEs becoming rigid  Transfers                 General transfer comment: deferred for safety'    Balance Overall balance assessment: Needs assistance Sitting-balance support: Bilateral upper extremity supported;Feet supported Sitting balance-Leahy Scale: Poor Sitting balance - Comments: patient very tremulous requiring assist for safety to maintain balance       Standing balance comment:  deferred                           ADL either performed or assessed with clinical judgement   ADL Overall ADL's : Needs assistance/impaired Eating/Feeding: NPO   Grooming: Set up;Wash/dry face;Bed level Grooming Details (indicate cue type and reason): required multiple cues to initiate task Upper Body Bathing: Moderate assistance;Sitting;Bed level   Lower Body Bathing: Maximal assistance;Sitting/lateral leans;Bed level;Total assistance   Upper Body Dressing : Moderate assistance;Sitting;Bed level   Lower Body Dressing: Maximal assistance;Total assistance;Sitting/lateral leans;Bed level Lower Body Dressing Details (indicate cue type and reason): total A to don socks Toilet Transfer: +2 for physical assistance;+2 for safety/equipment Toilet Transfer Details (indicate cue type and reason): deferred due to decreased sitting balance, very tremulous seated EOB unable to keep grasp on walker Toileting- Clothing Manipulation and Hygiene: Total assistance       Functional mobility during ADLs: Moderate assistance;Maximal assistance;+2 for physical assistance;+2 for safety/equipment General ADL Comments: patient requires increased assistance with self care due to decreased safety, strength, activity tolerance, balance                  Pertinent Vitals/Pain Pain Assessment: No/denies pain     Hand Dominance Left   Extremity/Trunk Assessment Upper Extremity Assessment Upper Extremity Assessment: Generalized weakness   Lower Extremity Assessment Lower Extremity Assessment: Defer to PT evaluation       Communication Communication Communication: Receptive difficulties;Other (comment)(initial garbled speech, cleared more with alertness)   Cognition Arousal/Alertness: Lethargic Behavior During Therapy: WFL for tasks assessed/performed Overall Cognitive Status: No family/caregiver present to  determine baseline cognitive functioning Area of Impairment:  Attention;Following commands;Safety/judgement;Problem solving                   Current Attention Level: Sustained   Following Commands: Follows one step commands inconsistently;Follows one step commands with increased time Safety/Judgement: Decreased awareness of safety   Problem Solving: Requires verbal cues;Requires tactile cues General Comments: patient oriented to place, time, knows hes weak however difficulty keeping eyes open with fluctuating levels of assist/alertness              Home Living Family/patient expects to be discharged to:: Private residence Living Arrangements: Children Available Help at Discharge: Family Type of Home: House Home Access: Level entry     Home Layout: One level     Bathroom Shower/Tub: International aid/development worker Accessibility: Yes How Accessible: Accessible via walker Home Equipment: Grab bars - tub/shower;Walker - 2 wheels   Additional Comments: patient reports his daughter is handicap. Info obtained from prior eval as patient is questionable historian      Prior Functioning/Environment Level of Independence: Independent with assistive device(s)        Comments: patient reports his sister was taking him to chemo appointments, but does still drive         OT Problem List: Decreased strength;Decreased activity tolerance;Impaired balance (sitting and/or standing);Decreased cognition;Decreased safety awareness      OT Treatment/Interventions: Self-care/ADL training;Therapeutic exercise;Energy conservation;DME and/or AE instruction;Therapeutic activities;Patient/family education;Balance training    OT Goals(Current goals can be found in the care plan section) Acute Rehab OT Goals Patient Stated Goal: "I didn't come in like this" OT Goal Formulation: With patient Time For Goal Achievement: 04/16/20 Potential to Achieve Goals: Good  OT Frequency: Min 2X/week           Co-evaluation PT/OT/SLP Co-Evaluation/Treatment:  Yes Reason for Co-Treatment: Complexity of the patient's impairments (multi-system involvement);To address functional/ADL transfers;For patient/therapist safety;Necessary to address cognition/behavior during functional activity   OT goals addressed during session: ADL's and self-care      AM-PAC OT "6 Clicks" Daily Activity     Outcome Measure Help from another person eating meals?: Total(NPO) Help from another person taking care of personal grooming?: A Little Help from another person toileting, which includes using toliet, bedpan, or urinal?: Total Help from another person bathing (including washing, rinsing, drying)?: A Lot Help from another person to put on and taking off regular upper body clothing?: A Lot Help from another person to put on and taking off regular lower body clothing?: Total 6 Click Score: 10   End of Session Equipment Utilized During Treatment: Oxygen Nurse Communication: Mobility status  Activity Tolerance: Patient limited by fatigue;Treatment limited secondary to medical complications (Comment)(tremulousness) Patient left: in bed;with call bell/phone within reach  OT Visit Diagnosis: Other abnormalities of gait and mobility (R26.89);Muscle weakness (generalized) (M62.81);Other symptoms and signs involving cognitive function                Time: 6286-3817 OT Time Calculation (min): 29 min Charges:  OT General Charges $OT Visit: 1 Visit OT Evaluation $OT Eval Moderate Complexity: [place] OT Pager: [place] 04/02/2020, 1:12 PM

## 2020-04-02 NOTE — Plan of Care (Signed)
Patient is off BIPAP on 3L [place]. Therapy is at bedside working with patient. Patient was alert and calling out at 0730 requesting to have mask removed- removed BIPAP per critical care team and placed on [place] 4L. Provided patient with water- no coughing or signs of aspiration. Mouth care completed. Will continue to monitor.  Problem: Education: Goal: Knowledge of General Education information will improve Description: Including pain rating scale, medication(s)/side effects and non-pharmacologic comfort measures Outcome: Progressing   Problem: Health Behavior/Discharge Planning: Goal: Ability to manage health-related needs will improve Outcome: Progressing   Problem: Clinical Measurements: Goal: Ability to maintain clinical measurements within normal limits will improve Outcome: Progressing Goal: Will remain free from infection Outcome: Progressing Goal: Diagnostic test results will improve Outcome: Progressing Goal: Respiratory complications will improve Outcome: Progressing Goal: Cardiovascular complication will be avoided Outcome: Progressing   Problem: Activity: Goal: Risk for activity intolerance will decrease Outcome: Progressing   Problem: Nutrition: Goal: Adequate nutrition will be maintained Outcome: Progressing   Problem: Coping: Goal: Level of anxiety will decrease Outcome: Progressing   Problem: Elimination: Goal: Will not experience complications related to bowel motility Outcome: Progressing Goal: Will not experience complications related to urinary retention Outcome: Progressing   Problem: Pain Managment: Goal: General experience of comfort will improve Outcome: Progressing   Problem: Safety: Goal: Ability to remain free from injury will improve Outcome: Progressing   Problem: Skin Integrity: Goal: Risk for impaired skin integrity will decrease Outcome: Progressing

## 2020-04-02 NOTE — Progress Notes (Signed)
Pt is awake, alert, no increased wob/respiratory distress noted.  HR 108, rr19, spo2 100% on 3lnc.  Bipap in room on standby but not indicated at this time.

## 2020-04-02 NOTE — Progress Notes (Signed)
Initial Nutrition Assessment  RD working remotely.   DOCUMENTATION CODES:   Not applicable  INTERVENTION:  - will order Boost Breeze once/day, each supplement provides 250 kcal and 9 grams of protein. - will order Ensure Enlive BID, each supplement provides 350 kcal and 20 grams of protein. - will order daily multivitamin with minerals.    NUTRITION DIAGNOSIS:   Increased nutrient needs related to acute illness, chronic illness, cancer and cancer related treatments as evidenced by estimated needs.  GOAL:   Patient will meet greater than or equal to 90% of their needs  MONITOR:   PO intake, Supplement acceptance, Labs, Weight trends  REASON FOR ASSESSMENT:   Consult Assessment of nutrition requirement/status  ASSESSMENT:   79 y.o. 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 He presented with worsening SOB. He was noted to be a poor historian in the ED. He denied N/V/D, abdominal pain, or dysuria. He reported decreased PO intake PTA.  Diet changed from Heart Healthy, thin liquids to Dysphagia 3, thin liquids with Heart Healthy diet restriction today at 1045 following evaluation by SLP. No intakes documented since admission. Notes indicate patient reported decreased appetite and intakes PTA.   Per chart review, weight yesterday was 225 lb and weight on 4/8 was 227 lb. This indicates 2 lb weight loss (1% body weight) in the past 2 weeks; not significant for time frame.   Palliative Care was consulted but note from yesterday evening indicates that patient and daughter were unavailable for conversation. Patient remains Full Code at this time.   Patient has not been seen by a [place] RD at any time in the past.   Per notes: - acute on chronic  hypoxic respiratory failure - recurrent NSCLC - post-obstructive PNA   Labs reviewed; K: 5.2 mmol/l, Cl: 94 mmol/l, BUN: 28 mg/dl, Ca: 7.7 mg/dl. Medications reviewed; 100 mg colace/day, 60 mg solu-medrol TID, 40 mg oral protonix/day.     NUTRITION - FOCUSED PHYSICAL EXAM:  unable to complete at this time  Diet Order:   Diet Order            DIET DYS 3 Room service appropriate? Yes with Assist; Fluid consistency: Thin  Diet effective now              EDUCATION NEEDS:   No education needs have been identified at this time  Skin:  Skin Assessment: Reviewed RN Assessment  Last BM:  PTA/unknown  Height:   Ht Readings from Last 1 Encounters:  04/01/20 6\' 1"  (1.854 m)    Weight:   Wt Readings from Last 1 Encounters:  04/01/20 102 kg    Ideal Body Weight:  83.6 kg  BMI:  Body mass index is 29.67 kg/m.  Estimated Nutritional Needs:   Kcal:  2353-6144 kcal  Protein:  110-125 grams  Fluid:  >/= 2.2 L/day     Jarome Matin, MS, RD, LDN, CNSC Inpatient Clinical Dietitian RD pager # available in [facility]  After hours/weekend pager # available in [facility]

## 2020-04-02 NOTE — Progress Notes (Signed)
PT had been removed from BiPAP prior to RT arrival to PT room. Leaving PT off BiPAP- does not appear to be in respiratory distress at this time.

## 2020-04-02 NOTE — Progress Notes (Signed)
NAME:  Ryan Contreras, MRN:  [id number], DOB:  08-Oct-1941, LOS: 1 ADMISSION DATE:  04/01/2020, CONSULTATION DATE:  04/01/2020 REFERRING MD:  Dr. Starla Link, CHIEF COMPLAINT: Chronic hypoxic respiratory failure  Brief History   79yo male presented with worsening shortness of breath int he setting of wosening non-small cell lung cancer and questionable post obstructive pneumonia.   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  Her primary oncologist recommended patient recently received second round of chemotherapy 4/20.  Patient reports shortness of breath has been progressive in nature and worsens with exertion.  Also reports decreased oral intake. denies any fever, chills, worsening cough, chest pain, nausea, vomiting, or diarrhea.    Patient is followed by Dr. Lamonte Sakai as primary pulmonologist for treatment of COPD Gold C, lung cancer, and allergic rhinitis.  Most recently seen in the pulmonary clinic 4/6.    Vital signs on admission significant for mild tachycardia, all other vital signs within normal limits.  Lab work significant for mild hyperkalemia, slightly elevated BNP, and leukocytosis.  CT chest on admission negative for acute PE but revealed progressive worsening of pulmonary mass with worsening right upper and right middle lobe collapse.  PCCM consulted for additional assistance in management  Past Medical History  Type 2 diabetes Tobacco abuse Lung cancer Hyperlipidemia Hypertension COPD CAD status post stent [place] [facility] Events   4/21  Consults:   PCCM  Procedures:    Significant Diagnostic Tests:  CTA chest 4/21> 1. No CT evidence for acute pulmonary embolus. 2. Continued progression of the right upper lobe pulmonary mass with worsening right upper lobe collapse/consolidation and interval development of complete collapse/consolidation of the right middle lobe. No substantial pleural effusion at this time. 3. Slight progression of the right subdiaphragmatic soft tissue lesion posterior to hepatic dome. This lesion was not hypermetabolic on previous PET-CT. Continued attention on follow-up recommended. 4. Mild progression of left hilar lymphadenopathy. 5.  Emphysema (ICD10-J43.9) and Aortic Atherosclerosis (ICD10-170.0   Micro Data:  Covid 4/21 > negative MRSA PCR 4/21 > Blood cultures 4/21 >  Antimicrobials:  Cefepime 4/21 > Vancomycin 4/21 >  Interim history/subjective:  Seen this AM lying in bed alert and oriented x3, denies any acute complaints   Objective   Blood pressure 133/73, pulse (!) 106, temperature (!) 97.1 F (36.2 C), temperature source Axillary, resp. rate 16, height 6\' 1"  (1.854 m), weight 102 kg, SpO2 97 %.    FiO2 (%):  [40 %-50 %] 40 %   Intake/Output Summary (Last 24 hours) at 04/02/2020 0924 Last data filed at 04/02/2020 8768 Gross per 24 hour  Intake 996.99 ml  Output --  Net 996.99 ml   Filed Weights   04/01/20 1638  Weight: 102 kg    Examination: General: Chronically ill appearing elderly male lying in bed on [place] after transition from [place], in NAD HEENT: [place]/AT, MM pink/dry, PERRL,  Neuro: Alert and oriented x3, able to follow all commands, non-focal  CV: s1s2 regular rate and rhythm, no murmur, rubs, or gallops,  PULM: Diminished and course with right worse than left  GI: soft, bowel sounds active in all  4 quadrants, non-tender, non-distended, tolerating TF Extremities: warm/dry, no edema  Skin: no rashes or lesions  Resolved Hospital Problem list     Assessment & Plan:   Acute on chronic hypoxic respiratory failure Recurrent non-small cell lung cancer Postobstructive pneumonia COPD Gold C -CTA negative for PE but reveals worsning collapse of right upper lobe and new development of right middle lobe collapse  P: Continue supplemental oxygen  Continue IV Solu-Medrol  Continue broad spectrum empiric antibiotic  Follow cultures  SLP eval pending  Follow intermittent chest x-ray and ABG Aggressive pulmonary hygiene  Mobilize as able  Agree with palliative care involvement    PCCM will sign off. Thank you for the opportunity to participate in this patient's care. Please contact if we can be of further assistance.  Best practice:  Diet: Heart healthy  Pain/Anxiety/Delirium protocol (if indicated): PRNs VAP protocol (if indicated): N/A DVT prophylaxis: Lovenox GI prophylaxis: PPI Glucose control: SSI Mobility: Bedrest  Code Status: Full Family Communication: Updated patients daughter Neill Loft over the phone 4/21, verified with daughter full code status for now until family meeting can be held  Disposition: Floor   Labs   CBC: Recent Labs  Lab 03/31/20 0900 04/01/20 0552 04/02/20 0304  WBC 11.8* 22.2* 18.3*  NEUTROABS 8.0* 20.4*  --   HGB 11.0* 11.7* 9.8*  HCT 37.8* 40.5 35.0*  MCV 102.4* 104.7* 106.1*  PLT 347 373 759    Basic Metabolic Panel: Recent Labs  Lab 03/31/20 0900 04/01/20 0552 04/02/20 0304  NA 140 140 137  K 5.4* 5.2* 5.2*  CL 93* 92* 94*  CO2 39* 38* 34*  GLUCOSE 229* 136* 159*  BUN 10 16 28*  CREATININE 0.90 0.93 0.94  CALCIUM 9.2 8.7* 7.7*   GFR: Estimated Creatinine Clearance: 83.9 mL/min (by C-G formula based on SCr of 0.94 mg/dL). Recent Labs  Lab 03/31/20 0900 04/01/20 0552 04/02/20 0304  PROCALCITON  --   --  <0.10  WBC 11.8* 22.2* 18.3*  LATICACIDVEN  --  0.9  --     Liver Function Tests: Recent Labs  Lab 03/31/20 0900 04/02/20 0304  AST 23 25  ALT 20 21  ALKPHOS 111 80  BILITOT  0.3 0.3  PROT 7.7 6.6  ALBUMIN 2.8* 2.6*   No results for input(s): LIPASE, AMYLASE in the last 168 hours. No results for input(s): AMMONIA in the last 168 hours.  ABG    Component Value Date/Time   PHART 7.150 (LL) 04/01/2020 1549   PCO2ART 116 (HH) 04/01/2020 1549   PO2ART 94.8 04/01/2020 1549   HCO3 38.7 (H) 04/01/2020 1549   O2SAT 95.7 04/01/2020 1549     Coagulation Profile: No results for input(s): INR, PROTIME in the last 168 hours.  Cardiac Enzymes: No results for input(s): CKTOTAL, CKMB, CKMBINDEX, TROPONINI in the last 168 hours.  HbA1C: Hemoglobin A1C  Date/Time Value Ref Range Status  06/25/2019 09:46 AM 6.4 (A) 4.0 - 5.6 % Final  10/30/2018 10:00 AM 6.0 (A) 4.0 - 5.6 % Final   Hgb A1c MFr Bld  Date/Time Value Ref Range Status  08/25/2016 11:10 AM 6.7 (H) 4.6 - 6.5 % Final    Comment:    Glycemic Control Guidelines for People with Diabetes:Non Diabetic:  <6%Goal of Therapy: <7%Additional Action Suggested:  >8%   04/13/2015 08:44 AM 6.4 4.6 - 6.5 % Final    Comment:    Glycemic Control Guidelines for People with Diabetes:Non Diabetic:  <6%Goal of Therapy: <7%Additional Action Suggested:  >8%  CBG: Recent Labs  Lab 04/01/20 1535  GLUCAP 113*    Review of Systems:   Unable to obtain due to confusion   Past Medical History  He,  has a past medical history of Arthritis, CAD, NATIVE VESSEL, COPD (chronic obstructive pulmonary disease) ([place]), Dyspnea, Gastrointestinal bleeding, upper (04/29/2016), Gout, unspecified, HYPERLIPIDEMIA, HYPERTENSION, Lung mass, NSCL ca (dx'd 12/2015), On supplemental oxygen by nasal cannula, PEPTIC ULCER DISEASE, Pneumonia, TOBACCO ABUSE, and Type 2 diabetes mellitus ([place]).   Surgical History    Past Surgical History:  Procedure Laterality Date  . BRONCHIAL BIOPSY  02/25/2020   Procedure: BRONCHIAL BIOPSIES;  Surgeon: Collene Gobble, MD;  Location: [facility] ENDOSCOPY;  Service: Pulmonary;;  . BRONCHIAL BRUSHINGS  02/25/2020    Procedure: BRONCHIAL BRUSHINGS;  Surgeon: Collene Gobble, MD;  Location: [facility] ENDOSCOPY;  Service: Pulmonary;;  . CARDIAC CATHETERIZATION     '06-stent placed, 6'13 - Dr. Lizbeth Bark follows  . COLONOSCOPY WITH PROPOFOL N/A 09/24/2019   Procedure: COLONOSCOPY WITH PROPOFOL;  Surgeon: Irene Shipper, MD;  Location: [facility] ENDOSCOPY;  Service: Endoscopy;  Laterality: N/A;  . CORONARY STENT PLACEMENT     s/p  . ENDOBRONCHIAL ULTRASOUND Bilateral 01/11/2016   Procedure: ENDOBRONCHIAL ULTRASOUND;  Surgeon: Collene Gobble, MD;  Location: [facility] ENDOSCOPY;  Service: Endoscopy;  Laterality: Bilateral;  . ESOPHAGOGASTRODUODENOSCOPY (EGD) WITH PROPOFOL N/A 09/24/2019   Procedure: ESOPHAGOGASTRODUODENOSCOPY (EGD) WITH PROPOFOL;  Surgeon: Irene Shipper, MD;  Location: [facility] ENDOSCOPY;  Service: Endoscopy;  Laterality: N/A;  . IR IMAGING GUIDED PORT INSERTION  03/23/2020  . POLYPECTOMY  09/24/2019   Procedure: POLYPECTOMY;  Surgeon: Irene Shipper, MD;  Location: [facility] ENDOSCOPY;  Service: Endoscopy;;  . VIDEO BRONCHOSCOPY  02/25/2020   Procedure: VIDEO BRONCHOSCOPY WITHOUT FLUORO;  Surgeon: Collene Gobble, MD;  Location: [facility] ENDOSCOPY;  Service: Pulmonary;;  . VIDEO BRONCHOSCOPY WITH ENDOBRONCHIAL NAVIGATION Right 03/07/2018   Procedure: VIDEO BRONCHOSCOPY WITH ENDOBRONCHIAL NAVIGATION;  Surgeon: Collene Gobble, MD;  Location: [place];  Service: Thoracic;  Laterality: Right;     Social History   reports that he quit smoking about 2 years ago. His smoking use included cigarettes. He has a 25.00 pack-year smoking history. He has never used smokeless tobacco. He reports previous alcohol use. He reports that he does not use drugs.   Family History   His family history includes Arthritis in his mother; Breast cancer in his sister; Cancer in his brother; Heart attack in his father; Hypertension in his father. There is no history of Colon cancer, Liver cancer, or Stomach cancer.   Allergies No Known Allergies   Home Medications   Prior to Admission medications   Medication Sig Start Date End Date Taking? Authorizing Provider  acetaminophen (TYLENOL) 500 MG tablet Take 250 mg by mouth every 6 (six) hours as needed for moderate pain or headache.    Yes [provider]  allopurinol (ZYLOPRIM) 100 MG tablet TAKE 1 TABLET BY MOUTH EVERY DAY Patient taking differently: Take 100 mg by mouth daily.  01/07/19  Yes Renato Shin, MD  atorvastatin (LIPITOR) 80 MG tablet TAKE 1 TABLET BY MOUTH EVERY DAY Patient taking differently: Take 80 mg by mouth daily.  11/01/19  Yes Fay Records, MD  brimonidine (ALPHAGAN) 0.15 % ophthalmic solution Place 1 drop into both eyes in the morning and at bedtime.  01/15/20  Yes [provider]  docusate sodium (COLACE) 100 MG capsule Take 100 mg by mouth daily.    Yes [provider]  eplerenone (INSPRA) 25 MG tablet TAKE 1 TABLET BY MOUTH EVERY DAY Patient taking differently: Take 25 mg by mouth daily.  03/10/20  Yes Collene Gobble, MD  HYDROcodone-homatropine [facility]) 5-1.5 MG/5ML syrup Take 5 mLs by mouth every 6 (six) hours as needed for cough. 02/26/20  Yes Byrum, Rose Fillers, MD  KLOR-CON M20 20 MEQ tablet TAKE 1 TABLET BY MOUTH DAILY Patient taking differently: Take 20 mEq by mouth daily.  09/09/18  Yes Renato Shin, MD  lidocaine-prilocaine (EMLA) cream Apply to the Port-A-Cath site 30 minutes before chemotherapy. Patient taking differently: Apply 1 application topically as needed (port access). Apply to the Port-A-Cath site 30 minutes before chemotherapy. 03/03/20  Yes Curt Bears, MD  metoprolol succinate (TOPROL-XL) 25 MG 24 hr tablet Take 25 mg by mouth daily.  08/05/19  Yes [provider]  OXYGEN Inhale 2 L into the lungs continuous.    Yes [provider]  pantoprazole (PROTONIX) 40 MG tablet TAKE 40 MG BY MOUTH EVERY DAY Patient taking differently: Take 40 mg by mouth daily.  03/07/18  Yes Collene Gobble, MD  polyethylene glycol (MIRALAX /  GLYCOLAX) 17 g packet Take 17 g by mouth daily as needed for mild constipation or moderate constipation.   Yes [provider]  PROAIR HFA 108 (90 Base) MCG/ACT inhaler Inhale 2 puffs into the lungs every 6 (six) hours as needed for wheezing or shortness of breath. 02/17/18  Yes [provider]  prochlorperazine (COMPAZINE) 10 MG tablet Take 1 tablet (10 mg total) by mouthevery 6 (six) hours as needed for nausea or vomiting. 03/03/20  Yes Curt Bears, MD  sildenafil (REVATIO) 20 MG tablet Take 100 mg by mouth daily as needed for erectile dysfunction. 09/02/19  Yes [provider]  TRELEGY ELLIPTA 100-62.5-25 MCG/INH AEPB Inhale 1 puff into the lungs daily. 09/17/19  Yes Collene Gobble, MD  aspirin 81 MG tablet Take 1 tablet (81 mg total) by mouth at bedtime. Restart on 03/09/18 03/07/18   Collene Gobble, MD  clopidogrel (PLAVIX) 75 MG tablet Take 1 tablet (75 mg total) by mouth daily. Okay to restart this medication on 02/26/2020 02/25/20   Collene Gobble, MD  Na Sulfate-K Sulfate-Mg Sulf 17.5-3.13-1.6 GM/177ML SOLN Suprep-Use as directed Patient not taking: Reported on 04/01/2020 09/23/19   Irene Shipper, MD  tamsulosin (FLOMAX) 0.4 MG CAPS capsule Take 0.4 mg by mouth daily. 03/27/20   [provider]     Signature:  Johnsie Cancel, NP-C [facility] Contact / Pager information can be found on Amion  04/02/2020, 9:24 AM

## 2020-04-02 NOTE — Evaluation (Signed)
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  Patient reports he had chemotherapy 03/31/20. At baseline he is on 2 L . Required BiPAP. now on 4 L 04/02/20  Clinical Impression  The patient was sleeping, aroused and able to communicate. Patient with noted jerking of extremities and trunk and extraneous mouth movements. Patient was able to initiate and assist to sit up in bed edge. Patient noted with increased  And stronger jerking of extremities, uncontrolled. 2 total assist to return to supine. Patient stated" I didn't come in like this".RN notified.  Patient's SPO2 on 3 L remained > 92%. HR in 110's. Patient reports that he lives with a daughter who is disabled. Reports his sister was transporting him to his treatments. Will need to clarify home situation for Dc planning. Pt admitted with above diagnosis.  Pt currently with functional limitations due to the deficits listed below (see PT Problem List). Pt will benefit from skilled PT to increase their independence and safety with mobility to allow discharge to the venue listed below.       Follow Up Recommendations SNF;Supervision/Assistance - 24 hour    Equipment Recommendations  None recommended by PT    Recommendations for Other Services       Precautions / Restrictions Precautions Precautions: Fall Precaution Comments: having jerking movements of extremiies and trunk Restrictions Weight Bearing Restrictions: No      Mobility  Bed Mobility Overal bed mobility: Needs Assistance Bed Mobility: Supine to Sit;Sit to Supine     Supine to sit: Mod assist;HOB elevated Sit to supine: Max assist;+2 for physical assistance;+2 for  safety/equipment   General bed mobility comments: patient initiate mobilizing LEs with tactile cues requires mod A for trunk support, max A x2 to return to supine with LEs demonstrating increased jerking of arms and legs and trunk and  became more rigid in extension  Transfers Overall transfer level: Needs assistance               General transfer comment: deferred for safety' due to increased jerking, could not grip RW, hands kept falling off  Ambulation/Gait                Stairs            Wheelchair Mobility    Modified Rankin (Stroke Patients Only)       Balance Overall balance assessment: Needs assistance Sitting-balance support: Bilateral upper extremity supported;Feet supported Sitting balance-Leahy Scale: Poor Sitting balance - Comments: patient very tremulous and strong jerking of body, still alert. requiring assist for safety to maintain balance       Standing balance comment: deferred                             Pertinent Vitals/Pain Pain Assessment: No/denies pain    Home Living Family/patient expects to be discharged to:: Private residence Living Arrangements: Children Available Help at Discharge: Family Type of Home: House Home Access: Level entry     Home Layout: One level Home Equipment: Grab bars - tub/shower;Walker - 2 wheels Additional Comments: patient reports his daughter is handicap. Info obtained from prior eval as patient is questionable historian    Prior  Function Level of Independence: Independent with assistive device(s)         Comments: patient reports his sister was taking him to chemo appointments, but does still drive      Hand Dominance   Dominant Hand: Left    Extremity/Trunk Assessment   Upper Extremity Assessment Upper Extremity Assessment: Generalized weakness    Lower Extremity Assessment Lower Extremity Assessment: Defer to PT evaluation       Communication   Communication:  Receptive difficulties;Other (comment)(initial garbled speech, cleared more with alertness)  Cognition Arousal/Alertness: Lethargic Behavior During Therapy: WFL for tasks assessed/performed Overall Cognitive Status: No family/caregiver present to determine baseline cognitive functioning Area of Impairment: Attention;Following commands;Safety/judgement;Problem solving                   Current Attention Level: Sustained   Following Commands: Follows one step commands inconsistently;Follows one step commands with increased time Safety/Judgement: Decreased awareness of safety   Problem Solving: Requires verbal cues;Requires tactile cues General Comments: patient oriented to place, time, knows hes weak however difficulty keeping eyes open with fluctuating levels of assist/alertness      General Comments      Exercises     Assessment/Plan    PT Assessment Patient needs continued PT services  PT Problem List Decreased balance;Decreased strength;Decreased knowledge of precautions;Decreased mobility;Decreased knowledge of use of DME;Decreased activity tolerance;Decreased safety awareness;Cardiopulmonary status limiting activity       PT Treatment Interventions DME instruction;Functional mobility training;Patient/family education;Gait training;Therapeutic activities;Therapeutic exercise;Cognitive remediation    PT Goals (Current goals can be found in the Care Plan section)  Acute Rehab PT Goals Patient Stated Goal: "I didn't come in like this" PT Goal Formulation: Patient unable to participate in goal setting Time For Goal Achievement: 04/16/20 Potential to Achieve Goals: Fair    Frequency Min 2X/week   Barriers to discharge Decreased caregiver support      Co-evaluation PT/OT/SLP Co-Evaluation/Treatment: Yes Reason for Co-Treatment: For patient/therapist safety PT goals addressed during session: Mobility/safety with mobility OT goals addressed during session: ADL's and  self-care       AM-PAC PT "6 Clicks" Mobility  Outcome Measure Help needed turning from your back to your side while in a flat bed without using bedrails?: A Lot Help needed moving from lying on your back to sitting on the side of a flat bed without using bedrails?: A Lot Help needed moving to and from a bed to a chair (including a wheelchair)?: Total Help needed standing up from a chair using your arms (e.g., wheelchair or bedside chair)?: Total Help needed to walk in hospital room?: Total Help needed climbing 3-5 steps with a railing? : Total 6 Click Score: 8    End of Session   Activity Tolerance: Treatment limited secondary to medical complications (Comment) Patient left: in bed;with call bell/phone within reach;with bed alarm set Nurse Communication: Mobility status PT Visit Diagnosis: Unsteadiness on feet (R26.81);Difficulty in walking, not elsewhere classified (R26.2)    Time: 8016-5537 PT Time Calculation (min) (ACUTE ONLY): 32 min   Charges:   PT Evaluation $PT Eval Moderate Complexity: [place] Pager [phone] Office [phone]    Claretha Cooper 04/02/2020, 2:58 PM

## 2020-04-02 NOTE — Progress Notes (Addendum)
HEMATOLOGY-ONCOLOGY PROGRESS NOTE  SUBJECTIVE: Per nursing, the patient is more alert today.  He was able to tell me that he is at the hospital and that the year is 2021.  He is really unable to verbalize anything else further to me.  His respiratory status seems stable.  Oncology History Overview Note  Patient presented with abnormal LDCT screening exam 11/26/15  Non-small cell carcinoma of lung, stage 2 (HCC)   Staging form: Lung, AJCC 7th Edition     Clinical stage from 01/21/2016: Stage IIA (T1b, N1, M0) - Signed by Curt Bears, MD on 01/23/2016     Malignant neoplasm of right upper lobe of lung ([facility])  12/11/2015 Imaging   PET IMPRESSION: 2.7 cm spiculated hypermetabolic nodule in central right upper lobe, consistent with primary bronchogenic carcinoma.  Mild hypermetabolic right hilar lymphadenopathy, consistent with metastatic disease.  11 mm sub-solid left lower   01/11/2016 Pathology Results   BRONCHIAL BRUSHING(EBUS) RUL (SPECIMEN 4 OF 4 COLLECTED 01/11/16): NON SMALL CELL CARCINOMA. FINE NEEDLE ASPIRATION, ENDOSCOPIC (EBUS), RIGHT PERI-HILAR NODULE(SPECIMEN 1 OF 4 COLLECTED 01/11/16): NON-SMALL CELL CARCINOMA.    01/11/2016 Surgery   Operation: Flexible video fiberoptic bronchoscopy with endobronchial ultrasound and biopsies.    01/21/2016 Initial Diagnosis   Non-small cell carcinoma of lung, stage 2 ([facility])   01/26/2016 -  Radiation Therapy   SIM   02/01/2016 -  Chemotherapy   1st chemotherapy taxol/carbo   03/09/2020 -  Chemotherapy   The patient had palonosetron (ALOXI) injection 0.25 mg, 0.25 mg, Intravenous,  Once, 2 of 4 cycles Administration: 0.25 mg (03/09/2020), 0.25 mg (03/31/2020) pegfilgrastim-jmdb (FULPHILA) injection 6 mg, 6 mg, Subcutaneous,  Once, 2 of 4 cycles Administration: 6 mg (03/11/2020) CARBOplatin (PARAPLATIN) 580 mg in sodium chloride 0.9 % 250 mL chemo infusion, 580 mg (100 % of original dose 584 mg), Intravenous,  Once, 2 of 4 cycles Dose modification:  584 mg (original dose 584 mg, Cycle 1), 584 mg (original dose 584 mg, Cycle 3) Administration: 580 mg (03/09/2020), 580 mg (03/31/2020) fosaprepitant (EMEND) 150 mg in sodium chloride 0.9 % 145 mL IVPB, 150 mg, Intravenous,  Once, 2 of 4 cycles Administration: 150 mg (03/09/2020), 150 mg (03/31/2020) PACLitaxel (TAXOL) 402 mg in sodium chloride 0.9 % 500 mL chemo infusion (> 44m/m2), 175 mg/m2 = 402 mg (100 % of original dose 175 mg/m2), Intravenous,  Once, 2 of 4 cycles Dose modification: 175 mg/m2 (original dose 175 mg/m2, Cycle 1, Reason: Provider Judgment) Administration: 402 mg (03/09/2020), 402 mg (03/31/2020) pembrolizumab (KEYTRUDA) 200 mg in sodium chloride 0.9 % 50 mL chemo infusion, 200 mg, Intravenous, Once, 2 of 6 cycles Administration: 200 mg (03/09/2020), 200 mg (03/31/2020)  for chemotherapy treatment.    Stage IV squamous cell carcinoma of right lung ([facility]  03/03/2020 Initial Diagnosis   Stage IV squamous cell carcinoma of right lung ([facility]   03/09/2020 -  Chemotherapy   The patient had palonosetron (ALOXI) injection 0.25 mg, 0.25 mg, Intravenous,  Once, 2 of 4 cycles Administration: 0.25 mg (03/09/2020), 0.25 mg (03/31/2020) pegfilgrastim-jmdb (FULPHILA) injection 6 mg, 6 mg, Subcutaneous,  Once, 2 of 4 cycles Administration: 6 mg (03/11/2020) CARBOplatin (PARAPLATIN) 580 mg in sodium chloride 0.9 % 250 mL chemo infusion, 580 mg (100 % of original dose 584 mg), Intravenous,  Once, 2 of 4 cycles Dose modification: 584 mg (original dose 584 mg, Cycle 1), 584 mg (original dose 584 mg, Cycle 3) Administration: 580 mg (03/09/2020), 580 mg (03/31/2020) fosaprepitant (EMEND) 150 mg in sodium chloride  0.9 % 145 mL IVPB, 150 mg, Intravenous,  Once, 2 of 4 cycles Administration: 150 mg (03/09/2020), 150 mg (03/31/2020) PACLitaxel (TAXOL) 402 mg in sodium chloride 0.9 % 500 mL chemo infusion (> 25m/m2), 175 mg/m2 = 402 mg (100 % of original dose 175 mg/m2), Intravenous,  Once, 2 of 4 cycles Dose  modification: 175 mg/m2 (original dose 175 mg/m2, Cycle 1, Reason: Provider Judgment) Administration: 402 mg (03/09/2020), 402 mg (03/31/2020) pembrolizumab (KEYTRUDA) 200 mg in sodium chloride 0.9 % 50 mL chemo infusion, 200 mg, Intravenous, Once, 2 of 6 cycles Administration: 200 mg (03/09/2020), 200 mg (03/31/2020)  for chemotherapy treatment.       REVIEW OF SYSTEMS:   Unable to obtain comprehensive review of systems secondary to patient condition  I have reviewed the past medical history, past surgical history, social history and family history with the patient and they are unchanged from previous note.   PHYSICAL EXAMINATION: ECOG PERFORMANCE STATUS: 2 - Symptomatic, <50% confined to bed  Vitals:   04/02/20 1205 04/02/20 1300  BP: (!) 158/96 (!) 117/43  Pulse: (!) 102 (!) 107  Resp: (!) 22 20  Temp:    SpO2: 100% 100%   Filed Weights   04/01/20 1638  Weight: 102 kg    Intake/Output from previous day: 04/21 0701 - 04/22 0700 In: 1697 [IV Piggyback:1697] Out: -   GENERAL: Chronically ill-appearing male, no distress LUNGS: Diminished breath sounds and coarse with right worse than left HEART: regular rate & rhythm and no murmurs and no lower extremity edema ABDOMEN:abdomen soft, non-tender and normal bowel sounds Musculoskeletal:no cyanosis of digits and no clubbing  NEURO: alert & oriented x 3 with fluent speech, no focal motor/sensory deficits  LABORATORY DATA:  I have reviewed the data as listed CMP Latest Ref Rng & Units 04/02/2020 04/01/2020 03/31/2020  Glucose 70 - 99 mg/dL 159(H) 136(H) 229(H)  BUN 8 - 23 mg/dL 28(H) 16 10  Creatinine 0.61 - 1.24 mg/dL 0.94 0.93 0.90  Sodium 135 - 145 mmol/L 137 140 140  Potassium 3.5 - 5.1 mmol/L 5.2(H) 5.2(H) 5.4(H)  Chloride 98 - 111 mmol/L 94(L) 92(L) 93(L)  CO2 22 - 32 mmol/L 34(H) 38(H) 39(H)  Calcium 8.9 - 10.3 mg/dL 7.7(L) 8.7(L) 9.2  Total Protein 6.5 - 8.1 g/dL 6.6 - 7.7  Total Bilirubin 0.3 - 1.2 mg/dL 0.3 - 0.3   Alkaline Phos 38 - 126 U/L 80 - 111  AST 15 - 41 U/L 25 - 23  ALT 0 - 44 U/L 21 - 20    Lab Results  Component Value Date   WBC 18.3 (H) 04/02/2020   HGB 9.8 (L) 04/02/2020   HCT 35.0 (L) 04/02/2020   MCV 106.1 (H) 04/02/2020   PLT 304 04/02/2020   NEUTROABS 20.4 (H) 04/01/2020    DG Chest 2 View  Result Date: 04/01/2020 CLINICAL DATA:  Worsening shortness of breath. History of non-small cell lung cancer. EXAM: CHEST - 2 VIEW COMPARISON:  Chest x-ray 02/28/2020 and PET-CT 02/04/2020 FINDINGS: Stable right upper lobe lung mass and surrounding extensive radiation changes with loss of volume in the right hemithorax. Slight increase in surrounding right lung density which could reflect pneumonitis or interstitial spread of tumor. The left lung remains relatively clear. Left IJ power port in good position without complicating features. No pleural effusions or pneumothorax. IMPRESSION: 1. Stable right upper lobe lung mass and surrounding radiation changes. 2. Slight increase in surrounding right lung density which could reflect pneumonitis or interstitial spread of  tumor. Electronically Signed   By: Marijo Sanes M.D.   On: 04/01/2020 06:32   CT HEAD WO CONTRAST  Result Date: 04/01/2020 CLINICAL DATA:  Altered mental status. Lethargy. EXAM: CT HEAD WITHOUT CONTRAST TECHNIQUE: Contiguous axial images were obtained from the base of the skull through the vertex without intravenous contrast. COMPARISON:  Head MRI 02/04/2016 FINDINGS: Brain: The study is mildly to moderately motion degraded despite an attempt at repeat imaging. Within this limitation, no acute large territory infarct, intracranial hemorrhage, mass, midline shift, or extra-axial fluid collection is identified. The ventricles and sulci are normal. Hypodensities in the cerebral white matter bilaterally are nonspecific but compatible with mild chronic small vessel ischemic disease. Vascular: Calcified atherosclerosis at the skull base.  Skull: No fracture or suspicious osseous lesion. Sinuses/Orbits: Paranasal sinuses and mastoid air cells are clear. Bilateral cataract extraction. Other: None. IMPRESSION: 1. Motion degraded examination without evidence of acute intracranial abnormality. 2. Mild chronic small vessel ischemic disease. Electronically Signed   By: Logan Bores M.D.   On: 04/01/2020 16:41   CT Angio Chest PE W and/or Wo Contrast  Result Date: 04/01/2020 CLINICAL DATA:  Hypoxemia. Shortness of breath. History of lung cancer. EXAM: CT ANGIOGRAPHY CHEST WITH CONTRAST TECHNIQUE: Multidetector CT imaging of the chest was performed using the standard protocol during bolus administration of intravenous contrast. Multiplanar CT image reconstructions and MIPs were obtained to evaluate the vascular anatomy. CONTRAST:  222m OMNIPAQUE IOHEXOL 350 MG/ML SOLN COMPARISON:  CT chest 01/20/2020.  PET-CT 02/04/2020. FINDINGS: Cardiovascular: The heart size is normal. No substantial pericardial effusion. Coronary artery calcification is evident. Atherosclerotic calcification is noted in the wall of the thoracic aorta. Left Port-A-Cath tip is positioned in the upper right atrium. No filling defects within the opacified pulmonary arteries to suggest the presence of an acute pulmonary embolus. Mediastinum/Nodes: No mediastinal lymphadenopathy. 11 mm short axis left hilar lymph node appears progressive in the interval. Consolidative changes are noted in the parahilar right lung. There is no axillary lymphadenopathy. The esophagus has normal imaging features. Lungs/Pleura: Centrilobular and paraseptal emphysema evident. Right upper lobe pulmonary mass measures 3.4 x 2.4 cm today compared to 3.0 x 2.6 cm previously. The collapse/consolidative changes in the right upper lobe are similar to minimally progressed in the interval. Since the prior study the patient has developed collapse/consolidative change in the right middle lobe as well. No substantial  pleural effusion. Upper Abdomen: Right subdiaphragmatic soft tissue lesion measures 5.6 cm today compared to 4.8 cm previously. Musculoskeletal: No worrisome lytic or sclerotic osseous abnormality. Cortical erosion with fracture of the anterior fourth rib is similar to prior. Midline subcutaneous cystic lesion in the anterior chest wall is stable, compatible with sebaceous cyst. Review of the MIP images confirms the above findings. IMPRESSION: 1. No CT evidence for acute pulmonary embolus. 2. Continued progression of the right upper lobe pulmonary mass with worsening right upper lobe collapse/consolidation and interval development of complete collapse/consolidation of the right middle lobe. No substantial pleural effusion at this time. 3. Slight progression of the right subdiaphragmatic soft tissue lesion posterior to hepatic dome. This lesion was not hypermetabolic on previous PET-CT. Continued attention on follow-up recommended. 4. Mild progression of left hilar lymphadenopathy. 5.  Emphysema (ICD10-J43.9) and Aortic Atherosclerosis (ICD10-170.0) Electronically Signed   By: EMisty StanleyM.D.   On: 04/01/2020 09:38   IR IMAGING GUIDED PORT INSERTION  Result Date: 03/23/2020 INDICATION: History of recurrent lung cancer. In need durable intravenous access for chemotherapy administration. EXAM: IMPLANTED PORT  A CATH PLACEMENT WITH ULTRASOUND AND FLUOROSCOPIC GUIDANCE COMPARISON:  PET-CT-02/05/2020; chest CT-01/20/2020 MEDICATIONS: Ancef 2 gm IV; The antibiotic was administered within an appropriate time interval prior to skin puncture. ANESTHESIA/SEDATION: Moderate (conscious) sedation was employed during this procedure. A total of Versed 1 mg and Fentanyl 50 mcg was administered intravenously. Moderate Sedation Time: 40 minutes. The patient's level of consciousness and vital signs were monitored continuously by radiology nursing throughout the procedure under my direct supervision. CONTRAST:  None FLUOROSCOPY  TIME:  36 seconds (19 mGy) COMPLICATIONS: None immediate. Note, patient was found have asymptomatic tachycardic prior to procedure. 12 lead EKG demonstrated sinus tachycardia and again, the patient was asymptomatic, specifically, no chest pain shortness of breath, with normal hemodynamic stability and as such the decision was made to proceed with definitive Port a catheter placement. PROCEDURE: The procedure, risks, benefits, and alternatives were explained to the patient. Questions regarding the procedure were encouraged and answered. The patient understands and consents to the procedure. Given presence right-sided lung cancer recurrence, decision was made to proceed with left internal jugular approach port a catheter placement. The left neck and chest were prepped with chlorhexidine in a sterile fashion, and a sterile drape was applied covering the operative field. Maximum barrier sterile technique with sterile gowns and gloves were used for the procedure. A timeout was performed prior to the initiation of the procedure. Local anesthesia was provided with 1% lidocaine with epinephrine. After creating a small venotomy incision, a micropuncture kit was utilized to access the internal jugular vein. Real-time ultrasound guidance was utilized for vascular access including the acquisition of a permanent ultrasound image documenting patency of the accessed vessel. The microwire was utilized to measure appropriate catheter length. A subcutaneous port pocket was then created along the upper chest wall utilizing a combination of sharp and blunt dissection. The pocket was irrigated with sterile saline. A single lumen "ISP" sized power injectable port was chosen for placement. The 8 Fr catheter was tunneled from the port pocket site to the venotomy incision. The port was placed in the pocket. The external catheter was trimmed to appropriate length. At the venotomy, an 8 Fr peel-away sheath was placed over a guidewire under  fluoroscopic guidance. The catheter was then placed through the sheath and the sheath was removed. Final catheter positioning was confirmed and documented with a fluoroscopic spot radiograph. The port was accessed with a Huber needle, aspirated and flushed with heparinized saline. The venotomy site was closed with an interrupted 4-0 Vicryl suture. The port pocket incision was closed with interrupted 2-0 Vicryl suture. The skin was opposed with a running subcuticular 4-0 Vicryl suture. Dermabond and Steri-strips were applied to both incisions. Dressings were applied. The patient tolerated the procedure well without immediate post procedural complication. FINDINGS: After catheter placement, the tip lies within the superior cavoatrial junction. The catheter aspirates and flushes normally and is ready for immediate use. IMPRESSION: Successful placement of a left internal jugular approach power injectable Port-A-Cath. The catheter is ready for immediate use. Electronically Signed   By: Sandi Mariscal M.D.   On: 03/23/2020 16:09    ASSESSMENT AND PLAN: This is a pleasant 79 year old African-American male recurrent non-small cell lung cancer, squamous cell carcinoma presented initially as unresectable stage IIa non-small cell lung cancer, squamous cell carcinoma the right upper lobe lung nodule and hilar lymphadenopathy.  He was initially diagnosed in January 2017.  He is status post a course of concurrent chemoradiation.  He was on observation since April 2017.  More recently, he was found to have an enlarging soft tissue mass in the right upper lobe suspicious for disease recurrence.  He underwent repeat bronchoscopy with biopsy of the suspicious recurrent lung cancer under the care of Dr. Lamonte Sakai and the final pathology was consistent with a squamous cell carcinoma.  He is receiving systemic chemotherapy with carboplatin for an AUC of 5, paclitaxel 175 mg meter squared, and Keytruda 200 mg IV every 3 weeks.  He is status  post 2 cycles of his treatment.  Last treatment was given on 03/31/2020.  He was scheduled to receive Neulasta on 04/03/2020 but due to his hospitalization, this will not be given.  The patient seems to be a little more alert today and respiratory status is stable.  Recommend continuation of IV antibiotics and Solu-Medrol.  If the patient's status continues to deteriorate, we are in agreement with pursuing palliative care/hospice for this patient.  We will continue to follow along with you and watch his progress.    LOS: 1 day   [name], DNP, AGPCNP-BC, AOCNP 04/02/20  ADDENDUM: Hematology/Oncology Attending: I had a face-to-face encounter with the patient.  I recommended his care plan and I agree with the above note.  This is a very pleasant 79 years old African-American male with recurrent non-small cell lung cancer, squamous cell carcinoma involving the right upper lobe in addition to hilar lymphadenopathy that was initially diagnosed in 2017 he received a course of concurrent chemoradiation at that time and has been in observation until the recent disease progression that was biopsy-proven.  The patient started systemic chemotherapy with carboplatin, paclitaxel and Keytruda status post 2 cycles.  He has tolerated the first 2 cycles reasonably well but the patient was admitted with significant shortness of breath and repeat CT angiogram of the chest showed no evidence for pulmonary embolism but there was continued progression of the right upper lobe pulmonary mass with worsening right upper lobe collapse/consolidation and interval development of complete collapse and consolidation of the right middle lobe with slight progression of the right subdiaphragmatic soft tissue lesion posterior to the hepatic dome. I had a lengthy discussion with the patient and I also called his daughter [name] and his sister Denyse Amass about his condition. I strongly recommend for the patient palliative care on hospice  at this point.  I do not think additional chemotherapy would be helpful in his situation.  He also received a lot of palliative radiotherapy to this area in the past and I am not sure if palliative radiation would resolve his condition. The patient was a little bit sleepy during the visit but he is in agreement with the palliative care option and that was also discussed with his daughter and sister. Thank you for taking good care of Mr. Schliep.  I will follow up the patient with you and assist in his management on as-needed basis.  Disclaimer: This note was dictated with voice recognition software. Similar sounding words can inadvertently be transcribed and may be missed upon review. Eilleen Kempf, MD

## 2020-04-02 NOTE — Evaluation (Signed)
Clinical/Bedside Swallow Evaluation Patient Details  Name: Ryan Contreras MRN: [id number] Date of Birth: Apr 14, 1941  Today's Date: 04/02/2020 Time: SLP Start Time (ACUTE ONLY): 1031 SLP Stop Time (ACUTE ONLY): 1046 SLP Time Calculation (min) (ACUTE ONLY): 15 min  Past Medical History:  Past Medical History:  Diagnosis Date  . Arthritis   . CAD, NATIVE VESSEL    coronary stent x1-tx. Plavix use.  Marland Kitchen COPD (chronic obstructive pulmonary disease) ([place])    due to 50+ years Smoking.  Marland Kitchen Dyspnea    wears oxygen PRN  . Gastrointestinal bleeding, upper 04/29/2016  . Gout, unspecified    foot  . HYPERLIPIDEMIA   . HYPERTENSION   . Lung mass   . NSCL ca dx'd 12/2015   Lung  . On supplemental oxygen by nasal cannula    PRN  . PEPTIC ULCER DISEASE   . Pneumonia   . TOBACCO ABUSE   . Type 2 diabetes mellitus ([place])    denies on 01-06-16   Past Surgical History:  Past Surgical History:  Procedure Laterality Date  . BRONCHIAL BIOPSY  02/25/2020   Procedure: BRONCHIAL BIOPSIES;  Surgeon: Collene Gobble, MD;  Location: [facility] ENDOSCOPY;  Service: Pulmonary;;  . BRONCHIAL BRUSHINGS  02/25/2020   Procedure: BRONCHIAL BRUSHINGS;  Surgeon: Collene Gobble, MD;  Location: [facility] ENDOSCOPY;  Service: Pulmonary;;  . CARDIAC CATHETERIZATION     '06-stent placed, 6'13 - Dr. Lizbeth Bark follows  . COLONOSCOPY WITH PROPOFOL N/A 09/24/2019   Procedure: COLONOSCOPY WITH PROPOFOL;  Surgeon: Irene Shipper, MD;  Location: [facility] ENDOSCOPY;  Service: Endoscopy;  Laterality: N/A;  . CORONARY STENT PLACEMENT     s/p  . ENDOBRONCHIAL ULTRASOUND Bilateral 01/11/2016   Procedure: ENDOBRONCHIAL ULTRASOUND;  Surgeon: Collene Gobble, MD;  Location: [facility] ENDOSCOPY;  Service: Endoscopy;  Laterality: Bilateral;  . ESOPHAGOGASTRODUODENOSCOPY (EGD) WITH PROPOFOL N/A 09/24/2019   Procedure: ESOPHAGOGASTRODUODENOSCOPY (EGD) WITH PROPOFOL;  Surgeon: Irene Shipper, MD;  Location: [facility] ENDOSCOPY;  Service: Endoscopy;  Laterality: N/A;  . IR  IMAGING GUIDED PORT INSERTION  03/23/2020  . POLYPECTOMY  09/24/2019   Procedure: POLYPECTOMY;  Surgeon: Irene Shipper, MD;  Location: [facility] ENDOSCOPY;  Service: Endoscopy;;  . VIDEO BRONCHOSCOPY  02/25/2020   Procedure: VIDEO BRONCHOSCOPY WITHOUT FLUORO;  Surgeon: Collene Gobble, MD;  Location[facility] ENDOSCOPY;  Service: Pulmonary;;  . VIDEO BRONCHOSCOPY WITH ENDOBRONCHIAL NAVIGATION Right 03/07/2018   Procedure: VIDEO BRONCHOSCOPY WITH ENDOBRONCHIAL NAVIGATION;  Surgeon: Collene Gobble, MD;  Location: [place];  Service: Thoracic;  Laterality: Right;   HPI:  Ryan Contreras is a 79 y.o. 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   Assessment / Plan / Recommendation Clinical Impression  Pt was seen for a bedside swallow evaluation and he presents with minimal oral dysphagia secondary to dentition.  Pt consumed trials of thin liquid, puree, and regular solids.  Oral mechanism exam was unremarkable.  Mastication of regular solids was mildly prolonged secondary to missing dentition, but it was functional and only trace oral residue was observed on the pt's lingual surface after swallow initiation.  Pt cleared residue with a liquid wash.  Pt demonstrated good bolus acceptance, suspected timely AP transport/swallow initiation, and  consistent hyolaryngeal elevation/excursion to observation and palpation with all trials.  No overt s/sx of aspiration were observed with any PO trials, but eructation was noted following thin liquid trials.  Spoke with RN who stated that pt was currently taking Protonix to manage reflux symptoms.  Recommend Dysphagia 3 (soft) solids  and thin liquids with medication administered whole in puree and strict adherence to the following compensatory strategies: 1) Small bites/sips 2) Slow rate of intake 3) Sit upright as possible 4) Remain upright for 30+ min after PO intake.  Pt will benefit from full supervision during meals to assist with self-feeding and to cue for compensatory strategies.  SLP will briefly f/u to monitor diet tolerance.    SLP Visit Diagnosis: Dysphagia, oral phase (R13.11);Dysphagia, unspecified (R13.10)    Aspiration Risk  Mild aspiration risk    Diet Recommendation Dysphagia 3 (Mech soft);Thin liquid   Liquid Administration via: Cup;Straw Medication Administration: Whole meds with puree Supervision: Staff to assist with self feeding;Full supervision/cueing for compensatory strategies Compensations: Slow rate;Small sips/bites Postural Changes: Seated upright at 90 degrees;Remain upright for at least 30 minutes after po intake    Other  Recommendations Oral Care Recommendations: Oral care QID;Staff/trained caregiver to provide oral care   Follow up Recommendations None      Frequency and Duration min 2x/week  2 weeks       Prognosis Prognosis for Safe Diet Advancement: Good      Swallow Study   General Date of Onset: 04/01/20 HPI: Ryan Contreras is a 79 y.o. 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 Type of Study: Bedside Swallow Evaluation Previous Swallow Assessment: None  Diet Prior to this Study: Regular;Thin liquids Temperature Spikes Noted: No Respiratory Status: Nasal cannula History of Recent Intubation: No Behavior/Cognition:  Alert;Cooperative;Pleasant mood;Lethargic/Drowsy Oral Cavity Assessment: Dry Oral Care Completed by SLP: Recent completion by staff Oral Cavity - Dentition: Poor condition;Missing dentition Self-Feeding Abilities: Needs assist Patient Positioning: Upright in bed Baseline Vocal Quality: Low vocal intensity Volitional Swallow: Able to elicit    Oral/Motor/Sensory Function Overall Oral Motor/Sensory Function: Within functional limits   Ice Chips Ice chips: Within functional limits Presentation: Spoon   Thin Liquid Thin Liquid: Within functional limits Presentation: Spoon;Straw    Nectar Thick Nectar Thick Liquid: Not tested   Honey Thick Honey Thick Liquid: Not tested   Puree Puree: Within functional limits Presentation: Spoon   Solid     Solid: Within functional limits Presentation: Vicie Mutters., M.S., [place] Office: [phone]  Kemp 04/02/2020,10:58 AM

## 2020-04-02 NOTE — Progress Notes (Signed)
Palliative care progress note  Reason for consult: Goals of care in light of progression of lung cancer  I met today with [name].  He is much more awake, alert, and interactive today.  We discussed his clinical course over the past couple of weeks with recent chemotherapy followed by worsening shortness of breath and hospitalization requiring transfer to ICU with BiPAP overnight.  Discussed progression of his lung cancer with postobstructive pneumonia resulting in respiratory failure.  We talked about options for care moving forward including continuation of aggressive interventions versus consideration for limitations on care based upon the fact that he has progressive cancer and may not receive hope for benefit from continued escalation of care in the event he would progress to a point of cardiac or respiratory arrest.  At this point in time, Ryan Contreras states that he has not really considered any limitations on his care and would like to continue to consider this in conjunction with further discussion with his oncologist.  At this time, per my conversation with him, his desires to remain full code/full scope treatment.  I also discussed with him the fact that he was confused last night and unable to make his own medical decisions.  We talked about surrogate decision making and how his daughters would be his surrogate decision makers in the event that he is not able to make his own medical decisions.  He tells me that he certainly wants to make his own medical decisions if he is able, but in the event he is not able to make his own decisions his daughters as well as his 3 sisters, particularly Ryan Contreras, would be who we would want to act on his behalf.  Discussed the [place], it would be just his daughters who would act in this capacity and less he wanted to complete paperwork naming other surrogate decision making arrangements.  He states he will consider this as well.  - Full code/full  scope treatment - Discussed case with Ryan Contreras from oncology today.  Plan to continue to follow his clinical course with treatment for pneumonia and continue conversation regarding long-term goals of care based upon his clinical course for the duration of this admission.  Ryan Contreras is hopeful to be able to continue to pursue disease modifying therapy for his lung cancer. -I have asked Ryan Contreras to consider whom he would want to act as his surrogate in the event that he becomes confused again and cannot make his own medical decisions.  He understands that at this point in time legally his daughter would serve in this capacity.  He will consider if he would like to complete any other sort of healthcare power of attorney paperwork.  If so, I let him know this is something we could help facilitate here through our spiritual care services.  Time in: 1515 Time out: 1600 Total time: 45 minutes  Greater than 50%  of this time was spent counseling and coordinating care related to the above assessment and plan.  Micheline Rough, MD [place] Team [phone]

## 2020-04-02 NOTE — Progress Notes (Signed)
PROGRESS NOTE    Ryan Contreras  WCH:[id number] DOB: 1941-06-04 DOA: 04/01/2020 PCP: Cyndi Bender, PA-C   Brief Narrative: HPI per Dr. Theda Belfast on 04/01/20 Ryan Contreras is a 79 y.o. 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  Patient is a poor historian and does not provide accurate history.  I have reviewed patient's medical records and communicated with Dr. Julien Nordmann and Dr. Kyung Rudd as well.  Patient had his second cycle of current chemotherapy on 03/31/2020 and presented today to the ED with worsening shortness of breath.  At baseline, he wears 2 to 3 L of oxygen at home.  Patient complains of progressively worsening shortness of breath even with minimal exertion.  He denies fevers or worsening cough.  No chest pain.  Denies nausea, vomiting, diarrhea, abdominal pain, dysuria.  He does report decreased p.o. intake.  No known Covid exposures.  ED Course: He required 4 to 5 L oxygen via nasal cannula in the ED; his oxygen saturation was found to be 81% by EMS.  Chest x-ray showed possible worsening of mass.  CT of the chest was negative for PE but showed worsening of his cancer along with worsening of right upper lobe collapse and new right middle lobe collapse.  ED provider updated Dr. Julien Nordmann.  Hospitalist service was called to evaluate the patient.  **Interim History And up on BiPAP yesterday and was weaned off of it this morning.  SLP evaluated and recommending dysphagia 3 diet.  PT OT recommending SNF.  Pulmonary and medical oncology evaluated recommending IV antibiotics and steroids.  Palliative care medicine was consulted for goals of  care discussion and will continue full scope of treatment currently.  Assessment & Plan:   Principal Problem:   Acute on chronic respiratory failure (HCC) Active Problems:   Hyperlipidemia   Gout   Essential hypertension   CAD, NATIVE VESSEL   COPD with acute exacerbation (HCC)   Stage IV squamous cell carcinoma of right lung (HCC)  Acute on Chronic hypoxic respiratory failure quiring noninvasive positive pressure ventilation with BiPAP COPD exacerbation Recurrent non-small cell lung cancer currently on chemotherapy causing right upper lobe and right middle lobe collapse/consolidation Probable postobstructive pneumonia -Patient normally wears 2 to 3 L oxygen by nasal cannula at home.  Currently requiring 4 to 5 L oxygen and then had to be placed on BiPAP and was weaned off of BiPAP this morning -Done yesterday and showed a pH of 7.150, PCO2 of 116, PO2 of 94.8, bicarbonate level 38.7, and ABG O2 saturation 95.7% -CT of the chest showing right upper lobe and right middle lobe collapse/consolidation with possibility of postobstructive pneumonia -Patient was started on Solu-Medrol in the ED.  Will continue Solu-Medrol 60 mg IV every 8 hours.   -Continue Trelegy.  Continue nebs.  Incentive spirometry/flutter valve -We will use broad-spectrum antibiotics cefepime/vancomycin.  Also will order blood cultures -Palliative Care consultation for goals of care discussion.  I have notified Dr. Mohamed/oncology about the patient: Patient requests evaluation by Dr. Julien Nordmann while inpatient.   -Pulmonary was consulted regarding the patient who recommends continuing broad-spectrum antibiotics and steroids .   -Recommend continuing supplemental oxygen via nasal cannula, aggressive pulmonary hygiene and following  intermittent chest x-ray and ABG -Pulmonary is in agreement with palliative care involvement and have signed off the case -SLP evaluation recommending a dysphagia 3 diet -Adequate oncology  recommends continuing IV antibiotics and Solu-Medrol and if the patient's status continues deteriorate there agreement of pursuing palliative care and hospice for this patient and I will continue to follow along -Because he had extravasation of contrast on the right upper extremity and he is undergoing the extravasation protocol -Continue to monitor respiratory status carefully and repeat chest x-ray in the a.m. -dietitian consulted and recommending boost breeze once a day and Ensure Enlive p.o. twice daily as well as a daily multivitamin -PT OT recommending skilled nursing facility -Palliative care was consulted for further goals of care treatment and they discussed with medical oncology today and the plan is to continue to follow his clinical course with treatment of his pneumonia and continue full scope of treatment as well as continue conversation regarding long-term goals of care based on his clinical course for the duration of his admission -The patient is hopeful to pursue disease modifying therapy for his lung cancer  Leukocytosis -Probably from above is trending down now.  -WBC went from 22.2 and is now 18.3 -Continue to monitor and trend and repeat CBC in a.m.  Chronic Macrocytic Anemia -Patient's hemoglobin/hematocrit went from 11.7/40.5 and is now at 9.8/35.0 -MCV was 106.1  -Check anemia panel in the a.m for the B12 and folate level -Continue monitor for signs and symptoms of bleeding; currently no overt bleeding on  -repeat CBC in a.m.  Hypertension -Continue to Monitor BP carefully.   -Continue metoprolol for now.  -Eplerenone held for now. -Last BP was 143/66  Hyperkalemia -Mild at 5.2 -Continue to Hold Potassium Supplementation -Continue to Monitor and Trend -Currently holding Eplerenone  -IF remains elevated or worsens will start on Lokelma -Repeat CMP in AM   History of Coronary Artery Disease -No chest pain.   -Continue ASA 81 mg po Daily, Clopidogrel 75  mg po Daily, Atrovastatin 80 mg po qHS as well as beta-blocker with Metoprolol Succinate 25 mg po Daily.  -Outpatient follow-up with cardiology  Hyperlipidemia -Continue Atorvastatin 80 mg po qHS  Gout -Continue Allopurinol 100 mg po Daily   Generalized deconditioning -Overall prognosis is guarded to poor.   -Palliative care consultation.   -PT evaluation done and they are recommending SNF so we will need to consult transition of care to assist with placement  DVT prophylaxis: Enoxaparin 40 mg sq q24h Code Status: FULL CODE  Family Communication: No family present at bedside  Disposition Plan: Patient is from home now requiring SNF level of care and will need to have improvement of his respiratory status prior to safe discharge  Status is: Inpatient  Remains inpatient appropriate because:Inpatient level of care appropriate due to severity of illness   Dispo: The patient is from: Home              Anticipated d/c is to: SNF              Anticipated d/c date is: 2 days              Patient currently is not medically stable to d/c.  Consultants:   Medical Oncology  Pulmonary Critical Care  Interventional Radiology    Procedures: None   Antimicrobials:  Anti-infectives (From admission, onward)   Start     Dose/Rate Route Frequency Ordered Stop   04/02/20 1800  vancomycin (VANCOREADY) IVPB 1750 mg/350 mL  Status:  Discontinued     1,750 mg 175 mL/hr over 120 Minutes Intravenous Every 24 hours 04/01/20 1125 04/02/20 1731   04/02/20 1000  azithromycin (ZITHROMAX) tablet 500 mg  Status:  Discontinued     500 mg Oral Daily 04/01/20 1040 04/01/20 1103   04/01/20 1200  vancomycin (VANCOREADY) IVPB 2000 mg/400 mL     2,000 mg 200 mL/hr over 120 Minutes Intravenous  Once 04/01/20 1118 04/01/20 2004   04/01/20 1200  ceFEPIme (MAXIPIME) 2 g in sodium chloride 0.9 % 100 mL IVPB     2 g 200 mL/hr over 30 Minutes Intravenous Every 8 hours 04/01/20 1118     04/01/20 1100   azithromycin (ZITHROMAX) 500 mg in sodium chloride 0.9 % 250 mL IVPB  Status:  Discontinued     500 mg 250 mL/hr over 60 Minutes Intravenous Every 24 hours 04/01/20 1040 04/01/20 1103     Subjective: Seen and examined at bedside states that he is not as short of breath now.  No chest pain, lightheadedness or dizziness.  Feels okay but feels weak.  No other concerns or complaints at this time and states that he is not able to cough up much.  Objective: Vitals:   04/02/20 0500 04/02/20 0600 04/02/20 0700 04/02/20 0741  BP: 118/65 114/64 128/77   Pulse: 93 96 96 75  Resp: 16 (!) 28 20 14   Temp:      TempSrc:      SpO2: 96% 99% 100% 97%  Weight:      Height:        Intake/Output Summary (Last 24 hours) at 04/02/2020 0746 Last data filed at 04/02/2020 0321 Gross per 24 hour  Intake 1996.99 ml  Output --  Net 1996.99 ml   Filed Weights   04/01/20 1638  Weight: 102 kg   Examination: Physical Exam:  Constitutional: Overweight African-American male who is chronically ill-appearing currently in NAD but does appear a little anxious Eyes: Lids and conjunctivae normal, sclerae anicteric  ENMT: External Ears, Nose appear normal. Grossly normal hearing.  Has poor dentition Neck: Appears normal, supple, no cervical masses, normal ROM, no appreciable thyromegaly, no JVD Respiratory: Diminished to auscultation bilaterally with coarse breath sounds with worsening on the right compared to left and mild wheezing. Normal respiratory effort and patient is not tachypenic. No accessory muscle use.  Wearing supplemental oxygen via nasal cannula Cardiovascular: Tachycardic rate but regular rhythm; has a slight systolic murmur, 1+ lower extremity edema Abdomen: Soft, non-tender, distended secondary body habitus. Bowel sounds positive.  GU: Deferred. Musculoskeletal: No clubbing / cyanosis of digits/nails. No joint deformity upper and lower extremities.  Skin: No rashes, lesions, ulcers or skin  evaluation. No induration; Warm and dry.  Neurologic: CN 2-12 grossly intact with no focal deficits. Romberg sign cerebellar reflexes not assessed.  Psychiatric: Normal judgment and insight. Alert and oriented x 3.  Mildly anxious mood and appropriate affect.   Data Reviewed: I have personally reviewed following labs and imaging studies  CBC: Recent Labs  Lab 03/31/20 0900 04/01/20 0552 04/02/20 0304  WBC 11.8* 22.2* 18.3*  NEUTROABS 8.0* 20.4*  --   HGB 11.0* 11.7* 9.8*  HCT 37.8* 40.5 35.0*  MCV 102.4* 104.7* 106.1*  PLT 347 373 224   Basic Metabolic Panel: Recent Labs  Lab 03/31/20 0900 04/01/20 0552 04/02/20 0304  NA 140 140 137  K 5.4* 5.2* 5.2*  CL 93* 92* 94*  CO2 39* 38* 34*  GLUCOSE 229* 136* 159*  BUN  10 16 28*  CREATININE 0.90 0.93 0.94  CALCIUM 9.2 8.7* 7.7*   GFR: Estimated Creatinine Clearance: 83.9 mL/min (by C-G formula based on SCr of 0.94 mg/dL). Liver Function Tests: Recent Labs  Lab 03/31/20 0900 04/02/20 0304  AST 23 25  ALT 20 21  ALKPHOS 111 80  BILITOT 0.3 0.3  PROT 7.7 6.6  ALBUMIN 2.8* 2.6*   No results for input(s): LIPASE, AMYLASE in the last 168 hours. No results for input(s): AMMONIA in the last 168 hours. Coagulation Profile: No results for input(s): INR, PROTIME in the last 168 hours. Cardiac Enzymes: No results for input(s): CKTOTAL, CKMB, CKMBINDEX, TROPONINI in the last 168 hours. BNP (last 3 results) No results for input(s): PROBNP in the last 8760 hours. HbA1C: No results for input(s): HGBA1C in the last 72 hours. CBG: Recent Labs  Lab 04/01/20 1535  GLUCAP 113*   Lipid Profile: No results for input(s): CHOL, HDL, LDLCALC, TRIG, CHOLHDL, LDLDIRECT in the last 72 hours. Thyroid Function Tests: Recent Labs    03/31/20 0900  TSH 0.571   Anemia Panel: No results for input(s): VITAMINB12, FOLATE, FERRITIN, TIBC, IRON, RETICCTPCT in the last 72 hours. Sepsis Labs: Recent Labs  Lab 04/01/20 0552 04/02/20 0304    PROCALCITON  --  <0.10  LATICACIDVEN 0.9  --     Recent Results (from the past 240 hour(s))  SARS CORONAVIRUS 2 (TAT 6-24 HRS) Nasopharyngeal Nasopharyngeal Swab     Status: None   Collection Time: 04/01/20  6:03 AM   Specimen: Nasopharyngeal Swab  Result Value Ref Range Status   SARS Coronavirus 2 NEGATIVE NEGATIVE Final    Comment: (NOTE) SARS-CoV-2 target nucleic acids are NOT DETECTED. The SARS-CoV-2 RNA is generally detectable in upper and lower respiratory specimens during the acute phase of infection. Negative results do not preclude SARS-CoV-2 infection, do not rule out co-infections with other pathogens, and should not be used as the sole basis for treatment or other patient management decisions. Negative results must be combined with clinical observations, patient history, and epidemiological information. The expected result is Negative. Fact Sheet for Patients: SugarRoll.be Fact Sheet for Healthcare Providers: https://www.woods-mathews.com/ This test is not yet approved or cleared by the [place] FDA and  has been authorized for detection and/or diagnosis of SARS-CoV-2 by FDA under an Emergency Use Authorization (EUA). This EUA will remain  in effect (meaning this test can be used) for the duration of the COVID-19 declaration under Section 56 4(b)(1) of the Act, 21 U.S.C. section 360bbb-3(b)(1), unless the authorization is terminated or revoked sooner. Performed at [facility], [place] [address] [place], [place] [address]   Culture, blood (routine x 2)     Status: None (Preliminary result)   Collection Time: 04/01/20 11:49 AM   Specimen: BLOOD  Result Value Ref Range Status   Specimen Description   Final    BLOOD PORTA CATH LT Performed at [facility] [address] [place], [place] [address]    Special Requests   Final    BOTTLES DRAWN AEROBIC AND ANAEROBIC Blood Culture adequate  volume Performed at [facility] [address] [place], [place] [address]    Culture   Final    NO GROWTH < 24 HOURS Performed at [facility] [address] [place], [place] [address]    Report Status PENDING  Incomplete  Culture, blood (routine x 2)     Status: None (Preliminary result)   Collection Time:  04/01/20 11:49 AM   Specimen: BLOOD  Result Value Ref Range Status   Specimen Description   Final    BLOOD PORTA CATH LT Performed at [facility] [address] [place], [place] [address]    Special Requests   Final    BOTTLES DRAWN AEROBIC AND ANAEROBIC Blood Culture adequate volume Performed at [facility] [address], [place], [place] [address]    Culture   Final    NO GROWTH < 24 HOURS Performed at [facility] [address], [place], [place] [address]    Report Status PENDING  Incomplete  MRSA PCR Screening     Status: None   Collection Time: 04/01/20  6:00 PM   Specimen: Nasopharyngeal  Result Value Ref Range Status   MRSA by PCR NEGATIVE NEGATIVE Final    Comment:        The GeneXpert MRSA Assay (FDA approved for NASAL specimens only), is one component of a comprehensive MRSA colonization surveillance program. It is not intended to diagnose MRSA infection nor to guide or monitor treatment for MRSA infections. Performed at [facility], [place] [address] [place],  [address]      RN Pressure Injury Documentation:     Estimated body mass index is 29.67 kg/m as calculated from the following:   Height as of this encounter: 6\' 1"  (1.854 m).   Weight as of this encounter: 102 kg.  Malnutrition Type:      Malnutrition Characteristics:      Nutrition Interventions:    Radiology Studies: DG Chest 2 View  Result Date: 04/01/2020 CLINICAL DATA:  Worsening shortness of breath. History of non-small cell lung cancer. EXAM: CHEST - 2 VIEW  COMPARISON:  Chest x-ray 02/28/2020 and PET-CT 02/04/2020 FINDINGS: Stable right upper lobe lung mass and surrounding extensive radiation changes with loss of volume in the right hemithorax. Slight increase in surrounding right lung density which could reflect pneumonitis or interstitial spread of tumor. The left lung remains relatively clear. Left IJ power port in good position without complicating features. No pleural effusions or pneumothorax. IMPRESSION: 1. Stable right upper lobe lung mass and surrounding radiation changes. 2. Slight increase in surrounding right lung density which could reflect pneumonitis or interstitial spread of tumor. Electronically Signed   By: Marijo Sanes M.D.   On: 04/01/2020 06:32   CT HEAD WO CONTRAST  Result Date: 04/01/2020 CLINICAL DATA:  Altered mental status. Lethargy. EXAM: CT HEAD WITHOUT CONTRAST TECHNIQUE: Contiguous axial images were obtained from the base of the skull through the vertex without intravenous contrast. COMPARISON:  Head MRI 02/04/2016 FINDINGS: Brain: The study is mildly to moderately motion degraded despite an attempt at repeat imaging. Within this limitation, no acute large territory infarct, intracranial hemorrhage, mass, midline shift, or extra-axial fluid collection is identified. The ventricles and sulci are normal. Hypodensities in the cerebral white matter bilaterally are nonspecific but compatible with mild chronic small vessel ischemic disease. Vascular: Calcified atherosclerosis at the skull base. Skull: No fracture or suspicious osseous lesion. Sinuses/Orbits: Paranasal sinuses and mastoid air cells are clear. Bilateral cataract extraction. Other: None. IMPRESSION: 1. Motion degraded examination without evidence of acute intracranial abnormality. 2. Mild chronic small vessel ischemic disease. Electronically Signed   By: Logan Bores M.D.   On: 04/01/2020 16:41   CT Angio Chest PE W and/or Wo Contrast  Result Date: 04/01/2020 CLINICAL  DATA:  Hypoxemia. Shortness of breath. History of lung cancer. EXAM: CT  ANGIOGRAPHY CHEST WITH CONTRAST TECHNIQUE: Multidetector CT imaging of the chest was performed using the standard protocol during bolus administration of intravenous contrast. Multiplanar CT image reconstructions and MIPs were obtained to evaluate the vascular anatomy. CONTRAST:  250mL OMNIPAQUE IOHEXOL 350 MG/ML SOLN COMPARISON:  CT chest 01/20/2020.  PET-CT 02/04/2020. FINDINGS: Cardiovascular: The heart size is normal. No substantial pericardial effusion. Coronary artery calcification is evident. Atherosclerotic calcification is noted in the wall of the thoracic aorta. Left Port-A-Cath tip is positioned in the upper right atrium. No filling defects within the opacified pulmonary arteries to suggest the presence of an acute pulmonary embolus. Mediastinum/Nodes: No mediastinal lymphadenopathy. 11 mm short axis left hilar lymph node appears progressive in the interval. Consolidative changes are noted in the parahilar right lung. There is no axillary lymphadenopathy. The esophagus has normal imaging features. Lungs/Pleura: Centrilobular and paraseptal emphysema evident. Right upper lobe pulmonary mass measures 3.4 x 2.4 cm today compared to 3.0 x 2.6 cm previously. The collapse/consolidative changes in the right upper lobe are similar to minimally progressed in the interval. Since the prior study the patient has developed collapse/consolidative change in the right middle lobe as well. No substantial pleural effusion. Upper Abdomen: Right subdiaphragmatic soft tissue lesion measures 5.6 cm today compared to 4.8 cm previously. Musculoskeletal: No worrisome lytic or sclerotic osseous abnormality. Cortical erosion with fracture of the anterior fourth rib is similar to prior. Midline subcutaneous cystic lesion in the anterior chest wall is stable, compatible with sebaceous cyst. Review of the MIP images confirms the above findings. IMPRESSION: 1. No  CT evidence for acute pulmonary embolus. 2. Continued progression of the right upper lobe pulmonary mass with worsening right upper lobe collapse/consolidation and interval development of complete collapse/consolidation of the right middle lobe. No substantial pleural effusion at this time. 3. Slight progression of the right subdiaphragmatic soft tissue lesion posterior to hepatic dome. This lesion was not hypermetabolic on previous PET-CT. Continued attention on follow-up recommended. 4. Mild progression of left hilar lymphadenopathy. 5.  Emphysema (ICD10-J43.9) and Aortic Atherosclerosis (ICD10-170.0) Electronically Signed   By: Misty Stanley M.D.   On: 04/01/2020 09:38   Scheduled Meds: . allopurinol  100 mg Oral Daily  . aspirin EC  81 mg Oral QHS  . atorvastatin  80 mg Oral QHS  . brimonidine  1 drop Both Eyes BID  . budesonide (PULMICORT) nebulizer solution  0.25 mg Nebulization BID  . chlorhexidine  15 mL Mouth Rinse BID  . Chlorhexidine Gluconate Cloth  6 each Topical Daily  . clopidogrel  75 mg Oral Daily  . docusate sodium  100 mg Oral Daily  . enoxaparin (LOVENOX) injection  40 mg Subcutaneous Q24H  . ipratropium-albuterol  3 mL Nebulization BID  . mouth rinse  15 mL Mouth Rinse q12n4p  . methylPREDNISolone (SOLU-MEDROL) injection  60 mg Intravenous Q8H  . metoprolol succinate  25 mg Oral Daily  . pantoprazole  40 mg Oral Daily  . sodium chloride flush  10-40 mL Intracatheter Q12H  . tamsulosin  0.4 mg Oral Daily   Continuous Infusions: . ceFEPime (MAXIPIME) IV Stopped (04/02/20 9767)  . vancomycin      LOS: 1 day   Kerney Elbe, DO [facility] PAGER is on [place]  If 7PM-7AM, please contact night-coverage [email or web address]

## 2020-04-03 ENCOUNTER — Inpatient Hospital Stay: Payer: Medicare PPO

## 2020-04-03 ENCOUNTER — Inpatient Hospital Stay (HOSPITAL_COMMUNITY): Payer: Medicare PPO

## 2020-04-03 DIAGNOSIS — I2510 Atherosclerotic heart disease of native coronary artery without angina pectoris: Secondary | ICD-10-CM | POA: Diagnosis not present

## 2020-04-03 DIAGNOSIS — C3491 Malignant neoplasm of unspecified part of right bronchus or lung: Secondary | ICD-10-CM | POA: Diagnosis not present

## 2020-04-03 DIAGNOSIS — Z7189 Other specified counseling: Secondary | ICD-10-CM | POA: Diagnosis not present

## 2020-04-03 DIAGNOSIS — J9621 Acute and chronic respiratory failure with hypoxia: Secondary | ICD-10-CM | POA: Diagnosis not present

## 2020-04-03 DIAGNOSIS — J441 Chronic obstructive pulmonary disease with (acute) exacerbation: Secondary | ICD-10-CM | POA: Diagnosis not present

## 2020-04-03 LAB — CBC WITH DIFFERENTIAL/PLATELET
Abs Immature Granulocytes: 0.18 10*3/uL — ABNORMAL HIGH (ref 0.00–0.07)
Basophils Absolute: 0 10*3/uL (ref 0.0–0.1)
Basophils Relative: 0 %
Eosinophils Absolute: 0 10*3/uL (ref 0.0–0.5)
Eosinophils Relative: 0 %
HCT: 35.8 % — ABNORMAL LOW (ref 39.0–52.0)
Hemoglobin: 10.4 g/dL — ABNORMAL LOW (ref 13.0–17.0)
Immature Granulocytes: 1 %
Lymphocytes Relative: 2 %
Lymphs Abs: 0.4 10*3/uL — ABNORMAL LOW (ref 0.7–4.0)
MCH: 30.6 pg (ref 26.0–34.0)
MCHC: 29.1 g/dL — ABNORMAL LOW (ref 30.0–36.0)
MCV: 105.3 fL — ABNORMAL HIGH (ref 80.0–100.0)
Monocytes Absolute: 0.4 10*3/uL (ref 0.1–1.0)
Monocytes Relative: 2 %
Neutro Abs: 16.2 10*3/uL — ABNORMAL HIGH (ref 1.7–7.7)
Neutrophils Relative %: 95 %
Platelets: 317 10*3/uL (ref 150–400)
RBC: 3.4 MIL/uL — ABNORMAL LOW (ref 4.22–5.81)
RDW: 15.5 % (ref 11.5–15.5)
WBC: 17.1 10*3/uL — ABNORMAL HIGH (ref 4.0–10.5)
nRBC: 0.1 % (ref 0.0–0.2)

## 2020-04-03 LAB — COMPREHENSIVE METABOLIC PANEL
ALT: 23 U/L (ref 0–44)
AST: 26 U/L (ref 15–41)
Albumin: 2.8 g/dL — ABNORMAL LOW (ref 3.5–5.0)
Alkaline Phosphatase: 78 U/L (ref 38–126)
Anion gap: 5 (ref 5–15)
BUN: 33 mg/dL — ABNORMAL HIGH (ref 8–23)
CO2: 40 mmol/L — ABNORMAL HIGH (ref 22–32)
Calcium: 8.2 mg/dL — ABNORMAL LOW (ref 8.9–10.3)
Chloride: 92 mmol/L — ABNORMAL LOW (ref 98–111)
Creatinine, Ser: 0.95 mg/dL (ref 0.61–1.24)
GFR calc Af Amer: >60 mL/min (ref >60)
GFR calc non Af Amer: >60 mL/min (ref >60)
Glucose, Bld: 171 mg/dL — ABNORMAL HIGH (ref 70–99)
Potassium: 5.6 mmol/L — ABNORMAL HIGH (ref 3.5–5.1)
Sodium: 137 mmol/L (ref 135–145)
Total Bilirubin: 0.3 mg/dL (ref 0.3–1.2)
Total Protein: 7.3 g/dL (ref 6.5–8.1)

## 2020-04-03 LAB — PHOSPHORUS: Phosphorus: 3.3 mg/dL (ref 2.5–4.6)

## 2020-04-03 LAB — MAGNESIUM: Magnesium: 2.4 mg/dL (ref 1.7–2.4)

## 2020-04-03 MED ORDER — LORAZEPAM 2 MG/ML IJ SOLN
1.0000 mg | INTRAMUSCULAR | Status: AC | PRN
  Administered 2020-04-03 – 2020-04-05 (×2): 1 mg via INTRAVENOUS
  Filled 2020-04-03 (×2): qty 1

## 2020-04-03 MED ORDER — SODIUM ZIRCONIUM CYCLOSILICATE 10 G PO PACK
10.0000 g | PACK | Freq: Every day | ORAL | Status: DC
  Administered 2020-04-03: 10 g via ORAL
  Filled 2020-04-03 (×2): qty 1

## 2020-04-03 NOTE — Progress Notes (Signed)
SLP Cancellation Note  Patient Details Name: AQUILA DELAUGHTER MRN: [id number] DOB: 12/04/1941   Cancelled treatment:       Reason Eval/Treat Not Completed: (pt on phone *RN holding phone*, RN advised pt tolerating po well, will continue efforts)   Macario Golds 04/03/2020, 4:06 PM  Kathleen Lime, MS [place] Office [phone]

## 2020-04-03 NOTE — Progress Notes (Signed)
Pharmacy Antibiotic Note  Ryan Contreras is a 79 y.o. male admitted on 04/01/2020 with sepsis due to PNA.  Pharmacy is consulted for cefepime dosing.  04/03/2020:  Day#3 Cefepime  Afebrile  WBC trending down  PCT <0.1  Renal fxn stable  Plan:  Continue Cefepime 2 g IV q8h   No dose adjustments anticipated.  Pharmacy will sign off & monitor peripherally via electronic surveillance software.  Please re-consult if needed.   Duration of therapy per MD.  Consider d/c after 5-7 days of therapy if clinically improving.    Height: 6\' 1"  (185.4 cm) Weight: 102 kg (224 lb 13.9 oz) IBW/kg (Calculated) : 79.9  Temp (24hrs), Avg:97.4 F (36.3 C), Min:97 F (36.1 C), Max:97.7 F (36.5 C)  Recent Labs  Lab 03/31/20 0900 04/01/20 0552 04/02/20 0304 04/03/20 0301  WBC 11.8* 22.2* 18.3* 17.1*  CREATININE 0.90 0.93 0.94 0.95  LATICACIDVEN  --  0.9  --   --     Estimated Creatinine Clearance: 83 mL/min (by C-G formula based on SCr of 0.95 mg/dL).    No Known Allergies  Antimicrobials this admission: 4/21 cefepime >>  4/21 vancomycin >> 4/22  Dose adjustments this admission:   Microbiology results: 4/21 BCx: NGTD 4/21 MRSA PCR: negative  Thank you for allowing pharmacy to be a part of this patient's care.  Netta Cedars, PharmD, BCPS 04/03/2020 8:41 AM

## 2020-04-03 NOTE — Progress Notes (Signed)
PROGRESS NOTE    Ryan Contreras  ZOX:[id number] DOB: Nov 03, 1941 DOA: 04/01/2020 PCP: Cyndi Bender, PA-C   Brief Narrative: HPI per Dr. Theda Belfast on 04/01/20 Ryan Contreras is a 79 y.o. 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  Patient is a poor historian and does not provide accurate history.  I have reviewed patient's medical records and communicated with Dr. Julien Nordmann and Dr. Kyung Rudd as well.  Patient had his second cycle of current chemotherapy on 03/31/2020 and presented today to the ED with worsening shortness of breath.  At baseline, he wears 2 to 3 L of oxygen at home.  Patient complains of progressively worsening shortness of breath even with minimal exertion.  He denies fevers or worsening cough.  No chest pain.  Denies nausea, vomiting, diarrhea, abdominal pain, dysuria.  He does report decreased p.o. intake.  No known Covid exposures.  ED Course: He required 4 to 5 L oxygen via nasal cannula in the ED; his oxygen saturation was found to be 81% by EMS.  Chest x-ray showed possible worsening of mass.  CT of the chest was negative for PE but showed worsening of his cancer along with worsening of right upper lobe collapse and new right middle lobe collapse.  ED provider updated Dr. Julien Nordmann.  Hospitalist service was called to evaluate the patient.  **Interim History And up on BiPAP yesterday and was weaned off of it.  SLP evaluated and recommending dysphagia 3 diet.  PT OT recommending SNF.  Pulmonary and medical oncology evaluated recommending IV antibiotics and steroids.  Palliative care medicine was consulted for goals of care  discussion and will continue full scope of treatment currently while we continue palliative care discussions.  We do have a meeting with Dr. Domingo Cocking later today.  Assessment & Plan:   Principal Problem:   Acute on chronic respiratory failure (HCC) Active Problems:   Hyperlipidemia   Gout   Essential hypertension   CAD, NATIVE VESSEL   COPD with acute exacerbation (HCC)   Primary malignant neoplasm of right lung metastatic to other site [facility])  Acute on Chronic hypoxic respiratory failure quiring noninvasive positive pressure ventilation with BiPAP, a little improved COPD exacerbation Recurrent non-small cell lung cancer currently on chemotherapy causing right upper lobe and right middle lobe collapse/consolidation Probable postobstructive pneumonia -Patient normally wears 2 to 3 L oxygen by nasal cannula at home.  Currently requiring 4 to 5 L oxygen and then had to be placed on BiPAP and was weaned off of BiPAP yesterday morning; have added BiPAP nightly given the concern for recurrent hypercarbia -Done the day before yesterday and showed a pH of 7.150, PCO2 of 116, PO2 of 94.8, bicarbonate level 38.7, and ABG O2 saturation 95.7% -CT of the chest showing right upper lobe and right middle lobe collapse/consolidation with possibility of postobstructive pneumonia -Patient was started on Solu-Medrol in the ED.  Will continue Solu-Medrol 60 mg IV every 8 hours.   -Continue Trelegy.  Continue nebs.  Incentive spirometry/flutter valve -We will use broad-spectrum antibiotics cefepime/vancomycin.  Also will order blood cultures -Palliative Care consultation for goals of care discussion.  I have notified Dr. Mohamed/oncology about the patient: Patient requests evaluation by Dr.  Mohamed while inpatient; unclear if Dr. Earlie Server has seen the patient or not  -Pulmonary was consulted regarding the patient who recommends continuing broad-spectrum antibiotics and steroids .   -Recommend continuing  supplemental oxygen via nasal cannula, aggressive pulmonary hygiene and following intermittent chest x-ray and ABG -Pulmonary is in agreement with palliative care involvement and have signed off the case -SLP evaluation recommending a dysphagia 3 diet -Adequate oncology recommends continuing IV antibiotics and Solu-Medrol and if the patient's status continues deteriorate there agreement of pursuing palliative care and hospice for this patient and I will continue to follow along -Patient's CO2 is 40, anion gap is 5 -Because he had extravasation of contrast on the right upper extremity and he is undergoing the extravasation protocol -Continue to monitor respiratory status carefully and repeat chest x-ray in the a.m. -dietitian consulted and recommending boost breeze once a day and Ensure Enlive p.o. twice daily as well as a daily multivitamin -PT OT recommending skilled nursing facility -Palliative care was consulted for further goals of care treatment and they discussed with medical oncology today and the plan is to continue to follow his clinical course with treatment of his pneumonia and continue full scope of treatment as well as continue conversation regarding long-term goals of care based on his clinical course for the duration of his admission; will continue palliative care discussions as likely his cancer is not going to improve with chemotherapy -The patient is hopeful to pursue disease modifying therapy for his lung cancer but unsure if this is feasible -repeat chest x-ray in a.m.  Leukocytosis -Probably from above is trending down now.  -WBC went from 22.2 and is now 17.1 -Continue to monitor and trend and repeat CBC in a.m.  Chronic Macrocytic Anemia -Patient's hemoglobin/hematocrit went from 11.7/40.5 and is now at 9.8/35.0 yesterday and today it is 10.4/35.8 -MCV was 105.3 -Check anemia panel in the a.m for the B12 and folate level -Continue monitor for signs and symptoms of  bleeding; currently no overt bleeding on  -repeat CBC in a.m.  Hypertension -Continue to Monitor BP carefully.   -Continue metoprolol for now.  -Eplerenone held for now. -Last BP was 143/66  Hyperkalemia -Mild at 5.2 today it was 5.6 -Continue to Hold Potassium Supplementation -Continue to Monitor and Trend -Currently holding Eplerenone  -Because it remains elevated we will start Lokelma 10 mg p.o. daily -Repeat CMP in AM   History of Coronary Artery Disease -No chest pain.   -Continue ASA 81 mg po Daily, Clopidogrel 75 mg po Daily, Atrovastatin 80 mg po qHS as well as beta-blocker with Metoprolol Succinate 25 mg po Daily.  -Outpatient follow-up with cardiology  Hyperlipidemia -Continue Atorvastatin 80 mg po qHS  Gout -Continue Allopurinol 100 mg po Daily   Generalized deconditioning -Overall prognosis is guarded to poor.   -Palliative care consultation and appreciate continued goals of care discussion.   -PT evaluation done and they are recommending SNF so we will need to consult transition of care to assist with placement if the patient wants to pursue SNF  DVT prophylaxis: Enoxaparin 40 mg sq q24h Code Status: FULL CODE  Family Communication: No family present at bedside  Disposition Plan: Patient is from home now requiring SNF level of care and will need to have improvement of his respiratory status prior to safe discharge  Status is: Inpatient  Remains inpatient appropriate because:Inpatient level of care appropriate due to severity of illness   Dispo: The patient is from: Home  Anticipated d/c is to: SNF              Anticipated d/c date is: 2 days              Patient currently is not medically stable to d/c.  Consultants:   Medical Oncology  Pulmonary Critical Care  Interventional Radiology    Procedures: None   Antimicrobials:  Anti-infectives (From admission, onward)   Start     Dose/Rate Route Frequency Ordered Stop    04/02/20 1800  vancomycin (VANCOREADY) IVPB 1750 mg/350 mL  Status:  Discontinued     1,750 mg 175 mL/hr over 120 Minutes Intravenous Every 24 hours 04/01/20 1125 04/02/20 1731   04/02/20 1000  azithromycin (ZITHROMAX) tablet 500 mg  Status:  Discontinued     500 mg Oral Daily 04/01/20 1040 04/01/20 1103   04/01/20 1200  vancomycin (VANCOREADY) IVPB 2000 mg/400 mL     2,000 mg 200 mL/hr over 120 Minutes Intravenous  Once 04/01/20 1118 04/01/20 2004   04/01/20 1200  ceFEPIme (MAXIPIME) 2 g in sodium chloride 0.9 % 100 mL IVPB     2 g 200 mL/hr over 30 Minutes Intravenous Every 8 hours 04/01/20 1118     04/01/20 1100  azithromycin (ZITHROMAX) 500 mg in sodium chloride 0.9 % 250 mL IVPB  Status:  Discontinued     500 mg 250 mL/hr over 60 Minutes Intravenous Every 24 hours 04/01/20 1040 04/01/20 1103     Subjective: Seen and examined at bedside states he is feeling a little bit better but still remains a little short of breath.  No chest pain, lightheadedness or dizziness.  Had a bowel movement early this morning.  No other concerns or complaints at this time there on the afternoon he became a little bit more somnolent we have ordered BiPAP for nightly.  Objective: Vitals:   04/03/20 1500 04/03/20 1600 04/03/20 1700 04/03/20 1800  BP: (!) 142/78 (!) 167/88 (!) 180/86 (!) 149/76  Pulse: (!) 111 (!) 113 (!) 113 (!) 117  Resp: 20 (!) 21 20 19   Temp:  (!) 97.4 F (36.3 C)    TempSrc:  Oral    SpO2: 100% 100% 100% 100%  Weight:      Height:        Intake/Output Summary (Last 24 hours) at 04/03/2020 1916 Last data filed at 04/03/2020 3546 Gross per 24 hour  Intake 1040 ml  Output 3900 ml  Net -2860 ml   Filed Weights   04/01/20 1638  Weight: 102 kg   Examination: Physical Exam:  Constitutional: Overweight chronically ill-appearing African-American male who does have a mildly anxious affect and does appear somewhat uncomfortable Eyes: Lids and conjunctivae normal, sclerae anicteric   ENMT: External Ears, Nose appear normal. Grossly normal hearing.  Neck: Appears normal, supple, no cervical masses, normal ROM, no appreciable thyromegaly: No appreciable JVD Respiratory: Diminished to auscultation bilaterally with coarse breath sounds with worsening sounds in the right compared to left with slight wheezing.  He does have a slightly increased respiratory effort but is not using any accessory muscles to breathe but is wearing supplemental oxygen via nasal cannula Cardiovascular: Tachycardic rate but regular rhythm, no murmurs / rubs / gallops. S1 and S2 auscultated.  Continues to have 1+ lower extremity edema Abdomen: Soft, non-tender, distended secondary body habitus. Bowel sounds positive.  GU: Deferred. Musculoskeletal: No clubbing / cyanosis of digits/nails. No joint deformity upper and lower extremities.  Skin: No rashes, lesions, ulcers on limited skin  evaluation. No induration; Warm and dry.  Neurologic: CN 2-12 grossly intact with no focal deficits. Romberg sign and cerebellar reflexes not assessed.  Psychiatric: Normal judgment and insight. Alert and oriented x 3.  Slightly anxious mood and appropriate affect.   Data Reviewed: I have personally reviewed following labs and imaging studies  CBC: Recent Labs  Lab 03/31/20 0900 04/01/20 0552 04/02/20 0304 04/03/20 0301  WBC 11.8* 22.2* 18.3* 17.1*  NEUTROABS 8.0* 20.4*  --  16.2*  HGB 11.0* 11.7* 9.8* 10.4*  HCT 37.8* 40.5 35.0* 35.8*  MCV 102.4* 104.7* 106.1* 105.3*  PLT 347 373 304 185   Basic Metabolic Panel: Recent Labs  Lab 03/31/20 0900 04/01/20 0552 04/02/20 0304 04/03/20 0301  NA 140 140 137 137  K 5.4* 5.2* 5.2* 5.6*  CL 93* 92* 94* 92*  CO2 39* 38* 34* 40*  GLUCOSE 229* 136* 159* 171*  BUN 10 16 28* 33*  CREATININE 0.90 0.93 0.94 0.95  CALCIUM 9.2 8.7* 7.7* 8.2*  MG  --   --   --  2.4  PHOS  --   --   --  3.3   GFR: Estimated Creatinine Clearance: 83 mL/min (by C-G formula based on SCr of  0.95 mg/dL). Liver Function Tests: Recent Labs  Lab 03/31/20 0900 04/02/20 0304 04/03/20 0301  AST 23 25 26   ALT 20 21 23   ALKPHOS 111 80 78  BILITOT 0.3 0.3 0.3  PROT 7.7 6.6 7.3  ALBUMIN 2.8* 2.6* 2.8*   No results for input(s): LIPASE, AMYLASE in the last 168 hours. No results for input(s): AMMONIA in the last 168 hours. Coagulation Profile: No results for input(s): INR, PROTIME in the last 168 hours. Cardiac Enzymes: No results for input(s): CKTOTAL, CKMB, CKMBINDEX, TROPONINI in the last 168 hours. BNP (last 3 results) No results for input(s): PROBNP in the last 8760 hours. HbA1C: No results for input(s): HGBA1C in the last 72 hours. CBG: Recent Labs  Lab 04/01/20 1535  GLUCAP 113*   Lipid Profile: No results for input(s): CHOL, HDL, LDLCALC, TRIG, CHOLHDL, LDLDIRECT in the last 72 hours. Thyroid Function Tests: No results for input(s): TSH, T4TOTAL, FREET4, T3FREE, THYROIDAB in the last 72 hours. Anemia Panel: No results for input(s): VITAMINB12, FOLATE, FERRITIN, TIBC, IRON, RETICCTPCT in the last 72 hours. Sepsis Labs: Recent Labs  Lab 04/01/20 0552 04/02/20 0304  PROCALCITON  --  <0.10  LATICACIDVEN 0.9  --     Recent Results (from the past 240 hour(s))  SARS CORONAVIRUS 2 (TAT 6-24 HRS) Nasopharyngeal Nasopharyngeal Swab     Status: None   Collection Time: 04/01/20  6:03 AM   Specimen: Nasopharyngeal Swab  Result Value Ref Range Status   SARS Coronavirus 2 NEGATIVE NEGATIVE Final    Comment: (NOTE) SARS-CoV-2 target nucleic acids are NOT DETECTED. The SARS-CoV-2 RNA is generally detectable in upper and lower respiratory specimens during the acute phase of infection. Negative results do not preclude SARS-CoV-2 infection, do not rule out co-infections with other pathogens, and should not be used as the sole basis for treatment or other patient management decisions. Negative results must be combined with clinical observations, patient history, and  epidemiological information. The expected result is Negative. Fact Sheet for Patients: SugarRoll.be Fact Sheet for Healthcare Providers: https://www.woods-mathews.com/ This test is not yet approved or cleared by the [place] FDA and  has been authorized for detection and/or diagnosis of SARS-CoV-2 by FDA under an Emergency Use Authorization (EUA). This EUA will remain  in effect (  meaning this test can be used) for the duration of the COVID-19 declaration under Section 56 4(b)(1) of the Act, 21 U.S.C. section 360bbb-3(b)(1), unless the authorization is terminated or revoked sooner. Performed at [facility], [place] [address] [place], [place] [address]   MRSA culture     Status: None   Collection Time: 04/01/20 11:49 AM   Specimen: Body Fluid  Result Value Ref Range Status   Specimen Description   Final    NASAL SWAB Performed at [facility] [address], [place], [place] [address]    Special Requests   Final    NONE Performed at [facility], [place] [address], [place], [place] [address]    Culture   Final    NO GROWTH Performed at [facility], [place] [address], [place], [place] [address]    Report Status 04/02/2020 FINAL  Final  Culture, blood (routine x 2)     Status: None (Preliminary result)   Collection Time: 04/01/20 11:49 AM   Specimen: BLOOD  Result Value Ref Range Status   Specimen Description   Final    BLOOD PORTA CATH LT Performed at [place] [address] [place], [place] [address]    Special Requests   Final    BOTTLES DRAWN AEROBIC AND ANAEROBIC Blood Culture adequate volume Performed at [facility] [address] [place], [place] [address]    Culture   Final    NO GROWTH 2 DAYS Performed at [place] [address] [place], [place] [address]    Report Status PENDING  Incomplete  Culture, blood (routine  x 2)     Status: None (Preliminary result)   Collection Time: 04/01/20 11:49 AM   Specimen: BLOOD  Result Value Ref Range Status   Specimen Description   Final    BLOOD PORTA CATH LT Performed at [facility] [address] [place], [place] [address]    Special Requests   Final    BOTTLES DRAWN AEROBIC AND ANAEROBIC Blood Culture adequate volume Performed at [facility] [address], [place], [place] [address]    Culture   Final    NO GROWTH 2 DAYS Performed at [facility] [address] [place], [place] [address]    Report Status PENDING  Incomplete  MRSA PCR Screening     Status: None   Collection Time: 04/01/20  6:00 PM   Specimen: Nasopharyngeal  Result Value Ref Range Status   MRSA by PCR NEGATIVE NEGATIVE Final    Comment:        The GeneXpert MRSA Assay (FDA approved for NASAL specimens only), is one component of a comprehensive MRSA colonization surveillance program. It is not intended to diagnose MRSA infection nor to guide or monitor treatment for MRSA infections. Performed at [facility], [place] [address], [place], [place] [address]      RN Pressure Injury Documentation:     Estimated body mass index is 29.67 kg/m as calculated from the following:   Height as of this encounter: 6\' 1"  (1.854 m).   Weight as of this encounter: 102 kg.  Malnutrition Type:  Nutrition Problem: Increased nutrient needs Etiology: acute illness, chronic illness, cancer and cancer related treatments   Malnutrition Characteristics:  Signs/Symptoms: estimated needs   Nutrition Interventions:  Interventions: Ensure Enlive (each supplement provides 350kcal and 20 grams of protein), MVI, Boost [facility]  Studies: DG CHEST PORT 1 VIEW  Result Date: 04/03/2020 CLINICAL DATA:  Shortness of breath. EXAM: PORTABLE CHEST 1 VIEW COMPARISON:  04/01/2020 chest x-ray and CT scan. FINDINGS: The left IJ power  port is stable. The cardiac silhouette, mediastinal and hilar contours are unchanged. Stable right upper lobe lung mass and surrounding radiation fibrosis. Overall slight worsening right lung aeration which could be due to surrounding pneumonitis or progressive atelectasis. The left lung remains clear. No pneumothorax. IMPRESSION: 1. Stable right upper lobe lung mass and surrounding radiation fibrosis. 2. Slight worsening right lung aeration possibly due to surrounding pneumonitis or progressive atelectasis. Electronically Signed   By: Marijo Sanes M.D.   On: 04/03/2020 07:06   Scheduled Meds:  allopurinol  100 mg Oral Daily   aspirin EC  81 mg Oral QHS   atorvastatin  80 mg Oral QHS   brimonidine  1 drop Both Eyes BID   budesonide (PULMICORT) nebulizer solution  0.25 mg Nebulization BID   chlorhexidine  15 mL Mouth Rinse BID   Chlorhexidine Gluconate Cloth  6 each Topical Daily   clopidogrel  75 mg Oral Daily   docusate sodium  100 mg Oral Daily   enoxaparin (LOVENOX) injection  40 mg Subcutaneous Q24H   feeding supplement  1 Container Oral Q24H   feeding supplement (ENSURE ENLIVE)  237 mL Oral BID BM   ipratropium-albuterol  3 mL Nebulization BID   mouth rinse  15 mL Mouth Rinse q12n4p   methylPREDNISolone (SOLU-MEDROL) injection  60 mg Intravenous Q8H   metoprolol succinate  25 mg Oral Daily   multivitamin with minerals  1 tablet Oral Daily   pantoprazole  40 mg Oral Daily   sodium chloride flush  10-40 mL Intracatheter Q12H   sodium zirconium cyclosilicate  10 g Oral Daily   tamsulosin  0.4 mg Oral Daily   Continuous Infusions:  ceFEPime (MAXIPIME) IV Stopped (04/03/20 1135)    LOS: 2 days   Kerney Elbe, DO [facility] PAGER is on AMION  If 7PM-7AM, please contact night-coverage [email or web address]

## 2020-04-03 NOTE — Plan of Care (Signed)
Patient resting in bed after breakfast and meds this am. Patient denies any needs at this time. Daughter updated via phone and requested the Oncologist that just saw patient to call daughters. Will continue to monitor.  Problem: Education: Goal: Knowledge of General Education information will improve Description: Including pain rating scale, medication(s)/side effects and non-pharmacologic comfort measures Outcome: Progressing   Problem: Health Behavior/Discharge Planning: Goal: Ability to manage health-related needs will improve Outcome: Progressing   Problem: Clinical Measurements: Goal: Ability to maintain clinical measurements within normal limits will improve Outcome: Progressing Goal: Will remain free from infection Outcome: Progressing Goal: Diagnostic test results will improve Outcome: Progressing Goal: Respiratory complications will improve Outcome: Progressing Goal: Cardiovascular complication will be avoided Outcome: Progressing   Problem: Activity: Goal: Risk for activity intolerance will decrease Outcome: Progressing   Problem: Nutrition: Goal: Adequate nutrition will be maintained Outcome: Progressing   Problem: Coping: Goal: Level of anxiety will decrease Outcome: Progressing   Problem: Elimination: Goal: Will not experience complications related to bowel motility Outcome: Progressing Goal: Will not experience complications related to urinary retention Outcome: Progressing   Problem: Pain Managment: Goal: General experience of comfort will improve Outcome: Progressing   Problem: Safety: Goal: Ability to remain free from injury will improve Outcome: Progressing   Problem: Skin Integrity: Goal: Risk for impaired skin integrity will decrease Outcome: Progressing

## 2020-04-04 ENCOUNTER — Inpatient Hospital Stay (HOSPITAL_COMMUNITY): Payer: Medicare PPO

## 2020-04-04 DIAGNOSIS — J9601 Acute respiratory failure with hypoxia: Secondary | ICD-10-CM

## 2020-04-04 DIAGNOSIS — J9621 Acute and chronic respiratory failure with hypoxia: Secondary | ICD-10-CM | POA: Diagnosis not present

## 2020-04-04 DIAGNOSIS — C3491 Malignant neoplasm of unspecified part of right bronchus or lung: Secondary | ICD-10-CM | POA: Diagnosis not present

## 2020-04-04 DIAGNOSIS — I2510 Atherosclerotic heart disease of native coronary artery without angina pectoris: Secondary | ICD-10-CM | POA: Diagnosis not present

## 2020-04-04 DIAGNOSIS — J441 Chronic obstructive pulmonary disease with (acute) exacerbation: Secondary | ICD-10-CM | POA: Diagnosis not present

## 2020-04-04 DIAGNOSIS — J9602 Acute respiratory failure with hypercapnia: Secondary | ICD-10-CM

## 2020-04-04 DIAGNOSIS — Z7189 Other specified counseling: Secondary | ICD-10-CM | POA: Diagnosis not present

## 2020-04-04 DIAGNOSIS — R0689 Other abnormalities of breathing: Secondary | ICD-10-CM

## 2020-04-04 LAB — BLOOD GAS, ARTERIAL
Acid-Base Excess: 14.6 mmol/L — ABNORMAL HIGH (ref 0.0–2.0)
Bicarbonate: 44.3 mmol/L — ABNORMAL HIGH (ref 20.0–28.0)
O2 Content: 4 L/min
O2 Saturation: 94.9 %
Patient temperature: 98.6
pCO2 arterial: 98.6 mmHg (ref 32.0–48.0)
pH, Arterial: 7.275 — ABNORMAL LOW (ref 7.350–7.450)
pO2, Arterial: 83 mmHg (ref 83.0–108.0)

## 2020-04-04 LAB — CBC WITH DIFFERENTIAL/PLATELET
Abs Immature Granulocytes: 0.1 10*3/uL — ABNORMAL HIGH (ref 0.00–0.07)
Abs Immature Granulocytes: 0.11 10*3/uL — ABNORMAL HIGH (ref 0.00–0.07)
Basophils Absolute: 0 10*3/uL (ref 0.0–0.1)
Basophils Absolute: 0 10*3/uL (ref 0.0–0.1)
Basophils Relative: 0 %
Basophils Relative: 0 %
Eosinophils Absolute: 0 10*3/uL (ref 0.0–0.5)
Eosinophils Absolute: 0 10*3/uL (ref 0.0–0.5)
Eosinophils Relative: 0 %
Eosinophils Relative: 0 %
HCT: 24.2 % — ABNORMAL LOW (ref 39.0–52.0)
HCT: 33.7 % — ABNORMAL LOW (ref 39.0–52.0)
Hemoglobin: 7.1 g/dL — ABNORMAL LOW (ref 13.0–17.0)
Hemoglobin: 10.2 g/dL — ABNORMAL LOW (ref 13.0–17.0)
Immature Granulocytes: 1 %
Immature Granulocytes: 1 %
Lymphocytes Relative: 4 %
Lymphocytes Relative: 3 %
Lymphs Abs: 0.4 10*3/uL — ABNORMAL LOW (ref 0.7–4.0)
Lymphs Abs: 0.3 10*3/uL — ABNORMAL LOW (ref 0.7–4.0)
MCH: 30.7 pg (ref 26.0–34.0)
MCH: 31 pg (ref 26.0–34.0)
MCHC: 29.3 g/dL — ABNORMAL LOW (ref 30.0–36.0)
MCHC: 30.3 g/dL (ref 30.0–36.0)
MCV: 104.8 fL — ABNORMAL HIGH (ref 80.0–100.0)
MCV: 102.4 fL — ABNORMAL HIGH (ref 80.0–100.0)
Monocytes Absolute: 0.2 10*3/uL (ref 0.1–1.0)
Monocytes Absolute: 0.1 10*3/uL (ref 0.1–1.0)
Monocytes Relative: 2 %
Monocytes Relative: 1 %
Neutro Abs: 9.2 10*3/uL — ABNORMAL HIGH (ref 1.7–7.7)
Neutro Abs: 12 10*3/uL — ABNORMAL HIGH (ref 1.7–7.7)
Neutrophils Relative %: 93 %
Neutrophils Relative %: 95 %
Platelets: 198 10*3/uL (ref 150–400)
Platelets: 265 10*3/uL (ref 150–400)
RBC: 2.31 MIL/uL — ABNORMAL LOW (ref 4.22–5.81)
RBC: 3.29 MIL/uL — ABNORMAL LOW (ref 4.22–5.81)
RDW: 15.3 % (ref 11.5–15.5)
RDW: 15.5 % (ref 11.5–15.5)
WBC: 9.8 10*3/uL (ref 4.0–10.5)
WBC: 12.5 10*3/uL — ABNORMAL HIGH (ref 4.0–10.5)
nRBC: 0 % (ref 0.0–0.2)
nRBC: 0 % (ref 0.0–0.2)

## 2020-04-04 LAB — COMPREHENSIVE METABOLIC PANEL
ALT: 17 U/L (ref 0–44)
AST: 19 U/L (ref 15–41)
Albumin: 2 g/dL — ABNORMAL LOW (ref 3.5–5.0)
Alkaline Phosphatase: 49 U/L (ref 38–126)
Anion gap: 5 (ref 5–15)
BUN: 23 mg/dL (ref 8–23)
CO2: 33 mmol/L — ABNORMAL HIGH (ref 22–32)
Calcium: 6.3 mg/dL — CL (ref 8.9–10.3)
Chloride: 103 mmol/L (ref 98–111)
Creatinine, Ser: 0.43 mg/dL — ABNORMAL LOW (ref 0.61–1.24)
GFR calc Af Amer: >60 mL/min (ref >60)
GFR calc non Af Amer: >60 mL/min (ref >60)
Glucose, Bld: 116 mg/dL — ABNORMAL HIGH (ref 70–99)
Potassium: 3.5 mmol/L (ref 3.5–5.1)
Sodium: 141 mmol/L (ref 135–145)
Total Bilirubin: 0.4 mg/dL (ref 0.3–1.2)
Total Protein: 4.8 g/dL — ABNORMAL LOW (ref 6.5–8.1)

## 2020-04-04 LAB — BASIC METABOLIC PANEL
Anion gap: 9 (ref 5–15)
BUN: 32 mg/dL — ABNORMAL HIGH (ref 8–23)
CO2: 40 mmol/L — ABNORMAL HIGH (ref 22–32)
Calcium: 8.7 mg/dL — ABNORMAL LOW (ref 8.9–10.3)
Chloride: 88 mmol/L — ABNORMAL LOW (ref 98–111)
Creatinine, Ser: 0.81 mg/dL (ref 0.61–1.24)
GFR calc Af Amer: >60 mL/min (ref >60)
GFR calc non Af Amer: >60 mL/min (ref >60)
Glucose, Bld: 294 mg/dL — ABNORMAL HIGH (ref 70–99)
Potassium: 4.9 mmol/L (ref 3.5–5.1)
Sodium: 137 mmol/L (ref 135–145)

## 2020-04-04 LAB — PHOSPHORUS: Phosphorus: 2.2 mg/dL — ABNORMAL LOW (ref 2.5–4.6)

## 2020-04-04 LAB — TYPE AND SCREEN
ABO/RH(D): O POS
Antibody Screen: NEGATIVE

## 2020-04-04 LAB — MAGNESIUM: Magnesium: 1.6 mg/dL — ABNORMAL LOW (ref 1.7–2.4)

## 2020-04-04 LAB — ABO/RH: ABO/RH(D): O POS

## 2020-04-04 MED ORDER — METHYLPREDNISOLONE SODIUM SUCC 125 MG IJ SOLR
60.0000 mg | Freq: Two times a day (BID) | INTRAMUSCULAR | Status: DC
  Administered 2020-04-05 (×2): 60 mg via INTRAVENOUS
  Filled 2020-04-04 (×2): qty 2

## 2020-04-04 MED ORDER — FUROSEMIDE 10 MG/ML IJ SOLN
40.0000 mg | Freq: Once | INTRAMUSCULAR | Status: AC
  Administered 2020-04-04: 40 mg via INTRAVENOUS
  Filled 2020-04-04: qty 4

## 2020-04-04 MED ORDER — HALOPERIDOL LACTATE 5 MG/ML IJ SOLN
2.0000 mg | Freq: Four times a day (QID) | INTRAMUSCULAR | Status: DC | PRN
  Administered 2020-04-04 – 2020-04-08 (×7): 2 mg via INTRAVENOUS
  Filled 2020-04-04 (×7): qty 1

## 2020-04-04 MED ORDER — CALCIUM GLUCONATE-NACL 1-0.675 GM/50ML-% IV SOLN
1.0000 g | Freq: Once | INTRAVENOUS | Status: AC
  Administered 2020-04-04: 1000 mg via INTRAVENOUS
  Filled 2020-04-04: qty 50

## 2020-04-04 MED ORDER — MORPHINE SULFATE (PF) 2 MG/ML IV SOLN
1.0000 mg | INTRAVENOUS | Status: DC | PRN
  Administered 2020-04-04 – 2020-04-09 (×8): 1 mg via INTRAVENOUS
  Filled 2020-04-04 (×8): qty 1

## 2020-04-04 MED ORDER — K PHOS MONO-SOD PHOS DI & MONO 155-852-130 MG PO TABS
500.0000 mg | ORAL_TABLET | Freq: Two times a day (BID) | ORAL | Status: AC
  Administered 2020-04-04 (×2): 500 mg via ORAL
  Filled 2020-04-04 (×2): qty 2

## 2020-04-04 MED ORDER — SODIUM CHLORIDE 0.9 % IV SOLN
INTRAVENOUS | Status: DC | PRN
  Administered 2020-04-04: 250 mL via INTRAVENOUS
  Administered 2020-04-07 (×2): 500 mL via INTRAVENOUS

## 2020-04-04 MED ORDER — MAGNESIUM SULFATE 2 GM/50ML IV SOLN
2.0000 g | Freq: Once | INTRAVENOUS | Status: AC
  Administered 2020-04-04: 2 g via INTRAVENOUS
  Filled 2020-04-04: qty 50

## 2020-04-04 MED ORDER — HYDRALAZINE HCL 20 MG/ML IJ SOLN
10.0000 mg | Freq: Four times a day (QID) | INTRAMUSCULAR | Status: DC | PRN
  Administered 2020-04-04: 10 mg via INTRAVENOUS
  Filled 2020-04-04: qty 1

## 2020-04-04 NOTE — Progress Notes (Signed)
Palliative care progress note  Reason for visit: Goals of care in light of progressive lung cancer  Chart reviewed and discussed with bedside RN as well as respiratory therapy.  Mr. Douse is currently wearing BiPAP but was fighting and complaining and it was removed at time of my visit.  He reports that he cannot tolerate at this time and that his mouth is too dry and he needs to drink.  In talking with him, Mr. Mattioli continues to have periods where he is more lucid and periods where he is more confused.  I believe this is likely related to hypercarbia but at this point he is not wearing BiPAP long enough to completely correct the underlying problem.  I called and was able to reach his daughter, Sharl Ma.  She reports that she was up to see him earlier today and that he appeared to be feeling worse than yesterday.  I expressed my concern that he continues to have respiratory failure despite treatment with antibiotics and intermittent BiPAP and I am concerned as he is not wanting to wear BiPAP which would be the next step in working to try and correct his underlying respiratory failure.  Overall, I expressed concern that Mr. Noreen is voicing he does not want the interventions that are likely necessary if the care plan moving forward is for continued aggressive treatment.  I recommended we meet again tomorrow in order to further discuss goals moving forward.  At this point in time, I do not think that Mr. Lenoir will be able to make decisions without input from family as well.  -At this point, remains full code/full scope treatment.  He has been refusing to wear BiPAP as recommended at times. -Plan for another family meeting tomorrow morning (10 AM or so) to further discuss goals of care.  My recommendation is further consideration for plan for transition to more of a comfort/hospice approach as Mr. Dobler is not a candidate for further chemotherapy and is beginning to have increased in  tolerance for aggressive interventions (such as BiPAP).  Time in: 1530 Time out: 1610 Total time: 40 minutes  Greater than 50%  of this time was spent counseling and coordinating care related to the above assessment and plan.  Micheline Rough, MD [facility] [phone]

## 2020-04-04 NOTE — Progress Notes (Addendum)
Palliative care progress note  Reason for visit: Goals of care in light of progression of lung cancer  Chart reviewed including recommendation from Dr. Earlie Server for hospice services.  I met with Ryan Contreras and his family for a family meeting.  He had 3 of his sisters present as well as his daughter, Ryan Contreras.  Ryan Contreras had periods where he seemed to drift off and periods where he was more awake and alert during conversation.  I asked regarding his conversation with Dr. Earlie Server this morning, and he reports that he does not remember seeing him.  His sister, Ryan Contreras, is at the bedside and states that Dr. Earlie Server did call and speak with her this morning.  We reviewed Ryan Contreras clinical course with continued decline in his nutrition and functional status.  Discussed his postobstructive pneumonia with progression of his underlying lung cancer.  We talked about the role of disease modifying therapy and treatment of cancer and Dr. Worthy Flank evaluation that further chemotherapy is unlikely to be beneficial moving forward.  Ryan Contreras seems to understand this but states this he is he is surprised and upset to hear this information.  Family wanted to see imaging associated with most recent scans and we reviewed this together in detail.  Discussed prior chest x-rays from the summer in contrast to current x-ray with demonstration of upper and middle lobe collapse.  We also reviewed his most recent CT of the chest in comparison with prior imaging that showed disease progression with enlargement of primary tumor.  Family understands that this progression occurred despite intervention with chemotherapy/immunotherapy.  We discussed that the fact that further chemotherapy is not likely to be beneficial does not mean that there is "nothing to do" but rather it means that we need to focus on aggressive symptom management to make sure that he remains as functional as possible moving forward as aggressive symptom  management is the best way to likely add the most time in quality to his life at this point in time.  We also discussed that in light of his incurable illness, intervention should be focused on things that are likely to allow him to be out of the hospital and spending time with family as he states these are the most important things.  We discussed that heroic interventions in the event of cardiac or respiratory arrest are probably not likely to result in him ever being well enough to have meaningful time outside of the hospital.  Discussed that we will need to continue to consider what appropriate limitations of care may be moving forward.  He is going to consider this information remains a full code at this point in time.  We talked about options for discharge from the hospital including potential for trial of rehab versus consideration of election of his hospice benefits.  Family reports being appreciative of information, and at this point, Ryan Contreras seems to indicate that he wants to try rehab to see how much functional status he can regain.  Overall, I am still concerned how much Ryan Contreras is able to fully process the information we have been discussing.  He appears to be sleepy at times and I am concerned that he will reach a point where he needs to go back on BiPAP sometime this evening.  I discussed this with Dr. Shelton Silvas and he will order for BiPAP nightly if needed.  Plan to continue to follow this weekend and progress conversation based upon his clinical course and progression of acceptance  of the severity of his illness and the fact that he is not a candidate for further chemotherapy moving forward.  He is invested in plan to continue current antibiotics and treatment of this course of pneumonia.  We discussed that we will need to work to determine what next steps will be when he leaves the hospital.    Time in: 1615 Time out: 1715 Total time: 60 minutes  [place], MD [facility] Team [phone]

## 2020-04-04 NOTE — Progress Notes (Signed)
PROGRESS NOTE    Ryan Contreras  YJE:[id number] DOB: February 18, 1941 DOA: 04/01/2020 PCP: Cyndi Bender, PA-C   Brief Narrative: HPI per Dr. Theda Belfast on 04/01/20 Ryan Contreras is a 79 y.o. 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  Patient is a poor historian and does not provide accurate history.  I have reviewed patient's medical records and communicated with Dr. Julien Nordmann and Dr. Kyung Rudd as well.  Patient had his second cycle of current chemotherapy on 03/31/2020 and presented today to the ED with worsening shortness of breath.  At baseline, he wears 2 to 3 L of oxygen at home.  Patient complains of progressively worsening shortness of breath even with minimal exertion.  He denies fevers or worsening cough.  No chest pain.  Denies nausea, vomiting, diarrhea, abdominal pain, dysuria.  He does report decreased p.o. intake.  No known Covid exposures.  ED Course: He required 4 to 5 L oxygen via nasal cannula in the ED; his oxygen saturation was found to be 81% by EMS.  Chest x-ray showed possible worsening of mass.  CT of the chest was negative for PE but showed worsening of his cancer along with worsening of right upper lobe collapse and new right middle lobe collapse.  ED provider updated Dr. Julien Nordmann.  Hospitalist service was called to evaluate the patient.  **Interim History Has been intermittently using BiPAP given his somnolence and hypercarbia but is becoming agitated wearing it.  SLP evaluated and recommending dysphagia 3 diet.  PT OT recommending SNF.  Pulmonary and medical oncology evaluated recommending IV antibiotics and steroids.   Palliative care medicine was consulted for goals of care discussion and will continue full scope of treatment currently while we continue palliative care discussions and the plan is for another family care discussion in the a.m.  Oncology reportedly stated the patient is no longer a candidate for hemotherapy.  Family still wants aggressive measures and continued scope of treatment.  Patient continues to be agitated and now requires a sitter and has been given Haldol.  Blood count this morning was spuriously low but was repeated and is okay.  Assessment & Plan:   Principal Problem:   Acute on chronic respiratory failure (HCC) Active Problems:   Hyperlipidemia   Gout   Essential hypertension   CAD, NATIVE VESSEL   COPD with acute exacerbation (HCC)   Primary malignant neoplasm of right lung metastatic to other site [facility])  Acute on Chronic hypoxic respiratory failure quiring noninvasive positive pressure ventilation with BiPAP intermittently COPD exacerbation Recurrent non-small cell lung cancer currently on chemotherapy causing right upper lobe and right middle lobe collapse/consolidation Probable postobstructive pneumonia -Patient normally wears 2 to 3 L oxygen by nasal cannula at home.  Currently requiring 4 to 5 L oxygen and then had to be placed on BiPAP and was weaned off of BiPAP yesterday morning; have added BiPAP nightly given the concern for recurrent hypercarbia -Had another ABG done this morning as he was somnolent and showed a pH of 7.275, PCO2 of 98.6, PO2 of 83.0, and a bicarb level of 44.3, and ABG 2 oh saturation of 94.9% -CT of the chest showing right upper lobe and right middle lobe collapse/consolidation  with possibility of postobstructive pneumonia -Patient was started on Solu-Medrol in the ED.  Will continue Solu-Medrol 60 mg IV every 8 hours and will go to every 12 today -Patient is extremely somnolent and appeared wheezy so we will give him a dose of IV Lasix as well    -Continue Trelegy.  Continue nebs.  Incentive spirometry/flutter valve -We will use broad-spectrum antibiotics cefepime/vancomycin for now.  Also will order blood cultures -Palliative Care consultation for goals of care discussion.  I have notified Dr. Mohamed/oncology about the patient: Patient requests evaluation by Dr. Julien Nordmann while inpatient; unclear if Dr. Earlie Server has seen the patient or not  but reportedly he had called the family yesterday and stated the patient is no longer a candidate for chemotherapy -Patient's family request to speak with Dr. Earlie Server in person as they have several oncology questions that they want answered; in the interim palliative care is continue to work on goals of care -Pulmonary was consulted regarding the patient who recommends continuing broad-spectrum antibiotics and steroids as well as continuing supplemental oxygen via nasal cannula, aggressive pulmonary hygiene and following intermittent chest x-ray and ABG and have signed off the case -SLP evaluation recommending a dysphagia 3 diet -Medical oncology recommends continuing IV antibiotics and Solu-Medrol and if the patient's status continues deteriorate there agreement of pursuing palliative care and hospice for this patient; apparently Dr. Earlie Server has recommended normal chemotherapy and family had several questions -Patient's CO2 is 40, anion gap is 9 -Because he had extravasation of contrast on the right upper extremity and he is undergoing the extravasation protocol -Continue to monitor respiratory status carefully and repeat chest x-ray in the a.m. -dietitian consulted and recommending boost breeze once a day and Ensure Enlive p.o. twice daily as well as a daily multivitamin -PT OT recommending skilled nursing facility -Palliative care was consulted for further goals of care treatment and they discussed with medical oncology today and the plan is to continue to follow his clinical course with treatment of  his pneumonia and continue full scope of treatment as well as continue conversation regarding long-term goals of care based on his clinical course for the duration of his admission; another family meeting is set up for tomorrow and will see if Dr. Earlie Server can come by Monday to discuss and answer the patient's family's questions and concerns -The patient is hopeful to pursue disease modifying therapy for his lung cancer but unsure if this is feasible as medical oncology has reportedly recommended normal chemotherapy to the patient's family -Patient's clinical status is tenuous and he continues to remain on intermittent BiPAP.  We will continue aggressive treatment as above and continue BiPAP if he tolerates but he likely continues to be agitated and so we have given him some Haldol -Also continues to have some dyspnea so we will start low-dose morphine IV 1 mg daily as needed for pain as well as dyspnea -repeat chest x-ray in a.m.  Leukocytosis -Probably from above is trending down now.  -WBC went from 22.2 and is now trended down to 12.5 -Continue to monitor and trend and repeat CBC in a.m.  Chronic Macrocytic Anemia -Patient's hemoglobin/hematocrit went from 11.7/40.5 and is now at 9.8/35.0 yesterday and today it is 10.2/33.7; this morning it had dropped down to 7.1/24.2 but this is likely a spurious drawl result -MCV was 102.4 -Check anemia panel in the a.m for the B12 and folate level -Continue monitor for signs and symptoms of bleeding; currently no overt bleeding on  -repeat  CBC in a.m.  Hypertension -Continue to Monitor BP carefully.   -Continue metoprolol for now.  -Eplerenone held for now. -Last BP was 147/73  Hyperkalemia -Mild date was 5.6 and improved to 4.9 this morning -Continue to Hold Potassium Supplementation -Continue to Monitor and Trend -Currently holding Eplerenone  -Because it remains elevated we will start Lokelma 10 mg p.o. daily and have stopped it  today -Repeat CMP in AM   History of Coronary Artery Disease -No chest pain.   -Continue ASA 81 mg po Daily, Clopidogrel 75 mg po Daily, Atrovastatin 80 mg po qHS as well as beta-blocker with Metoprolol Succinate 25 mg po Daily.  -Outpatient follow-up with cardiology if he improves enough to get discharged  Hyperlipidemia -Continue Atorvastatin 80 mg po qHS  Gout -Continue Allopurinol 100 mg po Daily   Generalized deconditioning -Overall prognosis is guarded to poor.   -Palliative care consultation and appreciate continued goals of care discussion and will be continuing palliative care and family discussions in the a.m. -PT evaluation done and they are recommending SNF so we will need to consult transition of care to assist with placement if the patient wants to pursue SNF -Family has several oncology questions and want to speak with Dr. Julien Nordmann directly in person and will notify him on Monday to come speak with the family  DVT prophylaxis: Enoxaparin 40 mg sq q24h Code Status: FULL CODE  Family Communication: No family present at bedside  Disposition Plan: Patient is from home now requiring SNF level of care and will need to have improvement of his respiratory status prior to safe discharge  Status is: Inpatient  Remains inpatient appropriate because:Inpatient level of care appropriate due to severity of illness   Dispo: The patient is from: Home              Anticipated d/c is to: SNF              Anticipated d/c date is: 2 days              Patient currently is not medically stable to d/c.  Consultants:   Medical Oncology  Pulmonary Critical Care  Interventional Radiology    Procedures: None   Antimicrobials:  Anti-infectives (From admission, onward)   Start     Dose/Rate Route Frequency Ordered Stop   04/02/20 1800  vancomycin (VANCOREADY) IVPB 1750 mg/350 mL  Status:  Discontinued     1,750 mg 175 mL/hr over 120 Minutes Intravenous Every 24 hours 04/01/20  1125 04/02/20 1731   04/02/20 1000  azithromycin (ZITHROMAX) tablet 500 mg  Status:  Discontinued     500 mg Oral Daily 04/01/20 1040 04/01/20 1103   04/01/20 1200  vancomycin (VANCOREADY) IVPB 2000 mg/400 mL     2,000 mg 200 mL/hr over 120 Minutes Intravenous  Once 04/01/20 1118 04/01/20 2004   04/01/20 1200  ceFEPIme (MAXIPIME) 2 g in sodium chloride 0.9 % 100 mL IVPB     2 g 200 mL/hr over 30 Minutes Intravenous Every 8 hours 04/01/20 1118     04/01/20 1100  azithromycin (ZITHROMAX) 500 mg in sodium chloride 0.9 % 250 mL IVPB  Status:  Discontinued     500 mg 250 mL/hr over 60 Minutes Intravenous Every 24 hours 04/01/20 1040 04/01/20 1103     Subjective: Seen and examined at bedside and he was extremely somnolent when I saw him this morning.  He was a little bit difficult to arouse and he was hypercarbic so it  ended up placing him back on BiPAP.  When he did wake up he would answer me very briefly and falls back to sleep.  Nursing states that he has been more agitated and that the Haldol that was initiated did not work.  Patient continues to scream "help me, help me" while he was on the BiPAP.  BiPAP was been weaned off and he was given some morphine for his pain.  Family has several questions and request to speak with Dr. Julien Nordmann directly and will get in touch with him likely Monday to answer questions or concerns but in the meantime we will continue goals of care discussion with palliative care.  Objective: Vitals:   04/04/20 1400 04/04/20 1457 04/04/20 1700 04/04/20 1822  BP: (!) 175/89  (!) 147/73   Pulse: (!) 111 (!) 109 (!) 110   Resp: 20 (!) 22 (!) 24   Temp:    97.8 F (36.6 C)  TempSrc:    Axillary  SpO2: 100% 99% 99%   Weight:      Height:        Intake/Output Summary (Last 24 hours) at 04/04/2020 1856 Last data filed at 04/04/2020 1700 Gross per 24 hour  Intake 753.08 ml  Output 3525 ml  Net -2771.92 ml   Filed Weights   04/01/20 1638  Weight: 102 kg    Examination: Physical Exam:  Constitutional: The patient is an overweight chronically ill-appearing African-American male who was extremely somnolent this morning and a little bit difficult to arouse Eyes: Lids, lids and conjunctivae normal, sclerae anicteric  ENMT: External Ears, Nose appear normal.  Has extremely poor dentition Neck: Appears normal, supple, no cervical masses, normal ROM, no appreciable thyromegaly; no JVD Respiratory: Diminished to auscultation bilaterally with coarse breath sounds and rhonchi as well as crackles and some wheezing.  Wearing supplemental oxygen via nasal cannula and has a slightly increased respiratory rate Cardiovascular: Tachycardic rate but regular rhythm, and 1+ lower extremity edema  Abdomen: Soft, non-tender, distended secondary body habitus. Bowel sounds positive.  GU: Deferred. Musculoskeletal: No clubbing / cyanosis of digits/nails. No joint deformity upper and lower extremities.  Skin: No rashes, lesions, ulcers on limited skin evaluation. No induration; Warm and dry.  Neurologic: He is extremely somnolent and very difficult to arouse and follow commands  Psychiatric: Impaired judgment and insight.  Extremely somnolent and drowsy  Data Reviewed: I have personally reviewed following labs and imaging studies  CBC: Recent Labs  Lab 03/31/20 0900 03/31/20 0900 04/01/20 0552 04/02/20 0304 04/03/20 0301 04/04/20 0725 04/04/20 1017  WBC 11.8*  --  22.2* 18.3* 17.1* 9.8 12.5*  NEUTROABS 8.0*  --  20.4*  --  16.2* 9.2* 12.0*  HGB 11.0*  --  11.7* 9.8* 10.4* 7.1* 10.2*  HCT 37.8*   < > 40.5 35.0* 35.8* 24.2* 33.7*  MCV 102.4*   < > 104.7* 106.1* 105.3* 104.8* 102.4*  PLT 347  --  373 304 317 198 265   < > = values in this interval not displayed.   Basic Metabolic Panel: Recent Labs  Lab 04/01/20 0552 04/02/20 0304 04/03/20 0301 04/04/20 0725 04/04/20 1017  NA 140 137 137 141 137  K 5.2* 5.2* 5.6* 3.5 4.9  CL 92* 94* 92* 103 88*   CO2 38* 34* 40* 33* 40*  GLUCOSE 136* 159* 171* 116* 294*  BUN 16 28* 33* 23 32*  CREATININE 0.93 0.94 0.95 0.43* 0.81  CALCIUM 8.7* 7.7* 8.2* 6.3* 8.7*  MG  --   --  2.4 1.6*  --   PHOS  --   --  3.3 2.2*  --    GFR: Estimated Creatinine Clearance: 97.3 mL/min (by C-G formula based on SCr of 0.81 mg/dL). Liver Function Tests: Recent Labs  Lab 03/31/20 0900 04/02/20 0304 04/03/20 0301 04/04/20 0725  AST 23 25 26 19   ALT 20 21 23 17   ALKPHOS 111 80 78 49  BILITOT 0.3 0.3 0.3 0.4  PROT 7.7 6.6 7.3 4.8*  ALBUMIN 2.8* 2.6* 2.8* 2.0*   No results for input(s): LIPASE, AMYLASE in the last 168 hours. No results for input(s): AMMONIA in the last 168 hours. Coagulation Profile: No results for input(s): INR, PROTIME in the last 168 hours. Cardiac Enzymes: No results for input(s): CKTOTAL, CKMB, CKMBINDEX, TROPONINI in the last 168 hours. BNP (last 3 results) No results for input(s): PROBNP in the last 8760 hours. HbA1C: No results for input(s): HGBA1C in the last 72 hours. CBG: Recent Labs  Lab 04/01/20 1535  GLUCAP 113*   Lipid Profile: No results for input(s): CHOL, HDL, LDLCALC, TRIG, CHOLHDL, LDLDIRECT in the last 72 hours. Thyroid Function Tests: No results for input(s): TSH, T4TOTAL, FREET4, T3FREE, THYROIDAB in the last 72 hours. Anemia Panel: No results for input(s): VITAMINB12, FOLATE, FERRITIN, TIBC, IRON, RETICCTPCT in the last 72 hours. Sepsis Labs: Recent Labs  Lab 04/01/20 0552 04/02/20 0304  PROCALCITON  --  <0.10  LATICACIDVEN 0.9  --     Recent Results (from the past 240 hour(s))  SARS CORONAVIRUS 2 (TAT 6-24 HRS) Nasopharyngeal Nasopharyngeal Swab     Status: None   Collection Time: 04/01/20  6:03 AM   Specimen: Nasopharyngeal Swab  Result Value Ref Range Status   SARS Coronavirus 2 NEGATIVE NEGATIVE Final    Comment: (NOTE) SARS-CoV-2 target nucleic acids are NOT DETECTED. The SARS-CoV-2 RNA is generally detectable in upper and  lower respiratory specimens during the acute phase of infection. Negative results do not preclude SARS-CoV-2 infection, do not rule out co-infections with other pathogens, and should not be used as the sole basis for treatment or other patient management decisions. Negative results must be combined with clinical observations, patient history, and epidemiological information. The expected result is Negative. Fact Sheet for Patients: SugarRoll.be Fact Sheet for Healthcare Providers: https://www.woods-mathews.com/ This test is not yet approved or cleared by the [place] FDA and  has been authorized for detection and/or diagnosis of SARS-CoV-2 by FDA under an Emergency Use Authorization (EUA). This EUA will remain  in effect (meaning this test can be used) for the duration of the COVID-19 declaration under Section 56 4(b)(1) of the Act, 21 U.S.C. section 360bbb-3(b)(1), unless the authorization is terminated or revoked sooner. Performed at [facility], [place] [address] [place], [place] [address]   MRSA culture     Status: None   Collection Time: 04/01/20 11:49 AM   Specimen: Body Fluid  Result Value Ref Range Status   Specimen Description   Final    NASAL SWAB Performed at [facility] [address] [place], [place] [address]    Special Requests   Final    NONE Performed at [facility], [place] [address], [place], [place] [address]    Culture   Final    NO GROWTH Performed at [facility], [place] [address], [place], [place] [address]    Report Status 04/02/2020 FINAL  Final  Culture, blood (routine x 2)     Status: None (Preliminary  result)   Collection Time: 04/01/20 11:49 AM   Specimen: BLOOD  Result Value Ref Range Status   Specimen Description   Final    BLOOD PORTA CATH LT Performed at [facility] [address] [place], [place] [address]    Special  Requests   Final    BOTTLES DRAWN AEROBIC AND ANAEROBIC Blood Culture adequate volume Performed at [facility] [address], [place], [place] [address]    Culture   Final    NO GROWTH 3 DAYS Performed at [facility], [place] [address] [place], [place] [address]    Report Status PENDING  Incomplete  Culture, blood (routine x 2)     Status: None (Preliminary result)   Collection Time: 04/01/20 11:49 AM   Specimen: BLOOD  Result Value Ref Range Status   Specimen Description   Final    BLOOD PORTA CATH LT Performed at [facility] [address] [place], [place] [address]    Special Requests   Final    BOTTLES DRAWN AEROBIC AND ANAEROBIC Blood Culture adequate volume Performed at [facility] [address], [place], [place] [address]    Culture   Final    NO GROWTH 3 DAYS Performed at [facility], [place] [address] [place], [place] [address]    Report Status PENDING  Incomplete  MRSA PCR Screening     Status: None   Collection Time: 04/01/20  6:00 PM   Specimen: Nasopharyngeal  Result Value Ref Range Status   MRSA by PCR NEGATIVE NEGATIVE Final    Comment:        The GeneXpert MRSA Assay (FDA approved for NASAL specimens only), is one component of a comprehensive MRSA colonization surveillance program. It is not intended to diagnose MRSA infection nor to guide or monitor treatment for MRSA infections. Performed at [facility], [place] [address], [place], [place] [address]      RN Pressure Injury Documentation:     Estimated body mass index is 29.67 kg/m as calculated from the following:   Height as of this encounter: 6\' 1"  (1.854 m).   Weight as of this encounter: 102 kg.  Malnutrition Type:  Nutrition Problem: Increased nutrient needs Etiology: acute illness, chronic illness, cancer and cancer related treatments   Malnutrition Characteristics:  Signs/Symptoms:  estimated needs   Nutrition Interventions:  Interventions: Ensure Enlive (each supplement provides 350kcal and 20 grams of protein), MVI, Boost Breeze Radiology Studies: DG CHEST PORT 1 VIEW  Result Date: 04/04/2020 CLINICAL DATA:  79 year old male with history of shortness of breath. EXAM: PORTABLE CHEST 1 VIEW COMPARISON:  Chest x-ray 04/03/2020. FINDINGS: Left-sided single-lumen internal jugular power porta cath with tip terminating at the superior cavoatrial junction. Known right upper lobe mass and associated tumor infiltration and atelectasis in the right upper lobe and right middle lobe appears grossly similar to the prior study resulting in a focal opacity projecting over the right mid lung. Improved aeration in the right lung base when compared to the prior study, suggesting resolving areas of airspace consolidation and/or atelectasis in the right lower lobe. Left lung is clear. No pleural effusions. No evidence of pulmonary edema. Heart size is mildly enlarged. IMPRESSION: 1. Improving aeration in the right lower lobe which likely reflects resolving atelectasis and/or consolidation. Otherwise, the radiographic appearance the chest is very similar to the prior study, as above. Electronically Signed   By: Mauri Brooklyn.D.  On: 04/04/2020 06:46   DG CHEST PORT 1 VIEW  Result Date: 04/03/2020 CLINICAL DATA:  Shortness of breath. EXAM: PORTABLE CHEST 1 VIEW COMPARISON:  04/01/2020 chest x-ray and CT scan. FINDINGS: The left IJ power port is stable. The cardiac silhouette, mediastinal and hilar contours are unchanged. Stable right upper lobe lung mass and surrounding radiation fibrosis. Overall slight worsening right lung aeration which could be due to surrounding pneumonitis or progressive atelectasis. The left lung remains clear. No pneumothorax. IMPRESSION: 1. Stable right upper lobe lung mass and surrounding radiation fibrosis. 2. Slight worsening right lung aeration possibly due to  surrounding pneumonitis or progressive atelectasis. Electronically Signed   By: Marijo Sanes M.D.   On: 04/03/2020 07:06   Scheduled Meds: . allopurinol  100 mg Oral Daily  . aspirin EC  81 mg Oral QHS  . atorvastatin  80 mg Oral QHS  . brimonidine  1 drop Both Eyes BID  . budesonide (PULMICORT) nebulizer solution  0.25 mg Nebulization BID  . chlorhexidine  15 mL Mouth Rinse BID  . Chlorhexidine Gluconate Cloth  6 each Topical Daily  . clopidogrel  75 mg Oral Daily  . docusate sodium  100 mg Oral Daily  . enoxaparin (LOVENOX) injection  40 mg Subcutaneous Q24H  . feeding supplement  1 Container Oral Q24H  . feeding supplement (ENSURE ENLIVE)  237 mL Oral BID BM  . ipratropium-albuterol  3 mL Nebulization BID  . mouth rinse  15 mL Mouth Rinse q12n4p  . methylPREDNISolone (SOLU-MEDROL) injection  60 mg Intravenous Q8H  . metoprolol succinate  25 mg Oral Daily  . multivitamin with minerals  1 tablet Oral Daily  . pantoprazole  40 mg Oral Daily  . phosphorus  500 mg Oral BID  . sodium chloride flush  10-40 mL Intracatheter Q12H  . tamsulosin  0.4 mg Oral Daily   Continuous Infusions: . ceFEPime (MAXIPIME) IV Stopped (04/04/20 1504)    LOS: 3 days   Kerney Elbe, DO [facility] PAGER is on [place]  If 7PM-7AM, please contact night-coverage [email or web address]

## 2020-04-04 NOTE — Progress Notes (Signed)
RT placed patient on Bipap via [place]. Patient pulled off mask and did not want to wear it. RT will continue to monitor

## 2020-04-04 NOTE — Progress Notes (Signed)
RT planned to obtain ABG after pt being on bi-pap for 1 hour. RN had to do pt care and removed pt from bi-pap. Pt is currently awake enough to speak to clinical staff. Unable to place pt back on bi-pap at this time due to oral medication intake, RT will continue to monitor pt for any signs of increased PaCO2 and need for Bi-PAP.

## 2020-04-05 ENCOUNTER — Inpatient Hospital Stay (HOSPITAL_COMMUNITY): Payer: Medicare PPO

## 2020-04-05 DIAGNOSIS — I2510 Atherosclerotic heart disease of native coronary artery without angina pectoris: Secondary | ICD-10-CM | POA: Diagnosis not present

## 2020-04-05 DIAGNOSIS — Z7189 Other specified counseling: Secondary | ICD-10-CM | POA: Diagnosis not present

## 2020-04-05 DIAGNOSIS — C3491 Malignant neoplasm of unspecified part of right bronchus or lung: Secondary | ICD-10-CM | POA: Diagnosis not present

## 2020-04-05 DIAGNOSIS — J9621 Acute and chronic respiratory failure with hypoxia: Secondary | ICD-10-CM | POA: Diagnosis not present

## 2020-04-05 DIAGNOSIS — E873 Alkalosis: Secondary | ICD-10-CM

## 2020-04-05 DIAGNOSIS — J441 Chronic obstructive pulmonary disease with (acute) exacerbation: Secondary | ICD-10-CM | POA: Diagnosis not present

## 2020-04-05 LAB — BLOOD GAS, ARTERIAL
Acid-Base Excess: 17.8 mmol/L — ABNORMAL HIGH (ref 0.0–2.0)
Bicarbonate: 46.7 mmol/L — ABNORMAL HIGH (ref 20.0–28.0)
FIO2: 40
O2 Saturation: 97.6 %
Patient temperature: 98.6
pCO2 arterial: 91.6 mmHg (ref 32.0–48.0)
pH, Arterial: 7.328 — ABNORMAL LOW (ref 7.350–7.450)
pO2, Arterial: 123 mmHg — ABNORMAL HIGH (ref 83.0–108.0)

## 2020-04-05 LAB — COMPREHENSIVE METABOLIC PANEL
ALT: 25 U/L (ref 0–44)
AST: 28 U/L (ref 15–41)
Albumin: 2.6 g/dL — ABNORMAL LOW (ref 3.5–5.0)
Alkaline Phosphatase: 62 U/L (ref 38–126)
Anion gap: 9 (ref 5–15)
BUN: 34 mg/dL — ABNORMAL HIGH (ref 8–23)
CO2: 46 mmol/L — ABNORMAL HIGH (ref 22–32)
Calcium: 8.6 mg/dL — ABNORMAL LOW (ref 8.9–10.3)
Chloride: 84 mmol/L — ABNORMAL LOW (ref 98–111)
Creatinine, Ser: 0.75 mg/dL (ref 0.61–1.24)
GFR calc Af Amer: >60 mL/min (ref >60)
GFR calc non Af Amer: >60 mL/min (ref >60)
Glucose, Bld: 128 mg/dL — ABNORMAL HIGH (ref 70–99)
Potassium: 4.2 mmol/L (ref 3.5–5.1)
Sodium: 139 mmol/L (ref 135–145)
Total Bilirubin: 0.7 mg/dL (ref 0.3–1.2)
Total Protein: 6.5 g/dL (ref 6.5–8.1)

## 2020-04-05 LAB — CBC WITH DIFFERENTIAL/PLATELET
Abs Immature Granulocytes: 0.08 10*3/uL — ABNORMAL HIGH (ref 0.00–0.07)
Basophils Absolute: 0 10*3/uL (ref 0.0–0.1)
Basophils Relative: 0 %
Eosinophils Absolute: 0 10*3/uL (ref 0.0–0.5)
Eosinophils Relative: 0 %
HCT: 31.1 % — ABNORMAL LOW (ref 39.0–52.0)
Hemoglobin: 9.4 g/dL — ABNORMAL LOW (ref 13.0–17.0)
Immature Granulocytes: 1 %
Lymphocytes Relative: 6 %
Lymphs Abs: 0.6 10*3/uL — ABNORMAL LOW (ref 0.7–4.0)
MCH: 29.8 pg (ref 26.0–34.0)
MCHC: 30.2 g/dL (ref 30.0–36.0)
MCV: 98.7 fL (ref 80.0–100.0)
Monocytes Absolute: 0.1 10*3/uL (ref 0.1–1.0)
Monocytes Relative: 2 %
Neutro Abs: 8.6 10*3/uL — ABNORMAL HIGH (ref 1.7–7.7)
Neutrophils Relative %: 91 %
Platelets: 232 10*3/uL (ref 150–400)
RBC: 3.15 MIL/uL — ABNORMAL LOW (ref 4.22–5.81)
RDW: 15.2 % (ref 11.5–15.5)
WBC: 9.4 10*3/uL (ref 4.0–10.5)
nRBC: 0 % (ref 0.0–0.2)

## 2020-04-05 LAB — MAGNESIUM: Magnesium: 2.1 mg/dL (ref 1.7–2.4)

## 2020-04-05 LAB — PHOSPHORUS: Phosphorus: 3.9 mg/dL (ref 2.5–4.6)

## 2020-04-05 MED ORDER — ARFORMOTEROL TARTRATE 15 MCG/2ML IN NEBU
15.0000 ug | INHALATION_SOLUTION | Freq: Two times a day (BID) | RESPIRATORY_TRACT | Status: DC
  Administered 2020-04-05 – 2020-04-09 (×8): 15 ug via RESPIRATORY_TRACT
  Filled 2020-04-05 (×8): qty 2

## 2020-04-05 MED ORDER — LEVALBUTEROL HCL 0.63 MG/3ML IN NEBU
0.6300 mg | INHALATION_SOLUTION | Freq: Four times a day (QID) | RESPIRATORY_TRACT | Status: DC
  Administered 2020-04-05 – 2020-04-06 (×6): 0.63 mg via RESPIRATORY_TRACT
  Filled 2020-04-05 (×6): qty 3

## 2020-04-05 MED ORDER — IPRATROPIUM BROMIDE 0.02 % IN SOLN
0.5000 mg | Freq: Four times a day (QID) | RESPIRATORY_TRACT | Status: DC
  Administered 2020-04-05 – 2020-04-06 (×6): 0.5 mg via RESPIRATORY_TRACT
  Filled 2020-04-05 (×6): qty 2.5

## 2020-04-05 MED ORDER — METHYLPREDNISOLONE SODIUM SUCC 40 MG IJ SOLR
40.0000 mg | Freq: Two times a day (BID) | INTRAMUSCULAR | Status: DC
  Administered 2020-04-06 (×2): 40 mg via INTRAVENOUS
  Filled 2020-04-05 (×2): qty 1

## 2020-04-05 MED ORDER — LORAZEPAM 2 MG/ML IJ SOLN
1.0000 mg | INTRAMUSCULAR | Status: DC | PRN
  Administered 2020-04-05 – 2020-04-09 (×10): 1 mg via INTRAVENOUS
  Filled 2020-04-05 (×10): qty 1

## 2020-04-05 MED ORDER — HYDROXYZINE HCL 25 MG PO TABS
25.0000 mg | ORAL_TABLET | Freq: Three times a day (TID) | ORAL | Status: DC | PRN
  Administered 2020-04-05 – 2020-04-09 (×5): 25 mg via ORAL
  Filled 2020-04-05 (×5): qty 1

## 2020-04-05 MED ORDER — ACETAZOLAMIDE 250 MG PO TABS
500.0000 mg | ORAL_TABLET | Freq: Every day | ORAL | Status: AC
  Administered 2020-04-05 – 2020-04-07 (×3): 500 mg via ORAL
  Filled 2020-04-05 (×3): qty 2

## 2020-04-05 MED ORDER — FUROSEMIDE 10 MG/ML IJ SOLN: 40.0000 mg | Freq: Once | INTRAMUSCULAR | Status: DC

## 2020-04-05 NOTE — Progress Notes (Signed)
Palliative care progress note  Reason for visit: Goals of care in light of progressive lung cancer  Chart reviewed and discussed with bedside RN as well as respiratory therapy.    I met today for family meeting with Ryan Contreras sisters, Ryan Contreras and Ryan Contreras, his (step)daughter Film/video editor, and his daughter Ryan Contreras (via phone).  I talked with family about [name] and my concern that he has incurable illness that has been further complicated by postobstructive pneumonia with continued episodes of hypercapnia and confusion.  Discussed concern that this may not be reversible illness and he appears to me to be continuing to progress in his confusion and inability to really participate in conversation.  We reviewed prior conversations regarding long-term prognosis with his lung cancer and recommendation from Dr. Julien Nordmann that he is unlikely to benefit chemotherapy and recommendation for hospice support.  Throughout the day yesterday and overnight, [name] intermittently would wear BiPAP but does not find it comfortable and continually tries to request stopping or removing it.  He is often very anxious when others are not in the room with him which exacerbates this problem.  Unfortunately, Ryan Contreras continues to have increased periods where he is more confused.  Based upon my conversations with him earlier today and at the time of this encounter, I do not think he is able to make his own medical decisions at this point in time.  I reviewed with family regarding my concern about care plan moving forward.  His family reports that he continues to ask for Dr. Julien Nordmann to come and see him.  Both I him family have explained to him that Dr. Julien Nordmann had evaluated him on Friday with recommendation for discontinuation of chemotherapy and election of his hospice benefits.  He is adamant that he did not speak with him on Friday and continues to request to do so.  His family does not feel that they can make any other  decisions regarding care plan until he has had the opportunity to see Dr. Julien Nordmann again.  We had a very long conversation regarding heroic interventions in light of his worsening condition and underlying cancer which cannot be cured.  My recommendation was consideration for continuation of current interventions at this is family desire, but also limitation of care of no mechanical ventilation or CPR in the event of cardiac or respiratory arrest.  Family expressed understanding this recommendation and also was able to verbalize that they would not want to see [name] undergo resuscitation.  At the same time, they feel as though they need for him to have the opportunity to discuss with Dr. Julien Nordmann prior to making change in his CODE STATUS.  -At this point, remains full code/full scope treatment.  He has been refusing to wear BiPAP as recommended at times. -I reviewed case again with Dr. Theone Murdoch.  He will touch base with Dr. Julien Nordmann tomorrow regarding family request for him to see [name] again.  Family expressed they are not going to change care plan until this meeting occurs.  I discussed with them that once Dr. Julien Nordmann has discussed again with [name], my strong recommendations for limitation of care of DNR/DNI as well as potential consideration for hospice based upon his continued clinical course.  Time in: 1110 Time out: 1205 Total time: 55 minutes  Greater than 50%  of this time was spent counseling and coordinating care related to the above assessment and plan.  Micheline Rough, MD [facility] [phone]

## 2020-04-05 NOTE — Progress Notes (Signed)
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  Patient reports he had chemotherapy 03/31/20. At baseline he is on 2 L . Required BiPAP. now on 4 L 04/02/20    PT Comments    Pt alert, in good spirits and eager to move.  Pt with continued jerking of limbs noted (UE greater than LE) but decreased from previous session.  Pt with marked improvement in balance and activity tolerance - pt able to maintain balance at EOB with Sup only, to march in place with RW and to ambulate short distance in room.   Follow Up Recommendations  SNF;Supervision/Assistance - 24 hour     Equipment Recommendations  None recommended by PT    Recommendations for Other Services       Precautions / Restrictions Precautions Precautions: Fall Precaution Comments: having jerkin movements of extremiies and trunk Restrictions Weight Bearing Restrictions: No    Mobility  Bed Mobility Overal bed mobility: Needs Assistance Bed Mobility: Supine to Sit;Sit to Supine     Supine to sit: Min assist Sit to supine: Min assist   General bed mobility comments: Cues for sequence with min assist to manage trunk OOB and LEs into bed  Transfers Overall transfer level: Needs assistance Equipment used: Rolling walker (2 wheeled) Transfers: Sit to/from Stand Sit to Stand: Min assist;+2 physical assistance;+2 safety/equipment;From elevated surface         General transfer comment: cues for use of UEs to self assist; min assist x 2 to bring wt up and fwd and to balance in standing  Ambulation/Gait Ambulation/Gait assistance: Min assist;+2 physical assistance;+2 safety/equipment Gait Distance (Feet): 16 Feet Assistive device:  Rolling walker (2 wheeled) Gait Pattern/deviations: Step-through pattern;Decreased step length - right;Decreased step length - left;Shuffle;Trunk flexed Gait velocity: decr   General Gait Details: cues for posture and position from RW; Pt shaky and with instability in all directions with noted jerking UEs and to lesser extent LEs   Stairs             Wheelchair Mobility    Modified Rankin (Stroke Patients Only)       Balance Overall balance assessment: Needs assistance Sitting-balance support: Bilateral upper extremity supported;Feet supported Sitting balance-Leahy Scale: Fair Sitting balance - Comments: Intermittent UE jerking in sitting but no overt LOB noted   Standing balance support: Bilateral upper extremity supported Standing balance-Leahy Scale: Poor                              Cognition Arousal/Alertness: Awake/alert Behavior During Therapy: WFL for tasks assessed/performed Overall Cognitive Status: No family/caregiver present to determine baseline cognitive functioning                         Following Commands: Follows one step commands consistently;Follows one step commands with increased time Safety/Judgement: Decreased awareness of safety   Problem Solving: Requires verbal cues;Requires tactile cues General Comments: patient oriented to place, time, knows hes weak but eager to move      Exercises      General Comments        Pertinent Vitals/Pain Pain Assessment: No/denies pain    Home Living  Prior Function            PT Goals (current goals can now be found in the care plan section) Acute Rehab PT Goals Patient Stated Goal: Walk PT Goal Formulation: With patient Time For Goal Achievement: 04/16/20 Potential to Achieve Goals: Fair Progress towards PT goals: Progressing toward goals    Frequency    Min 2X/week      PT Plan Current plan remains appropriate    Co-evaluation               AM-PAC PT "6 Clicks" Mobility   Outcome Measure  Help needed turning from your back to your side while in a flat bed without using bedrails?: A Little Help needed moving from lying on your back to sitting on the side of a flat bed without using bedrails?: A Little Help needed moving to and from a bed to a chair (including a wheelchair)?: A Little Help needed standing up from a chair using your arms (e.g., wheelchair or bedside chair)?: A Lot Help needed to walk in hospital room?: A Lot Help needed climbing 3-5 steps with a railing? : A Lot 6 Click Score: 15    End of Session Equipment Utilized During Treatment: Gait belt;Oxygen Activity Tolerance: Patient tolerated treatment well;Patient limited by fatigue Patient left: in bed;with call bell/phone within reach;with bed alarm set;with nursing/sitter in room Nurse Communication: Mobility status PT Visit Diagnosis: Unsteadiness on feet (R26.81);Difficulty in walking, not elsewhere classified (R26.2)     Time: 5188-4166 PT Time Calculation (min) (ACUTE ONLY): 26 min  Charges:  $Gait Training: 8-22 mins $Therapeutic Activity: 8-22 mins                     Debe Coder PT Acute Rehabilitation Services Pager [phone] Office [phone]    Daylin Eads 04/05/2020, 10:53 AM

## 2020-04-05 NOTE — Progress Notes (Signed)
Pt keeps taking off his bipap.  He does not want to be left alone in the room.  He is alert and oriented, but restless.  "I cannot get in the right spot."   RN assured him that I would stay with him in the room until he fell asleep and give him some ativan to help him relax.  He said thank you.  Ativan helped him rest for a few minutes then he began pulling mask and condom cath off again.  Pt still aware of where he is, who he is, situation.  Pt repositioned and new condom cath on.  Pt is quiet and resting.

## 2020-04-05 NOTE — Progress Notes (Signed)
Pt kept bipap on from 2300-0200 and 0300-0600.  He continually tried to pull it off.  After frequent repositioning and reassurance patient would rest for short periods.  The prn ativan dose appeared to be helpful for first part of night.

## 2020-04-05 NOTE — Progress Notes (Signed)
PROGRESS NOTE    Ryan Contreras  WUJ:[id number] DOB: 1941-02-20 DOA: 04/01/2020 PCP: Cyndi Bender, PA-C   Brief Narrative: HPI per Dr. Theda Belfast on 04/01/20 Ryan Contreras is a 79 y.o. 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  Patient is a poor historian and does not provide accurate history.  I have reviewed patient's medical records and communicated with Dr. Julien Nordmann and Dr. Kyung Rudd as well.  Patient had his second cycle of current chemotherapy on 03/31/2020 and presented today to the ED with worsening shortness of breath.  At baseline, he wears 2 to 3 L of oxygen at home.  Patient complains of progressively worsening shortness of breath even with minimal exertion.  He denies fevers or worsening cough.  No chest pain.  Denies nausea, vomiting, diarrhea, abdominal pain, dysuria.  He does report decreased p.o. intake.  No known Covid exposures.  ED Course: He required 4 to 5 L oxygen via nasal cannula in the ED; his oxygen saturation was found to be 81% by EMS.  Chest x-ray showed possible worsening of mass.  CT of the chest was negative for PE but showed worsening of his cancer along with worsening of right upper lobe collapse and new right middle lobe collapse.  ED provider updated Dr. Julien Nordmann.  Hospitalist service was called to evaluate the patient.  **Interim History Has been intermittently using BiPAP given his somnolence and hypercarbia but is becoming agitated wearing it.  SLP evaluated and recommending dysphagia 3 diet.  PT OT recommending SNF.  Pulmonary and medical oncology evaluated recommending IV antibiotics and steroids asnd we  have been weaning steroids.  Palliative care medicine was consulted for goals of care discussion and will continue full scope of treatment currently while we continue palliative care discussions and the plan is for another family care discussion in the a.m.  Oncology reportedly stated the patient is no longer a candidate for chemotherapy.  Family still wants aggressive measures and continued scope of treatment for now but want further discuss the patient's case with Dr. Julien Nordmann.  Patient continues to be agitated and now requires a sitter and has been given Haldol but this is likely in part to his steroids.  Blood count yesterday morning was spuriously low but was repeated and is okay.  He continues to intermittently wear the BiPAP and has been little bit more tolerant.  Has been started on hydroxyzine and Ativan.  Continues to have hypercapnia.  Her with a Actuary.  Also started getting alkalosis so we have started Diamox.  Assessment & Plan:   Principal Problem:   Acute on chronic respiratory failure (HCC) Active Problems:   Hyperlipidemia   Gout   Essential hypertension   CAD, NATIVE VESSEL   COPD with acute exacerbation (HCC)   Primary malignant neoplasm of right lung metastatic to other site [facility])  Acute on Chronic hypoxic respiratory failure quiring noninvasive positive pressure ventilation with BiPAP intermittently COPD exacerbation Recurrent non-small cell lung cancer currently on chemotherapy causing right upper lobe and right middle lobe collapse/consolidation Probable postobstructive pneumonia -Patient normally wears 2 to 3 L oxygen by nasal cannula at home.  Currently requiring 4 to 5 L oxygen and then had to  be placed on BiPAP and has intermittently when wearing it -Had another ABG done this afternoon as nursing stated he wa more confused and somnolent and showed a pH of 7.328, PCO2 of 91.6, PO2 of 123, bicarbonate level 46.7, ABG O2 saturation 97.6-CT of the chest showing right upper  lobe and right middle lobe collapse/consolidation with possibility of postobstructive pneumonia -Patient was started on Solu-Medrol in the ED.  Will continue Solu-Medrol 60 mg IV every 8 hours and went to every 12 hours yesterday and will decrease to 40 mg every 12 and hopefully transition to 40 mg daily and then to p.o. -Patient is extremely somnolent and appeared wheezy so we will give him a dose of IV Lasix as well  -Continue Trelegy.  Continue nebs.  Incentive spirometry/flutter valve -We will use broad-spectrum antibiotics cefepime/vancomycin for now.  Also will order blood cultures -Palliative Care consultation for goals of care discussion.  I have notified Dr. Mohamed/oncology about the patient: Patient requests evaluation by Dr. Julien Nordmann while inpatient; unclear if Dr. Earlie Server has seen the patient or not  but reportedly he had called the family the day before yesterday and stated the patient is no longer a candidate for chemotherapy -Patient's family again request to speak with Dr. Earlie Server in person as they have several oncology questions that they want answered; in the interim palliative care is continue to work on goals of care and they do not want to make any decisions until they have officially talked to Dr. Earlie Server and gotten his input -Pulmonary was consulted regarding the patient who recommends continuing broad-spectrum antibiotics and steroids as well as continuing supplemental oxygen via nasal cannula, aggressive pulmonary hygiene and following intermittent chest x-ray and ABG and have signed off the case -SLP evaluation recommending a dysphagia 3 diet -Medical oncology recommends continuing IV antibiotics and Solu-Medrol and if the patient's status continues deteriorate there agreement of pursuing palliative care and hospice for this patient; apparently Dr. Earlie Server has recommended normal chemotherapy and family had several questions -Patient has gotten a little bit more alkalotic with  a CO2 of 46, anion gap is 9; we will start the patient on Diamox 500 mg p.o. daily -Because he had extravasation of contrast on the right upper extremity and he is undergoing the extravasation protocol -Continue to monitor respiratory status carefully and repeat chest x-ray in the a.m. -dietitian consulted and recommending boost breeze once a day and Ensure Enlive p.o. twice daily as well as a daily multivitamin -PT OT recommending skilled nursing facility -Palliative care was consulted for further goals of care treatment and they discussed with medical oncology today and the plan is to continue to follow his clinical course with treatment of his pneumonia and continue full scope of treatment as well as continue conversation regarding long-term goals of care based on his clinical course for the duration of his admission; another family meeting is set up for tomorrow and will see if Dr. Earlie Server can come by Monday to discuss and answer the patient's family's questions and concerns -The patient was hopeful to pursue disease modifying therapy for his lung cancer but unsure if this is feasible as medical oncology has reportedly recommended normal chemotherapy to the patient's family -Patient's clinical status is tenuous and he continues to remain on intermittent BiPAP.  We will continue aggressive treatment as above and continue BiPAP if he tolerates but he likely continues to be agitated and so we have given him some Haldol; the agitation is in part slightly secondary  to his steroids -Also continues to have some dyspnea so we will start low-dose morphine IV 1 mg daily as needed for pain as well as dyspnea; because he continues to be somewhat agitated we will also add some Ativan 1 mg every 3 hours as needed and also added hydroxyzine -Continue monitor respiratory status carefully and also continue repeat chest x-ray in a.m.  Leukocytosis -Probably from above is trending down now.  -WBC went from 22.2 and  is now trended down to 9.4 -Continue to monitor and trend and repeat CBC in a.m.  Chronic Macrocytic Anemia -Patient's hemoglobin/hematocrit went from 11.7/40.5 and is now at 9.4/31.1 -MCV was 98.7 -Check anemia panel in the a.m for the B12 and folate level -Continue monitor for signs and symptoms of bleeding; currently no overt bleeding on  -repeat CBC in a.m.  Hypertension -Continue to Monitor BP carefully.   -Continue metoprolol for now.  -Eplerenone held for now. -Last BP was 144/73  Hyperkalemia -Mild date was 5.6 and improved to 4.2 this morning -Continue to Hold Potassium Supplementation -Continue to Monitor and Trend -Currently holding Eplerenone  -Because it remains elevated we will start Lokelma 10 mg p.o. daily and have stopped it  Yesterday  -Repeat CMP in AM   History of Coronary Artery Disease -No chest pain.   -Continue ASA 81 mg po Daily, Clopidogrel 75 mg po Daily, Atrovastatin 80 mg po qHS as well as beta-blocker with Metoprolol Succinate 25 mg po Daily.  -Outpatient follow-up with cardiology if he improves enough to get discharged  Hyperlipidemia -Continue Atorvastatin 80 mg po qHS  Gout -Continue Allopurinol 100 mg po Daily   Generalized deconditioning -Overall prognosis is guarded to poor.   -Palliative care consultation and appreciate continued goals of care discussion and will be continuing palliative care and family discussions but family wants to Speak with Oncology  -PT evaluation done and they are recommending SNF so we will need to consult transition of care to assist with placement if the patient wants to pursue SNF -Family has several oncology questions and want to speak with Dr. Julien Nordmann directly in person and will notify him on Monday to hopefully come speak with the family  DVT prophylaxis: Enoxaparin 40 mg sq q24h Code Status: FULL CODE  Family Communication: No family present at bedside this AM  Disposition Plan: Patient is from  home now requiring SNF level of care and will need to have improvement of his respiratory status prior to safe discharge  Status is: Inpatient  Remains inpatient appropriate because:Inpatient level of care appropriate due to severity of illness  Dispo: The patient is from: Home              Anticipated d/c is to: SNF if this is decided by family               Anticipated d/c date is: 2 days              Patient currently is not medically stable to d/c.  Consultants:   Medical Oncology  Pulmonary Critical Care  Interventional Radiology    Procedures: None   Antimicrobials:  Anti-infectives (From admission, onward)   Start     Dose/Rate Route Frequency Ordered Stop   04/02/20 1800  vancomycin (VANCOREADY) IVPB 1750 mg/350 mL  Status:  Discontinued     1,750 mg 175 mL/hr over 120 Minutes Intravenous Every 24 hours 04/01/20 1125 04/02/20 1731   04/02/20 1000  azithromycin (ZITHROMAX) tablet 500 mg  Status:  Discontinued     500 mg Oral Daily 04/01/20 1040 04/01/20 1103   04/01/20 1200  vancomycin (VANCOREADY) IVPB 2000 mg/400 mL     2,000 mg 200 mL/hr over 120 Minutes Intravenous  Once 04/01/20 1118 04/01/20 2004   04/01/20 1200  ceFEPIme (MAXIPIME) 2 g in sodium chloride 0.9 % 100 mL IVPB     2 g 200 mL/hr over 30 Minutes Intravenous Every 8 hours 04/01/20 1118     04/01/20 1100  azithromycin (ZITHROMAX) 500 mg in sodium chloride 0.9 % 250 mL IVPB  Status:  Discontinued     500 mg 250 mL/hr over 60 Minutes Intravenous Every 24 hours 04/01/20 1040 04/01/20 1103     Subjective: Seen and examined at bedside he is more awake and alert this morning when I saw him.  He does not complain of any pain and his respiratory status was a little bit fast but he is doing okay.  About to work with therapy.  No nausea or vomiting.  Becomes anxious whenever he gets on the BiPAP per nursing but he is able to tolerate last night.  No other concerns or complaints at this  time.  Objective: Vitals:   04/05/20 1600 04/05/20 1630 04/05/20 1707 04/05/20 1945  BP: 127/65  (!) 144/73   Pulse: (!) 111  94   Resp: 18  16   Temp:  97.7 F (36.5 C)    TempSrc:  Axillary    SpO2: 100%  97% 100%  Weight:      Height:        Intake/Output Summary (Last 24 hours) at 04/05/2020 2001 Last data filed at 04/05/2020 1430 Gross per 24 hour  Intake 486.32 ml  Output 1200 ml  Net -713.68 ml   Filed Weights   04/01/20 1638  Weight: 102 kg   Examination: Physical Exam:  Constitutional: The patient is an overweight chronically ill-appearing African-American male who is in some slight respiratory distress but appears calmer this morning and more awake Eyes: Lids and conjunctivae normal, sclerae anicteric  ENMT: External Ears, Nose appear normal. Grossly normal hearing.  Has extremely poor dentition Neck: Appears normal, supple, no cervical masses, normal ROM, no appreciable thyromegaly; no JVD Respiratory: Diminished to auscultation bilaterally with coarse breath sounds and some rhonchi as well as crackles.  Has some expiratory wheezing but this is improving from yesterday.  Wearing supplemental oxygen via nasal cannula and has mildly increased respiratory effort.  Cardiovascular: Tachycardic rate but regular rhythm; has some mild lower extremity edema Abdomen: Soft, non-tender, distended secondary body habitus. Bowel sounds positive.  GU: Deferred. Musculoskeletal: No clubbing / cyanosis of digits/nails. No joint deformity upper and lower extremities.  Skin: No rashes, lesions, ulcers on limited skin evaluation. No induration; Warm and dry.  Neurologic: CN 2-12 grossly intact with no focal deficits. Romberg sign and cerebellar reflexes not assessed.  Psychiatric: Normal judgment and insight. Alert and oriented x 3. Normal mood and appropriate affect.   Data Reviewed: I have personally reviewed following labs and imaging studies  CBC: Recent Labs  Lab  04/01/20 0552 04/01/20 0552 04/02/20 0304 04/03/20 0301 04/04/20 0725 04/04/20 1017 04/05/20 0500  WBC 22.2*   < > 18.3* 17.1* 9.8 12.5* 9.4  NEUTROABS 20.4*  --   --  16.2* 9.2* 12.0* 8.6*  HGB 11.7*   < > 9.8* 10.4* 7.1* 10.2* 9.4*  HCT 40.5   < > 35.0* 35.8* 24.2* 33.7* 31.1*  MCV 104.7*   < > 106.1* 105.3*  104.8* 102.4* 98.7  PLT 373   < > 304 317 198 265 232   < > = values in this interval not displayed.   Basic Metabolic Panel: Recent Labs  Lab 04/02/20 0304 04/03/20 0301 04/04/20 0725 04/04/20 1017 04/05/20 0500  NA 137 137 141 137 139  K 5.2* 5.6* 3.5 4.9 4.2  CL 94* 92* 103 88* 84*  CO2 34* 40* 33* 40* 46*  GLUCOSE 159* 171* 116* 294* 128*  BUN 28* 33* 23 32* 34*  CREATININE 0.94 0.95 0.43* 0.81 0.75  CALCIUM 7.7* 8.2* 6.3* 8.7* 8.6*  MG  --  2.4 1.6*  --  2.1  PHOS  --  3.3 2.2*  --  3.9   GFR: Estimated Creatinine Clearance: 98.6 mL/min (by C-G formula based on SCr of 0.75 mg/dL). Liver Function Tests: Recent Labs  Lab 03/31/20 0900 04/02/20 0304 04/03/20 0301 04/04/20 0725 04/05/20 0500  AST 23 25 26 19 28   ALT 20 21 23 17 25   ALKPHOS 111 80 78 49 62  BILITOT 0.3 0.3 0.3 0.4 0.7  PROT 7.7 6.6 7.3 4.8* 6.5  ALBUMIN 2.8* 2.6* 2.8* 2.0* 2.6*   No results for input(s): LIPASE, AMYLASE in the last 168 hours. No results for input(s): AMMONIA in the last 168 hours. Coagulation Profile: No results for input(s): INR, PROTIME in the last 168 hours. Cardiac Enzymes: No results for input(s): CKTOTAL, CKMB, CKMBINDEX, TROPONINI in the last 168 hours. BNP (last 3 results) No results for input(s): PROBNP in the last 8760 hours. HbA1C: No results for input(s): HGBA1C in the last 72 hours. CBG: Recent Labs  Lab 04/01/20 1535  GLUCAP 113*   Lipid Profile: No results for input(s): CHOL, HDL, LDLCALC, TRIG, CHOLHDL, LDLDIRECT in the last 72 hours. Thyroid Function Tests: No results for input(s): TSH, T4TOTAL, FREET4, T3FREE, THYROIDAB in the last 72  hours. Anemia Panel: No results for input(s): VITAMINB12, FOLATE, FERRITIN, TIBC, IRON, RETICCTPCT in the last 72 hours. Sepsis Labs: Recent Labs  Lab 04/01/20 0552 04/02/20 0304  PROCALCITON  --  <0.10  LATICACIDVEN 0.9  --     Recent Results (from the past 240 hour(s))  SARS CORONAVIRUS 2 (TAT 6-24 HRS) Nasopharyngeal Nasopharyngeal Swab     Status: None   Collection Time: 04/01/20  6:03 AM   Specimen: Nasopharyngeal Swab  Result Value Ref Range Status   SARS Coronavirus 2 NEGATIVE NEGATIVE Final    Comment: (NOTE) SARS-CoV-2 target nucleic acids are NOT DETECTED. The SARS-CoV-2 RNA is generally detectable in upper and lower respiratory specimens during the acute phase of infection. Negative results do not preclude SARS-CoV-2 infection, do not rule out co-infections with other pathogens, and should not be used as the sole basis for treatment or other patient management decisions. Negative results must be combined with clinical observations, patient history, and epidemiological information. The expected result is Negative. Fact Sheet for Patients: SugarRoll.be Fact Sheet for Healthcare Providers: https://www.woods-mathews.com/ This test is not yet approved or cleared by the [place] FDA and  has been authorized for detection and/or diagnosis of SARS-CoV-2 by FDA under an Emergency Use Authorization (EUA). This EUA will remain  in effect (meaning this test can be used) for the duration of the COVID-19 declaration under Section 56 4(b)(1) of the Act, 21 U.S.C. section 360bbb-3(b)(1), unless the authorization is terminated or revoked sooner. Performed at [facility], [place] [address], [place], [place] [address]   MRSA culture     Status: None  Collection Time: 04/01/20 11:49 AM   Specimen: Body Fluid  Result Value Ref Range Status   Specimen Description   Final    NASAL SWAB Performed at [place] [address] [place], [place] [address]    Special Requests   Final    NONE Performed at [facility], [place] [address], [place], [place] [address]    Culture   Final    NO GROWTH Performed at [facility], [place] [address], [place], [place] [address]    Report Status 04/02/2020 FINAL  Final  Culture, blood (routine x 2)     Status: None (Preliminary result)   Collection Time: 04/01/20 11:49 AM   Specimen: BLOOD  Result Value Ref Range Status   Specimen Description   Final    BLOOD PORTA CATH LEFT Performed at [facility], [place] [address] [place], [place] [address]    Special Requests   Final    BOTTLES DRAWN AEROBIC AND ANAEROBIC Blood Culture adequate volume Performed at [place] [address], [place], [place] [address]    Culture   Final    NO GROWTH 4 DAYS Performed at [facility], [place] [address], [place], [place] [address]    Report Status PENDING  Incomplete  Culture, blood (routine x 2)     Status: None (Preliminary result)   Collection Time: 04/01/20 11:49 AM   Specimen: BLOOD  Result Value Ref Range Status   Specimen Description   Final    BLOOD PORTA CATH LEFT Performed at [facility], [place] [address] [place], [place] [address]    Special Requests   Final    BOTTLES DRAWN AEROBIC AND ANAEROBIC Blood Culture adequate volume Performed at [place] [address], [place], [place] [address]    Culture   Final    NO GROWTH 4 DAYS Performed at [facility], [place] [address], [place], [place] [address]    Report Status PENDING  Incomplete  MRSA PCR Screening     Status: None   Collection Time: 04/01/20  6:00 PM   Specimen: Nasopharyngeal  Result Value Ref Range Status   MRSA by PCR NEGATIVE NEGATIVE Final    Comment:        The GeneXpert MRSA Assay (FDA approved for NASAL specimens only), is one component of a comprehensive MRSA colonization surveillance  program. It is not intended to diagnose MRSA infection nor to guide or monitor treatment for MRSA infections. Performed at [facility], [place] [address] [place],  [address]      RN Pressure Injury Documentation:     Estimated body mass index is 29.67 kg/m as calculated from the following:   Height as of this encounter: 6\' 1"  (1.854 m).   Weight as of this encounter: 102 kg.  Malnutrition Type:  Nutrition Problem: Increased nutrient needs Etiology: acute illness, chronic illness, cancer and cancer related treatments   Malnutrition Characteristics:  Signs/Symptoms: estimated needs   Nutrition Interventions:  Interventions: Ensure Enlive (each supplement provides 350kcal and 20 grams of protein), MVI, Boost Breeze Radiology Studies: DG CHEST PORT 1 VIEW  Result Date: 04/05/2020 CLINICAL DATA:  Shortness of breath EXAM: PORTABLE CHEST 1 VIEW COMPARISON:  04/04/2020 FINDINGS: LEFT-sided Port-A-Cath remains in place terminating at caval to atrial junction. Cardiomediastinal contours are stable with extensive post treatment changes in the RIGHT chest and middle lobe collapse with similar appearance.  LEFT chest is clear. No sign of pleural effusion. No acute bone finding. IMPRESSION: 1. No acute cardiopulmonary abnormality. 2. Post treatment changes in the RIGHT chest with similar appearance to prior study. Essentially no change in the appearance of the chest compared to the prior exam. Electronically Signed   By: Zetta Bills M.D.   On: 04/05/2020 07:40   DG CHEST PORT 1 VIEW  Result Date: 04/04/2020 CLINICAL DATA:  79 year old male with history of shortness of breath. EXAM: PORTABLE CHEST 1 VIEW COMPARISON:  Chest x-ray 04/03/2020. FINDINGS: Left-sided single-lumen internal jugular power porta cath with tip terminating at the superior cavoatrial junction. Known right upper lobe mass and associated tumor infiltration and atelectasis in the right upper lobe  and right middle lobe appears grossly similar to the prior study resulting in a focal opacity projecting over the right mid lung. Improved aeration in the right lung base when compared to the prior study, suggesting resolving areas of airspace consolidation and/or atelectasis in the right lower lobe. Left lung is clear. No pleural effusions. No evidence of pulmonary edema. Heart size is mildly enlarged. IMPRESSION: 1. Improving aeration in the right lower lobe which likely reflects resolving atelectasis and/or consolidation. Otherwise, the radiographic appearance the chest is very similar to the prior study, as above. Electronically Signed   By: Vinnie Langton M.D.   On: 04/04/2020 06:46   Scheduled Meds: . acetaZOLAMIDE  500 mg Oral Daily  . allopurinol  100 mg Oral Daily  . arformoterol  15 mcg Nebulization BID  . aspirin EC  81 mg Oral QHS  . atorvastatin  80 mg Oral QHS  . brimonidine  1 drop Both Eyes BID  . budesonide (PULMICORT) nebulizer solution  0.25 mg Nebulization BID  . chlorhexidine  15 mL Mouth Rinse BID  . Chlorhexidine Gluconate Cloth  6 each Topical Daily  . clopidogrel  75 mg Oral Daily  . docusate sodium  100 mg Oral Daily  . enoxaparin (LOVENOX) injection  40 mg Subcutaneous Q24H  . feeding supplement  1 Container Oral Q24H  . feeding supplement (ENSURE ENLIVE)  237 mL Oral BID BM  . ipratropium  0.5 mg Nebulization Q6H  . levalbuterol  0.63 mg Nebulization Q6H  . mouth rinse  15 mL Mouth Rinse q12n4p  . [START ON 04/06/2020] methylPREDNISolone (SOLU-MEDROL) injection  40 mg Intravenous Q12H  . metoprolol succinate  25 mg Oral Daily  . multivitamin with minerals  1 tablet Oral Daily  . pantoprazole  40 mg Oral Daily  . sodium chloride flush  10-40 mL Intracatheter Q12H  . tamsulosin  0.4 mg Oral Daily   Continuous Infusions: . sodium chloride 10 mL/hr at 04/05/20 0100  . ceFEPime (MAXIPIME) IV Stopped (04/05/20 1739)    LOS: 4 days   Kerney Elbe,  DO [facility] PAGER is on [place]  If 7PM-7AM, please contact night-coverage [email or web address]

## 2020-04-05 NOTE — Progress Notes (Signed)
Pt took gown off, bipap off, monitor leads, woke up confused.  He is easily re directable and states his birthday and  where he is clearly.  Safety sitter to be at bedside.  All bed alarms continue to be active.  Will monitro closely

## 2020-04-06 ENCOUNTER — Inpatient Hospital Stay (HOSPITAL_COMMUNITY): Payer: Medicare PPO

## 2020-04-06 DIAGNOSIS — J9621 Acute and chronic respiratory failure with hypoxia: Secondary | ICD-10-CM | POA: Diagnosis not present

## 2020-04-06 DIAGNOSIS — I2510 Atherosclerotic heart disease of native coronary artery without angina pectoris: Secondary | ICD-10-CM | POA: Diagnosis not present

## 2020-04-06 DIAGNOSIS — J441 Chronic obstructive pulmonary disease with (acute) exacerbation: Secondary | ICD-10-CM | POA: Diagnosis not present

## 2020-04-06 DIAGNOSIS — C3491 Malignant neoplasm of unspecified part of right bronchus or lung: Secondary | ICD-10-CM | POA: Diagnosis not present

## 2020-04-06 LAB — CBC WITH DIFFERENTIAL/PLATELET
Abs Immature Granulocytes: 0.21 10*3/uL — ABNORMAL HIGH (ref 0.00–0.07)
Basophils Absolute: 0 10*3/uL (ref 0.0–0.1)
Basophils Relative: 1 %
Eosinophils Absolute: 0 10*3/uL (ref 0.0–0.5)
Eosinophils Relative: 0 %
HCT: 30.7 % — ABNORMAL LOW (ref 39.0–52.0)
Hemoglobin: 9.2 g/dL — ABNORMAL LOW (ref 13.0–17.0)
Immature Granulocytes: 3 %
Lymphocytes Relative: 12 %
Lymphs Abs: 0.8 10*3/uL (ref 0.7–4.0)
MCH: 30.5 pg (ref 26.0–34.0)
MCHC: 30 g/dL (ref 30.0–36.0)
MCV: 101.7 fL — ABNORMAL HIGH (ref 80.0–100.0)
Monocytes Absolute: 0.1 10*3/uL (ref 0.1–1.0)
Monocytes Relative: 1 %
Neutro Abs: 5.2 10*3/uL (ref 1.7–7.7)
Neutrophils Relative %: 83 %
Platelets: 191 10*3/uL (ref 150–400)
RBC: 3.02 MIL/uL — ABNORMAL LOW (ref 4.22–5.81)
RDW: 15.6 % — ABNORMAL HIGH (ref 11.5–15.5)
WBC: 6.3 10*3/uL (ref 4.0–10.5)
nRBC: 0 % (ref 0.0–0.2)

## 2020-04-06 LAB — COMPREHENSIVE METABOLIC PANEL
ALT: 33 U/L (ref 0–44)
AST: 31 U/L (ref 15–41)
Albumin: 2.5 g/dL — ABNORMAL LOW (ref 3.5–5.0)
Alkaline Phosphatase: 56 U/L (ref 38–126)
Anion gap: 5 (ref 5–15)
BUN: 35 mg/dL — ABNORMAL HIGH (ref 8–23)
CO2: 39 mmol/L — ABNORMAL HIGH (ref 22–32)
Calcium: 8.3 mg/dL — ABNORMAL LOW (ref 8.9–10.3)
Chloride: 92 mmol/L — ABNORMAL LOW (ref 98–111)
Creatinine, Ser: 0.61 mg/dL (ref 0.61–1.24)
GFR calc Af Amer: >60 mL/min (ref >60)
GFR calc non Af Amer: >60 mL/min (ref >60)
Glucose, Bld: 139 mg/dL — ABNORMAL HIGH (ref 70–99)
Potassium: 4.9 mmol/L (ref 3.5–5.1)
Sodium: 136 mmol/L (ref 135–145)
Total Bilirubin: 0.9 mg/dL (ref 0.3–1.2)
Total Protein: 6 g/dL — ABNORMAL LOW (ref 6.5–8.1)

## 2020-04-06 LAB — CULTURE, BLOOD (ROUTINE X 2)
Culture: NO GROWTH
Culture: NO GROWTH
Special Requests: ADEQUATE
Special Requests: ADEQUATE

## 2020-04-06 LAB — PHOSPHORUS: Phosphorus: 3.9 mg/dL (ref 2.5–4.6)

## 2020-04-06 LAB — MAGNESIUM: Magnesium: 2 mg/dL (ref 1.7–2.4)

## 2020-04-06 LAB — GLUCOSE, CAPILLARY: Glucose-Capillary: 139 mg/dL — ABNORMAL HIGH (ref 70–99)

## 2020-04-06 MED ORDER — SODIUM CHLORIDE 0.9 % IV BOLUS
500.0000 mL | Freq: Once | INTRAVENOUS | Status: AC
  Administered 2020-04-06: 500 mL via INTRAVENOUS

## 2020-04-06 MED ORDER — PHENYLEPHRINE HCL-NACL 10-0.9 MG/250ML-% IV SOLN
0.0000 ug/min | INTRAVENOUS | Status: DC
  Administered 2020-04-07: 50 ug/min via INTRAVENOUS
  Administered 2020-04-07: 30 ug/min via INTRAVENOUS
  Administered 2020-04-07: 20 ug/min via INTRAVENOUS
  Administered 2020-04-08: 65 ug/min via INTRAVENOUS
  Administered 2020-04-08: 55 ug/min via INTRAVENOUS
  Filled 2020-04-06 (×6): qty 250

## 2020-04-06 MED ORDER — LIP MEDEX EX OINT
TOPICAL_OINTMENT | CUTANEOUS | Status: AC
  Filled 2020-04-06: qty 7

## 2020-04-06 MED ORDER — PHENYLEPHRINE HCL-NACL 10-0.9 MG/250ML-% IV SOLN
INTRAVENOUS | Status: AC
  Administered 2020-04-06: 20 ug/min via INTRAVENOUS
  Filled 2020-04-06: qty 250

## 2020-04-06 MED ORDER — FLUMAZENIL 0.5 MG/5ML IV SOLN
INTRAVENOUS | Status: AC
  Administered 2020-04-06: 0.5 mg via INTRAVENOUS
  Filled 2020-04-06: qty 5

## 2020-04-06 MED ORDER — DEXMEDETOMIDINE HCL IN NACL 400 MCG/100ML IV SOLN
0.2000 ug/kg/h | INTRAVENOUS | Status: AC
  Administered 2020-04-06 – 2020-04-07 (×2): 0.4 ug/kg/h via INTRAVENOUS
  Administered 2020-04-07 – 2020-04-08 (×3): 0.2 ug/kg/h via INTRAVENOUS
  Filled 2020-04-06 (×5): qty 100

## 2020-04-06 MED ORDER — IPRATROPIUM BROMIDE 0.02 % IN SOLN
0.5000 mg | Freq: Three times a day (TID) | RESPIRATORY_TRACT | Status: DC
  Administered 2020-04-07 – 2020-04-09 (×8): 0.5 mg via RESPIRATORY_TRACT
  Filled 2020-04-06 (×8): qty 2.5

## 2020-04-06 MED ORDER — FLUMAZENIL 0.5 MG/5ML IV SOLN: 0.5000 mg | Freq: Once | INTRAVENOUS | Status: AC

## 2020-04-06 MED ORDER — LEVALBUTEROL HCL 0.63 MG/3ML IN NEBU
0.6300 mg | INHALATION_SOLUTION | Freq: Three times a day (TID) | RESPIRATORY_TRACT | Status: DC
  Administered 2020-04-07 – 2020-04-09 (×8): 0.63 mg via RESPIRATORY_TRACT
  Filled 2020-04-06 (×7): qty 3

## 2020-04-06 MED ORDER — METHYLPREDNISOLONE SODIUM SUCC 40 MG IJ SOLR
40.0000 mg | INTRAMUSCULAR | Status: DC
  Administered 2020-04-07 – 2020-04-08 (×2): 40 mg via INTRAVENOUS
  Filled 2020-04-06 (×2): qty 1

## 2020-04-06 NOTE — Progress Notes (Signed)
HEMATOLOGY-ONCOLOGY PROGRESS NOTE  SUBJECTIVE: Continues to require BiPAP intermittently.  He was very restless this morning and received IV Ativan this morning.  He is very sedated at the time my visit.  Sitter at the bedside.  Oncology History Overview Note  Patient presented with abnormal LDCT screening exam 11/26/15  Non-small cell carcinoma of lung, stage 2 (HCC)   Staging form: Lung, AJCC 7th Edition     Clinical stage from 01/21/2016: Stage IIA (T1b, N1, M0) - Signed by Curt Bears, MD on 01/23/2016     Malignant neoplasm of right upper lobe of lung ([place])  12/11/2015 Imaging   PET IMPRESSION: 2.7 cm spiculated hypermetabolic nodule in central right upper lobe, consistent with primary bronchogenic carcinoma.  Mild hypermetabolic right hilar lymphadenopathy, consistent with metastatic disease.  11 mm sub-solid left lower   01/11/2016 Pathology Results   BRONCHIAL BRUSHING(EBUS) RUL (SPECIMEN 4 OF 4 COLLECTED 01/11/16): NON SMALL CELL CARCINOMA. FINE NEEDLE ASPIRATION, ENDOSCOPIC (EBUS), RIGHT PERI-HILAR NODULE(SPECIMEN 1 OF 4 COLLECTED 01/11/16): NON-SMALL CELL CARCINOMA.    01/11/2016 Surgery   Operation: Flexible video fiberoptic bronchoscopy with endobronchial ultrasound and biopsies.    01/21/2016 Initial Diagnosis   Non-small cell carcinoma of lung, stage 2 ([place])   01/26/2016 -  Radiation Therapy   SIM   02/01/2016 -  Chemotherapy   1st chemotherapy taxol/carbo   03/09/2020 -  Chemotherapy   The patient had palonosetron (ALOXI) injection 0.25 mg, 0.25 mg, Intravenous,  Once, 2 of 4 cycles Administration: 0.25 mg (03/09/2020), 0.25 mg (03/31/2020) pegfilgrastim-jmdb (FULPHILA) injection 6 mg, 6 mg, Subcutaneous,  Once, 2 of 4 cycles Administration: 6 mg (03/11/2020) CARBOplatin (PARAPLATIN) 580 mg in sodium chloride 0.9 % 250 mL chemo infusion, 580 mg (100 % of original dose 584 mg), Intravenous,  Once, 2 of 4 cycles Dose modification: 584 mg (original dose 584 mg, Cycle 1), 584  mg (original dose 584 mg, Cycle 3) Administration: 580 mg (03/09/2020), 580 mg (03/31/2020) fosaprepitant (EMEND) 150 mg in sodium chloride 0.9 % 145 mL IVPB, 150 mg, Intravenous,  Once, 2 of 4 cycles Administration: 150 mg (03/09/2020), 150 mg (03/31/2020) PACLitaxel (TAXOL) 402 mg in sodium chloride 0.9 % 500 mL chemo infusion (> 71m/m2), 175 mg/m2 = 402 mg (100 % of original dose 175 mg/m2), Intravenous,  Once, 2 of 4 cycles Dose modification: 175 mg/m2 (original dose 175 mg/m2, Cycle 1, Reason: Provider Judgment) Administration: 402 mg (03/09/2020), 402 mg (03/31/2020) pembrolizumab (KEYTRUDA) 200 mg in sodium chloride 0.9 % 50 mL chemo infusion, 200 mg, Intravenous, Once, 2 of 6 cycles Administration: 200 mg (03/09/2020), 200 mg (03/31/2020)  for chemotherapy treatment.    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 [place]   03/09/2020 -  Chemotherapy   The patient had palonosetron (ALOXI) injection 0.25 mg, 0.25 mg, Intravenous,  Once, 2 of 4 cycles Administration: 0.25 mg (03/09/2020), 0.25 mg (03/31/2020) pegfilgrastim-jmdb (FULPHILA) injection 6 mg, 6 mg, Subcutaneous,  Once, 2 of 4 cycles Administration: 6 mg (03/11/2020) CARBOplatin (PARAPLATIN) 580 mg in sodium chloride 0.9 % 250 mL chemo infusion, 580 mg (100 % of original dose 584 mg), Intravenous,  Once, 2 of 4 cycles Dose modification: 584 mg (original dose 584 mg, Cycle 1), 584 mg (original dose 584 mg, Cycle 3) Administration: 580 mg (03/09/2020), 580 mg (03/31/2020) fosaprepitant (EMEND) 150 mg in sodium chloride 0.9 % 145 mL IVPB, 150 mg, Intravenous,  Once,  2 of 4 cycles Administration: 150 mg (03/09/2020), 150 mg (03/31/2020) PACLitaxel (TAXOL) 402 mg in sodium chloride 0.9 % 500 mL chemo infusion (> '80mg'$ /m2), 175 mg/m2 = 402 mg (100 % of original dose 175 mg/m2), Intravenous,  Once, 2 of 4 cycles Dose modification: 175 mg/m2  (original dose 175 mg/m2, Cycle 1, Reason: Provider Judgment) Administration: 402 mg (03/09/2020), 402 mg (03/31/2020) pembrolizumab (KEYTRUDA) 200 mg in sodium chloride 0.9 % 50 mL chemo infusion, 200 mg, Intravenous, Once, 2 of 6 cycles Administration: 200 mg (03/09/2020), 200 mg (03/31/2020)  for chemotherapy treatment.       REVIEW OF SYSTEMS:   Unable to obtain comprehensive review of systems secondary to patient condition  I have reviewed the past medical history, past surgical history, social history and family history with the patient and they are unchanged from previous note.   PHYSICAL EXAMINATION: ECOG PERFORMANCE STATUS: 3 - Symptomatic, >50% confined to bed  Vitals:   04/06/20 1416 04/06/20 1441  BP: 120/79   Pulse: (!) 101   Resp: (!) 22   Temp:    SpO2: 92% 100%   Filed Weights   04/01/20 1638  Weight: 102 kg    Intake/Output from previous day: 04/25 0701 - 04/26 0700 In: 120 [P.O.:120] Out: 1350 [Urine:1350]  GENERAL: Chronically ill-appearing male, no distress LUNGS: Diminished breath sounds  HEART: regular rate & rhythm and no murmurs and no lower extremity edema ABDOMEN:abdomen soft, non-tender and normal bowel sounds Musculoskeletal:no cyanosis of digits and no clubbing  NEURO: Sedated  LABORATORY DATA:  I have reviewed the data as listed CMP Latest Ref Rng & Units 04/06/2020 04/05/2020 04/04/2020  Glucose 70 - 99 mg/dL 139(H) 128(H) 294(H)  BUN 8 - 23 mg/dL 35(H) 34(H) 32(H)  Creatinine 0.61 - 1.24 mg/dL 0.61 0.75 0.81  Sodium 135 - 145 mmol/L 136 139 137  Potassium 3.5 - 5.1 mmol/L 4.9 4.2 4.9  Chloride 98 - 111 mmol/L 92(L) 84(L) 88(L)  CO2 22 - 32 mmol/L 39(H) 46(H) 40(H)  Calcium 8.9 - 10.3 mg/dL 8.3(L) 8.6(L) 8.7(L)  Total Protein 6.5 - 8.1 g/dL 6.0(L) 6.5 -  Total Bilirubin 0.3 - 1.2 mg/dL 0.9 0.7 -  Alkaline Phos 38 - 126 U/L 56 62 -  AST 15 - 41 U/L 31 28 -  ALT 0 - 44 U/L 33 25 -    Lab Results  Component Value Date   WBC 6.3  04/06/2020   HGB 9.2 (L) 04/06/2020   HCT 30.7 (L) 04/06/2020   MCV 101.7 (H) 04/06/2020   PLT 191 04/06/2020   NEUTROABS 5.2 04/06/2020    DG Chest 2 View  Result Date: 04/01/2020 CLINICAL DATA:  Worsening shortness of breath. History of non-small cell lung cancer. EXAM: CHEST - 2 VIEW COMPARISON:  Chest x-ray 02/28/2020 and PET-CT 02/04/2020 FINDINGS: Stable right upper lobe lung mass and surrounding extensive radiation changes with loss of volume in the right hemithorax. Slight increase in surrounding right lung density which could reflect pneumonitis or interstitial spread of tumor. The left lung remains relatively clear. Left IJ power port in good position without complicating features. No pleural effusions or pneumothorax. IMPRESSION: 1. Stable right upper lobe lung mass and surrounding radiation changes. 2. Slight increase in surrounding right lung density which could reflect pneumonitis or interstitial spread of tumor. Electronically Signed   By: Marijo Sanes M.D.   On: 04/01/2020 06:32   CT HEAD WO CONTRAST  Result Date: 04/01/2020 CLINICAL DATA:  Altered mental status. Lethargy. EXAM:  CT HEAD WITHOUT CONTRAST TECHNIQUE: Contiguous axial images were obtained from the base of the skull through the vertex without intravenous contrast. COMPARISON:  Head MRI 02/04/2016 FINDINGS: Brain: The study is mildly to moderately motion degraded despite an attempt at repeat imaging. Within this limitation, no acute large territory infarct, intracranial hemorrhage, mass, midline shift, or extra-axial fluid collection is identified. The ventricles and sulci are normal. Hypodensities in the cerebral white matter bilaterally are nonspecific but compatible with mild chronic small vessel ischemic disease. Vascular: Calcified atherosclerosis at the skull base. Skull: No fracture or suspicious osseous lesion. Sinuses/Orbits: Paranasal sinuses and mastoid air cells are clear. Bilateral cataract extraction. Other:  None. IMPRESSION: 1. Motion degraded examination without evidence of acute intracranial abnormality. 2. Mild chronic small vessel ischemic disease. Electronically Signed   By: Logan Bores M.D.   On: 04/01/2020 16:41   CT Angio Chest PE W and/or Wo Contrast  Result Date: 04/01/2020 CLINICAL DATA:  Hypoxemia. Shortness of breath. History of lung cancer. EXAM: CT ANGIOGRAPHY CHEST WITH CONTRAST TECHNIQUE: Multidetector CT imaging of the chest was performed using the standard protocol during bolus administration of intravenous contrast. Multiplanar CT image reconstructions and MIPs were obtained to evaluate the vascular anatomy. CONTRAST:  222m OMNIPAQUE IOHEXOL 350 MG/ML SOLN COMPARISON:  CT chest 01/20/2020.  PET-CT 02/04/2020. FINDINGS: Cardiovascular: The heart size is normal. No substantial pericardial effusion. Coronary artery calcification is evident. Atherosclerotic calcification is noted in the wall of the thoracic aorta. Left Port-A-Cath tip is positioned in the upper right atrium. No filling defects within the opacified pulmonary arteries to suggest the presence of an acute pulmonary embolus. Mediastinum/Nodes: No mediastinal lymphadenopathy. 11 mm short axis left hilar lymph node appears progressive in the interval. Consolidative changes are noted in the parahilar right lung. There is no axillary lymphadenopathy. The esophagus has normal imaging features. Lungs/Pleura: Centrilobular and paraseptal emphysema evident. Right upper lobe pulmonary mass measures 3.4 x 2.4 cm today compared to 3.0 x 2.6 cm previously. The collapse/consolidative changes in the right upper lobe are similar to minimally progressed in the interval. Since the prior study the patient has developed collapse/consolidative change in the right middle lobe as well. No substantial pleural effusion. Upper Abdomen: Right subdiaphragmatic soft tissue lesion measures 5.6 cm today compared to 4.8 cm previously. Musculoskeletal: No worrisome  lytic or sclerotic osseous abnormality. Cortical erosion with fracture of the anterior fourth rib is similar to prior. Midline subcutaneous cystic lesion in the anterior chest wall is stable, compatible with sebaceous cyst. Review of the MIP images confirms the above findings. IMPRESSION: 1. No CT evidence for acute pulmonary embolus. 2. Continued progression of the right upper lobe pulmonary mass with worsening right upper lobe collapse/consolidation and interval development of complete collapse/consolidation of the right middle lobe. No substantial pleural effusion at this time. 3. Slight progression of the right subdiaphragmatic soft tissue lesion posterior to hepatic dome. This lesion was not hypermetabolic on previous PET-CT. Continued attention on follow-up recommended. 4. Mild progression of left hilar lymphadenopathy. 5.  Emphysema (ICD10-J43.9) and Aortic Atherosclerosis (ICD10-170.0) Electronically Signed   By: EMisty StanleyM.D.   On: 04/01/2020 09:38   DG CHEST PORT 1 VIEW  Result Date: 04/06/2020 CLINICAL DATA:  Shortness of breath EXAM: PORTABLE CHEST 1 VIEW COMPARISON:  Yesterday FINDINGS: Known right upper lobe mass with adjacent collapse/consolidation as evaluated by recent CT. There is no edema, consolidation, effusion, or pneumothorax. Normal heart size and stable post treatment distortion of mediastinal contours. Left-sided port with  tip in good position. Stable heart size. IMPRESSION: Stable post treatment appearance of the chest. Electronically Signed   By: Monte Fantasia M.D.   On: 04/06/2020 05:37   DG CHEST PORT 1 VIEW  Result Date: 04/05/2020 CLINICAL DATA:  Shortness of breath EXAM: PORTABLE CHEST 1 VIEW COMPARISON:  04/04/2020 FINDINGS: LEFT-sided Port-A-Cath remains in place terminating at caval to atrial junction. Cardiomediastinal contours are stable with extensive post treatment changes in the RIGHT chest and middle lobe collapse with similar appearance. LEFT chest is clear.  No sign of pleural effusion. No acute bone finding. IMPRESSION: 1. No acute cardiopulmonary abnormality. 2. Post treatment changes in the RIGHT chest with similar appearance to prior study. Essentially no change in the appearance of the chest compared to the prior exam. Electronically Signed   By: Zetta Bills M.D.   On: 04/05/2020 07:40   DG CHEST PORT 1 VIEW  Result Date: 04/04/2020 CLINICAL DATA:  79 year old male with history of shortness of breath. EXAM: PORTABLE CHEST 1 VIEW COMPARISON:  Chest x-ray 04/03/2020. FINDINGS: Left-sided single-lumen internal jugular power porta cath with tip terminating at the superior cavoatrial junction. Known right upper lobe mass and associated tumor infiltration and atelectasis in the right upper lobe and right middle lobe appears grossly similar to the prior study resulting in a focal opacity projecting over the right mid lung. Improved aeration in the right lung base when compared to the prior study, suggesting resolving areas of airspace consolidation and/or atelectasis in the right lower lobe. Left lung is clear. No pleural effusions. No evidence of pulmonary edema. Heart size is mildly enlarged. IMPRESSION: 1. Improving aeration in the right lower lobe which likely reflects resolving atelectasis and/or consolidation. Otherwise, the radiographic appearance the chest is very similar to the prior study, as above. Electronically Signed   By: Vinnie Langton M.D.   On: 04/04/2020 06:46   DG CHEST PORT 1 VIEW  Result Date: 04/03/2020 CLINICAL DATA:  Shortness of breath. EXAM: PORTABLE CHEST 1 VIEW COMPARISON:  04/01/2020 chest x-ray and CT scan. FINDINGS: The left IJ power port is stable. The cardiac silhouette, mediastinal and hilar contours are unchanged. Stable right upper lobe lung mass and surrounding radiation fibrosis. Overall slight worsening right lung aeration which could be due to surrounding pneumonitis or progressive atelectasis. The left lung remains  clear. No pneumothorax. IMPRESSION: 1. Stable right upper lobe lung mass and surrounding radiation fibrosis. 2. Slight worsening right lung aeration possibly due to surrounding pneumonitis or progressive atelectasis. Electronically Signed   By: Marijo Sanes M.D.   On: 04/03/2020 07:06   IR IMAGING GUIDED PORT INSERTION  Result Date: 03/23/2020 INDICATION: History of recurrent lung cancer. In need durable intravenous access for chemotherapy administration. EXAM: IMPLANTED PORT A CATH PLACEMENT WITH ULTRASOUND AND FLUOROSCOPIC GUIDANCE COMPARISON:  PET-CT-02/05/2020; chest CT-01/20/2020 MEDICATIONS: Ancef 2 gm IV; The antibiotic was administered within an appropriate time interval prior to skin puncture. ANESTHESIA/SEDATION: Moderate (conscious) sedation was employed during this procedure. A total of Versed 1 mg and Fentanyl 50 mcg was administered intravenously. Moderate Sedation Time: 40 minutes. The patient's level of consciousness and vital signs were monitored continuously by radiology nursing throughout the procedure under my direct supervision. CONTRAST:  None FLUOROSCOPY TIME:  36 seconds (19 mGy) COMPLICATIONS: None immediate. Note, patient was found have asymptomatic tachycardic prior to procedure. 12 lead EKG demonstrated sinus tachycardia and again, the patient was asymptomatic, specifically, no chest pain shortness of breath, with normal hemodynamic stability and as such the  decision was made to proceed with definitive Port a catheter placement. PROCEDURE: The procedure, risks, benefits, and alternatives were explained to the patient. Questions regarding the procedure were encouraged and answered. The patient understands and consents to the procedure. Given presence right-sided lung cancer recurrence, decision was made to proceed with left internal jugular approach port a catheter placement. The left neck and chest were prepped with chlorhexidine in a sterile fashion, and a sterile drape was applied  covering the operative field. Maximum barrier sterile technique with sterile gowns and gloves were used for the procedure. A timeout was performed prior to the initiation of the procedure. Local anesthesia was provided with 1% lidocaine with epinephrine. After creating a small venotomy incision, a micropuncture kit was utilized to access the internal jugular vein. Real-time ultrasound guidance was utilized for vascular access including the acquisition of a permanent ultrasound image documenting patency of the accessed vessel. The microwire was utilized to measure appropriate catheter length. A subcutaneous port pocket was then created along the upper chest wall utilizing a combination of sharp and blunt dissection. The pocket was irrigated with sterile saline. A single lumen "ISP" sized power injectable port was chosen for placement. The 8 Fr catheter was tunneled from the port pocket site to the venotomy incision. The port was placed in the pocket. The external catheter was trimmed to appropriate length. At the venotomy, an 8 Fr peel-away sheath was placed over a guidewire under fluoroscopic guidance. The catheter was then placed through the sheath and the sheath was removed. Final catheter positioning was confirmed and documented with a fluoroscopic spot radiograph. The port was accessed with a Huber needle, aspirated and flushed with heparinized saline. The venotomy site was closed with an interrupted 4-0 Vicryl suture. The port pocket incision was closed with interrupted 2-0 Vicryl suture. The skin was opposed with a running subcuticular 4-0 Vicryl suture. Dermabond and Steri-strips were applied to both incisions. Dressings were applied. The patient tolerated the procedure well without immediate post procedural complication. FINDINGS: After catheter placement, the tip lies within the superior cavoatrial junction. The catheter aspirates and flushes normally and is ready for immediate use. IMPRESSION: Successful  placement of a left internal jugular approach power injectable Port-A-Cath. The catheter is ready for immediate use. Electronically Signed   By: Sandi Mariscal M.D.   On: 03/23/2020 16:09    ASSESSMENT AND PLAN: This is a pleasant 79 year old African-American male recurrent non-small cell lung cancer, squamous cell carcinoma presented initially as unresectable stage IIa non-small cell lung cancer, squamous cell carcinoma the right upper lobe lung nodule and hilar lymphadenopathy.  He was initially diagnosed in January 2017.  He is status post a course of concurrent chemoradiation.  He was on observation since April 2017.  More recently, he was found to have an enlarging soft tissue mass in the right upper lobe suspicious for disease recurrence.  He underwent repeat bronchoscopy with biopsy of the suspicious recurrent lung cancer under the care of Dr. Lamonte Sakai and the final pathology was consistent with a squamous cell carcinoma.  He is receiving systemic chemotherapy with carboplatin for an AUC of 5, paclitaxel 175 mg meter squared, and Keytruda 200 mg IV every 3 weeks.  He is status post 2 cycles of his treatment.  Last treatment was given on 03/31/2020.    CT angiogram of the chest performed on admission showed no evidence for PE but did show continued progression of the right upper lobe pulmonary mass with worsening right upper lobe collapse/consolidation  as well as interval development of complete collapse/consolidation of the right middle lobe.  There was also slight progression of the right subdiaphragmatic soft tissue lesion posterior to the hepatic dome.  These results have been discussed with the patient as well as his daughter, [name], and his sister, Denyse Amass on 04/03/2020.  Medical oncology recommends for the patient to go on hospice at this point.  Additional chemotherapy would not be helpful in this situation.  I reached out to his daughter, [name], per nursing and hospitalist request.  I again  reviewed his CT findings and recommendations from Dr. Julien Nordmann about transitioning to hospice and that additional chemotherapy would not be helpful.  She states understanding of this.  We also talked about CODE STATUS and I recommended that they change CODE STATUS to DNR.  The patient's daughter wishes to discuss this further with the rest of the family before making a final decision.   LOS: 5 days   [name], DNP, AGPCNP-BC, AOCNP 04/06/20

## 2020-04-06 NOTE — Progress Notes (Signed)
SLP Cancellation Note  Patient Details Name: Ryan Contreras MRN: [id number] DOB: 1940-12-24   Cancelled treatment:       Reason Eval/Treat Not Completed: Other (comment)(pt on bipap at this time, per RN oncology going to meet with family face to face)  Kathleen Lime, MS [facility] SLP Acute Rehab Services Office [phone]  Macario Golds 04/06/2020, 2:08 PM

## 2020-04-06 NOTE — Progress Notes (Signed)
PROGRESS NOTE    Ryan Contreras  MQK:[id number] DOB: 11-24-1941 DOA: 04/01/2020 PCP: Cyndi Bender, PA-C   Brief Narrative: HPI per Dr. Theda Belfast on 04/01/20 Ryan Contreras is a 79 y.o. 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  Patient is a poor historian and does not provide accurate history.  I have reviewed patient's medical records and communicated with Dr. Julien Nordmann and Dr. Kyung Rudd as well.  Patient had his second cycle of current chemotherapy on 03/31/2020 and presented today to the ED with worsening shortness of breath.  At baseline, he wears 2 to 3 L of oxygen at home.  Patient complains of progressively worsening shortness of breath even with minimal exertion.  He denies fevers or worsening cough.  No chest pain.  Denies nausea, vomiting, diarrhea, abdominal pain, dysuria.  He does report decreased p.o. intake.  No known Covid exposures.  ED Course: He required 4 to 5 L oxygen via nasal cannula in the ED; his oxygen saturation was found to be 81% by EMS.  Chest x-ray showed possible worsening of mass.  CT of the chest was negative for PE but showed worsening of his cancer along with worsening of right upper lobe collapse and new right middle lobe collapse.  ED provider updated Dr. Julien Nordmann.  Hospitalist service was called to evaluate the patient.  **Interim History Has been intermittently using BiPAP given his somnolence and hypercarbia but is becoming agitated wearing it.  SLP evaluated and recommending dysphagia 3 diet.  PT OT recommending SNF.  Pulmonary and medical oncology evaluated recommending IV antibiotics and steroids asnd we  have been weaning steroids.  Palliative care medicine was consulted for goals of care discussion and will continue full scope of treatment currently while we continue palliative care discussions and the plan is for another family care discussion in the a.m.  Oncology reportedly stated the patient is no longer a candidate for chemotherapy.  Family still wants aggressive measures and continued scope of treatment for now but want further discuss the patient's case with Dr. Julien Nordmann.  Patient continues to be agitated and now requires a sitter and has been given Haldol but this is likely in part to his steroids.  Blood count yesterday morning was spuriously low but was repeated and is okay.  He continues to intermittently wear the BiPAP and has been little bit more tolerant.  Has been started on hydroxyzine and Ativan.  Continues to have hypercapnia.  Also now with a sitter.  Also started getting alkalosis so we have started Diamox.  04/06/2020 Overnight he had worsening agitation and ended up getting put on Precedex which dropped his blood pressure and then had short-term Neo-Synephrine placed on.  He received Ativan early this morning and was on the BiPAP and he was asleep when I saw him.  Medical oncology continues to recommend hospice at this point and feel that chemotherapy has no further benefit.  Medical oncology met with the family and reinforced these recommendations and family to discuss care about the patient and his CODE STATUS amongst themselves.  Assessment & Plan:   Principal Problem:   Acute on chronic respiratory failure (HCC) Active Problems:   Hyperlipidemia   Gout  Essential hypertension   CAD, NATIVE VESSEL   COPD with acute exacerbation (HCC)   Primary malignant neoplasm of right lung metastatic to other site [facility])  Acute on Chronic hypoxic respiratory failure quiring noninvasive positive pressure ventilation with BiPAP intermittently COPD exacerbation Recurrent non-small cell  lung cancer currently on chemotherapy causing right upper lobe and right middle lobe collapse/consolidation Probable postobstructive pneumonia -Patient normally wears 2 to 3 L oxygen by nasal cannula at home.  Currently requiring 4 to 5 L oxygen and then had to be placed on BiPAP and has intermittently when wearing it -Had another ABG done yesterday afternoon as nursing stated he wa more confused and somnolent and showed a pH of 7.328, PCO2 of 91.6, PO2 of 123, bicarbonate level 46.7, ABG O2 saturation 97.6-CT of the chest showing right upper lobe and right middle lobe collapse/consolidation with possibility of postobstructive pneumonia -Patient was started on Solu-Medrol in the ED.  Will continue Solu-Medrol 60 mg IV every 8 hours and went to every 12 hours yesterday and will decrease to 40 mg every 12 and hopefully transition to 40 mg daily today and then to p.o. next few days -Patient this morning was extremely somnolent and received IV Ativan. -Because of his agitation last night he had to be placed on a Precedex drip which then dropped his blood pressure so then subsequently he was placed on a Neo-Synephrine drip. -Continue Trelegy.  Continue nebs.  Incentive spirometry/flutter valve -We will use broad-spectrum antibiotics cefepime/vancomycin for now.  Also will order blood cultures -Palliative Care consultation for goals of care discussion.  I have notified Dr. Mohamed/oncology about the patient: Patient requests evaluation by Dr. Julien Nordmann while inpatient; unclear if Dr. Earlie Server has seen the patient or not  but reportedly he had called the family the day before yesterday and stated the patient is no longer a candidate for chemotherapy -Patient's family wanted to speak with Dr. Earlie Server in person as they have several oncology questions that they want answered medical oncology PA came by and reinforced the recommendations of hospice and no further chemotherapy.  Patient's family did discuss them  amongst himself as well as discussion about the patient's CODE STATUS -Pulmonary was consulted regarding the patient who recommends continuing broad-spectrum antibiotics and steroids as well as continuing supplemental oxygen via nasal cannula, aggressive pulmonary hygiene and following intermittent chest x-ray and ABG and have signed off the case -SLP evaluation recommending a dysphagia 3 diet -Medical oncology recommends continuing IV antibiotics and Solu-Medrol and if the patient's status continues deteriorate there agreement of pursuing palliative care and hospice for this patient; apparently Dr. Earlie Server has recommended normal chemotherapy and family had several questions -Patient has gotten a little bit more alkalotic with a CO2 of 46, anion gap is 9; we will start the patient on Diamox 500 mg p.o. daily -Because he had extravasation of contrast on the right upper extremity and he is undergoing the extravasation protocol -Continue to monitor respiratory status carefully and repeat chest x-ray in the a.m. -dietitian consulted and recommending boost breeze once a day and Ensure Enlive p.o. twice daily as well as a daily multivitamin -PT OT recommending skilled nursing facility -Palliative care was consulted for further goals of care treatment and they discussed with medical oncology today and the plan is to continue to follow his clinical course with treatment of his pneumonia and continue full scope of treatment as well as continue conversation regarding long-term goals of care based on his clinical course for the duration  of his admission; another family meeting is set up for tomorrow and will see if Dr. Earlie Server can come by Monday to discuss and answer the patient's family's questions and concerns -The patient was hopeful to pursue disease modifying therapy for his lung cancer but unsure if this is feasible as medical oncology has reportedly recommended normal chemotherapy to the patient's  family -Patient's clinical status is tenuous and he continues to remain on intermittent BiPAP.  We will continue aggressive treatment as above and continue BiPAP if he tolerates but he likely continues to be agitated and so we have given him some Haldol; the agitation is in part slightly secondary to his steroids -Also continues to have some dyspnea so we will start low-dose morphine IV 1 mg daily as needed for pain as well as dyspnea; because he continues to be somewhat agitated we will also add some Ativan 1 mg every 3 hours as needed and also added hydroxyzine  -Despite the measures as above the patient became extremely agitated and has been placed on a Precedex drip which then dropped his blood pressure; then temporarily he had to be placed on Neo-Synephrine and he is off of this now.  Now he does has as needed's and was given IV Ativan this morning and was somnolent when I saw him and sleeping on the BiPAP in no acute distress -Continue monitor respiratory status carefully and also continue repeat chest x-ray in a.m.  Leukocytosis -Probably from above is trending down now.  -WBC went from 22.2 and is now trended down to 6.3 -Continue to monitor and trend and repeat CBC in a.m.  Chronic Macrocytic Anemia -Patient's hemoglobin/hematocrit went from 11.7/40.5 and is now at 9.2/30.7 -MCV was 101.7 -Continue monitor for signs and symptoms of bleeding; currently no overt bleeding on  -repeat CBC in a.m.  Hypertension -Continue to Monitor BP carefully.   -Continue metoprolol for now.  -Eplerenone held for now. -Last BP was 142/62 -Received Neo-Synephrine last night after his blood pressure dropped after the Precedex drip  Hyperkalemia -Mild date was 5.6 and improved to 4.2 this morning -Continue to Hold Potassium Supplementation -Continue to Monitor and Trend -Currently holding Eplerenone  -Because it remains elevated we will start Lokelma 10 mg p.o. daily and have stopped it  Yesterday   -Repeat CMP in AM   History of Coronary Artery Disease -No chest pain.   -Continue ASA 81 mg po Daily, Clopidogrel 75 mg po Daily, Atrovastatin 80 mg po qHS as well as beta-blocker with Metoprolol Succinate 25 mg po Daily.  -Outpatient follow-up with cardiology if he improves enough to get discharged  Hyperlipidemia -Continue Atorvastatin 80 mg po qHS  Gout -Continue Allopurinol 100 mg po Daily   Generalized deconditioning -Overall prognosis is guarded to poor.   -Palliative care consultation and appreciate continued goals of care discussion and will be continuing palliative care and family discussions but family wants to Speak with Oncology; medical oncology came by again today specifically the PA and they recommend hospice again and no further chemotherapy -PT evaluation done and they are recommending SNF so we will need to consult transition of care to assist with placement if the patient wants to pursue SNF -Family discussed amongst himself about the CODE STATUS and about further care  DVT prophylaxis: Enoxaparin 40 mg sq q24h Code Status: FULL CODE  Family Communication: No family present at bedside this AM  Disposition Plan: Patient is from home now requiring SNF level of care and will need to  have improvement of his respiratory status prior to safe discharge; family discussing his CODE STATUS and possible discharge disposition and medical oncology has now recommended hospice  Status is: Inpatient  Remains inpatient appropriate because:Inpatient level of care appropriate due to severity of illness  Dispo: The patient is from: Home              Anticipated d/c is to: SNF if this is decided by family versus hospice              Anticipated d/c date is: 2 days              Patient currently is not medically stable to d/c.  Consultants:   Medical Oncology  Pulmonary Critical Care  Interventional Radiology    Procedures: None   Antimicrobials:  Anti-infectives  (From admission, onward)   Start     Dose/Rate Route Frequency Ordered Stop   04/02/20 1800  vancomycin (VANCOREADY) IVPB 1750 mg/350 mL  Status:  Discontinued     1,750 mg 175 mL/hr over 120 Minutes Intravenous Every 24 hours 04/01/20 1125 04/02/20 1731   04/02/20 1000  azithromycin (ZITHROMAX) tablet 500 mg  Status:  Discontinued     500 mg Oral Daily 04/01/20 1040 04/01/20 1103   04/01/20 1200  vancomycin (VANCOREADY) IVPB 2000 mg/400 mL     2,000 mg 200 mL/hr over 120 Minutes Intravenous  Once 04/01/20 1118 04/01/20 2004   04/01/20 1200  ceFEPIme (MAXIPIME) 2 g in sodium chloride 0.9 % 100 mL IVPB     2 g 200 mL/hr over 30 Minutes Intravenous Every 8 hours 04/01/20 1118     04/01/20 1100  azithromycin (ZITHROMAX) 500 mg in sodium chloride 0.9 % 250 mL IVPB  Status:  Discontinued     500 mg 250 mL/hr over 60 Minutes Intravenous Every 24 hours 04/01/20 1040 04/01/20 1103     Subjective: Seen and examined at bedside he is more somnolent this morning and he received IV Ativan.  He is on the BiPAP currently tolerating this without issues.  Sitter is at bedside.  He appears to be in no acute distress and cannot provide a subjective history at this time given that he is asleep secondary to the Ativan administration.  Objective: Vitals:   04/06/20 1441 04/06/20 1559 04/06/20 1600 04/06/20 1825  BP:   124/82 (!) 142/62  Pulse:  (!) 106 (!) 107 (!) 103  Resp:  (!) 31 (!) 37 19  Temp:   97.6 F (36.4 C)   TempSrc:   Oral   SpO2: 100% 100% 100% 98%  Weight:      Height:        Intake/Output Summary (Last 24 hours) at 04/06/2020 1952 Last data filed at 04/06/2020 1745 Gross per 24 hour  Intake 950.55 ml  Output 1875 ml  Net -924.45 ml   Filed Weights   04/01/20 1638  Weight: 102 kg   Examination: Physical Exam:  Constitutional: The patient is an overweight chronically ill-appearing African-American male who is in no acute distress on the BiPAP now is asleep and calmer but he had  received Ativan earlier Eyes: Lids and conjunctivae normal, sclerae anicteric  ENMT: External Ears, Nose appear normal.  Neck: Appears normal, supple, no cervical masses, normal ROM, no appreciable thyromegaly; no JVD Respiratory: Diminished to auscultation bilaterally with coarse breath sounds and some crackles; currently is on the BiPAP and resting  Cardiovascular: Tachycardic rate but regular rhythm, is 1+ lower extremity edema Abdomen: Soft,  non-tender, distended secondary body habitus. No masses palpated. No appreciable hepatosplenomegaly. Bowel sounds positive.  GU: Deferred. Musculoskeletal: No clubbing / cyanosis of digits/nails. No joint deformity upper and lower extremities.  Skin: No rashes, lesions, ulcers on limited skin evaluation. No induration; Warm and dry.  Neurologic: He is asleep and does not follow commands Psychiatric: Impaired judgment and insight.  Currently not alert and oriented x 3.  Somnolent and drowsy.    Data Reviewed: I have personally reviewed following labs and imaging studies  CBC: Recent Labs  Lab 04/03/20 0301 04/04/20 0725 04/04/20 1017 04/05/20 0500 04/06/20 0930  WBC 17.1* 9.8 12.5* 9.4 6.3  NEUTROABS 16.2* 9.2* 12.0* 8.6* 5.2  HGB 10.4* 7.1* 10.2* 9.4* 9.2*  HCT 35.8* 24.2* 33.7* 31.1* 30.7*  MCV 105.3* 104.8* 102.4* 98.7 101.7*  PLT 317 198 265 232 397   Basic Metabolic Panel: Recent Labs  Lab 04/03/20 0301 04/04/20 0725 04/04/20 1017 04/05/20 0500 04/06/20 0930  NA 137 141 137 139 136  K 5.6* 3.5 4.9 4.2 4.9  CL 92* 103 88* 84* 92*  CO2 40* 33* 40* 46* 39*  GLUCOSE 171* 116* 294* 128* 139*  BUN 33* 23 32* 34* 35*  CREATININE 0.95 0.43* 0.81 0.75 0.61  CALCIUM 8.2* 6.3* 8.7* 8.6* 8.3*  MG 2.4 1.6*  --  2.1 2.0  PHOS 3.3 2.2*  --  3.9 3.9   GFR: Estimated Creatinine Clearance: 98.6 mL/min (by C-G formula based on SCr of 0.61 mg/dL). Liver Function Tests: Recent Labs  Lab 04/02/20 0304 04/03/20 0301 04/04/20 0725  04/05/20 0500 04/06/20 0930  AST '25 26 19 28 31  ' ALT '21 23 17 25 ' 33  ALKPHOS 80 78 49 62 56  BILITOT 0.3 0.3 0.4 0.7 0.9  PROT 6.6 7.3 4.8* 6.5 6.0*  ALBUMIN 2.6* 2.8* 2.0* 2.6* 2.5*   No results for input(s): LIPASE, AMYLASE in the last 168 hours. No results for input(s): AMMONIA in the last 168 hours. Coagulation Profile: No results for input(s): INR, PROTIME in the last 168 hours. Cardiac Enzymes: No results for input(s): CKTOTAL, CKMB, CKMBINDEX, TROPONINI in the last 168 hours. BNP (last 3 results) No results for input(s): PROBNP in the last 8760 hours. HbA1C: No results for input(s): HGBA1C in the last 72 hours. CBG: Recent Labs  Lab 04/01/20 1535 04/06/20 0432  GLUCAP 113* 139*   Lipid Profile: No results for input(s): CHOL, HDL, LDLCALC, TRIG, CHOLHDL, LDLDIRECT in the last 72 hours. Thyroid Function Tests: No results for input(s): TSH, T4TOTAL, FREET4, T3FREE, THYROIDAB in the last 72 hours. Anemia Panel: No results for input(s): VITAMINB12, FOLATE, FERRITIN, TIBC, IRON, RETICCTPCT in the last 72 hours. Sepsis Labs: Recent Labs  Lab 04/01/20 0552 04/02/20 0304  PROCALCITON  --  <0.10  LATICACIDVEN 0.9  --     Recent Results (from the past 240 hour(s))  SARS CORONAVIRUS 2 (TAT 6-24 HRS) Nasopharyngeal Nasopharyngeal Swab     Status: None   Collection Time: 04/01/20  6:03 AM   Specimen: Nasopharyngeal Swab  Result Value Ref Range Status   SARS Coronavirus 2 NEGATIVE NEGATIVE Final    Comment: (NOTE) SARS-CoV-2 target nucleic acids are NOT DETECTED. The SARS-CoV-2 RNA is generally detectable in upper and lower respiratory specimens during the acute phase of infection. Negative results do not preclude SARS-CoV-2 infection, do not rule out co-infections with other pathogens, and should not be used as the sole basis for treatment or other patient management decisions. Negative results must be combined with clinical  observations, patient history, and  epidemiological information. The expected result is Negative. Fact Sheet for Patients: SugarRoll.be Fact Sheet for Healthcare Providers: https://www.woods-mathews.com/ This test is not yet approved or cleared by the [place] FDA and  has been authorized for detection and/or diagnosis of SARS-CoV-2 by FDA under an Emergency Use Authorization (EUA). This EUA will remain  in effect (meaning this test can be used) for the duration of the COVID-19 declaration under Section 56 4(b)(1) of the Act, 21 U.S.C. section 360bbb-3(b)(1), unless the authorization is terminated or revoked sooner. Performed at [facility], [place] [address], [place], [place] [address]   MRSA culture     Status: None   Collection Time: 04/01/20 11:49 AM   Specimen: Body Fluid  Result Value Ref Range Status   Specimen Description   Final    NASAL SWAB Performed at [place] [address] [place], [place] [address]    Special Requests   Final    NONE Performed at [facility], [place] [address] [place], [place] [address]    Culture   Final    NO GROWTH Performed at [facility], [place] [address], [place], [place] [address]    Report Status 04/02/2020 FINAL  Final  Culture, blood (routine x 2)     Status: None   Collection Time: 04/01/20 11:49 AM   Specimen: BLOOD  Result Value Ref Range Status   Specimen Description   Final    BLOOD PORTA CATH LEFT Performed at [facility], [place] [address] [place], [place] [address]    Special Requests   Final    BOTTLES DRAWN AEROBIC AND ANAEROBIC Blood Culture adequate volume Performed at [place] [address], [place], [place] [address]    Culture   Final    NO GROWTH 5 DAYS Performed at [facility], [place] [address], [place], [place] [address]    Report Status 04/06/2020 FINAL  Final  Culture, blood (routine x 2)     Status: None    Collection Time: 04/01/20 11:49 AM   Specimen: BLOOD  Result Value Ref Range Status   Specimen Description   Final    BLOOD PORTA CATH LEFT Performed at [facility], [place] [address], [place], [place] [address]    Special Requests   Final    BOTTLES DRAWN AEROBIC AND ANAEROBIC Blood Culture adequate volume Performed at [place] [address], [place], [place] [address]    Culture   Final    NO GROWTH 5 DAYS Performed at [facility], [place] [address], [place], [place] [address]    Report Status 04/06/2020 FINAL  Final  MRSA PCR Screening     Status: None   Collection Time: 04/01/20  6:00 PM   Specimen: Nasopharyngeal  Result Value Ref Range Status   MRSA by PCR NEGATIVE NEGATIVE Final    Comment:        The GeneXpert MRSA Assay (FDA approved for NASAL specimens only), is one component of a comprehensive MRSA colonization surveillance program. It is not intended to diagnose MRSA infection nor to guide or monitor treatment for MRSA infections. Performed at [facility], [place] [address] [place], [place] [address]      RN Pressure Injury Documentation:     Estimated body mass index is 29.67 kg/m as calculated from the following:   Height as of this encounter: 6'  1" (1.854 m).   Weight as of this encounter: 102 kg.  Malnutrition Type:  Nutrition Problem: Increased nutrient needs Etiology: acute illness, chronic illness, cancer and cancer related treatments   Malnutrition Characteristics:  Signs/Symptoms: estimated needs   Nutrition Interventions:  Interventions: Ensure Enlive (each supplement provides 350kcal and 20 grams of protein), MVI, Boost Breeze Radiology Studies: DG CHEST PORT 1 VIEW  Result Date: 04/06/2020 CLINICAL DATA:  Shortness of breath EXAM: PORTABLE CHEST 1 VIEW COMPARISON:  Yesterday FINDINGS: Known right upper lobe mass with adjacent collapse/consolidation as evaluated by recent CT. There is  no edema, consolidation, effusion, or pneumothorax. Normal heart size and stable post treatment distortion of mediastinal contours. Left-sided port with tip in good position. Stable heart size. IMPRESSION: Stable post treatment appearance of the chest. Electronically Signed   By: Monte Fantasia M.D.   On: 04/06/2020 05:37   DG CHEST PORT 1 VIEW  Result Date: 04/05/2020 CLINICAL DATA:  Shortness of breath EXAM: PORTABLE CHEST 1 VIEW COMPARISON:  04/04/2020 FINDINGS: LEFT-sided Port-A-Cath remains in place terminating at caval to atrial junction. Cardiomediastinal contours are stable with extensive post treatment changes in the RIGHT chest and middle lobe collapse with similar appearance. LEFT chest is clear. No sign of pleural effusion. No acute bone finding. IMPRESSION: 1. No acute cardiopulmonary abnormality. 2. Post treatment changes in the RIGHT chest with similar appearance to prior study. Essentially no change in the appearance of the chest compared to the prior exam. Electronically Signed   By: Zetta Bills M.D.   On: 04/05/2020 07:40   Scheduled Meds: . acetaZOLAMIDE  500 mg Oral Daily  . allopurinol  100 mg Oral Daily  . arformoterol  15 mcg Nebulization BID  . aspirin EC  81 mg Oral QHS  . atorvastatin  80 mg Oral QHS  . brimonidine  1 drop Both Eyes BID  . budesonide (PULMICORT) nebulizer solution  0.25 mg Nebulization BID  . chlorhexidine  15 mL Mouth Rinse BID  . Chlorhexidine Gluconate Cloth  6 each Topical Daily  . clopidogrel  75 mg Oral Daily  . docusate sodium  100 mg Oral Daily  . enoxaparin (LOVENOX) injection  40 mg Subcutaneous Q24H  . feeding supplement  1 Container Oral Q24H  . feeding supplement (ENSURE ENLIVE)  237 mL Oral BID BM  . ipratropium  0.5 mg Nebulization Q6H  . levalbuterol  0.63 mg Nebulization Q6H  . mouth rinse  15 mL Mouth Rinse q12n4p  . methylPREDNISolone (SOLU-MEDROL) injection  40 mg Intravenous Q12H  . metoprolol succinate  25 mg Oral Daily  .  multivitamin with minerals  1 tablet Oral Daily  . pantoprazole  40 mg Oral Daily  . sodium chloride flush  10-40 mL Intracatheter Q12H  . tamsulosin  0.4 mg Oral Daily   Continuous Infusions: . sodium chloride 10 mL/hr at 04/05/20 0100  . ceFEPime (MAXIPIME) IV 2 g (04/06/20 1936)  . dexmedetomidine (PRECEDEX) IV infusion 0.4 mcg/kg/hr (04/06/20 0333)  . phenylephrine (NEO-SYNEPHRINE) Adult infusion Stopped (04/06/20 0455)    LOS: 5 days   Kerney Elbe, DO [facility] PAGER is on AMION  If 7PM-7AM, please contact night-coverage [email or web address]

## 2020-04-06 NOTE — Progress Notes (Signed)
Pt status updated post ativan and morphine- at 2300- patient rested well. BIPap remained on and pt slept for 2.5 hours. Once self awaken- patient continued to remove bipap -climb out of bed not sure where he was and kept eyes closed- finally got patient to rest with nasal canula at 10 liters and went back to sleep. Pt remained to high CO2 close to 100 and that is not therapeutic. Able to change position and after 1. 5 hours pt attempted to climb out of bed- PRN Ativan given for restless ness. Finally rested and Bipap placed back on patient and Precedex orderd to keep patient calm. Blood pressure was reduced significantly and the medication stopped - bolus 575ml was given and NEO started for a very short while.  Patient aroused and feeling better.

## 2020-04-06 NOTE — Progress Notes (Signed)
eLink Physician-Brief Progress Note Patient Name: Ryan Contreras DOB: May 19, 1941 MRN: [id number]   Date of Service  04/06/2020  HPI/Events of Note  Pt with delirium resulting in constantly pulling off his oxygen mask. Multiple different PRN pyschotropic medications have been given tonight without calming him down.  eICU Interventions  Low dose Precedex infusion ordered.        Kerry Kass Norvell Caswell 04/06/2020, 3:23 AM

## 2020-04-07 ENCOUNTER — Inpatient Hospital Stay (HOSPITAL_COMMUNITY): Payer: Medicare PPO

## 2020-04-07 ENCOUNTER — Inpatient Hospital Stay: Payer: Medicare PPO

## 2020-04-07 DIAGNOSIS — Z7189 Other specified counseling: Secondary | ICD-10-CM | POA: Diagnosis not present

## 2020-04-07 DIAGNOSIS — M1A9XX0 Chronic gout, unspecified, without tophus (tophi): Secondary | ICD-10-CM | POA: Diagnosis not present

## 2020-04-07 DIAGNOSIS — R531 Weakness: Secondary | ICD-10-CM

## 2020-04-07 DIAGNOSIS — J9621 Acute and chronic respiratory failure with hypoxia: Secondary | ICD-10-CM | POA: Diagnosis not present

## 2020-04-07 DIAGNOSIS — C3491 Malignant neoplasm of unspecified part of right bronchus or lung: Secondary | ICD-10-CM | POA: Diagnosis not present

## 2020-04-07 DIAGNOSIS — I2510 Atherosclerotic heart disease of native coronary artery without angina pectoris: Secondary | ICD-10-CM | POA: Diagnosis not present

## 2020-04-07 DIAGNOSIS — Z515 Encounter for palliative care: Secondary | ICD-10-CM | POA: Diagnosis not present

## 2020-04-07 LAB — COMPREHENSIVE METABOLIC PANEL
ALT: 33 U/L (ref 0–44)
AST: 29 U/L (ref 15–41)
Albumin: 2.9 g/dL — ABNORMAL LOW (ref 3.5–5.0)
Alkaline Phosphatase: 64 U/L (ref 38–126)
Anion gap: 7 (ref 5–15)
BUN: 37 mg/dL — ABNORMAL HIGH (ref 8–23)
CO2: 37 mmol/L — ABNORMAL HIGH (ref 22–32)
Calcium: 8.9 mg/dL (ref 8.9–10.3)
Chloride: 90 mmol/L — ABNORMAL LOW (ref 98–111)
Creatinine, Ser: 0.65 mg/dL (ref 0.61–1.24)
GFR calc Af Amer: >60 mL/min (ref >60)
GFR calc non Af Amer: >60 mL/min (ref >60)
Glucose, Bld: 110 mg/dL — ABNORMAL HIGH (ref 70–99)
Potassium: 4.5 mmol/L (ref 3.5–5.1)
Sodium: 134 mmol/L — ABNORMAL LOW (ref 135–145)
Total Bilirubin: 1 mg/dL (ref 0.3–1.2)
Total Protein: 6.4 g/dL — ABNORMAL LOW (ref 6.5–8.1)

## 2020-04-07 LAB — CBC WITH DIFFERENTIAL/PLATELET
Abs Immature Granulocytes: 0.15 10*3/uL — ABNORMAL HIGH (ref 0.00–0.07)
Basophils Absolute: 0.1 10*3/uL (ref 0.0–0.1)
Basophils Relative: 1 %
Eosinophils Absolute: 0.1 10*3/uL (ref 0.0–0.5)
Eosinophils Relative: 2 %
HCT: 30.3 % — ABNORMAL LOW (ref 39.0–52.0)
Hemoglobin: 9.3 g/dL — ABNORMAL LOW (ref 13.0–17.0)
Immature Granulocytes: 2 %
Lymphocytes Relative: 17 %
Lymphs Abs: 1.4 10*3/uL (ref 0.7–4.0)
MCH: 30.7 pg (ref 26.0–34.0)
MCHC: 30.7 g/dL (ref 30.0–36.0)
MCV: 100 fL (ref 80.0–100.0)
Monocytes Absolute: 0.1 10*3/uL (ref 0.1–1.0)
Monocytes Relative: 1 %
Neutro Abs: 6.3 10*3/uL (ref 1.7–7.7)
Neutrophils Relative %: 77 %
Platelets: 190 10*3/uL (ref 150–400)
RBC: 3.03 MIL/uL — ABNORMAL LOW (ref 4.22–5.81)
RDW: 15.2 % (ref 11.5–15.5)
WBC: 8.1 10*3/uL (ref 4.0–10.5)
nRBC: 0 % (ref 0.0–0.2)

## 2020-04-07 LAB — PHOSPHORUS: Phosphorus: 3.3 mg/dL (ref 2.5–4.6)

## 2020-04-07 LAB — MAGNESIUM: Magnesium: 2 mg/dL (ref 1.7–2.4)

## 2020-04-07 MED ORDER — ORAL CARE MOUTH RINSE
15.0000 mL | Freq: Two times a day (BID) | OROMUCOSAL | Status: DC
  Administered 2020-04-07 – 2020-04-09 (×6): 15 mL via OROMUCOSAL

## 2020-04-07 MED ORDER — CHLORHEXIDINE GLUCONATE 0.12 % MT SOLN
15.0000 mL | Freq: Two times a day (BID) | OROMUCOSAL | Status: DC
  Administered 2020-04-07 – 2020-04-09 (×5): 15 mL via OROMUCOSAL
  Filled 2020-04-07 (×4): qty 15

## 2020-04-07 MED ORDER — OXYCODONE HCL 5 MG PO TABS
5.0000 mg | ORAL_TABLET | Freq: Four times a day (QID) | ORAL | Status: DC | PRN
  Administered 2020-04-07: 5 mg via ORAL
  Filled 2020-04-07: qty 1

## 2020-04-07 NOTE — Progress Notes (Signed)
SLP Cancellation Note  Patient Details Name: Ryan Contreras MRN: [id number] DOB: Dec 30, 1940   Cancelled treatment:       Reason Eval/Treat Not Completed: Medical issues which prohibited therapy(note overnight agitation, oncology encouraging DNR and change to comfort.)  Kathleen Lime, MS [facility] SLP Acute Rehab Services Office [phone]  Macario Golds 04/07/2020, 2:08 PM

## 2020-04-07 NOTE — Progress Notes (Signed)
   04/07/20 1500  Clinical Encounter Type  Visited With Family;Patient not available  Visit Type Initial;Psychological support;Spiritual support;Critical Care  Referral From Nurse  Consult/Referral To Chaplain  Spiritual Encounters  Spiritual Needs Other (Comment) (Advance Directive/Healthcare Power of Attorney )  Stress Factors  Patient Stress Factors Not reviewed  Family Stress Factors Health changes;Other (Comment) (Advance Directive/Healthcare Power of Attorney )  Advance Directives (For Healthcare)  Does Patient Have a Medical Advance Directive? No  Would patient like information on creating a medical advance directive? No - Patient declined   I visited with Mr. Langlois family. His sister and step-daughter were at the bedside and his daughter was on the phone. They were interested in "Power of attorney" paperwork. After discussion, they stated that they were wanting to get durable power of attorney so that they could take "care of his affairs."I explained that we aren't able to help them with any other documents other than a healthcare Advance Directive. Mr. Scholze was asleep during my visit and I was unable to speak with him about his wishes on the subject.  I provided the family with education about an Forensic scientist.  Please, contact Spiritual Care for further assistance.   Chaplain Shanon Ace M.Div., [facility]

## 2020-04-07 NOTE — Progress Notes (Signed)
Pt has been refusing bipap today

## 2020-04-07 NOTE — Progress Notes (Signed)
Daily Progress Note   Patient Name: Ryan Contreras       Date: 04/07/2020 DOB: May 27, 1941  Age: 79 y.o. MRN#: [id number] Attending Physician: Kerney Elbe, DO Primary Care Physician: Cyndi Bender, PA-C Admit Date: 04/01/2020  Reason for Consultation/Follow-up: Establishing goals of care  Subjective:  patient has just received Ativan, he is resting in bed. Sitter at bedside, sister and step daughter also at bedside. Discussed with med onc colleague Erasmo Downer earlier today. See below.   Length of Stay: 6  Current Medications: Scheduled Meds:  . allopurinol  100 mg Oral Daily  . arformoterol  15 mcg Nebulization BID  . aspirin EC  81 mg Oral QHS  . atorvastatin  80 mg Oral QHS  . brimonidine  1 drop Both Eyes BID  . budesonide (PULMICORT) nebulizer solution  0.25 mg Nebulization BID  . chlorhexidine  15 mL Mouth Rinse BID  . Chlorhexidine Gluconate Cloth  6 each Topical Daily  . clopidogrel  75 mg Oral Daily  . docusate sodium  100 mg Oral Daily  . enoxaparin (LOVENOX) injection  40 mg Subcutaneous Q24H  . feeding supplement  1 Container Oral Q24H  . feeding supplement (ENSURE ENLIVE)  237 mL Oral BID BM  . ipratropium  0.5 mg Nebulization TID  . levalbuterol  0.63 mg Nebulization TID  . mouth rinse  15 mL Mouth Rinse q12n4p  . methylPREDNISolone (SOLU-MEDROL) injection  40 mg Intravenous Q24H  . metoprolol succinate  25 mg Oral Daily  . multivitamin with minerals  1 tablet Oral Daily  . pantoprazole  40 mg Oral Daily  . sodium chloride flush  10-40 mL Intracatheter Q12H  . tamsulosin  0.4 mg Oral Daily    Continuous Infusions: . sodium chloride 500 mL (04/07/20 1230)  . ceFEPime (MAXIPIME) IV Stopped (04/07/20 1344)  . dexmedetomidine (PRECEDEX) IV infusion 0.4  mcg/kg/hr (04/07/20 1353)  . phenylephrine (NEO-SYNEPHRINE) Adult infusion Stopped (04/07/20 0855)    PRN Meds: sodium chloride, acetaminophen **OR** acetaminophen, albuterol, haloperidol lactate, hydrALAZINE, hydrOXYzine, lip balm, LORazepam, morphine injection, ondansetron **OR** ondansetron (ZOFRAN) IV, oxyCODONE, polyethylene glycol, prochlorperazine, sodium chloride flush  Physical Exam         Appears chronically ill Has coarse breath sounds S1 S2 muffled by adventitious lung sounds Abdomen soft not tender Has some edema Sedated  at the moment.  Protuberant lower lip  Vital Signs: BP 128/60   Pulse (!) 113   Temp 98.1 F (36.7 C)   Resp 18   Ht 6\' 1"  (1.854 m)   Wt 102 kg   SpO2 97%   BMI 29.67 kg/m  SpO2: SpO2: 97 % O2 Device: O2 Device: Nasal Cannula O2 Flow Rate: O2 Flow Rate (L/min): 3 L/min  Intake/output summary:   Intake/Output Summary (Last 24 hours) at 04/07/2020 1523 Last data filed at 04/07/2020 1336 Gross per 24 hour  Intake 1141.18 ml  Output 3325 ml  Net -2183.82 ml   LBM: Last BM Date: (pt can not remember) Baseline Weight: Weight: 102 kg Most recent weight: Weight: 102 kg       Palliative Assessment/Data:    Flowsheet Rows     Most Recent Value  Intake Tab  Referral Department  Hospitalist  Unit at Time of Referral  ER  Palliative Care Primary Diagnosis  Cancer  Date Notified  04/01/20  Palliative Care Type  New Palliative care  Reason for referral  Clarify Goals of Care  Date of Admission  04/01/20  Date first seen by Palliative Care  04/01/20  # of days Palliative referral response time  0 Day(s)  # of days IP prior to Palliative referral  0  Clinical Assessment  Psychosocial & Spiritual Assessment  Palliative Care Outcomes      Patient Active Problem List   Diagnosis Date Noted  . Acute on chronic respiratory failure ([place]) 04/01/2020  . Tremor 03/31/2020  . Constipation 03/31/2020  . Allergic rhinitis 03/17/2020  . Primary  malignant neoplasm of right lung metastatic to other site ([place]) 03/03/2020  . Encounter for antineoplastic immunotherapy 03/03/2020  . Goals of care, counseling/discussion 03/03/2020  . Acute on chronic respiratory failure with hypoxia and hypercapnia ([place]) 02/29/2020  . COPD with acute exacerbation ([place]) 02/29/2020  . Chronic respiratory failure with hypoxia ([place]) 02/18/2020  . History of colonic polyps   . Benign neoplasm of transverse colon   . 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 Memory loss 06/26/2017  . Hypercapnic respiratory failure ([place]) 11/22/2016  . GI bleed 05/02/2016  . Anemia 05/02/2016  . Gastrointestinal bleeding, upper 04/29/2016  . Encounter for antineoplastic chemotherapy 02/15/2016  . Prostatitis, chronic 01/23/2016  . Malignant neoplasm of right upper lobe of lung ([place]) 01/21/2016  . Hilar adenopathy 01/11/2016  . Enlargement of lymph nodes   . Other nonspecific abnormal finding of lung field   . Pulmonary nodules 11/26/2015  . COPD  GOLD C 11/02/2015  . Cigarette smoker 11/02/2015  . Dyspnea 09/11/2015  . Edema 08/14/2015  . Diabetes ([place]) 04/13/2015  . Wart viral 01/30/2015  . Wellness examination 11/14/2014  . Neoplasm of uncertain behavior of skin 12/31/2012  . Bruise 07/21/2012  . Otitis media 04/19/2012  . Otitis externa 04/19/2012  . Cough 01/06/2012  . Encounter for long-term (current) use of other medications 12/16/2011  . PSA, INCREASED 08/11/2010  . SKIN RASH 07/14/2010  . TOBACCO ABUSE 03/15/2010  . CAD, NATIVE VESSEL 03/15/2010  . OTHER MALAISE AND FATIGUE 03/15/2010  . OTHER SPEC HYPERTROPHIC&ATROPHIC CONDITION SKIN 10/01/2009  . HYPOKALEMIA 06/03/2009  . Hyperlipidemia 05/14/2008  . Essential hypertension 05/14/2008  . Coronary atherosclerosis 05/14/2008  . PUD (peptic ulcer disease) 05/14/2008  . LEG PAIN 05/14/2008  . Gout 01/21/2008    Palliative Care Assessment & Plan   Patient Profile:     Assessment: 79  yo gentleman who lives at home with his daughter, has life limiting incurable illness of lung cancer that has been further complicated by postobstructive pneumonia with continued episodes of hypercapnia and confusion.     Recommendations/Plan:  Goals of care discussions are ongoing. Our recommendations remain for consideration for DNR DNI and home with hospice versus residential hospice. Gently reviewed this again with sister and step daughter at bedside. Acknowledged the serious nature of this discussion. Offered active listening and supportive care. Allow for space and reflection for the family to make this decision.    Code Status:    Code Status Orders  (From admission, onward)         Start     Ordered   04/01/20 1036  Full code  Continuous     04/01/20 1040        Code Status History    Date Active Date Inactive Code Status Order ID Comments User Context   02/29/2020 0501 03/01/2020 2235 Full Code [id number]  Vianne Bulls, MD ED   Advance Care Planning Activity       Prognosis:   guarded.   Discharge Planning:  To Be Determined  Care plan was discussed with  Sister, step daughter and oncology.   Thank you for allowing the Palliative Medicine Team to assist in the care of this patient.   Time In: 1400 Time Out: 1425 Total Time 25 Prolonged Time Billed No       Greater than 50%  of this time was spent counseling and coordinating care related to the above assessment and plan.  Loistine Chance, MD  Please contact Palliative Medicine Team phone at [phone] for questions and concerns.

## 2020-04-07 NOTE — Progress Notes (Signed)
PROGRESS NOTE    Authur Cubit Rossini  RFF:[id number] DOB: 1941/10/20 DOA: 04/01/2020 PCP: Cyndi Bender, PA-C   Brief Narrative: HPI per Dr. Theda Belfast on 04/01/20 Ryan Contreras is a 79 y.o. 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  Patient is a poor historian and does not provide accurate history.  I have reviewed patient's medical records and communicated with Dr. Julien Nordmann and Dr. Kyung Rudd as well.  Patient had his second cycle of current chemotherapy on 03/31/2020 and presented today to the ED with worsening shortness of breath.  At baseline, he wears 2 to 3 L of oxygen at home.  Patient complains of progressively worsening shortness of breath even with minimal exertion.  He denies fevers or worsening cough.  No chest pain.  Denies nausea, vomiting, diarrhea, abdominal pain, dysuria.  He does report decreased p.o. intake.  No known Covid exposures.  ED Course: He required 4 to 5 L oxygen via nasal cannula in the ED; his oxygen saturation was found to be 81% by EMS.  Chest x-ray showed possible worsening of mass.  CT of the chest was negative for PE but showed worsening of his cancer along with worsening of right upper lobe collapse and new right middle lobe collapse.  ED provider updated Dr. Julien Nordmann.  Hospitalist service was called to evaluate the patient.  **Interim History Has been intermittently using BiPAP given his somnolence and hypercarbia but is becoming agitated wearing it.  SLP evaluated and recommending dysphagia 3 diet.  PT OT recommending SNF.  Pulmonary and medical oncology evaluated recommending IV antibiotics and steroids asnd we  have been weaning steroids.  Palliative care medicine was consulted for goals of care discussion and will continue full scope of treatment currently while we continue palliative care discussions and the plan is for another family care discussion in the a.m.  Oncology reportedly stated the patient is no longer a candidate for chemotherapy.  Family still wants aggressive measures and continued scope of treatment for now but want further discuss the patient's case with Dr. Julien Nordmann.  Patient continues to be agitated and now requires a sitter and has been given Haldol but this is likely in part to his steroids.  Blood count yesterday morning was spuriously low but was repeated and is okay.  He continues to intermittently wear the BiPAP and has been little bit more tolerant.  Has been started on hydroxyzine and Ativan.  Continues to have hypercapnia.  Also now with a sitter.  Also started getting alkalosis so we have started Diamox.  04/06/2020 Overnight he had worsening agitation and ended up getting put on Precedex which dropped his blood pressure and then had short-term Neo-Synephrine placed on.  He received Ativan early this morning and was on the BiPAP and he was asleep when I saw him.  Medical oncology continues to recommend hospice at this point and feel that chemotherapy has no further benefit.  Medical oncology met with the family and reinforced these recommendations and family to discuss care about the patient and his CODE STATUS amongst themselves.  04/07/2020 Again overnight he ended up having significant agitation and end of on Precedex was dropped his blood pressures again need to be placed  on Neo-Synephrine again short-term.  This morning he again received Ativan earlier is monitor his agitation he is on BiPAP cannot I saw him.  Medical oncology continues to recommend hospice at this point and they discussed the CODE STATUS with the patient's family and the patient family still undecided about this at  about further care.  Currently he remains a full code and will have full scope of care.  Medical oncology and palliative care continue to discuss with the family about the patient's prognosis as well as further treatment.  Per medical oncology PA the patient's family seems to understand that hemotherapy for his cancer would not be beneficial.  Family still does not want the patient to be changed to DNR just yet.  Assessment & Plan:   Principal Problem:   Acute on chronic respiratory failure (HCC) Active Problems:   Hyperlipidemia   Gout   Essential hypertension   CAD, NATIVE VESSEL   COPD with acute exacerbation (HCC)   Primary malignant neoplasm of right lung metastatic to other site [facility])  Acute on Chronic hypoxic respiratory failure quiring noninvasive positive pressure ventilation with BiPAP intermittently COPD exacerbation Recurrent non-small cell lung cancer currently on chemotherapy causing right upper lobe and right middle lobe collapse/consolidation Probable postobstructive pneumonia -Patient normally wears 2 to 3 L oxygen by nasal cannula at home.  Currently requiring 4 to 5 L oxygen and then had to be placed on BiPAP and has intermittently when wearing it -Had another ABG done yesterday afternoon as nursing stated he wa more confused and somnolent and showed a pH of 7.328, PCO2 of 91.6, PO2 of 123, bicarbonate level 46.7, ABG O2 saturation 97.6-CT of the chest showing right upper lobe and right middle lobe collapse/consolidation with possibility of postobstructive pneumonia -Patient was started on Solu-Medrol in the ED.  Will continue Solu-Medrol 60 mg IV every 8 hours and went to every 12 hours yesterday and will decrease to 40 mg every 12 and hopefully transition to 40 mg daily yesterday and then to p.o. next few days if the goals of care are continuing to be aggressive -Patient this morning was extremely somnolent and received IV Ativan. -Because of his agitation last night he had  to be placed on a Precedex drip which then dropped his blood pressure so then subsequently he was placed on a Neo-Synephrine drip. -Continue Trelegy.  Continue nebs.  Incentive spirometry/flutter valve -We will use broad-spectrum antibiotics cefepime/vancomycin for now.  Also will order blood cultures -Palliative Care consultation for goals of care discussion.  I have notified Dr. Mohamed/oncology about the patient: Patient requests evaluation by Dr. Julien Nordmann while inpatient; unclear if Dr. Earlie Server has seen the patient or not  but reportedly he had called the family the day before yesterday and stated the patient is no longer a candidate for chemotherapy -Patient's family wanted to speak with Dr. Earlie Server in person as they have several oncology questions that they want answered; the medical oncology PA came by and reinforced the recommendations of hospice and no further chemotherapy.  Patient's family are to discuss among themselves as well as discussion about the patient's CODE STATUS still not come to the conclusion yet.  Medical oncology still continues to reinforce that chemotherapy versus cancer would not be beneficial and again expressed recommendations to transition to comfort measures and hospice changes CODE STATUS to DNR however family has not done this yet and still wants to discuss this. -Pulmonary was consulted regarding the patient who recommends continuing broad-spectrum antibiotics and  steroids as well as continuing supplemental oxygen via nasal cannula, aggressive pulmonary hygiene and following intermittent chest x-ray and ABG and have signed off the case -SLP evaluation recommending a dysphagia 3 diet -Medical oncology recommends continuing IV antibiotics and Solu-Medrol and if the patient's status continues deteriorate there agreement of pursuing palliative care and hospice for this patient; apparently Dr. Earlie Server has recommended normal chemotherapy and family had several  questions -Patient had gotten a little bit more alkalotic with a CO2 of 46, anion gap is 9; the patient was started on Diamox 500 g p.o. daily and now his CO2 is 37 and anion gap is -Because he had extravasation of contrast on the right upper extremity and he is undergoing the extravasation protocol -Continue to monitor respiratory status carefully and repeat chest x-ray in the a.m. -dietitian consulted and recommending boost breeze once a day and Ensure Enlive p.o. twice daily as well as a daily multivitamin -PT OT recommending skilled nursing facility -Palliative care was consulted for further goals of care treatment and they discussed with medical oncology today and the plan is to continue to follow his clinical course with treatment of his pneumonia and continue full scope of treatment as well as continue conversation regarding long-term goals of care based on his clinical course for the duration of his admission; another family meeting is set up for tomorrow and will see if Dr. Earlie Server can come by Monday to discuss and answer the patient's family's questions and concerns -The patient was hopeful to pursue disease modifying therapy for his lung cancer but unsure if this is feasible as medical oncology has reportedly recommended normal chemotherapy to the patient's family -Patient's clinical status is tenuous and he continues to remain on intermittent BiPAP.  We will continue aggressive treatment as above and continue BiPAP if he tolerates but he likely continues to be agitated and so we have given him some Haldol; the agitation is in part slightly secondary to his steroids -Also continues to have some dyspnea so we will start low-dose morphine IV 1 mg daily as needed for pain as well as dyspnea; because he continues to be somewhat agitated we will also add some Ativan 1 mg every 3 hours as needed and also added hydroxyzine  -Despite the measures as above the patient became extremely agitated and hd  been placed on a Precedex drip again short-term which then dropped his blood pressure; then temporarily he had to be placed on Neo-Synephrine and he is off of this now again this morning.  Now he does has as needed's and was given IV Ativan this morning and was extremely somnolent when I saw him and sleeping on the BiPAP in no acute distress -Continue monitor respiratory status carefully and also continue repeat chest x-ray in a.m.  Leukocytosis -Probably from above is trending down now.  -WBC went from 22.2 and is now trended down to 8.1 -Continue to monitor and trend and repeat CBC in a.m.  Chronic Macrocytic Anemia -Patient's hemoglobin/hematocrit went from 11.7/40.5 and is now at 9.3/3.3 -MCV was 100 -Continue monitor for signs and symptoms of bleeding; currently no overt bleeding on  -repeat CBC in a.m.  Hypertension -Continue to Monitor BP carefully.   -Continue metoprolol for now.  -Eplerenone held for now. -Last BP was 140/57 -Received Neo-Synephrine again last night after his blood pressure dropped after the Precedex drip was again started for his agitation  Hyperkalemia -Mild date was 5.6 and improved to 4.5 this morning -Continue to Hold  Potassium Supplementation -Continue to Monitor and Trend -Currently holding Eplerenone  -Because it remains elevated we will start Lokelma 10 mg p.o. daily and have stopped it  Yesterday  -Repeat CMP in AM   History of Coronary Artery Disease -No chest pain.   -Continue ASA 81 mg po Daily, Clopidogrel 75 mg po Daily, Atrovastatin 80 mg po qHS as well as beta-blocker with Metoprolol Succinate 25 mg po Daily.  -Outpatient follow-up with cardiology if he improves enough to get discharged  Hyperlipidemia -Continue Atorvastatin 80 mg po qHS  Gout -Continue Allopurinol 100 mg po Daily   Generalized deconditioning -Overall prognosis is guarded to poor.   -Palliative care consultation and appreciate continued goals of care  discussion and will be continuing palliative care and family discussions but family wants to Speak with Oncology; medical oncology came by again today specifically the PA and they recommend hospice again and no further chemotherapy -PT evaluation done and they are recommending SNF so we will need to consult transition of care to assist with placement if the patient wants to pursue SNF -Family to continually discuss about the patient's CODE STATUS and the recommendation for hospice but have not come to a definitive conclusion.  Palliative care and medical oncology involved and made the recommendation to transition the patient to comfort care and change his CODE STATUS to DNR.  Family still is undecided about this and still wants to discuss this with themselves  DVT prophylaxis: Enoxaparin 40 mg sq q24h Code Status: FULL CODE  Family Communication: No family present at bedside this AM  Disposition Plan: Patient is from home now requiring SNF level of care and will need to have improvement of his respiratory status prior to safe discharge; family discussing his CODE STATUS and possible discharge disposition and medical oncology has now recommended hospice but family has not accepted this yet  Status is: Inpatient  Remains inpatient appropriate because:Inpatient level of care appropriate due to severity of illness  Dispo: The patient is from: Home              Anticipated d/c is to: SNF if this is decided by family versus hospice              Anticipated d/c date is: 2 days              Patient currently is not medically stable to d/c.  Consultants:   Medical Oncology  Pulmonary Critical Care  Interventional Radiology    Procedures: None   Antimicrobials:  Anti-infectives (From admission, onward)   Start     Dose/Rate Route Frequency Ordered Stop   04/02/20 1800  vancomycin (VANCOREADY) IVPB 1750 mg/350 mL  Status:  Discontinued     1,750 mg 175 mL/hr over 120 Minutes Intravenous Every  24 hours 04/01/20 1125 04/02/20 1731   04/02/20 1000  azithromycin (ZITHROMAX) tablet 500 mg  Status:  Discontinued     500 mg Oral Daily 04/01/20 1040 04/01/20 1103   04/01/20 1200  vancomycin (VANCOREADY) IVPB 2000 mg/400 mL     2,000 mg 200 mL/hr over 120 Minutes Intravenous  Once 04/01/20 1118 04/01/20 2004   04/01/20 1200  ceFEPIme (MAXIPIME) 2 g in sodium chloride 0.9 % 100 mL IVPB     2 g 200 mL/hr over 30 Minutes Intravenous Every 8 hours 04/01/20 1118     04/01/20 1100  azithromycin (ZITHROMAX) 500 mg in sodium chloride 0.9 % 250 mL IVPB  Status:  Discontinued  500 mg 250 mL/hr over 60 Minutes Intravenous Every 24 hours 04/01/20 1040 04/01/20 1103     Subjective: Seen and examined at bedside and he again is more somnolent this morning had just received IV Ativan earlier.  Is currently on the BiPAP without issues and sitters again at bedside.  No family at bedside this morning.  No other concerns or complaints at this time.  Objective: Vitals:   04/07/20 1630 04/07/20 1700 04/07/20 1730 04/07/20 1735  BP: 120/65 131/65  (!) 140/57  Pulse: 93 94 (!) 110 (!) 107  Resp: '16 14 19 15  ' Temp:      TempSrc:      SpO2: 100% 100% 99% 93%  Weight:      Height:        Intake/Output Summary (Last 24 hours) at 04/07/2020 1930 Last data filed at 04/07/2020 1900 Gross per 24 hour  Intake 1064.86 ml  Output 2200 ml  Net -1135.14 ml   Filed Weights   04/01/20 1638  Weight: 102 kg   Examination: Physical Exam:  Constitutional: The patient is an overweight chronically ill-appearing African-American male who is currently in no acute distress nasal BiPAP to sleep as he had just received Ativan., NAD and appears calm and comfortable Eyes: Lids and conjunctivae normal, sclerae anicteric  ENMT: External Ears, Nose appear normal.   Neck: Appears normal, supple, no cervical masses, normal ROM, no appreciable thyromegaly; no JVD Respiratory: Diminished to auscultation bilaterally with  coarse breath sounds and some rhonchi and crackles.  Currently on the BiPAP and resting and has unlabored breathing Cardiovascular: Tachycardic rate but regular rhythm, no murmurs / rubs / gallops. S1 and S2 auscultated.  1+ lower extremity edema Abdomen: Soft, non-tender, distended secondary to body habitus.  Bowel sounds positive.  GU: Deferred. Musculoskeletal: No clubbing / cyanosis of digits/nails. No joint deformity upper and lower extremities.  Skin: No rashes, lesions, ulcers on limited skin evaluation. No induration; Warm and dry.  Neurologic: Again he is somnolent and does not follow commands.  Very drowsy Psychiatric: Did judgment and insight.  Currently not alert and oriented x 3.  affect.   Data Reviewed: I have personally reviewed following labs and imaging studies  CBC: Recent Labs  Lab 04/04/20 0725 04/04/20 1017 04/05/20 0500 04/06/20 0930 04/07/20 0306  WBC 9.8 12.5* 9.4 6.3 8.1  NEUTROABS 9.2* 12.0* 8.6* 5.2 6.3  HGB 7.1* 10.2* 9.4* 9.2* 9.3*  HCT 24.2* 33.7* 31.1* 30.7* 30.3*  MCV 104.8* 102.4* 98.7 101.7* 100.0  PLT 198 265 232 191 975   Basic Metabolic Panel: Recent Labs  Lab 04/03/20 0301 04/03/20 0301 04/04/20 0725 04/04/20 1017 04/05/20 0500 04/06/20 0930 04/07/20 0306  NA 137   < > 141 137 139 136 134*  K 5.6*   < > 3.5 4.9 4.2 4.9 4.5  CL 92*   < > 103 88* 84* 92* 90*  CO2 40*   < > 33* 40* 46* 39* 37*  GLUCOSE 171*   < > 116* 294* 128* 139* 110*  BUN 33*   < > 23 32* 34* 35* 37*  CREATININE 0.95   < > 0.43* 0.81 0.75 0.61 0.65  CALCIUM 8.2*   < > 6.3* 8.7* 8.6* 8.3* 8.9  MG 2.4  --  1.6*  --  2.1 2.0 2.0  PHOS 3.3  --  2.2*  --  3.9 3.9 3.3   < > = values in this interval not displayed.   GFR: Estimated Creatinine Clearance: 98.6  mL/min (by C-G formula based on SCr of 0.65 mg/dL). Liver Function Tests: Recent Labs  Lab 04/03/20 0301 04/04/20 0725 04/05/20 0500 04/06/20 0930 04/07/20 0306  AST '26 19 28 31 29  ' ALT '23 17 25 ' 33 33   ALKPHOS 78 49 62 56 64  BILITOT 0.3 0.4 0.7 0.9 1.0  PROT 7.3 4.8* 6.5 6.0* 6.4*  ALBUMIN 2.8* 2.0* 2.6* 2.5* 2.9*   No results for input(s): LIPASE, AMYLASE in the last 168 hours. No results for input(s): AMMONIA in the last 168 hours. Coagulation Profile: No results for input(s): INR, PROTIME in the last 168 hours. Cardiac Enzymes: No results for input(s): CKTOTAL, CKMB, CKMBINDEX, TROPONINI in the last 168 hours. BNP (last 3 results) No results for input(s): PROBNP in the last 8760 hours. HbA1C: No results for input(s): HGBA1C in the last 72 hours. CBG: Recent Labs  Lab 04/01/20 1535 04/06/20 0432  GLUCAP 113* 139*   Lipid Profile: No results for input(s): CHOL, HDL, LDLCALC, TRIG, CHOLHDL, LDLDIRECT in the last 72 hours. Thyroid Function Tests: No results for input(s): TSH, T4TOTAL, FREET4, T3FREE, THYROIDAB in the last 72 hours. Anemia Panel: No results for input(s): VITAMINB12, FOLATE, FERRITIN, TIBC, IRON, RETICCTPCT in the last 72 hours. Sepsis Labs: Recent Labs  Lab 04/01/20 0552 04/02/20 0304  PROCALCITON  --  <0.10  LATICACIDVEN 0.9  --     Recent Results (from the past 240 hour(s))  SARS CORONAVIRUS 2 (TAT 6-24 HRS) Nasopharyngeal Nasopharyngeal Swab     Status: None   Collection Time: 04/01/20  6:03 AM   Specimen: Nasopharyngeal Swab  Result Value Ref Range Status   SARS Coronavirus 2 NEGATIVE NEGATIVE Final    Comment: (NOTE) SARS-CoV-2 target nucleic acids are NOT DETECTED. The SARS-CoV-2 RNA is generally detectable in upper and lower respiratory specimens during the acute phase of infection. Negative results do not preclude SARS-CoV-2 infection, do not rule out co-infections with other pathogens, and should not be used as the sole basis for treatment or other patient management decisions. Negative results must be combined with clinical observations, patient history, and epidemiological information. The expected result is Negative. Fact Sheet for  Patients: SugarRoll.be Fact Sheet for Healthcare Providers: https://www.woods-mathews.com/ This test is not yet approved or cleared by the [place] FDA and  has been authorized for detection and/or diagnosis of SARS-CoV-2 by FDA under an Emergency Use Authorization (EUA). This EUA will remain  in effect (meaning this test can be used) for the duration of the COVID-19 declaration under Section 56 4(b)(1) of the Act, 21 U.S.C. section 360bbb-3(b)(1), unless the authorization is terminated or revoked sooner. Performed at [facility], [place] [address] [place], [place] [address]   MRSA culture     Status: None   Collection Time: 04/01/20 11:49 AM   Specimen: Body Fluid  Result Value Ref Range Status   Specimen Description   Final    NASAL SWAB Performed at [place] [place] [address], [place], [place] [address]    Special Requests   Final    NONE Performed at [facility], [place] [address], [place], [place] [address]    Culture   Final    NO GROWTH Performed at [facility], [place] [address] [place], [place] [address]    Report Status 04/02/2020 FINAL  Final  Culture, blood (routine x 2)     Status: None   Collection Time: 04/01/20 11:49 AM   Specimen: BLOOD  Result Value  Ref Range Status   Specimen Description   Final    BLOOD PORTA CATH LEFT Performed at [facility], [address] [place], [place] [address]    Special Requests   Final    BOTTLES DRAWN AEROBIC AND ANAEROBIC Blood Culture adequate volume Performed at [place] [address] [place], [place] [address]    Culture   Final    NO GROWTH 5 DAYS Performed at [facility], [place] [address], [place], [place] [address]    Report Status 04/06/2020 FINAL  Final  Culture, blood (routine x 2)     Status: None   Collection Time: 04/01/20 11:49 AM   Specimen: BLOOD  Result Value Ref Range  Status   Specimen Description   Final    BLOOD PORTA CATH LEFT Performed at [facility], [place] [address] [place], [place] [address]    Special Requests   Final    BOTTLES DRAWN AEROBIC AND ANAEROBIC Blood Culture adequate volume Performed at [place] [address] [place], [place] [address]    Culture   Final    NO GROWTH 5 DAYS Performed at [facility], [place] [address], [place], [place] [address]    Report Status 04/06/2020 FINAL  Final  MRSA PCR Screening     Status: None   Collection Time: 04/01/20  6:00 PM   Specimen: Nasopharyngeal  Result Value Ref Range Status   MRSA by PCR NEGATIVE NEGATIVE Final    Comment:        The GeneXpert MRSA Assay (FDA approved for NASAL specimens only), is one component of a comprehensive MRSA colonization surveillance program. It is not intended to diagnose MRSA infection nor to guide or monitor treatment for MRSA infections. Performed at [facility], [place] [address] [place], [place] [address]      RN Pressure Injury Documentation:     Estimated body mass index is 29.67 kg/m as calculated from the following:   Height as of this encounter: '6\' 1"'  (1.854 m).   Weight as of this encounter: 102 kg.  Malnutrition Type:  Nutrition Problem: Increased nutrient needs Etiology: acute illness, chronic illness, cancer and cancer related treatments   Malnutrition Characteristics:  Signs/Symptoms: estimated needs   Nutrition Interventions:  Interventions: Ensure Enlive (each supplement provides 350kcal and 20 grams of protein), MVI, Boost Breeze Radiology Studies: DG CHEST PORT 1 VIEW  Result Date: 04/07/2020 CLINICAL DATA:  Shortness of breath EXAM: PORTABLE CHEST 1 VIEW COMPARISON:  Yesterday FINDINGS: Left-sided port with tip at the upper cavoatrial junction. Volume loss and hazy opacity in the right mid lung where there has been lung cancer treatment. The left chest is clear.  Normal heart size. No visible effusion or pneumothorax. IMPRESSION: Stable right perihilar opacity and volume loss. Electronically Signed   By: Monte Fantasia M.D.   On: 04/07/2020 08:57   DG CHEST PORT 1 VIEW  Result Date: 04/06/2020 CLINICAL DATA:  Shortness of breath EXAM: PORTABLE CHEST 1 VIEW COMPARISON:  Yesterday FINDINGS: Known right upper lobe mass with adjacent collapse/consolidation as evaluated by recent CT. There is no edema, consolidation, effusion, or pneumothorax. Normal heart size and stable post treatment distortion of mediastinal contours. Left-sided port with tip in good position. Stable heart size. IMPRESSION: Stable post treatment appearance of the chest. Electronically Signed   By: Monte Fantasia M.D.   On: 04/06/2020 05:37   Scheduled Meds:  allopurinol  100  mg Oral Daily   arformoterol  15 mcg Nebulization BID   aspirin EC  81 mg Oral QHS   atorvastatin  80 mg Oral QHS   brimonidine  1 drop Both Eyes BID   budesonide (PULMICORT) nebulizer solution  0.25 mg Nebulization BID   chlorhexidine  15 mL Mouth Rinse BID   Chlorhexidine Gluconate Cloth  6 each Topical Daily   clopidogrel  75 mg Oral Daily   docusate sodium  100 mg Oral Daily   enoxaparin (LOVENOX) injection  40 mg Subcutaneous Q24H   feeding supplement  1 Container Oral Q24H   feeding supplement (ENSURE ENLIVE)  237 mL Oral BID BM   ipratropium  0.5 mg Nebulization TID   levalbuterol  0.63 mg Nebulization TID   mouth rinse  15 mL Mouth Rinse q12n4p   methylPREDNISolone (SOLU-MEDROL) injection  40 mg Intravenous Q24H   metoprolol succinate  25 mg Oral Daily   multivitamin with minerals  1 tablet Oral Daily   pantoprazole  40 mg Oral Daily   sodium chloride flush  10-40 mL Intracatheter Q12H   tamsulosin  0.4 mg Oral Daily   Continuous Infusions:  sodium chloride 500 mL (04/07/20 1230)   ceFEPime (MAXIPIME) IV Stopped (04/07/20 1344)   dexmedetomidine (PRECEDEX) IV infusion  Stopped (04/07/20 1535)   phenylephrine (NEO-SYNEPHRINE) Adult infusion 35 mcg/min (04/07/20 1742)    LOS: 6 days   Kerney Elbe, DO [facility] PAGER is on AMION  If 7PM-7AM, please contact night-coverage [email or web address]

## 2020-04-07 NOTE — Progress Notes (Signed)
Patient's agitation continued overnight despite haldol, ativan, and atarax. Patient frequently pulling off bipap, easily reoriented, but constantly pulling at lines in bed even with sitter at bedside. elink notified of continued agitation, precedex ordered and started at 0.4 at 0333. Unfortunately, precedex titration was not in the scope of practice of the primary nurse, so I took over the precedex drip management and educated her on possible side effects. Primary nurse was instructed that I would be close by, but if I had to leave the floor and she noticed a drop in heart rate or blood pressure she should call me to further manage the drip. Blood pressure maintained for some time, but at 0421 blood pressure cycled back and 67/37 (46) and a pulse of 98. Entered room to arouse patient to see if the blood pressure would rise with stimulation and found patient to be completely unarousable with deep sternal rub. Elink notified, 500 cc normal saline bolus started, and precedex turned off. At 0428 blood pressure cycled back at 49/17 (27). Elink MD ordered neo which was overidden from the pyxis to start emergently. Neo started at approximately 0430 at a rate of 47mcg/minute. Patient started on 20 of neo along with normal saline bolus. Elink MD showed concern over the effects of prior ativan on patients blood pressure and order 1 dose of 0.5 of romazicon. Romazicon given and patient immediately began to wake up. Blood pressure at 0450 cycled back 152/85 (103) and a pulse of 87. Bolus and neo were both stopped with the patient fully recovered. Will defer treatment to primary nurse now who will continue to monitor.

## 2020-04-07 NOTE — Progress Notes (Signed)
Pt seen, found off bipap, on 3lnc.  Per RN, pt was very agitated and repeatedly ripped mask off.  RN working with precedex and will place pt back on bipap as tolerated.  [id number], rr18, spo2 100%.

## 2020-04-07 NOTE — Progress Notes (Signed)
OT Cancellation Note  Patient Details Name: Ryan Contreras MRN: [id number] DOB: 06-16-1941   Cancelled Treatment:     Reason Eval/Treat Not Completed: Medical issues which prohibited therapy, isues with pain control, BP.  Will check back as appropriate.  Delbert Phenix OT Pager: [phone] 04/07/2020, 9:05 AM

## 2020-04-07 NOTE — Progress Notes (Signed)
HEMATOLOGY-ONCOLOGY PROGRESS NOTE  SUBJECTIVE: Events overnight noted.  Briefly, the patient had worsening agitation overnight and was given multiple medications.  He was eventually started on Precedex as other medications were ineffective.  While on Precedex, the patient's blood pressure dropped significantly and Precedex was stopped.  Neo was started with improvement of his blood pressure.  At the time of visit, the patient's stepdaughter, [name] is at the bedside.  His daughter, [name], was available during my discussion on speaker phone.    The patient is able to wake up and converse minimally with [place].  He does not open his eyes.  I reviewed his CT scan again with him which showed disease progression and also discussed that additional chemotherapy would not be beneficial and recommended that he transition to comfort measures and hospice.  Additionally, CODE STATUS was discussed with the patient.  The patient seemed to minimally understand our conversation, however, it is not clear how much he truly understood.  He did say on several occasions to "let me go."  Oncology History Overview Note  Patient presented with abnormal LDCT screening exam 11/26/15  Non-small cell carcinoma of lung, stage 2 ([place])   Staging form: Lung, AJCC 7th Edition     Clinical stage from 01/21/2016: Stage IIA (T1b, N1, M0) - Signed by Curt Bears, MD on 01/23/2016     Malignant neoplasm of right upper lobe of lung ([place])  12/11/2015 Imaging   PET IMPRESSION: 2.7 cm spiculated hypermetabolic nodule in central right upper lobe, consistent with primary bronchogenic carcinoma.  Mild hypermetabolic right hilar lymphadenopathy, consistent with metastatic disease.  11 mm sub-solid left lower   01/11/2016 Pathology Results   BRONCHIAL BRUSHING(EBUS) RUL (SPECIMEN 4 OF 4 COLLECTED 01/11/16): NON SMALL CELL CARCINOMA. FINE NEEDLE ASPIRATION, ENDOSCOPIC (EBUS), RIGHT PERI-HILAR NODULE(SPECIMEN 1 OF 4 COLLECTED 01/11/16):  NON-SMALL CELL CARCINOMA.    01/11/2016 Surgery   Operation: Flexible video fiberoptic bronchoscopy with endobronchial ultrasound and biopsies.    01/21/2016 Initial Diagnosis   Non-small cell carcinoma of lung, stage 2 ([place])   01/26/2016 -  Radiation Therapy   SIM   02/01/2016 -  Chemotherapy   1st chemotherapy taxol/carbo   03/09/2020 -  Chemotherapy   The patient had palonosetron (ALOXI) injection 0.25 mg, 0.25 mg, Intravenous,  Once, 2 of 4 cycles Administration: 0.25 mg (03/09/2020), 0.25 mg (03/31/2020) pegfilgrastim-jmdb (FULPHILA) injection 6 mg, 6 mg, Subcutaneous,  Once, 2 of 4 cycles Administration: 6 mg (03/11/2020) CARBOplatin (PARAPLATIN) 580 mg in sodium chloride 0.9 % 250 mL chemo infusion, 580 mg (100 % of original dose 584 mg), Intravenous,  Once, 2 of 4 cycles Dose modification: 584 mg (original dose 584 mg, Cycle 1), 584 mg (original dose 584 mg, Cycle 3) Administration: 580 mg (03/09/2020), 580 mg (03/31/2020) fosaprepitant (EMEND) 150 mg in sodium chloride 0.9 % 145 mL IVPB, 150 mg, Intravenous,  Once, 2 of 4 cycles Administration: 150 mg (03/09/2020), 150 mg (03/31/2020) PACLitaxel (TAXOL) 402 mg in sodium chloride 0.9 % 500 mL chemo infusion (> 45m/m2), 175 mg/m2 = 402 mg (100 % of original dose 175 mg/m2), Intravenous,  Once, 2 of 4 cycles Dose modification: 175 mg/m2 (original dose 175 mg/m2, Cycle 1, Reason: Provider Judgment) Administration: 402 mg (03/09/2020), 402 mg (03/31/2020) pembrolizumab (KEYTRUDA) 200 mg in sodium chloride 0.9 % 50 mL chemo infusion, 200 mg, Intravenous, Once, 2 of 6 cycles Administration: 200 mg (03/09/2020), 200 mg (03/31/2020)  for chemotherapy treatment.    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[place])   03/09/2020 -  Chemotherapy   The patient had palonosetron (ALOXI) injection 0.25 mg, 0.25 mg, Intravenous,  Once, 2 of 4  cycles Administration: 0.25 mg (03/09/2020), 0.25 mg (03/31/2020) pegfilgrastim-jmdb (FULPHILA) injection 6 mg, 6 mg, Subcutaneous,  Once, 2 of 4 cycles Administration: 6 mg (03/11/2020) CARBOplatin (PARAPLATIN) 580 mg in sodium chloride 0.9 % 250 mL chemo infusion, 580 mg (100 % of original dose 584 mg), Intravenous,  Once, 2 of 4 cycles Dose modification: 584 mg (original dose 584 mg, Cycle 1), 584 mg (original dose 584 mg, Cycle 3) Administration: 580 mg (03/09/2020), 580 mg (03/31/2020) fosaprepitant (EMEND) 150 mg in sodium chloride 0.9 % 145 mL IVPB, 150 mg, Intravenous,  Once, 2 of 4 cycles Administration: 150 mg (03/09/2020), 150 mg (03/31/2020) PACLitaxel (TAXOL) 402 mg in sodium chloride 0.9 % 500 mL chemo infusion (> 67m/m2), 175 mg/m2 = 402 mg (100 % of original dose 175 mg/m2), Intravenous,  Once, 2 of 4 cycles Dose modification: 175 mg/m2 (original dose 175 mg/m2, Cycle 1, Reason: Provider Judgment) Administration: 402 mg (03/09/2020), 402 mg (03/31/2020) pembrolizumab (KEYTRUDA) 200 mg in sodium chloride 0.9 % 50 mL chemo infusion, 200 mg, Intravenous, Once, 2 of 6 cycles Administration: 200 mg (03/09/2020), 200 mg (03/31/2020)  for chemotherapy treatment.       REVIEW OF SYSTEMS:   Unable to obtain comprehensive review of systems secondary to patient condition  I have reviewed the past medical history, past surgical history, social history and family history with the patient and they are unchanged from previous note.   PHYSICAL EXAMINATION: ECOG PERFORMANCE STATUS: 4 - Bedbound  Vitals:   04/07/20 1100 04/07/20 1200  BP: (!) 90/56   Pulse: 95   Resp: 14   Temp:  98.1 F (36.7 C)  SpO2: 99%    Filed Weights   04/01/20 1638  Weight: 102 kg    Intake/Output from previous day: 04/26 0701 - 04/27 0700 In: 1011.2 [P.O.:220; I.V.:200.6; IV Piggyback:590.6] Out: 2725 [Urine:2725]  GENERAL: Chronically ill-appearing male, no distress LUNGS: Diminished breath sounds   HEART: regular rate & rhythm and no murmurs and no lower extremity edema ABDOMEN:abdomen soft, non-tender and normal bowel sounds Musculoskeletal:no cyanosis of digits and no clubbing  NEURO: Sedated  LABORATORY DATA:  I have reviewed the data as listed CMP Latest Ref Rng & Units 04/07/2020 04/06/2020 04/05/2020  Glucose 70 - 99 mg/dL 110(H) 139(H) 128(H)  BUN 8 - 23 mg/dL 37(H) 35(H) 34(H)  Creatinine 0.61 - 1.24 mg/dL 0.65 0.61 0.75  Sodium 135 - 145 mmol/L 134(L) 136 139  Potassium 3.5 - 5.1 mmol/L 4.5 4.9 4.2  Chloride 98 - 111 mmol/L 90(L) 92(L) 84(L)  CO2 22 - 32 mmol/L 37(H) 39(H) 46(H)  Calcium 8.9 - 10.3 mg/dL 8.9 8.3(L) 8.6(L)  Total Protein 6.5 - 8.1 g/dL 6.4(L) 6.0(L) 6.5  Total Bilirubin 0.3 - 1.2 mg/dL 1.0 0.9 0.7  Alkaline Phos 38 - 126 U/L 64 56 62  AST 15 - 41 U/L _0 ALT 0 - 44 U/L 33 33 25    Lab Results  Component Value Date   WBC 8.1 04/07/2020   HGB 9.3 (L) 04/07/2020   HCT 30.3 (L) 04/07/2020   MCV 100.0 04/07/2020   PLT 190 04/07/2020   NEUTROABS 6.3 04/07/2020    DG Chest 2 View  Result Date: 04/01/2020 CLINICAL DATA:  Worsening shortness  of breath. History of non-small cell lung cancer. EXAM: CHEST - 2 VIEW COMPARISON:  Chest x-ray 02/28/2020 and PET-CT 02/04/2020 FINDINGS: Stable right upper lobe lung mass and surrounding extensive radiation changes with loss of volume in the right hemithorax. Slight increase in surrounding right lung density which could reflect pneumonitis or interstitial spread of tumor. The left lung remains relatively clear. Left IJ power port in good position without complicating features. No pleural effusions or pneumothorax. IMPRESSION: 1. Stable right upper lobe lung mass and surrounding radiation changes. 2. Slight increase in surrounding right lung density which could reflect pneumonitis or interstitial spread of tumor. Electronically Signed   By: Marijo Sanes M.D.   On: 04/01/2020 06:32   CT HEAD WO CONTRAST  Result  Date: 04/01/2020 CLINICAL DATA:  Altered mental status. Lethargy. EXAM: CT HEAD WITHOUT CONTRAST TECHNIQUE: Contiguous axial images were obtained from the base of the skull through the vertex without intravenous contrast. COMPARISON:  Head MRI 02/04/2016 FINDINGS: Brain: The study is mildly to moderately motion degraded despite an attempt at repeat imaging. Within this limitation, no acute large territory infarct, intracranial hemorrhage, mass, midline shift, or extra-axial fluid collection is identified. The ventricles and sulci are normal. Hypodensities in the cerebral white matter bilaterally are nonspecific but compatible with mild chronic small vessel ischemic disease. Vascular: Calcified atherosclerosis at the skull base. Skull: No fracture or suspicious osseous lesion. Sinuses/Orbits: Paranasal sinuses and mastoid air cells are clear. Bilateral cataract extraction. Other: None. IMPRESSION: 1. Motion degraded examination without evidence of acute intracranial abnormality. 2. Mild chronic small vessel ischemic disease. Electronically Signed   By: Logan Bores M.D.   On: 04/01/2020 16:41   CT Angio Chest PE W and/or Wo Contrast  Result Date: 04/01/2020 CLINICAL DATA:  Hypoxemia. Shortness of breath. History of lung cancer. EXAM: CT ANGIOGRAPHY CHEST WITH CONTRAST TECHNIQUE: Multidetector CT imaging of the chest was performed using the standard protocol during bolus administration of intravenous contrast. Multiplanar CT image reconstructions and MIPs were obtained to evaluate the vascular anatomy. CONTRAST:  257m OMNIPAQUE IOHEXOL 350 MG/ML SOLN COMPARISON:  CT chest 01/20/2020.  PET-CT 02/04/2020. FINDINGS: Cardiovascular: The heart size is normal. No substantial pericardial effusion. Coronary artery calcification is evident. Atherosclerotic calcification is noted in the wall of the thoracic aorta. Left Port-A-Cath tip is positioned in the upper right atrium. No filling defects within the opacified  pulmonary arteries to suggest the presence of an acute pulmonary embolus. Mediastinum/Nodes: No mediastinal lymphadenopathy. 11 mm short axis left hilar lymph node appears progressive in the interval. Consolidative changes are noted in the parahilar right lung. There is no axillary lymphadenopathy. The esophagus has normal imaging features. Lungs/Pleura: Centrilobular and paraseptal emphysema evident. Right upper lobe pulmonary mass measures 3.4 x 2.4 cm today compared to 3.0 x 2.6 cm previously. The collapse/consolidative changes in the right upper lobe are similar to minimally progressed in the interval. Since the prior study the patient has developed collapse/consolidative change in the right middle lobe as well. No substantial pleural effusion. Upper Abdomen: Right subdiaphragmatic soft tissue lesion measures 5.6 cm today compared to 4.8 cm previously. Musculoskeletal: No worrisome lytic or sclerotic osseous abnormality. Cortical erosion with fracture of the anterior fourth rib is similar to prior. Midline subcutaneous cystic lesion in the anterior chest wall is stable, compatible with sebaceous cyst. Review of the MIP images confirms the above findings. IMPRESSION: 1. No CT evidence for acute pulmonary embolus. 2. Continued progression of the right upper lobe pulmonary mass with worsening  right upper lobe collapse/consolidation and interval development of complete collapse/consolidation of the right middle lobe. No substantial pleural effusion at this time. 3. Slight progression of the right subdiaphragmatic soft tissue lesion posterior to hepatic dome. This lesion was not hypermetabolic on previous PET-CT. Continued attention on follow-up recommended. 4. Mild progression of left hilar lymphadenopathy. 5.  Emphysema (ICD10-J43.9) and Aortic Atherosclerosis (ICD10-170.0) Electronically Signed   By: Misty Stanley M.D.   On: 04/01/2020 09:38   DG CHEST PORT 1 VIEW  Result Date: 04/07/2020 CLINICAL DATA:   Shortness of breath EXAM: PORTABLE CHEST 1 VIEW COMPARISON:  Yesterday FINDINGS: Left-sided port with tip at the upper cavoatrial junction. Volume loss and hazy opacity in the right mid lung where there has been lung cancer treatment. The left chest is clear. Normal heart size. No visible effusion or pneumothorax. IMPRESSION: Stable right perihilar opacity and volume loss. Electronically Signed   By: Monte Fantasia M.D.   On: 04/07/2020 08:57   DG CHEST PORT 1 VIEW  Result Date: 04/06/2020 CLINICAL DATA:  Shortness of breath EXAM: PORTABLE CHEST 1 VIEW COMPARISON:  Yesterday FINDINGS: Known right upper lobe mass with adjacent collapse/consolidation as evaluated by recent CT. There is no edema, consolidation, effusion, or pneumothorax. Normal heart size and stable post treatment distortion of mediastinal contours. Left-sided port with tip in good position. Stable heart size. IMPRESSION: Stable post treatment appearance of the chest. Electronically Signed   By: Monte Fantasia M.D.   On: 04/06/2020 05:37   DG CHEST PORT 1 VIEW  Result Date: 04/05/2020 CLINICAL DATA:  Shortness of breath EXAM: PORTABLE CHEST 1 VIEW COMPARISON:  04/04/2020 FINDINGS: LEFT-sided Port-A-Cath remains in place terminating at caval to atrial junction. Cardiomediastinal contours are stable with extensive post treatment changes in the RIGHT chest and middle lobe collapse with similar appearance. LEFT chest is clear. No sign of pleural effusion. No acute bone finding. IMPRESSION: 1. No acute cardiopulmonary abnormality. 2. Post treatment changes in the RIGHT chest with similar appearance to prior study. Essentially no change in the appearance of the chest compared to the prior exam. Electronically Signed   By: Zetta Bills M.D.   On: 04/05/2020 07:40   DG CHEST PORT 1 VIEW  Result Date: 04/04/2020 CLINICAL DATA:  79 year old male with history of shortness of breath. EXAM: PORTABLE CHEST 1 VIEW COMPARISON:  Chest x-ray 04/03/2020.  FINDINGS: Left-sided single-lumen internal jugular power porta cath with tip terminating at the superior cavoatrial junction. Known right upper lobe mass and associated tumor infiltration and atelectasis in the right upper lobe and right middle lobe appears grossly similar to the prior study resulting in a focal opacity projecting over the right mid lung. Improved aeration in the right lung base when compared to the prior study, suggesting resolving areas of airspace consolidation and/or atelectasis in the right lower lobe. Left lung is clear. No pleural effusions. No evidence of pulmonary edema. Heart size is mildly enlarged. IMPRESSION: 1. Improving aeration in the right lower lobe which likely reflects resolving atelectasis and/or consolidation. Otherwise, the radiographic appearance the chest is very similar to the prior study, as above. Electronically Signed   By: Vinnie Langton M.D.   On: 04/04/2020 06:46   DG CHEST PORT 1 VIEW  Result Date: 04/03/2020 CLINICAL DATA:  Shortness of breath. EXAM: PORTABLE CHEST 1 VIEW COMPARISON:  04/01/2020 chest x-ray and CT scan. FINDINGS: The left IJ power port is stable. The cardiac silhouette, mediastinal and hilar contours are unchanged. Stable right upper lobe  lung mass and surrounding radiation fibrosis. Overall slight worsening right lung aeration which could be due to surrounding pneumonitis or progressive atelectasis. The left lung remains clear. No pneumothorax. IMPRESSION: 1. Stable right upper lobe lung mass and surrounding radiation fibrosis. 2. Slight worsening right lung aeration possibly due to surrounding pneumonitis or progressive atelectasis. Electronically Signed   By: Marijo Sanes M.D.   On: 04/03/2020 07:06   IR IMAGING GUIDED PORT INSERTION  Result Date: 03/23/2020 INDICATION: History of recurrent lung cancer. In need durable intravenous access for chemotherapy administration. EXAM: IMPLANTED PORT A CATH PLACEMENT WITH ULTRASOUND AND  FLUOROSCOPIC GUIDANCE COMPARISON:  PET-CT-02/05/2020; chest CT-01/20/2020 MEDICATIONS: Ancef 2 gm IV; The antibiotic was administered within an appropriate time interval prior to skin puncture. ANESTHESIA/SEDATION: Moderate (conscious) sedation was employed during this procedure. A total of Versed 1 mg and Fentanyl 50 mcg was administered intravenously. Moderate Sedation Time: 40 minutes. The patient's level of consciousness and vital signs were monitored continuously by radiology nursing throughout the procedure under my direct supervision. CONTRAST:  None FLUOROSCOPY TIME:  36 seconds (19 mGy) COMPLICATIONS: None immediate. Note, patient was found have asymptomatic tachycardic prior to procedure. 12 lead EKG demonstrated sinus tachycardia and again, the patient was asymptomatic, specifically, no chest pain shortness of breath, with normal hemodynamic stability and as such the decision was made to proceed with definitive Port a catheter placement. PROCEDURE: The procedure, risks, benefits, and alternatives were explained to the patient. Questions regarding the procedure were encouraged and answered. The patient understands and consents to the procedure. Given presence right-sided lung cancer recurrence, decision was made to proceed with left internal jugular approach port a catheter placement. The left neck and chest were prepped with chlorhexidine in a sterile fashion, and a sterile drape was applied covering the operative field. Maximum barrier sterile technique with sterile gowns and gloves were used for the procedure. A timeout was performed prior to the initiation of the procedure. Local anesthesia was provided with 1% lidocaine with epinephrine. After creating a small venotomy incision, a micropuncture kit was utilized to access the internal jugular vein. Real-time ultrasound guidance was utilized for vascular access including the acquisition of a permanent ultrasound image documenting patency of the accessed  vessel. The microwire was utilized to measure appropriate catheter length. A subcutaneous port pocket was then created along the upper chest wall utilizing a combination of sharp and blunt dissection. The pocket was irrigated with sterile saline. A single lumen "ISP" sized power injectable port was chosen for placement. The 8 Fr catheter was tunneled from the port pocket site to the venotomy incision. The port was placed in the pocket. The external catheter was trimmed to appropriate length. At the venotomy, an 8 Fr peel-away sheath was placed over a guidewire under fluoroscopic guidance. The catheter was then placed through the sheath and the sheath was removed. Final catheter positioning was confirmed and documented with a fluoroscopic spot radiograph. The port was accessed with a Huber needle, aspirated and flushed with heparinized saline. The venotomy site was closed with an interrupted 4-0 Vicryl suture. The port pocket incision was closed with interrupted 2-0 Vicryl suture. The skin was opposed with a running subcuticular 4-0 Vicryl suture. Dermabond and Steri-strips were applied to both incisions. Dressings were applied. The patient tolerated the procedure well without immediate post procedural complication. FINDINGS: After catheter placement, the tip lies within the superior cavoatrial junction. The catheter aspirates and flushes normally and is ready for immediate use. IMPRESSION: Successful placement of a  left internal jugular approach power injectable Port-A-Cath. The catheter is ready for immediate use. Electronically Signed   By: Sandi Mariscal M.D.   On: 03/23/2020 16:09    ASSESSMENT AND PLAN: This is a pleasant 79 year old African-American male recurrent non-small cell lung cancer, squamous cell carcinoma presented initially as unresectable stage IIa non-small cell lung cancer, squamous cell carcinoma the right upper lobe lung nodule and hilar lymphadenopathy.  He was initially diagnosed in January  2017.  He is status post a course of concurrent chemoradiation.  He was on observation since April 2017.  More recently, he was found to have an enlarging soft tissue mass in the right upper lobe suspicious for disease recurrence.  He underwent repeat bronchoscopy with biopsy of the suspicious recurrent lung cancer under the care of Dr. Lamonte Sakai and the final pathology was consistent with a squamous cell carcinoma.  He is receiving systemic chemotherapy with carboplatin for an AUC of 5, paclitaxel 175 mg meter squared, and Keytruda 200 mg IV every 3 weeks.  He is status post 2 cycles of his treatment.  Last treatment was given on 03/31/2020.    CT angiogram of the chest performed on admission showed no evidence for PE but did show continued progression of the right upper lobe pulmonary mass with worsening right upper lobe collapse/consolidation as well as interval development of complete collapse/consolidation of the right middle lobe.  There was also slight progression of the right subdiaphragmatic soft tissue lesion posterior to the hepatic dome.  These results have been discussed with the patient as well as his daughter, [name], and his sister, Denyse Amass on 04/03/2020.  Medical oncology recommends for the patient to go on hospice at this point.  Additional chemotherapy would not be helpful in this situation.  I again discussed our recommendations and recommendation for changing CODE STATUS to DNR with the patient who seemed to only minimally participate in the discussion.  Additional detailed discussion was had with [name] and [name].  All questions were answered.  They seemed to understand that additional chemotherapy for his cancer would not be beneficial.  I again expressed her recommendations to transition to comfort measures and hospice and to change CODE STATUS to DNR.  The family wishes to continue to discuss this.  I have discussed my conversation with the palliative care team who will follow up with the  patient as well.   LOS: 6 days   [name], DNP, AGPCNP-BC, AOCNP 04/07/20

## 2020-04-07 NOTE — Progress Notes (Signed)
PT Cancellation Note  Patient Details Name: Ryan Contreras MRN: [id number] DOB: 1941-06-15   Cancelled Treatment:    Reason Eval/Treat Not Completed: Medical issues which prohibited therapy, isues with pain control, BP.  Will check back another time.   Claretha Cooper 04/07/2020, 7:14 AM  [place] Pager [phone] Office [phone]

## 2020-04-07 NOTE — Progress Notes (Signed)
Patient has had complaints of discomfort since start of shift. Pain and anxiety medication given as ordered at [place], but did not seem to have an affect. I tried ativan as ordered next at 2110, which also did not appear to change patient level of agitation. At 2140 due to increased agitation I gave the patient haldol as prescribed. I gave next dose of morphine when I was able according to orders at 2230. This appeared to be a little more affective for a longer period of time. Precedex was restarted at [place] as a last resort to calm patient, I had been told that it had lowered his blood pressure last night but that it was thought to be caused from a reversal agent given earlier in the night. Patient agitation decreased slightly, but precedex was unable to be raised more than 0.14mcg. At 0415 the patients blood pressure read 63/33 which was a dramatic jump from a previous reading at 103/60. I immediately stopped precedex infusion and started neo as prescribed at the lowest rate. Notified on call provider of BP drop and interventions implemented. BP quickly returned to 94/49 within 10 minutes without neo rate being increased. Will continue to assess patient.

## 2020-04-08 ENCOUNTER — Inpatient Hospital Stay (HOSPITAL_COMMUNITY): Payer: Medicare PPO

## 2020-04-08 DIAGNOSIS — J441 Chronic obstructive pulmonary disease with (acute) exacerbation: Secondary | ICD-10-CM

## 2020-04-08 DIAGNOSIS — R410 Disorientation, unspecified: Secondary | ICD-10-CM

## 2020-04-08 DIAGNOSIS — J9622 Acute and chronic respiratory failure with hypercapnia: Secondary | ICD-10-CM

## 2020-04-08 DIAGNOSIS — E785 Hyperlipidemia, unspecified: Secondary | ICD-10-CM | POA: Diagnosis not present

## 2020-04-08 LAB — COMPREHENSIVE METABOLIC PANEL
ALT: 28 U/L (ref 0–44)
AST: 21 U/L (ref 15–41)
Albumin: 2.4 g/dL — ABNORMAL LOW (ref 3.5–5.0)
Alkaline Phosphatase: 53 U/L (ref 38–126)
Anion gap: 4 — ABNORMAL LOW (ref 5–15)
BUN: 36 mg/dL — ABNORMAL HIGH (ref 8–23)
CO2: 34 mmol/L — ABNORMAL HIGH (ref 22–32)
Calcium: 8 mg/dL — ABNORMAL LOW (ref 8.9–10.3)
Chloride: 99 mmol/L (ref 98–111)
Creatinine, Ser: 0.55 mg/dL — ABNORMAL LOW (ref 0.61–1.24)
GFR calc Af Amer: >60 mL/min (ref >60)
GFR calc non Af Amer: >60 mL/min (ref >60)
Glucose, Bld: 94 mg/dL (ref 70–99)
Potassium: 3.6 mmol/L (ref 3.5–5.1)
Sodium: 137 mmol/L (ref 135–145)
Total Bilirubin: 0.8 mg/dL (ref 0.3–1.2)
Total Protein: 5.4 g/dL — ABNORMAL LOW (ref 6.5–8.1)

## 2020-04-08 LAB — CBC WITH DIFFERENTIAL/PLATELET
Abs Immature Granulocytes: 0.07 10*3/uL (ref 0.00–0.07)
Basophils Absolute: 0.1 10*3/uL (ref 0.0–0.1)
Basophils Relative: 1 %
Eosinophils Absolute: 0.1 10*3/uL (ref 0.0–0.5)
Eosinophils Relative: 1 %
HCT: 25.4 % — ABNORMAL LOW (ref 39.0–52.0)
Hemoglobin: 7.8 g/dL — ABNORMAL LOW (ref 13.0–17.0)
Immature Granulocytes: 1 %
Lymphocytes Relative: 11 %
Lymphs Abs: 0.8 10*3/uL (ref 0.7–4.0)
MCH: 30.8 pg (ref 26.0–34.0)
MCHC: 30.7 g/dL (ref 30.0–36.0)
MCV: 100.4 fL — ABNORMAL HIGH (ref 80.0–100.0)
Monocytes Absolute: 0.1 10*3/uL (ref 0.1–1.0)
Monocytes Relative: 1 %
Neutro Abs: 6.3 10*3/uL (ref 1.7–7.7)
Neutrophils Relative %: 85 %
Platelets: 123 10*3/uL — ABNORMAL LOW (ref 150–400)
RBC: 2.53 MIL/uL — ABNORMAL LOW (ref 4.22–5.81)
RDW: 15.3 % (ref 11.5–15.5)
WBC: 7.3 10*3/uL (ref 4.0–10.5)
nRBC: 0 % (ref 0.0–0.2)

## 2020-04-08 LAB — PHOSPHORUS: Phosphorus: 2.5 mg/dL (ref 2.5–4.6)

## 2020-04-08 LAB — MAGNESIUM: Magnesium: 1.6 mg/dL — ABNORMAL LOW (ref 1.7–2.4)

## 2020-04-08 MED ORDER — METOPROLOL SUCCINATE ER 25 MG PO TB24
12.5000 mg | ORAL_TABLET | Freq: Every day | ORAL | Status: DC
  Administered 2020-04-09: 10:00:00 12.5 mg via ORAL
  Filled 2020-04-08: qty 1

## 2020-04-08 MED ORDER — BUDESONIDE 0.5 MG/2ML IN SUSP
0.5000 mg | Freq: Two times a day (BID) | RESPIRATORY_TRACT | Status: DC
  Administered 2020-04-08 – 2020-04-09 (×2): 0.5 mg via RESPIRATORY_TRACT
  Filled 2020-04-08 (×2): qty 2

## 2020-04-08 MED ORDER — MAGNESIUM SULFATE 2 GM/50ML IV SOLN
2.0000 g | Freq: Once | INTRAVENOUS | Status: AC
  Administered 2020-04-08: 18:00:00 2 g via INTRAVENOUS
  Filled 2020-04-08: qty 50

## 2020-04-08 MED ORDER — PHENYLEPHRINE CONCENTRATED 100MG/250ML (0.4 MG/ML) INFUSION SIMPLE
0.0000 ug/min | INTRAVENOUS | Status: DC
  Administered 2020-04-08: 60 ug/min via INTRAVENOUS
  Filled 2020-04-08: qty 250

## 2020-04-08 MED ORDER — QUETIAPINE FUMARATE 50 MG PO TABS
50.0000 mg | ORAL_TABLET | Freq: Every day | ORAL | Status: DC
  Administered 2020-04-08: 50 mg via ORAL
  Filled 2020-04-08: qty 1

## 2020-04-08 NOTE — Progress Notes (Signed)
Nutrition Follow-up  DOCUMENTATION CODES:   Not applicable  INTERVENTION:  - continue Boost Breeze once/day and Ensure Enlive BID. - floor staff to provide meal-time assistance.  * weigh patient today.    NUTRITION DIAGNOSIS:   Increased nutrient needs related to acute illness, chronic illness, cancer and cancer related treatments as evidenced by estimated needs. -ongoing  GOAL:   Patient will meet greater than or equal to 90% of their needs -unmet on average  MONITOR:   PO intake, Supplement acceptance, Labs, Weight trends  ASSESSMENT:   79 y.o. 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 He presented with worsening SOB. He was noted to be a poor historian in the ED. He denied N/V/D, abdominal pain, or dysuria. He reported decreased PO intake PTA.  Patient last assessed remotely by this RD on 4/22. Able to see patient is person today. He is noted to be a/o to self only but is able to interact and respond to some items appropriately. Sitter at bedside; this is her first day with patient.   Per flow sheet documentation patient has consumed the following over the past week: 4/22- 90% of lunch, 60% of dinner 4/23- 45% of lunch 4/26- 10% of lunch, 10% of dinner 4/27- 0% of breakfast, 10% of lunch, 10% of dinner  Per review of orders, he has been accepting Boost Breeze 100% of the time offered and Ensure Enlive 90% of the time offered. He has not been weighed since admission on 4/21.  Visualized breakfast tray. Patient had eaten ~50% of grits and all other items appear untouched. Sitter reports patient had woken up shortly before tray arrived and that he had reported grits were too hot to continue eating.   Ongoing discussions being had with family  concerning GOC and medical course. Patient remains Full Code at this time.     Labs reviewed; BUN: 36 mg/dl, creatinine: 0.55 mg/dl, Ca: 8 mg/dl, Mg: 1.6 mg/dl. Medications reviewed; 100 mg colace/day, 40 mg solu-medrol/day, 40 mg oral protonix/day.    NUTRITION - FOCUSED PHYSICAL EXAM:  completed; no muscle or fat wasting, mild edema to all extremities.   Diet Order:   Diet Order            DIET DYS 3 Room service appropriate? Yes with Assist; Fluid consistency: Thin  Diet effective now              EDUCATION NEEDS:   No education needs have been identified at this time  Skin:  Skin Assessment: Reviewed RN Assessment  Last BM:  4/26  Height:   Ht Readings from Last 1 Encounters:  04/01/20 6\' 1"  (1.854 m)    Weight:   Wt Readings from Last 1 Encounters:  04/01/20 102 kg    Ideal Body Weight:  83.6 kg  BMI:  Body mass index is 29.67 kg/m.  Estimated Nutritional Needs:   Kcal:  6811-5726 kcal  Protein:  110-125 grams  Fluid:  >/= 2.2 L/day     Ryan Matin, MS, RD, LDN, CNSC Inpatient Clinical Dietitian RD pager # available in AMION  After hours/weekend pager # available in [facility]

## 2020-04-08 NOTE — Progress Notes (Addendum)
NAME:  Ryan Contreras, MRN:  [id number], DOB:  08/28/1941, LOS: 7 ADMISSION DATE:  04/01/2020, CONSULTATION DATE:  04/01/2020 REFERRING MD:  Dr. 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 Lamonte Sakai) admitted 4/21 with worsening shortness of breath in the setting of wosening non-small cell lung cancer and questionable post obstructive pneumonia.  CT chest on admission negative for acute PE but revealed progressive worsening of pulmonary mass with worsening right upper and right middle lobe collapse.  PCCM consulted for additional assistance in management.    Past Medical History  Type 2 diabetes Tobacco abuse Lung cancer Hyperlipidemia Hypertension COPD CAD status post stent [place] [facility] Events   4/21 Admit  4/22 PCCM signed off with rec's for palliative care / overall poor prognosis  4/28 PCCM called back as patient on neo / precedex   Consults:  PCCM  Procedures:    Significant Diagnostic Tests:   CTA chest 4/21 >> No CT evidence for acute pulmonary embolus. Continued progression of the right upper lobe pulmonary mass with worsening right upper lobe collapse/consolidation and interval development of complete collapse/consolidation of the right middle lobe. No substantial pleural effusion at this time. Slight progression of the right subdiaphragmatic soft tissue lesion posterior to hepatic dome. This lesion was not hypermetabolic on previous PET-CT. Continued attention on follow-up recommended. Mild progression of left hilar lymphadenopathy.  Emphysema (ICD10-J43.9) and Aortic Atherosclerosis (ICD10-170.0   Micro Data:  Covid 4/21 > negative MRSA PCR 4/21 > negative  Blood cultures 4/21 > negative   Antimicrobials:  Cefepime 4/21 > 4/28 Vancomycin 4/21 > 4/22  Interim history/subjective:    Patient in bed. States he is uncomfortable in bed.   RN reports pt remains on neo, precedex gtt's  Afebrile   Objective   Blood pressure (!) 110/58, pulse 83, temperature 97.6 F (36.4 C), temperature source Axillary, resp. rate 15, height 6\' 1"  (1.854 m), weight 102 kg, SpO2 100 %.        Intake/Output Summary (Last 24 hours) at 04/08/2020 1351 Last data filed at 04/08/2020 0500 Gross per 24 hour  Intake 1595.27 ml  Output 1100 ml  Net 495.27 ml   Filed Weights   04/01/20 1638  Weight: 102 kg    Examination: General: chronically ill appearing elderly male lying in bed in NAD HEENT: MM pink/moist, poor dentition / broken teeth, speech clear, anicteric Neuro: Awake, alert, oriented, appropriate / reports he is hurting from sitting in the bed CV: s1s2 rrr, no m/r/g PULM:  Non-labored on [place] O2, diminished on right, coarse on left   GI: soft, bsx4 active  Extremities: warm/dry, no LE edema  Skin: no rashes or lesions  Resolved Hospital Problem list     Assessment & Plan:   Acute on chronic hypoxic respiratory failure Recurrent non-small cell lung cancer Postobstructive pneumonia COPD Gold C CTA negative for PE but reveals worsning collapse of right upper lobe and new development of right middle lobe collapse.  He has been treated for 8 days with IV abx.   -wean O2 for sats 88-95% -stop antibiotics  -follow intermittent CXR -pulmonary hygiene - IS, mobilize -aspiration precautions  -palliative care working with patient, agree with ONC / [place] that he should be DNR/DNI   Intermittent Agitation / Delirium  Currently the patient is clear, easily redirectable  -wean  precedex to off over one hour -PRN haldol  -anticipate his neosynephrine will come off as well  -delirium prevention measures  -promote sleep / wake cycle   Hypotension  ? Accuracy of readings / on low dose neo  -wean neo to off for MAP >60  Best practice:  Diet: Heart healthy   Pain/Anxiety/Delirium protocol (if indicated): PRNs VAP protocol (if indicated): N/A DVT prophylaxis: Lovenox GI prophylaxis: PPI Glucose control: SSI Mobility: Bedrest  Code Status: Full Family Communication: Per primary  Disposition: Per primary   Labs   CBC: Recent Labs  Lab 04/04/20 1017 04/05/20 0500 04/06/20 0930 04/07/20 0306 04/08/20 0500  WBC 12.5* 9.4 6.3 8.1 7.3  NEUTROABS 12.0* 8.6* 5.2 6.3 6.3  HGB 10.2* 9.4* 9.2* 9.3* 7.8*  HCT 33.7* 31.1* 30.7* 30.3* 25.4*  MCV 102.4* 98.7 101.7* 100.0 100.4*  PLT 265 232 191 190 123*    Basic Metabolic Panel: Recent Labs  Lab 04/04/20 0725 04/04/20 0725 04/04/20 1017 04/05/20 0500 04/06/20 0930 04/07/20 0306 04/08/20 0500  NA 141   < > 137 139 136 134* 137  K 3.5   < > 4.9 4.2 4.9 4.5 3.6  CL 103   < > 88* 84* 92* 90* 99  CO2 33*   < > 40* 46* 39* 37* 34*  GLUCOSE 116*   < > 294* 128* 139* 110* 94  BUN 23   < > 32* 34* 35* 37* 36*  CREATININE 0.43*   < > 0.81 0.75 0.61 0.65 0.55*  CALCIUM 6.3*   < > 8.7* 8.6* 8.3* 8.9 8.0*  MG 1.6*  --   --  2.1 2.0 2.0 1.6*  PHOS 2.2*  --   --  3.9 3.9 3.3 2.5   < > = values in this interval not displayed.   GFR: Estimated Creatinine Clearance: 98.6 mL/min (A) (by C-G formula based on SCr of 0.55 mg/dL (L)). Recent Labs  Lab 04/02/20 0304 04/03/20 0301 04/05/20 0500 04/06/20 0930 04/07/20 0306 04/08/20 0500  PROCALCITON <0.10  --   --   --   --   --   WBC 18.3*   < > 9.4 6.3 8.1 7.3   < > = values in this interval not displayed.    Liver Function Tests: Recent Labs  Lab 04/04/20 0725 04/05/20 0500 04/06/20 0930 04/07/20 0306 04/08/20 0500  AST 19 28 31 29 21   ALT 17 25 33 33 28  ALKPHOS 49 62 56 64 53  BILITOT 0.4 0.7 0.9 1.0 0.8  PROT 4.8* 6.5 6.0* 6.4* 5.4*  ALBUMIN 2.0* 2.6* 2.5* 2.9* 2.4*   No results for input(s): LIPASE, AMYLASE in the last 168 hours. No results for input(s): AMMONIA in the last 168 hours.  ABG    Component Value Date/Time    PHART 7.328 (L) 04/05/2020 1555   PCO2ART 91.6 (HH) 04/05/2020 1555   PO2ART 123 (H) 04/05/2020 1555   HCO3 46.7 (H) 04/05/2020 1555   O2SAT 97.6 04/05/2020 1555     Coagulation Profile: No results for input(s): INR, PROTIME in the last 168 hours.  Cardiac Enzymes: No results for input(s): CKTOTAL, CKMB, CKMBINDEX, TROPONINI in the last 168 hours.  HbA1C: Hemoglobin A1C  Date/Time Value Ref Range Status  06/25/2019 09:46 AM 6.4 (A) 4.0 - 5.6 % Final  10/30/2018 10:00 AM 6.0 (A) 4.0 - 5.6 % Final   Hgb A1c MFr Bld  Date/Time Value Ref Range Status  08/25/2016 11:10 AM 6.7 (H) 4.6 - 6.5 % Final  Comment:    Glycemic Control Guidelines for People with Diabetes:Non Diabetic:  <6%Goal of Therapy: <7%Additional Action Suggested:  >8%   04/13/2015 08:44 AM 6.4 4.6 - 6.5 % Final    Comment:    Glycemic Control Guidelines for People with Diabetes:Non Diabetic:  <6%Goal of Therapy: <7%Additional Action Suggested:  >8%     CBG: Recent Labs  Lab 04/01/20 1535 04/06/20 0432  GLUCAP 113* 139*      Signature:    Noe Gens, MSN, NP-C [facility] 04/08/2020, 1:51 PM   Please see [email or web address] for pager details.

## 2020-04-08 NOTE — Progress Notes (Signed)
SLP Cancellation Note  Patient Details Name: Ryan Contreras MRN: [id number] DOB: 04-Apr-1941   Cancelled treatment:       Reason Eval/Treat Not Completed: Medical issues which prohibited therapy(CCM were called back to see pt this pm, they continue to recommend comfort care; SLP will sign off at this time, please reconsult if indicated)  Kathleen Lime, MS [place]  [facility] 04/08/2020, 5:10 PM

## 2020-04-08 NOTE — Progress Notes (Signed)
PT Cancellation Note  Patient Details Name: Ryan Contreras MRN: [id number] DOB: 1941/08/08   Cancelled Treatment:    Reason Eval/Treat Not Completed: Medical issues which prohibited therapy, sedated for agitation, on Neo. Will consider discontinuing PT next day if no change in status. > GOC being considered/Palliative  Medicine following.   Claretha Cooper 04/08/2020, 7:19 AM [place] Pager [phone] Office [phone]

## 2020-04-08 NOTE — Progress Notes (Signed)
Pt was very restless throughout night.  However he was always clear as to where he is, who he is and his birthdate.  Ativan and haldol did not provide much rest for him at beginning of night.  Precedex was turned back on and neosynephrine titrated to achieve comfortable rest for the patient.  Pt still continued to be restless but much improved.  Bipap was able to be on for about 3-4 total hours of the night.

## 2020-04-08 NOTE — Progress Notes (Signed)
PROGRESS NOTE    Ryan Contreras  KGM:[id number] DOB: [birth year] DOA: 04/01/2020 PCP: Cyndi Bender, PA-C   Brief Narrative: Ryan Contreras is a 79 y.o. malewith medical history significant ofrecurrent non-small cell lung cancer squamous cell carcinoma status post chemoradiation and currently still on chemotherapy with second cycle on 03/31/2020, chronic hypoxic respiratory failure on 2 to 3 L oxygen at home, CAD status post stenting, COPD, history of chronic tobacco use, hypertension, hyperlipidemia, gout. Patient presented secondary to worsening dyspnea and found to have post obstructive pneumonia and a COPD exacerbation. Hospitalization complicated by severe hypercapnia requiring BiPAP in addition to confusion/agitation requiring Precedex.   Assessment & Plan:   Principal Problem:   Acute on chronic respiratory failure (HCC) Active Problems:   Hyperlipidemia   Gout   Essential hypertension   CAD, NATIVE VESSEL   COPD with acute exacerbation (HCC)   Primary malignant neoplasm of right lung metastatic to other site [facility])   Acute on chronic respiratory failure with hypoxia and hypercapnia Multifactorial in setting of COPD, lung disease/cancer. Patient with significant CO2 retention requiring BiPAP which is complicated by agitation and non-adherence. Patient requiring Precedex in order to tolerate BiPAP which causes hypotension requiring vasopressor support. Complicated situation. Palliative care has been consulted for continued goals of care discussions. -Continue BiPAP -ABG  COPD exacerbation No significant wheezing at this time. Started on Solu-medrol. On Pulmicort, Xopenex and Atrovent. Duoneb prn -Continue current regimen  Postobstructive pneumonia Concern noted on CT chest. Started empirically on Vancomycin and Cefepime. -Continue Cefepime to complete 8 days  Agitation/Delirium Possibly secondary to CO2 vs Solu-medrol. Requiring Precedex.  Hypotension Secondary to  Precedex. Requiring Neosynephrine. Complicated by metoprolol use as well -CCM recommendations  Recurrent non-small cell lung cancer Patient managed by oncology as an outpatient. Previously managed on chemotherapy, however, patient is not not a candidate per oncology. Recommendation for hospice. Palliative care consulted and working on goals of care discussions.  Essential hypertension Now with hypotension as mentioned above. -Decrease metoprolol to 12.5 mg daily  CAD -Continue aspirin, Plavix, Lipitor  Hyperlipidemia -Continue Lipitor  Gout -Continue allopurinol  Generalized deconditioning PT recommending SNF. Concern patient may be a hospice house candidate at time of discharge. Await continued goals of care discussions.   DVT prophylaxis: Lovenox Code Status:   Code Status: Full Code Family Communication: Sister at bedside Disposition Plan: Discharge pending continued goals of care discussions and/or ability to remain stable off of BiPAP continuously   Consultants:   Critical care medicine  Medical oncology  Palliative care medicine  Procedures:   BiPAP  Antimicrobials:  Vancomycin  Cefepime    Subjective: Confused.  Objective: Vitals:   04/08/20 1230 04/08/20 1245 04/08/20 1300 04/08/20 1315  BP: 99/61 (!) 96/46 111/66 (!) 110/58  Pulse: 91 98 85 83  Resp: 14 (!) 21 15 15   Temp:      TempSrc:      SpO2: 100% 100% 100% 100%  Weight:      Height:        Intake/Output Summary (Last 24 hours) at 04/08/2020 1334 Last data filed at 04/08/2020 0500 Gross per 24 hour  Intake 1695.27 ml  Output 1700 ml  Net -4.73 ml   Filed Weights   04/01/20 1638  Weight: 102 kg    Examination:  General exam: Appears calm and comfortable Respiratory system: Diminished on auscultation. Respiratory effort normal. Cardiovascular system: S1 & S2 heard, RRR. No murmurs, rubs, gallops or clicks. Gastrointestinal system: Abdomen is nondistended, soft and  nontender.  No organomegaly or masses felt. Normal bowel sounds heard. Central nervous system: Alert and oriented. No focal neurological deficits. Extremities: No edema. No calf tenderness Skin: No cyanosis. No rashes Psychiatry: Judgement and insight appear impaired. Agitation.     Data Reviewed: I have personally reviewed following labs and imaging studies  CBC: Recent Labs  Lab 04/04/20 1017 04/05/20 0500 04/06/20 0930 04/07/20 0306 04/08/20 0500  WBC 12.5* 9.4 6.3 8.1 7.3  NEUTROABS 12.0* 8.6* 5.2 6.3 6.3  HGB 10.2* 9.4* 9.2* 9.3* 7.8*  HCT 33.7* 31.1* 30.7* 30.3* 25.4*  MCV 102.4* 98.7 101.7* 100.0 100.4*  PLT 265 232 191 190 025*   Basic Metabolic Panel: Recent Labs  Lab 04/04/20 0725 04/04/20 0725 04/04/20 1017 04/05/20 0500 04/06/20 0930 04/07/20 0306 04/08/20 0500  NA 141   < > 137 139 136 134* 137  K 3.5   < > 4.9 4.2 4.9 4.5 3.6  CL 103   < > 88* 84* 92* 90* 99  CO2 33*   < > 40* 46* 39* 37* 34*  GLUCOSE 116*   < > 294* 128* 139* 110* 94  BUN 23   < > 32* 34* 35* 37* 36*  CREATININE 0.43*   < > 0.81 0.75 0.61 0.65 0.55*  CALCIUM 6.3*   < > 8.7* 8.6* 8.3* 8.9 8.0*  MG 1.6*  --   --  2.1 2.0 2.0 1.6*  PHOS 2.2*  --   --  3.9 3.9 3.3 2.5   < > = values in this interval not displayed.   GFR: Estimated Creatinine Clearance: 98.6 mL/min (A) (by C-G formula based on SCr of 0.55 mg/dL (L)). Liver Function Tests: Recent Labs  Lab 04/04/20 0725 04/05/20 0500 04/06/20 0930 04/07/20 0306 04/08/20 0500  AST 19 28 31 29 21   ALT 17 25 33 33 28  ALKPHOS 49 62 56 64 53  BILITOT 0.4 0.7 0.9 1.0 0.8  PROT 4.8* 6.5 6.0* 6.4* 5.4*  ALBUMIN 2.0* 2.6* 2.5* 2.9* 2.4*   No results for input(s): LIPASE, AMYLASE in the last 168 hours. No results for input(s): AMMONIA in the last 168 hours. Coagulation Profile: No results for input(s): INR, PROTIME in the last 168 hours. Cardiac Enzymes: No results for input(s): CKTOTAL, CKMB, CKMBINDEX, TROPONINI in the last 168 hours. BNP  (last 3 results) No results for input(s): PROBNP in the last 8760 hours. HbA1C: No results for input(s): HGBA1C in the last 72 hours. CBG: Recent Labs  Lab 04/01/20 1535 04/06/20 0432  GLUCAP 113* 139*   Lipid Profile: No results for input(s): CHOL, HDL, LDLCALC, TRIG, CHOLHDL, LDLDIRECT in the last 72 hours. Thyroid Function Tests: No results for input(s): TSH, T4TOTAL, FREET4, T3FREE, THYROIDAB in the last 72 hours. Anemia Panel: No results for input(s): VITAMINB12, FOLATE, FERRITIN, TIBC, IRON, RETICCTPCT in the last 72 hours. Sepsis Labs: Recent Labs  Lab 04/02/20 0304  PROCALCITON <0.10    Recent Results (from the past 240 hour(s))  SARS CORONAVIRUS 2 (TAT 6-24 HRS) Nasopharyngeal Nasopharyngeal Swab     Status: None   Collection Time: 04/01/20  6:03 AM   Specimen: Nasopharyngeal Swab  Result Value Ref Range Status   SARS Coronavirus 2 NEGATIVE NEGATIVE Final    Comment: (NOTE) SARS-CoV-2 target nucleic acids are NOT DETECTED. The SARS-CoV-2 RNA is generally detectable in upper and lower respiratory specimens during the acute phase of infection. Negative results do not preclude SARS-CoV-2 infection, do not rule out co-infections with other pathogens, and should  not be used as the sole basis for treatment or other patient management decisions. Negative results must be combined with clinical observations, patient history, and epidemiological information. The expected result is Negative. Fact Sheet for Patients: SugarRoll.be Fact Sheet for Healthcare Providers: https://www.woods-mathews.com/ This test is not yet approved or cleared by the [place] FDA and  has been authorized for detection and/or diagnosis of SARS-CoV-2 by FDA under an Emergency Use Authorization (EUA). This EUA will remain  in effect (meaning this test can be used) for the duration of the COVID-19 declaration under Section 56 4(b)(1) of the Act, 21  U.S.C. section 360bbb-3(b)(1), unless the authorization is terminated or revoked sooner. Performed at [facility], [place] [address], [place], [place] [address]   MRSA culture     Status: None   Collection Time: 04/01/20 11:49 AM   Specimen: Body Fluid  Result Value Ref Range Status   Specimen Description   Final    NASAL SWAB Performed at [place] [address], [place], [place] [address]    Special Requests   Final    NONE Performed at [facility], [place] [address], [place], [place] [address]    Culture   Final    NO GROWTH Performed at [facility], [place] [address], [place], [place] [address]    Report Status 04/02/2020 FINAL  Final  Culture, blood (routine x 2)     Status: None   Collection Time: 04/01/20 11:49 AM   Specimen: BLOOD  Result Value Ref Range Status   Specimen Description   Final    BLOOD PORTA CATH LEFT Performed at [facility], [place] [address], [place], [place] [address]    Special Requests   Final    BOTTLES DRAWN AEROBIC AND ANAEROBIC Blood Culture adequate volume Performed at [place] [address], [place], [place] [address]    Culture   Final    NO GROWTH 5 DAYS Performed at [facility], [place] [address] [place], [place] [address]    Report Status 04/06/2020 FINAL  Final  Culture, blood (routine x 2)     Status: None   Collection Time: 04/01/20 11:49 AM   Specimen: BLOOD  Result Value Ref Range Status   Specimen Description   Final    BLOOD PORTA CATH LEFT Performed at [facility], [place] [address], [place], [place] [address]    Special Requests   Final    BOTTLES DRAWN AEROBIC AND ANAEROBIC Blood Culture adequate volume Performed at [place] [address] [place], [place] [address]    Culture   Final    NO GROWTH 5 DAYS Performed at [facility], [place] [address] [place], [place] [address]    Report Status  04/06/2020 FINAL  Final  MRSA PCR Screening     Status: None   Collection Time: 04/01/20  6:00 PM   Specimen: Nasopharyngeal  Result Value Ref Range Status   MRSA by PCR NEGATIVE NEGATIVE Final    Comment:        The GeneXpert MRSA Assay (FDA approved for NASAL specimens only), is one component of a comprehensive MRSA colonization surveillance program. It is not intended to diagnose MRSA infection nor to guide or monitor treatment for MRSA infections. Performed at [facility], [place] [address] [place], [place] [address]          Radiology Studies: DG  CHEST PORT 1 VIEW  Result Date: 04/08/2020 CLINICAL DATA:  Shortness of breath. COPD. Lung carcinoma EXAM: PORTABLE CHEST 1 VIEW COMPARISON:  04/07/2020 FINDINGS: Left-sided power port remains in appropriate position. Patient is rotated to the right. A circumscribed nodule in the right upper lobe is also stable. Bullous emphysema again demonstrated. Left lung is clear. Heart size is stable. IMPRESSION: 1. Stable right midlung opacity and right upper lobe pulmonary nodule. 2. Bullous emphysema. Electronically Signed   By: Marlaine Hind M.D.   On: 04/08/2020 08:21   DG CHEST PORT 1 VIEW  Result Date: 04/07/2020 CLINICAL DATA:  Shortness of breath EXAM: PORTABLE CHEST 1 VIEW COMPARISON:  Yesterday FINDINGS: Left-sided port with tip at the upper cavoatrial junction. Volume loss and hazy opacity in the right mid lung where there has been lung cancer treatment. The left chest is clear. Normal heart size. No visible effusion or pneumothorax. IMPRESSION: Stable right perihilar opacity and volume loss. Electronically Signed   By: Monte Fantasia M.D.   On: 04/07/2020 08:57        Scheduled Meds: . allopurinol  100 mg Oral Daily  . arformoterol  15 mcg Nebulization BID  . aspirin EC  81 mg Oral QHS  . atorvastatin  80 mg Oral QHS  . brimonidine  1 drop Both Eyes BID  . budesonide (PULMICORT) nebulizer solution  0.25 mg  Nebulization BID  . chlorhexidine  15 mL Mouth Rinse BID  . Chlorhexidine Gluconate Cloth  6 each Topical Daily  . clopidogrel  75 mg Oral Daily  . docusate sodium  100 mg Oral Daily  . enoxaparin (LOVENOX) injection  40 mg Subcutaneous Q24H  . feeding supplement  1 Container Oral Q24H  . feeding supplement (ENSURE ENLIVE)  237 mL Oral BID BM  . ipratropium  0.5 mg Nebulization TID  . levalbuterol  0.63 mg Nebulization TID  . mouth rinse  15 mL Mouth Rinse q12n4p  . methylPREDNISolone (SOLU-MEDROL) injection  40 mg Intravenous Q24H  . metoprolol succinate  25 mg Oral Daily  . multivitamin with minerals  1 tablet Oral Daily  . pantoprazole  40 mg Oral Daily  . sodium chloride flush  10-40 mL Intracatheter Q12H  . tamsulosin  0.4 mg Oral Daily   Continuous Infusions: . sodium chloride 10 mL/hr at 04/08/20 0500  . dexmedetomidine (PRECEDEX) IV infusion 0.3 mcg/kg/hr (04/08/20 1008)  . phenylephrine (NEO-SYNEPHRINE) Adult infusion 65 mcg/min (04/08/20 0500)  . phenylephrine (NEO-SYNEPHRINE) Adult infusion 40 mcg/min (04/08/20 1233)     LOS: 7 days     Cordelia Poche, MD [facility] 04/08/2020, 1:34 PM  If 7PM-7AM, please contact night-coverage [email or web address]

## 2020-04-09 DIAGNOSIS — I2510 Atherosclerotic heart disease of native coronary artery without angina pectoris: Secondary | ICD-10-CM | POA: Diagnosis not present

## 2020-04-09 DIAGNOSIS — Z515 Encounter for palliative care: Secondary | ICD-10-CM

## 2020-04-09 DIAGNOSIS — R0602 Shortness of breath: Secondary | ICD-10-CM

## 2020-04-09 DIAGNOSIS — R531 Weakness: Secondary | ICD-10-CM | POA: Diagnosis not present

## 2020-04-09 DIAGNOSIS — Z7189 Other specified counseling: Secondary | ICD-10-CM | POA: Diagnosis not present

## 2020-04-09 DIAGNOSIS — J9621 Acute and chronic respiratory failure with hypoxia: Secondary | ICD-10-CM | POA: Diagnosis not present

## 2020-04-09 DIAGNOSIS — J441 Chronic obstructive pulmonary disease with (acute) exacerbation: Secondary | ICD-10-CM | POA: Diagnosis not present

## 2020-04-09 DIAGNOSIS — I10 Essential (primary) hypertension: Secondary | ICD-10-CM | POA: Diagnosis not present

## 2020-04-09 DIAGNOSIS — J9622 Acute and chronic respiratory failure with hypercapnia: Secondary | ICD-10-CM | POA: Diagnosis not present

## 2020-04-09 LAB — CBC
HCT: 24.5 % — ABNORMAL LOW (ref 39.0–52.0)
Hemoglobin: 7.5 g/dL — ABNORMAL LOW (ref 13.0–17.0)
MCH: 30 pg (ref 26.0–34.0)
MCHC: 30.6 g/dL (ref 30.0–36.0)
MCV: 98 fL (ref 80.0–100.0)
Platelets: 102 10*3/uL — ABNORMAL LOW (ref 150–400)
RBC: 2.5 MIL/uL — ABNORMAL LOW (ref 4.22–5.81)
RDW: 15.1 % (ref 11.5–15.5)
WBC: 5.7 10*3/uL (ref 4.0–10.5)
nRBC: 0 % (ref 0.0–0.2)

## 2020-04-09 LAB — COMPREHENSIVE METABOLIC PANEL
ALT: 29 U/L (ref 0–44)
AST: 22 U/L (ref 15–41)
Albumin: 2.7 g/dL — ABNORMAL LOW (ref 3.5–5.0)
Alkaline Phosphatase: 58 U/L (ref 38–126)
Anion gap: 7 (ref 5–15)
BUN: 27 mg/dL — ABNORMAL HIGH (ref 8–23)
CO2: 36 mmol/L — ABNORMAL HIGH (ref 22–32)
Calcium: 8.6 mg/dL — ABNORMAL LOW (ref 8.9–10.3)
Chloride: 94 mmol/L — ABNORMAL LOW (ref 98–111)
Creatinine, Ser: 0.63 mg/dL (ref 0.61–1.24)
GFR calc Af Amer: >60 mL/min (ref >60)
GFR calc non Af Amer: >60 mL/min (ref >60)
Glucose, Bld: 103 mg/dL — ABNORMAL HIGH (ref 70–99)
Potassium: 3.9 mmol/L (ref 3.5–5.1)
Sodium: 137 mmol/L (ref 135–145)
Total Bilirubin: 0.8 mg/dL (ref 0.3–1.2)
Total Protein: 5.9 g/dL — ABNORMAL LOW (ref 6.5–8.1)

## 2020-04-09 LAB — MAGNESIUM: Magnesium: 1.9 mg/dL (ref 1.7–2.4)

## 2020-04-09 NOTE — TOC Progression Note (Addendum)
Transition of Care [facility]) - Progression Note    Patient Details  Name: Ryan Contreras MRN: [id number] Date of Birth: 05-23-1941  Transition of Care [facility]) CM/SW Contact  Leeroy Cha, RN Phone Number: 04/09/2020, 11:59 AM  Clinical Narrative:    tct-[facility] for [place] area/information given will contact family and set up transfer asap. TCF-cHERI PT CAN TO GO hospice OF [facility] IN [place] AT 4PM/family is aware and signing paperwork.  DNR form signed. Package for ptar given to RN in charge of patient. Number to call report to [phone]. ptar called for 4 15 pickup at 215pm.      Expected Discharge Plan and Services           Expected Discharge Date: (unknown)                                     Social Determinants of Health (SDOH) Interventions    Readmission Risk Interventions No flowsheet data found.

## 2020-04-09 NOTE — Progress Notes (Signed)
Physical Therapy Discharge Patient Details Name: Ryan Contreras MRN: [id number] DOB: [birth year] Today's Date: 04/09/2020 Time:  -     Patient discharged from PT services secondary to medical decline - Comfort care measures are recommended by CCM. Please see latest therapy progress note for current level of functioning and progress toward goals.    Progress and discharge plan discussed with patient and/or caregiver: NA GP     Claretha Cooper 04/09/2020, 7:04 AM [place] Pager [phone] Office [phone]

## 2020-04-09 NOTE — Progress Notes (Addendum)
Report called to [place]. [facility] transporting patient.  VSS 2L [place]. All belongings sent with patient. Sister [name] called and updated.

## 2020-04-09 NOTE — Progress Notes (Signed)
PT Cancellation Note  Patient Details Name: Ryan Contreras MRN: [id number] DOB: 02-Feb-1941   Cancelled Treatment:    Reason Eval/Treat Not Completed: PT screened, no needs identified, will sign offto transfer to residential Hospice.   Claretha Cooper 04/09/2020, 12:53 PM  [place] Pager [phone] Office [phone]

## 2020-04-09 NOTE — Progress Notes (Signed)
OT Cancellation Note  Patient Details Name: Ryan Contreras MRN: [id number] DOB: 09-Dec-1941   Cancelled Treatment:    Reason Eval/Treat Not Completed: Medical issues which prohibited therapy(Patient transferring to residential hospice. Will sign off. )  Delbert Phenix OT Pager: [place] 04/09/2020, 12:57 PM

## 2020-04-09 NOTE — Progress Notes (Signed)
[facility]: Ryan Contreras with family over conference call sister [name], daughter [name], and daughter [name]. Discussed hospice care, goals and comfort approach. They confirm interest in the [facility]. MD Dr. Darcel Smalling has approved the pt for hospice services at inpt. facility. The daughter will sign paperwork by e-mail. I have spoke to [place] with CM and she will contact MD and set up ambulance around 400pm if MD in agreement.   Thank you for this referral. Webb Silversmith RN [phone]  Phone number for RN to call report is [phone].

## 2020-04-09 NOTE — Progress Notes (Signed)
NAME:  Ryan Contreras, MRN:  [id number], DOB:  [birth year], LOS: 8 ADMISSION DATE:  04/01/2020, CONSULTATION DATE:  04/01/2020 REFERRING MD:  Dr. 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 Lamonte Sakai) admitted 4/21 with worsening shortness of breath in the setting of wosening non-small cell lung cancer and questionable post obstructive pneumonia.  CT chest on admission negative for acute PE but revealed progressive worsening of pulmonary mass with worsening right upper and right middle lobe collapse.  PCCM consulted for additional assistance in management.    Past Medical History  Type 2 diabetes Tobacco abuse Lung cancer Hyperlipidemia Hypertension COPD CAD status post stent [facility] Events   4/21 Admit  4/22 PCCM signed off with rec's for palliative care / overall poor prognosis  4/28 PCCM called back as patient on neo / precedex   Consults:  PCCM  Procedures:    Significant Diagnostic Tests:   CTA chest 4/21 >> No CT evidence for acute pulmonary embolus. Continued progression of the right upper lobe pulmonary mass with worsening right upper lobe collapse/consolidation and interval development of complete collapse/consolidation of the right middle lobe. No substantial pleural effusion at this time. Slight progression of the right subdiaphragmatic soft tissue lesion posterior to hepatic dome. This lesion was not hypermetabolic on previous PET-CT. Continued attention on follow-up recommended. Mild progression of left hilar lymphadenopathy.  Emphysema (ICD10-J43.9) and Aortic Atherosclerosis (ICD10-170.0   Micro Data:  Covid 4/21 > negative MRSA PCR 4/21 > negative  Blood cultures 4/21 > negative   Antimicrobials:  Cefepime 4/21 > 4/28 Vancomycin 4/21 > 4/22  Interim history/subjective:    Seen by palliative medicine > hospice [place] night otherwise  Objective   Blood pressure (!) 154/44, pulse 87, temperature 98 F (36.7 C), temperature source Oral, resp. rate 16, height 6\' 1"  (1.854 m), weight 99.7 kg, SpO2 96 %.        Intake/Output Summary (Last 24 hours) at 04/09/2020 1030 Last data filed at 04/09/2020 0900 Gross per 24 hour  Intake 1102.11 ml  Output 3575 ml  Net -2472.89 ml   Filed Weights   04/01/20 1638 04/08/20 1730  Weight: 102 kg 99.7 kg    Examination:  General:  Resting comfortably in bed HENT: NCAT OP clear PULM: CTA B, normal effort CV: RRR, no mgr GI: BS+, soft, nontender MSK: normal bulk and tone Neuro: awake, alert, no distress, MAEW   Resolved Hospital Problem list     Assessment & Plan:   Acute on chronic hypoxic and hypercarbic respiratory failure Obesity hypoventilation syndrome Recurrent non-small cell lung cancer Postobstructive pneumonia COPD Gold C - BIPAP prn at this point -agree with DNR/hospice care as he will not survive this illness  Intermittent Agitation / Delirium  Monitor Haldol prn  Best practice:  Diet: Heart healthy  Pain/Anxiety/Delirium protocol (if indicated): PRNs VAP protocol (if indicated): N/A DVT prophylaxis: Lovenox GI prophylaxis: PPI Glucose control: SSI Mobility: Bedrest  Code Status: Full Family Communication: Per primary / palliative Disposition: Per primary   Labs   CBC: Recent Labs  Lab 04/04/20 1017 04/04/20 1017 04/05/20 0500 04/06/20 0930 04/07/20 0306 04/08/20 0500 04/09/20 0500  WBC 12.5*   < > 9.4 6.3 8.1 7.3 5.7  NEUTROABS 12.0*  --  8.6* 5.2 6.3 6.3  --   HGB 10.2*   < >  9.4* 9.2* 9.3* 7.8* 7.5*  HCT 33.7*   < > 31.1* 30.7* 30.3* 25.4* 24.5*  MCV 102.4*   < > 98.7 101.7* 100.0 100.4* 98.0  PLT 265   < > 232 191 190 123* 102*   < > = values in this interval not displayed.    Basic Metabolic Panel: Recent Labs  Lab 04/04/20 0725 04/04/20 1017 04/05/20 0500  04/06/20 0930 04/07/20 0306 04/08/20 0500 04/09/20 0500  NA 141   < > 139 136 134* 137 137  K 3.5   < > 4.2 4.9 4.5 3.6 3.9  CL 103   < > 84* 92* 90* 99 94*  CO2 33*   < > 46* 39* 37* 34* 36*  GLUCOSE 116*   < > 128* 139* 110* 94 103*  BUN 23   < > 34* 35* 37* 36* 27*  CREATININE 0.43*   < > 0.75 0.61 0.65 0.55* 0.63  CALCIUM 6.3*   < > 8.6* 8.3* 8.9 8.0* 8.6*  MG 1.6*   < > 2.1 2.0 2.0 1.6* 1.9  PHOS 2.2*  --  3.9 3.9 3.3 2.5  --    < > = values in this interval not displayed.   GFR: Estimated Creatinine Clearance: 97.6 mL/min (by C-G formula based on SCr of 0.63 mg/dL). Recent Labs  Lab 04/06/20 0930 04/07/20 0306 04/08/20 0500 04/09/20 0500  WBC 6.3 8.1 7.3 5.7    Liver Function Tests: Recent Labs  Lab 04/05/20 0500 04/06/20 0930 04/07/20 0306 04/08/20 0500 04/09/20 0500  AST 28 31 29 21 22   ALT 25 33 33 28 29  ALKPHOS 62 56 64 53 58  BILITOT 0.7 0.9 1.0 0.8 0.8  PROT 6.5 6.0* 6.4* 5.4* 5.9*  ALBUMIN 2.6* 2.5* 2.9* 2.4* 2.7*   No results for input(s): LIPASE, AMYLASE in the last 168 hours. No results for input(s): AMMONIA in the last 168 hours.  ABG    Component Value Date/Time   PHART 7.328 (L) 04/05/2020 1555   PCO2ART 91.6 (HH) 04/05/2020 1555   PO2ART 123 (H) 04/05/2020 1555   HCO3 46.7 (H) 04/05/2020 1555   O2SAT 97.6 04/05/2020 1555     Coagulation Profile: No results for input(s): INR, PROTIME in the last 168 hours.  Cardiac Enzymes: No results for input(s): CKTOTAL, CKMB, CKMBINDEX, TROPONINI in the last 168 hours.  HbA1C: Hemoglobin A1C  Date/Time Value Ref Range Status  06/25/2019 09:46 AM 6.4 (A) 4.0 - 5.6 % Final  10/30/2018 10:00 AM 6.0 (A) 4.0 - 5.6 % Final   Hgb A1c MFr Bld  Date/Time Value Ref Range Status  08/25/2016 11:10 AM 6.7 (H) 4.6 - 6.5 % Final    Comment:    Glycemic Control Guidelines for People with Diabetes:Non Diabetic:  <6%Goal of Therapy: <7%Additional Action Suggested:  >8%   04/13/2015 08:44 AM 6.4 4.6 - 6.5  % Final    Comment:    Glycemic Control Guidelines for People with Diabetes:Non Diabetic:  <6%Goal of Therapy: <7%Additional Action Suggested:  >8%     CBG: Recent Labs  Lab 04/06/20 0432  GLUCAP 139*      CC time: n/a    Roselie Awkward, MD [place] [facility] Pager: [phone] Cell: [phone] If no response, call [phone]

## 2020-04-09 NOTE — Discharge Summary (Signed)
Physician Discharge Summary  Ryan Contreras CBS:[id number] DOB: [birth year] DOA: 04/01/2020  PCP: Ryan Bender, PA-C  Admit date: 04/01/2020 Discharge date: 04/09/2020  Admitted From: Home Disposition: Hospice   Discharge Condition: Hospice CODE STATUS: DNR Diet recommendation: Dysphagia 3 vs comfort  Brief/Interim Summary:  Admission HPI written by Aline August, MD   Chief Complaint: Shortness of breath  HPI: Ryan Contreras is a 79 y.o. 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  Patient is a poor historian and does not provide accurate history.  I have reviewed patient's medical records and communicated with Dr. Julien Nordmann and Dr. Kyung Rudd as well.  Patient had his second cycle of current chemotherapy on 03/31/2020 and presented today to the ED with worsening shortness of breath.  At baseline, he wears 2 to 3 L of oxygen at home.  Patient complains of progressively worsening shortness of breath even with minimal exertion.  He denies fevers or worsening cough.  No chest pain.  Denies nausea, vomiting, diarrhea, abdominal pain, dysuria.  He does report decreased p.o. intake.  No known Covid exposures.    Hospital course:  Acute on chronic respiratory failure with hypoxia and hypercapnia Multifactorial in setting of COPD, lung disease/cancer. Patient with significant CO2 retention requiring BiPAP which is complicated by agitation and non-adherence. Patient required Precedex in order to tolerate BiPAP which causes hypotension requiring vasopressor support. Complicated situation. Palliative care has been consulted for continued goals  of care discussions and patient transitioned to comfort measures only. Patient will likely continue to retain CO2 without BiPAP.  COPD exacerbation No significant wheezing at this time. Started on Solu-medrol. On Pulmicort, Xopenex and Atrovent. Duoneb prn.   Postobstructive pneumonia Concern noted on CT chest. Started empirically on Vancomycin and Cefepime. Completed course.  Agitation/Delirium Possibly secondary to CO2 vs Solu-medrol. Requiring Precedex. Started on Seroquel with possibly some improvement.  Hypotension Secondary to Precedex. Requiring Neosynephrine. Complicated by metoprolol use as well. Resolved.  Recurrent non-small cell lung cancer Patient managed by oncology as an outpatient. Previously managed on chemotherapy, however, patient is not not a candidate per oncology. Recommendation for hospice. Palliative care consulted and working on goals of care discussions.  Essential hypertension CAD Hyperlipidemia Gout Generalized deconditioning PT recommending SNF however patient transitioned to comfort measures.  Discharge Diagnoses:  Principal Problem:   Acute on chronic respiratory failure (HCC) Active Problems:   Hyperlipidemia   Gout   Essential hypertension   CAD, NATIVE VESSEL   COPD with acute exacerbation (HCC)   Primary malignant neoplasm of right lung metastatic to other site [facility])   Palliative care by specialist   General weakness    Discharge Instructions   Allergies as of 04/09/2020   No Known Allergies     Medication List    STOP taking these medications   acetaminophen 500 MG tablet Commonly known as: TYLENOL   allopurinol 100 MG tablet Commonly known as: ZYLOPRIM   aspirin 81 MG tablet   atorvastatin 80 MG tablet Commonly known as: LIPITOR   brimonidine 0.15 % ophthalmic solution Commonly known as: ALPHAGAN   clopidogrel 75 MG tablet Commonly known as: PLAVIX   docusate sodium 100 MG capsule Commonly known as: COLACE  eplerenone 25 MG tablet Commonly known as: INSPRA   HYDROcodone-homatropine 5-1.5 MG/5ML syrup Commonly known as: HYCODAN   Klor-Con M20 20 MEQ tablet Generic drug: potassium chloride SA   lidocaine-prilocaine cream Commonly known as: EMLA   metoprolol succinate 25 MG 24 hr tablet Commonly known as: TOPROL-XL   Na Sulfate-K Sulfate-Mg Sulf 17.5-3.13-1.6 GM/177ML Soln   OXYGEN   pantoprazole 40 MG tablet Commonly known as: PROTONIX   polyethylene glycol 17 g packet Commonly known as: MIRALAX / GLYCOLAX   ProAir HFA 108 (90 Base) MCG/ACT inhaler Generic drug: albuterol   prochlorperazine 10 MG tablet Commonly known as: COMPAZINE   sildenafil 20 MG tablet Commonly known as: REVATIO   tamsulosin 0.4 MG Caps capsule Commonly known as: FLOMAX   Trelegy Ellipta 100-62.5-25 MCG/INH Aepb Generic drug: Fluticasone-Umeclidin-Vilant       No Known Allergies  Consultations:  PCCM  Palliative care medicine   Procedures/Studies: DG Chest 2 View  Result Date: 04/01/2020 CLINICAL DATA:  Worsening shortness of breath. History of non-small cell lung cancer. EXAM: CHEST - 2 VIEW COMPARISON:  Chest x-ray 02/28/2020 and PET-CT 02/04/2020 FINDINGS: Stable right upper lobe lung mass and surrounding extensive radiation changes with loss of volume in the right hemithorax. Slight increase in surrounding right lung density which could reflect pneumonitis or interstitial spread of tumor. The left lung remains relatively clear. Left IJ power port in good position without complicating features. No pleural effusions or pneumothorax. IMPRESSION: 1. Stable right upper lobe lung mass and surrounding radiation changes. 2. Slight increase in surrounding right lung density which could reflect pneumonitis or interstitial spread of tumor. Electronically Signed   By: Marijo Sanes M.D.   On: 04/01/2020 06:32   CT HEAD WO CONTRAST  Result Date: 04/01/2020 CLINICAL DATA:  Altered mental status.  Lethargy. EXAM: CT HEAD WITHOUT CONTRAST TECHNIQUE: Contiguous axial images were obtained from the base of the skull through the vertex without intravenous contrast. COMPARISON:  Head MRI 02/04/2016 FINDINGS: Brain: The study is mildly to moderately motion degraded despite an attempt at repeat imaging. Within this limitation, no acute large territory infarct, intracranial hemorrhage, mass, midline shift, or extra-axial fluid collection is identified. The ventricles and sulci are normal. Hypodensities in the cerebral white matter bilaterally are nonspecific but compatible with mild chronic small vessel ischemic disease. Vascular: Calcified atherosclerosis at the skull base. Skull: No fracture or suspicious osseous lesion. Sinuses/Orbits: Paranasal sinuses and mastoid air cells are clear. Bilateral cataract extraction. Other: None. IMPRESSION: 1. Motion degraded examination without evidence of acute intracranial abnormality. 2. Mild chronic small vessel ischemic disease. Electronically Signed   By: Logan Bores M.D.   On: 04/01/2020 16:41   CT Angio Chest PE W and/or Wo Contrast  Result Date: 04/01/2020 CLINICAL DATA:  Hypoxemia. Shortness of breath. History of lung cancer. EXAM: CT ANGIOGRAPHY CHEST WITH CONTRAST TECHNIQUE: Multidetector CT imaging of the chest was performed using the standard protocol during bolus administration of intravenous contrast. Multiplanar CT image reconstructions and MIPs were obtained to evaluate the vascular anatomy. CONTRAST:  235m OMNIPAQUE IOHEXOL 350 MG/ML SOLN COMPARISON:  CT chest 01/20/2020.  PET-CT 02/04/2020. FINDINGS: Cardiovascular: The heart size is normal. No substantial pericardial effusion. Coronary artery calcification is evident. Atherosclerotic calcification is noted in the wall of the thoracic aorta. Left Port-A-Cath tip is positioned in the upper right atrium. No filling defects within the opacified pulmonary arteries to suggest the presence of an acute pulmonary  embolus. Mediastinum/Nodes: No mediastinal lymphadenopathy. 11 mm short axis  left hilar lymph node appears progressive in the interval. Consolidative changes are noted in the parahilar right lung. There is no axillary lymphadenopathy. The esophagus has normal imaging features. Lungs/Pleura: Centrilobular and paraseptal emphysema evident. Right upper lobe pulmonary mass measures 3.4 x 2.4 cm today compared to 3.0 x 2.6 cm previously. The collapse/consolidative changes in the right upper lobe are similar to minimally progressed in the interval. Since the prior study the patient has developed collapse/consolidative change in the right middle lobe as well. No substantial pleural effusion. Upper Abdomen: Right subdiaphragmatic soft tissue lesion measures 5.6 cm today compared to 4.8 cm previously. Musculoskeletal: No worrisome lytic or sclerotic osseous abnormality. Cortical erosion with fracture of the anterior fourth rib is similar to prior. Midline subcutaneous cystic lesion in the anterior chest wall is stable, compatible with sebaceous cyst. Review of the MIP images confirms the above findings. IMPRESSION: 1. No CT evidence for acute pulmonary embolus. 2. Continued progression of the right upper lobe pulmonary mass with worsening right upper lobe collapse/consolidation and interval development of complete collapse/consolidation of the right middle lobe. No substantial pleural effusion at this time. 3. Slight progression of the right subdiaphragmatic soft tissue lesion posterior to hepatic dome. This lesion was not hypermetabolic on previous PET-CT. Continued attention on follow-up recommended. 4. Mild progression of left hilar lymphadenopathy. 5.  Emphysema (ICD10-J43.9) and Aortic Atherosclerosis (ICD10-170.0) Electronically Signed   By: Misty Stanley M.D.   On: 04/01/2020 09:38   DG CHEST PORT 1 VIEW  Result Date: 04/08/2020 CLINICAL DATA:  Shortness of breath. COPD. Lung carcinoma EXAM: PORTABLE CHEST 1 VIEW  COMPARISON:  04/07/2020 FINDINGS: Left-sided power port remains in appropriate position. Patient is rotated to the right. A circumscribed nodule in the right upper lobe is also stable. Bullous emphysema again demonstrated. Left lung is clear. Heart size is stable. IMPRESSION: 1. Stable right midlung opacity and right upper lobe pulmonary nodule. 2. Bullous emphysema. Electronically Signed   By: Marlaine Hind M.D.   On: 04/08/2020 08:21   DG CHEST PORT 1 VIEW  Result Date: 04/07/2020 CLINICAL DATA:  Shortness of breath EXAM: PORTABLE CHEST 1 VIEW COMPARISON:  Yesterday FINDINGS: Left-sided port with tip at the upper cavoatrial junction. Volume loss and hazy opacity in the right mid lung where there has been lung cancer treatment. The left chest is clear. Normal heart size. No visible effusion or pneumothorax. IMPRESSION: Stable right perihilar opacity and volume loss. Electronically Signed   By: Monte Fantasia M.D.   On: 04/07/2020 08:57   DG CHEST PORT 1 VIEW  Result Date: 04/06/2020 CLINICAL DATA:  Shortness of breath EXAM: PORTABLE CHEST 1 VIEW COMPARISON:  Yesterday FINDINGS: Known right upper lobe mass with adjacent collapse/consolidation as evaluated by recent CT. There is no edema, consolidation, effusion, or pneumothorax. Normal heart size and stable post treatment distortion of mediastinal contours. Left-sided port with tip in good position. Stable heart size. IMPRESSION: Stable post treatment appearance of the chest. Electronically Signed   By: Monte Fantasia M.D.   On: 04/06/2020 05:37   DG CHEST PORT 1 VIEW  Result Date: 04/05/2020 CLINICAL DATA:  Shortness of breath EXAM: PORTABLE CHEST 1 VIEW COMPARISON:  04/04/2020 FINDINGS: LEFT-sided Port-A-Cath remains in place terminating at caval to atrial junction. Cardiomediastinal contours are stable with extensive post treatment changes in the RIGHT chest and middle lobe collapse with similar appearance. LEFT chest is clear. No sign of pleural  effusion. No acute bone finding. IMPRESSION: 1. No acute cardiopulmonary abnormality. 2.  Post treatment changes in the RIGHT chest with similar appearance to prior study. Essentially no change in the appearance of the chest compared to the prior exam. Electronically Signed   By: Zetta Bills M.D.   On: 04/05/2020 07:40   DG CHEST PORT 1 VIEW  Result Date: 04/04/2020 CLINICAL DATA:  79 year old male with history of shortness of breath. EXAM: PORTABLE CHEST 1 VIEW COMPARISON:  Chest x-ray 04/03/2020. FINDINGS: Left-sided single-lumen internal jugular power porta cath with tip terminating at the superior cavoatrial junction. Known right upper lobe mass and associated tumor infiltration and atelectasis in the right upper lobe and right middle lobe appears grossly similar to the prior study resulting in a focal opacity projecting over the right mid lung. Improved aeration in the right lung base when compared to the prior study, suggesting resolving areas of airspace consolidation and/or atelectasis in the right lower lobe. Left lung is clear. No pleural effusions. No evidence of pulmonary edema. Heart size is mildly enlarged. IMPRESSION: 1. Improving aeration in the right lower lobe which likely reflects resolving atelectasis and/or consolidation. Otherwise, the radiographic appearance the chest is very similar to the prior study, as above. Electronically Signed   By: Vinnie Langton M.D.   On: 04/04/2020 06:46   DG CHEST PORT 1 VIEW  Result Date: 04/03/2020 CLINICAL DATA:  Shortness of breath. EXAM: PORTABLE CHEST 1 VIEW COMPARISON:  04/01/2020 chest x-ray and CT scan. FINDINGS: The left IJ power port is stable. The cardiac silhouette, mediastinal and hilar contours are unchanged. Stable right upper lobe lung mass and surrounding radiation fibrosis. Overall slight worsening right lung aeration which could be due to surrounding pneumonitis or progressive atelectasis. The left lung remains clear. No  pneumothorax. IMPRESSION: 1. Stable right upper lobe lung mass and surrounding radiation fibrosis. 2. Slight worsening right lung aeration possibly due to surrounding pneumonitis or progressive atelectasis. Electronically Signed   By: Marijo Sanes M.D.   On: 04/03/2020 07:06   IR IMAGING GUIDED PORT INSERTION  Result Date: 03/23/2020 INDICATION: History of recurrent lung cancer. In need durable intravenous access for chemotherapy administration. EXAM: IMPLANTED PORT A CATH PLACEMENT WITH ULTRASOUND AND FLUOROSCOPIC GUIDANCE COMPARISON:  PET-CT-02/05/2020; chest CT-01/20/2020 MEDICATIONS: Ancef 2 gm IV; The antibiotic was administered within an appropriate time interval prior to skin puncture. ANESTHESIA/SEDATION: Moderate (conscious) sedation was employed during this procedure. A total of Versed 1 mg and Fentanyl 50 mcg was administered intravenously. Moderate Sedation Time: 40 minutes. The patient's level of consciousness and vital signs were monitored continuously by radiology nursing throughout the procedure under my direct supervision. CONTRAST:  None FLUOROSCOPY TIME:  36 seconds (19 mGy) COMPLICATIONS: None immediate. Note, patient was found have asymptomatic tachycardic prior to procedure. 12 lead EKG demonstrated sinus tachycardia and again, the patient was asymptomatic, specifically, no chest pain shortness of breath, with normal hemodynamic stability and as such the decision was made to proceed with definitive Port a catheter placement. PROCEDURE: The procedure, risks, benefits, and alternatives were explained to the patient. Questions regarding the procedure were encouraged and answered. The patient understands and consents to the procedure. Given presence right-sided lung cancer recurrence, decision was made to proceed with left internal jugular approach port a catheter placement. The left neck and chest were prepped with chlorhexidine in a sterile fashion, and a sterile drape was applied covering  the operative field. Maximum barrier sterile technique with sterile gowns and gloves were used for the procedure. A timeout was performed prior to the initiation of the  procedure. Local anesthesia was provided with 1% lidocaine with epinephrine. After creating a small venotomy incision, a micropuncture kit was utilized to access the internal jugular vein. Real-time ultrasound guidance was utilized for vascular access including the acquisition of a permanent ultrasound image documenting patency of the accessed vessel. The microwire was utilized to measure appropriate catheter length. A subcutaneous port pocket was then created along the upper chest wall utilizing a combination of sharp and blunt dissection. The pocket was irrigated with sterile saline. A single lumen "ISP" sized power injectable port was chosen for placement. The 8 Fr catheter was tunneled from the port pocket site to the venotomy incision. The port was placed in the pocket. The external catheter was trimmed to appropriate length. At the venotomy, an 8 Fr peel-away sheath was placed over a guidewire under fluoroscopic guidance. The catheter was then placed through the sheath and the sheath was removed. Final catheter positioning was confirmed and documented with a fluoroscopic spot radiograph. The port was accessed with a Huber needle, aspirated and flushed with heparinized saline. The venotomy site was closed with an interrupted 4-0 Vicryl suture. The port pocket incision was closed with interrupted 2-0 Vicryl suture. The skin was opposed with a running subcuticular 4-0 Vicryl suture. Dermabond and Steri-strips were applied to both incisions. Dressings were applied. The patient tolerated the procedure well without immediate post procedural complication. FINDINGS: After catheter placement, the tip lies within the superior cavoatrial junction. The catheter aspirates and flushes normally and is ready for immediate use. IMPRESSION: Successful placement  of a left internal jugular approach power injectable Port-A-Cath. The catheter is ready for immediate use. Electronically Signed   By: Sandi Mariscal M.D.   On: 03/23/2020 16:09      Subjective: Wants to get up and work with physical therapy.  Discharge Exam: Vitals:   04/09/20 1300 04/09/20 1400  BP:  (!) 117/52  Pulse: (!) 109 (!) 103  Resp: 17 15  Temp:    SpO2: 97% 100%   Vitals:   04/09/20 1130 04/09/20 1200 04/09/20 1300 04/09/20 1400  BP:  (!) 85/63  (!) 117/52  Pulse:  95 (!) 109 (!) 103  Resp:  '14 17 15  ' Temp: 98.6 F (37 C)     TempSrc: Oral     SpO2:  99% 97% 100%  Weight:      Height:        General: Pt is alert, awake, not in acute distress Cardiovascular: RRR, S1/S2 +, no rubs, no gallops Respiratory: CTA bilaterally, no wheezing, no rhonchi Abdominal: Soft, NT, ND, bowel sounds + Extremities: no cyanosis Neuro: alert, oriented    The results of significant diagnostics from this hospitalization (including imaging, microbiology, ancillary and laboratory) are listed below for reference.     Microbiology: Recent Results (from the past 240 hour(s))  SARS CORONAVIRUS 2 (TAT 6-24 HRS) Nasopharyngeal Nasopharyngeal Swab     Status: None   Collection Time: 04/01/20  6:03 AM   Specimen: Nasopharyngeal Swab  Result Value Ref Range Status   SARS Coronavirus 2 NEGATIVE NEGATIVE Final    Comment: (NOTE) SARS-CoV-2 target nucleic acids are NOT DETECTED. The SARS-CoV-2 RNA is generally detectable in upper and lower respiratory specimens during the acute phase of infection. Negative results do not preclude SARS-CoV-2 infection, do not rule out co-infections with other pathogens, and should not be used as the sole basis for treatment or other patient management decisions. Negative results must be combined with clinical observations, patient history, and  epidemiological information. The expected result is Negative. Fact Sheet for  Patients: SugarRoll.be Fact Sheet for Healthcare Providers: https://www.woods-mathews.com/ This test is not yet approved or cleared by the [place] FDA and  has been authorized for detection and/or diagnosis of SARS-CoV-2 by FDA under an Emergency Use Authorization (EUA). This EUA will remain  in effect (meaning this test can be used) for the duration of the COVID-19 declaration under Section 56 4(b)(1) of the Act, 21 U.S.C. section 360bbb-3(b)(1), unless the authorization is terminated or revoked sooner. Performed at [facility], [place] [address], [place], [place] [address]   MRSA culture     Status: None   Collection Time: 04/01/20 11:49 AM   Specimen: Body Fluid  Result Value Ref Range Status   Specimen Description   Final    NASAL SWAB Performed at [facility] [address], [place], [place] [address]    Special Requests   Final    NONE Performed at [facility], [place] [address] [place], [place] [address]    Culture   Final    NO GROWTH Performed at [facility], [place] [address], [place], [place] [address]    Report Status 04/02/2020 FINAL  Final  Culture, blood (routine x 2)     Status: None   Collection Time: 04/01/20 11:49 AM   Specimen: BLOOD  Result Value Ref Range Status   Specimen Description   Final    BLOOD PORTA CATH LEFT Performed at [facility], [place] [address], [place], [place] [address]    Special Requests   Final    BOTTLES DRAWN AEROBIC AND ANAEROBIC Blood Culture adequate volume Performed at [facility] [address], [place], [place] [address]    Culture   Final    NO GROWTH 5 DAYS Performed at [facility], [place] [address] [place], [place] [address]    Report Status 04/06/2020 FINAL  Final  Culture, blood (routine x 2)     Status: None   Collection Time: 04/01/20 11:49 AM   Specimen: BLOOD  Result Value Ref Range  Status   Specimen Description   Final    BLOOD PORTA CATH LEFT Performed at [facility], [place] [address] [place], [place] [address]    Special Requests   Final    BOTTLES DRAWN AEROBIC AND ANAEROBIC Blood Culture adequate volume Performed at [facility] [address] [place], [place] [address]    Culture   Final    NO GROWTH 5 DAYS Performed at [facility], [place] [address], [place],  [address]    Report Status 04/06/2020 FINAL  Final  MRSA PCR Screening     Status: None   Collection Time: 04/01/20  6:00 PM   Specimen: Nasopharyngeal  Result Value Ref Range Status   MRSA by PCR NEGATIVE NEGATIVE Final    Comment:        The GeneXpert MRSA Assay (FDA approved for NASAL specimens only), is one component of a comprehensive MRSA colonization surveillance program. It is not intended to diagnose MRSA infection nor to guide or monitor treatment for MRSA infections. Performed at [facility], [place] [address], [place],  [address]      Labs: BNP (last 3 results) Recent Labs    04/01/20 0552  BNP 629.4*   Basic Metabolic Panel: Recent Labs  Lab 04/04/20 0725 04/04/20 1017 04/05/20 0500 04/06/20 0930  04/07/20 0306 04/08/20 0500 04/09/20 0500  NA 141   < > 139 136 134* 137 137  K 3.5   < > 4.2 4.9 4.5 3.6 3.9  CL 103   < > 84* 92* 90* 99 94*  CO2 33*   < > 46* 39* 37* 34* 36*  GLUCOSE 116*   < > 128* 139* 110* 94 103*  BUN 23   < > 34* 35* 37* 36* 27*  CREATININE 0.43*   < > 0.75 0.61 0.65 0.55* 0.63  CALCIUM 6.3*   < > 8.6* 8.3* 8.9 8.0* 8.6*  MG 1.6*   < > 2.1 2.0 2.0 1.6* 1.9  PHOS 2.2*  --  3.9 3.9 3.3 2.5  --    < > = values in this interval not displayed.   Liver Function Tests: Recent Labs  Lab 04/05/20 0500 04/06/20 0930 04/07/20 0306 04/08/20 0500 04/09/20 0500  AST '28 31 29 21 22  ' ALT 25 33 33 28 29  ALKPHOS 62 56 64 53 58  BILITOT 0.7 0.9 1.0 0.8 0.8  PROT 6.5 6.0* 6.4* 5.4* 5.9*   ALBUMIN 2.6* 2.5* 2.9* 2.4* 2.7*   No results for input(s): LIPASE, AMYLASE in the last 168 hours. No results for input(s): AMMONIA in the last 168 hours. CBC: Recent Labs  Lab 04/04/20 1017 04/04/20 1017 04/05/20 0500 04/06/20 0930 04/07/20 0306 04/08/20 0500 04/09/20 0500  WBC 12.5*   < > 9.4 6.3 8.1 7.3 5.7  NEUTROABS 12.0*  --  8.6* 5.2 6.3 6.3  --   HGB 10.2*   < > 9.4* 9.2* 9.3* 7.8* 7.5*  HCT 33.7*   < > 31.1* 30.7* 30.3* 25.4* 24.5*  MCV 102.4*   < > 98.7 101.7* 100.0 100.4* 98.0  PLT 265   < > 232 191 190 123* 102*   < > = values in this interval not displayed.   Cardiac Enzymes: No results for input(s): CKTOTAL, CKMB, CKMBINDEX, TROPONINI in the last 168 hours. BNP: Invalid input(s): POCBNP CBG: Recent Labs  Lab 04/06/20 0432  GLUCAP 139*   D-Dimer No results for input(s): DDIMER in the last 72 hours. Hgb A1c No results for input(s): HGBA1C in the last 72 hours. Lipid Profile No results for input(s): CHOL, HDL, LDLCALC, TRIG, CHOLHDL, LDLDIRECT in the last 72 hours. Thyroid function studies No results for input(s): TSH, T4TOTAL, T3FREE, THYROIDAB in the last 72 hours.  Invalid input(s): FREET3 Anemia work up No results for input(s): VITAMINB12, FOLATE, FERRITIN, TIBC, IRON, RETICCTPCT in the last 72 hours. Urinalysis    Component Value Date/Time   COLORURINE YELLOW 04/01/2020 0552   APPEARANCEUR CLEAR 04/01/2020 0552   LABSPEC 1.019 04/01/2020 0552   PHURINE 5.0 04/01/2020 0552   GLUCOSEU NEGATIVE 04/01/2020 0552   GLUCOSEU NEGATIVE 12/27/2017 0844   HGBUR NEGATIVE 04/01/2020 0552   BILIRUBINUR NEGATIVE 04/01/2020 0552   KETONESUR NEGATIVE 04/01/2020 0552   PROTEINUR NEGATIVE 04/01/2020 0552   UROBILINOGEN 0.2 12/27/2017 0844   NITRITE NEGATIVE 04/01/2020 0552   LEUKOCYTESUR NEGATIVE 04/01/2020 0552   Sepsis Labs Invalid input(s): PROCALCITONIN,  WBC,  LACTICIDVEN Microbiology Recent Results (from the past 240 hour(s))  SARS CORONAVIRUS 2  (TAT 6-24 HRS) Nasopharyngeal Nasopharyngeal Swab     Status: None   Collection Time: 04/01/20  6:03 AM   Specimen: Nasopharyngeal Swab  Result Value Ref Range Status   SARS Coronavirus 2 NEGATIVE NEGATIVE Final    Comment: (NOTE) SARS-CoV-2 target nucleic acids are NOT DETECTED. The SARS-CoV-2 RNA  is generally detectable in upper and lower respiratory specimens during the acute phase of infection. Negative results do not preclude SARS-CoV-2 infection, do not rule out co-infections with other pathogens, and should not be used as the sole basis for treatment or other patient management decisions. Negative results must be combined with clinical observations, patient history, and epidemiological information. The expected result is Negative. Fact Sheet for Patients: SugarRoll.be Fact Sheet for Healthcare Providers: https://www.woods-mathews.com/ This test is not yet approved or cleared by the [place] FDA and  has been authorized for detection and/or diagnosis of SARS-CoV-2 by FDA under an Emergency Use Authorization (EUA). This EUA will remain  in effect (meaning this test can be used) for the duration of the COVID-19 declaration under Section 56 4(b)(1) of the Act, 21 U.S.C. section 360bbb-3(b)(1), unless the authorization is terminated or revoked sooner. Performed at [facility], [place] [address] [place], [place] [address]   MRSA culture     Status: None   Collection Time: 04/01/20 11:49 AM   Specimen: Body Fluid  Result Value Ref Range Status   Specimen Description   Final    NASAL SWAB Performed at [place] [address] [place], [place] [address]    Special Requests   Final    NONE Performed at [facility], [place] [address] [place], [place] [address]    Culture   Final    NO GROWTH Performed at [facility], [place] [address] [place], [place] [address]    Report Status  04/02/2020 FINAL  Final  Culture, blood (routine x 2)     Status: None   Collection Time: 04/01/20 11:49 AM   Specimen: BLOOD  Result Value Ref Range Status   Specimen Description   Final    BLOOD PORTA CATH LEFT Performed at [facility], [place] [address], [place], [place] [address]    Special Requests   Final    BOTTLES DRAWN AEROBIC AND ANAEROBIC Blood Culture adequate volume Performed at [place] [address] [place], [place] [address]    Culture   Final    NO GROWTH 5 DAYS Performed at [facility], [place] [address], [place], [place] [address]    Report Status 04/06/2020 FINAL  Final  Culture, blood (routine x 2)     Status: None   Collection Time: 04/01/20 11:49 AM   Specimen: BLOOD  Result Value Ref Range Status   Specimen Description   Final    BLOOD PORTA CATH LEFT Performed at [facility], [place] [address] [place], [place] [address]    Special Requests   Final    BOTTLES DRAWN AEROBIC AND ANAEROBIC Blood Culture adequate volume Performed at [place] [address], [place], [place] [address]    Culture   Final    NO GROWTH 5 DAYS Performed at [facility], [place] [address], [place], [place] [address]    Report Status 04/06/2020 FINAL  Final  MRSA PCR Screening     Status: None   Collection Time: 04/01/20  6:00 PM   Specimen: Nasopharyngeal  Result Value Ref Range Status   MRSA by PCR NEGATIVE NEGATIVE Final    Comment:        The GeneXpert MRSA Assay (FDA approved for NASAL specimens only), is one component of a comprehensive MRSA colonization surveillance program. It is not intended to diagnose MRSA infection nor to guide  or monitor treatment for MRSA infections. Performed at [facility], [place] [address] [place], [place] [address]      Time coordinating discharge: 35 minutes  SIGNED:   Cordelia Poche, MD [facility] 04/09/2020, 2:59 PM

## 2020-04-09 NOTE — Progress Notes (Signed)
Daily Progress Note   Patient Name: Ryan Contreras       Date: 04/09/2020 DOB: 06-Sep-1941  Age: 79 y.o. MRN#: [id number] Attending Physician: Mariel Aloe, MD Primary Care Physician: Cyndi Bender, PA-C Admit Date: 04/01/2020  Reason for Consultation/Follow-up: Establishing goals of care  Subjective:  patient is more confused, is only eating sips bites.   Conference call family meeting with both daughters was held today  See below.   Length of Stay: 8  Current Medications: Scheduled Meds:  . allopurinol  100 mg Oral Daily  . arformoterol  15 mcg Nebulization BID  . aspirin EC  81 mg Oral QHS  . atorvastatin  80 mg Oral QHS  . brimonidine  1 drop Both Eyes BID  . budesonide (PULMICORT) nebulizer solution  0.5 mg Nebulization BID  . chlorhexidine  15 mL Mouth Rinse BID  . Chlorhexidine Gluconate Cloth  6 each Topical Daily  . clopidogrel  75 mg Oral Daily  . docusate sodium  100 mg Oral Daily  . enoxaparin (LOVENOX) injection  40 mg Subcutaneous Q24H  . feeding supplement  1 Container Oral Q24H  . feeding supplement (ENSURE ENLIVE)  237 mL Oral BID BM  . ipratropium  0.5 mg Nebulization TID  . levalbuterol  0.63 mg Nebulization TID  . mouth rinse  15 mL Mouth Rinse q12n4p  . metoprolol succinate  12.5 mg Oral Daily  . multivitamin with minerals  1 tablet Oral Daily  . pantoprazole  40 mg Oral Daily  . QUEtiapine  50 mg Oral QHS  . sodium chloride flush  10-40 mL Intracatheter Q12H  . tamsulosin  0.4 mg Oral Daily    Continuous Infusions: . sodium chloride Stopped (04/08/20 2146)  . phenylephrine (NEO-SYNEPHRINE) Adult infusion 65 mcg/min (04/08/20 0500)  . phenylephrine (NEO-SYNEPHRINE) Adult infusion Stopped (04/08/20 1810)    PRN Meds: sodium chloride,  acetaminophen **OR** acetaminophen, albuterol, haloperidol lactate, hydrALAZINE, hydrOXYzine, lip balm, LORazepam, morphine injection, ondansetron **OR** ondansetron (ZOFRAN) IV, oxyCODONE, polyethylene glycol, prochlorperazine, sodium chloride flush  Physical Exam         Appears chronically ill Has coarse breath sounds S1 S2 muffled by adventitious lung sounds Abdomen soft not tender Has some edema Awake not alert.   Vital Signs: BP (!) 154/44   Pulse 87   Temp 98  F (36.7 C) (Oral)   Resp 16   Ht 6\' 1"  (1.854 m)   Wt 99.7 kg   SpO2 96%   BMI 29.00 kg/m  SpO2: SpO2: 96 % O2 Device: O2 Device: Nasal Cannula O2 Flow Rate: O2 Flow Rate (L/min): 3 L/min  Intake/output summary:   Intake/Output Summary (Last 24 hours) at 04/09/2020 1104 Last data filed at 04/09/2020 0900 Gross per 24 hour  Intake 1102.11 ml  Output 3575 ml  Net -2472.89 ml   LBM: Last BM Date: 04/06/20 Baseline Weight: Weight: 102 kg Most recent weight: Weight: 99.7 kg       Palliative Assessment/Data:    Flowsheet Rows     Most Recent Value  Intake Tab  Referral Department  Hospitalist  Unit at Time of Referral  ER  Palliative Care Primary Diagnosis  Cancer  Date Notified  04/01/20  Palliative Care Type  New Palliative care  Reason for referral  Clarify Goals of Care  Date of Admission  04/01/20  Date first seen by Palliative Care  04/01/20  # of days Palliative referral response time  0 Day(s)  # of days IP prior to Palliative referral  0  Clinical Assessment  Psychosocial & Spiritual Assessment  Palliative Care Outcomes      Patient Active Problem List   Diagnosis Date Noted  . Acute on chronic respiratory failure ([place]) 04/01/2020  . Tremor 03/31/2020  . Constipation 03/31/2020  . Allergic rhinitis 03/17/2020  . Primary malignant neoplasm of right lung metastatic to other site ([place]) 03/03/2020  . Encounter for antineoplastic immunotherapy 03/03/2020  . Goals of care,  counseling/discussion 03/03/2020  . Acute on chronic respiratory failure with hypoxia and hypercapnia ([place]) 02/29/2020  . COPD with acute exacerbation ([place]) 02/29/2020  . Chronic respiratory failure with hypoxia ([place]) 02/18/2020  . History of colonic polyps   . Benign neoplasm of transverse colon   . 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 Memory loss 06/26/2017  . Hypercapnic respiratory failure ([place]) 11/22/2016  . GI bleed 05/02/2016  . Anemia 05/02/2016  . Gastrointestinal bleeding, upper 04/29/2016  . Encounter for antineoplastic chemotherapy 02/15/2016  . Prostatitis, chronic 01/23/2016  . Malignant neoplasm of right upper lobe of lung ([place]) 01/21/2016  . Hilar adenopathy 01/11/2016  . Enlargement of lymph nodes   . Other nonspecific abnormal finding of lung field   . Pulmonary nodules 11/26/2015  . COPD  GOLD C 11/02/2015  . Cigarette smoker 11/02/2015  . Dyspnea 09/11/2015  . Edema 08/14/2015  . Diabetes ([place]) 04/13/2015  . Wart viral 01/30/2015  . Wellness examination 11/14/2014  . Neoplasm of uncertain behavior of skin 12/31/2012  . Bruise 07/21/2012  . Otitis media 04/19/2012  . Otitis externa 04/19/2012  . Cough 01/06/2012  . Encounter for long-term (current) use of other medications 12/16/2011  . PSA, INCREASED 08/11/2010  . SKIN RASH 07/14/2010  . TOBACCO ABUSE 03/15/2010  . CAD, NATIVE VESSEL 03/15/2010  . OTHER MALAISE AND FATIGUE 03/15/2010  . OTHER SPEC HYPERTROPHIC&ATROPHIC CONDITION SKIN 10/01/2009  . HYPOKALEMIA 06/03/2009  . Hyperlipidemia 05/14/2008  . Essential hypertension 05/14/2008  . Coronary atherosclerosis 05/14/2008  . PUD (peptic ulcer disease) 05/14/2008  . LEG PAIN 05/14/2008  . Gout 01/21/2008    Palliative Care Assessment & Plan   Patient Profile:    Assessment: 79 yo gentleman who lives at home with his daughter, has life limiting incurable illness of lung cancer that has been further complicated by  postobstructive pneumonia with continued episodes of hypercapnia and confusion.     Recommendations/Plan:  Goals of care family meeting with both daughters on the phone. We reviewed the nature of this hospitalization, the patient's serious illnesses most of which are non reversible. Patient with minimal chances of making a meaningful recovery with a good quality of life. Hence, discussions about considering comfort focused care, hospice were undertaken.   We talked about DNR DNI. Discussed that the patient will undergo significant distress and suffering in the face of a full resuscitative attempt.   We talked about hospice philosophy of care, concept of comfort measures discussed in detail. Family lives closer to [facility] and has some familiarity with hospice of [facility].   Family is in agreement to proceed with DNR DNI, focus on comfort measures and transfer to residential hospice as soon as possible. Discussed frankly but compassionately, that prognosis is markedly limited, quite possibly less than 2 weeks and that the patient will benefit most from a comfort focused approach to his care.   Code Status:    Code Status Orders  (From admission, onward)         Start     Ordered   04/01/20 1036  Full code  Continuous     04/01/20 1040        Code Status History    Date Active Date Inactive Code Status Order ID Comments User Context   02/29/2020 0501 03/01/2020 2235 Full Code [id number]  Vianne Bulls, MD ED   Advance Care Planning Activity       Prognosis:   guarded, likely less than 2 weeks.   Discharge Planning:  Hospice facility in [place].   Care plan was discussed with  Both daughters, PCCM MD Dr Lake Bells and bedside RN Judson Roch.  CSW consulted for residential hospice.    Thank you for allowing the Palliative Medicine Team to assist in the care of this patient.   Time In: 10.30 Time Out: 11.05 Total Time 35 Prolonged Time Billed No       Greater than 50%   of this time was spent counseling and coordinating care related to the above assessment and plan.  Loistine Chance, MD  Please contact Palliative Medicine Team phone at [phone] for questions and concerns.

## 2020-04-14 ENCOUNTER — Inpatient Hospital Stay: Payer: Medicare PPO | Attending: Internal Medicine

## 2020-04-15 DIAGNOSIS — J9610 Chronic respiratory failure, unspecified whether with hypoxia or hypercapnia: Secondary | ICD-10-CM | POA: Diagnosis not present

## 2020-04-15 DIAGNOSIS — J449 Chronic obstructive pulmonary disease, unspecified: Secondary | ICD-10-CM | POA: Diagnosis not present

## 2020-04-15 NOTE — Progress Notes (Signed)
Pharmacist Chemotherapy Monitoring - Follow Up Assessment    I verify that I have reviewed each item in the below checklist:  . Regimen for the patient is scheduled for the appropriate day and plan matches scheduled date. Marland Kitchen Appropriate non-routine labs are ordered dependent on drug ordered. . If applicable, additional medications reviewed and ordered per protocol based on lifetime cumulative doses and/or treatment regimen.   Plan for follow-up and/or issues identified: No . I-vent associated with next due treatment: No . MD and/or nursing notified: No  Ryan Contreras K 04/15/2020 9:52 AM

## 2020-04-17 ENCOUNTER — Telehealth: Payer: Self-pay | Admitting: *Deleted

## 2020-04-17 NOTE — Telephone Encounter (Signed)
Pt resides at in patient Hospice facility now. Appts cancelled at [facility]

## 2020-04-21 ENCOUNTER — Inpatient Hospital Stay: Payer: Medicare PPO

## 2020-04-21 ENCOUNTER — Inpatient Hospital Stay: Payer: Medicare PPO | Admitting: Internal Medicine

## 2020-04-23 ENCOUNTER — Inpatient Hospital Stay: Payer: Medicare PPO

## 2020-04-28 ENCOUNTER — Other Ambulatory Visit: Payer: Medicare PPO

## 2020-05-05 ENCOUNTER — Other Ambulatory Visit: Payer: Medicare PPO

## 2020-05-12 ENCOUNTER — Ambulatory Visit: Payer: Medicare PPO | Admitting: Physician Assistant

## 2020-05-12 ENCOUNTER — Other Ambulatory Visit: Payer: Medicare PPO

## 2020-05-12 ENCOUNTER — Ambulatory Visit: Payer: Medicare PPO

## 2020-05-12 DEATH — deceased

## 2020-05-14 ENCOUNTER — Ambulatory Visit: Payer: Medicare PPO

## 2021-05-25 IMAGING — CR DG CHEST 2V
2 series · 2 of 2 positions shown · non-contrast
Comparison: Chest x-ray 02/28/2020 and PET-CT 02/04/2020

CLINICAL DATA: Worsening shortness of breath. History of non-small
cell lung cancer.

EXAM:
CHEST - 2 VIEW

[w chest lat]
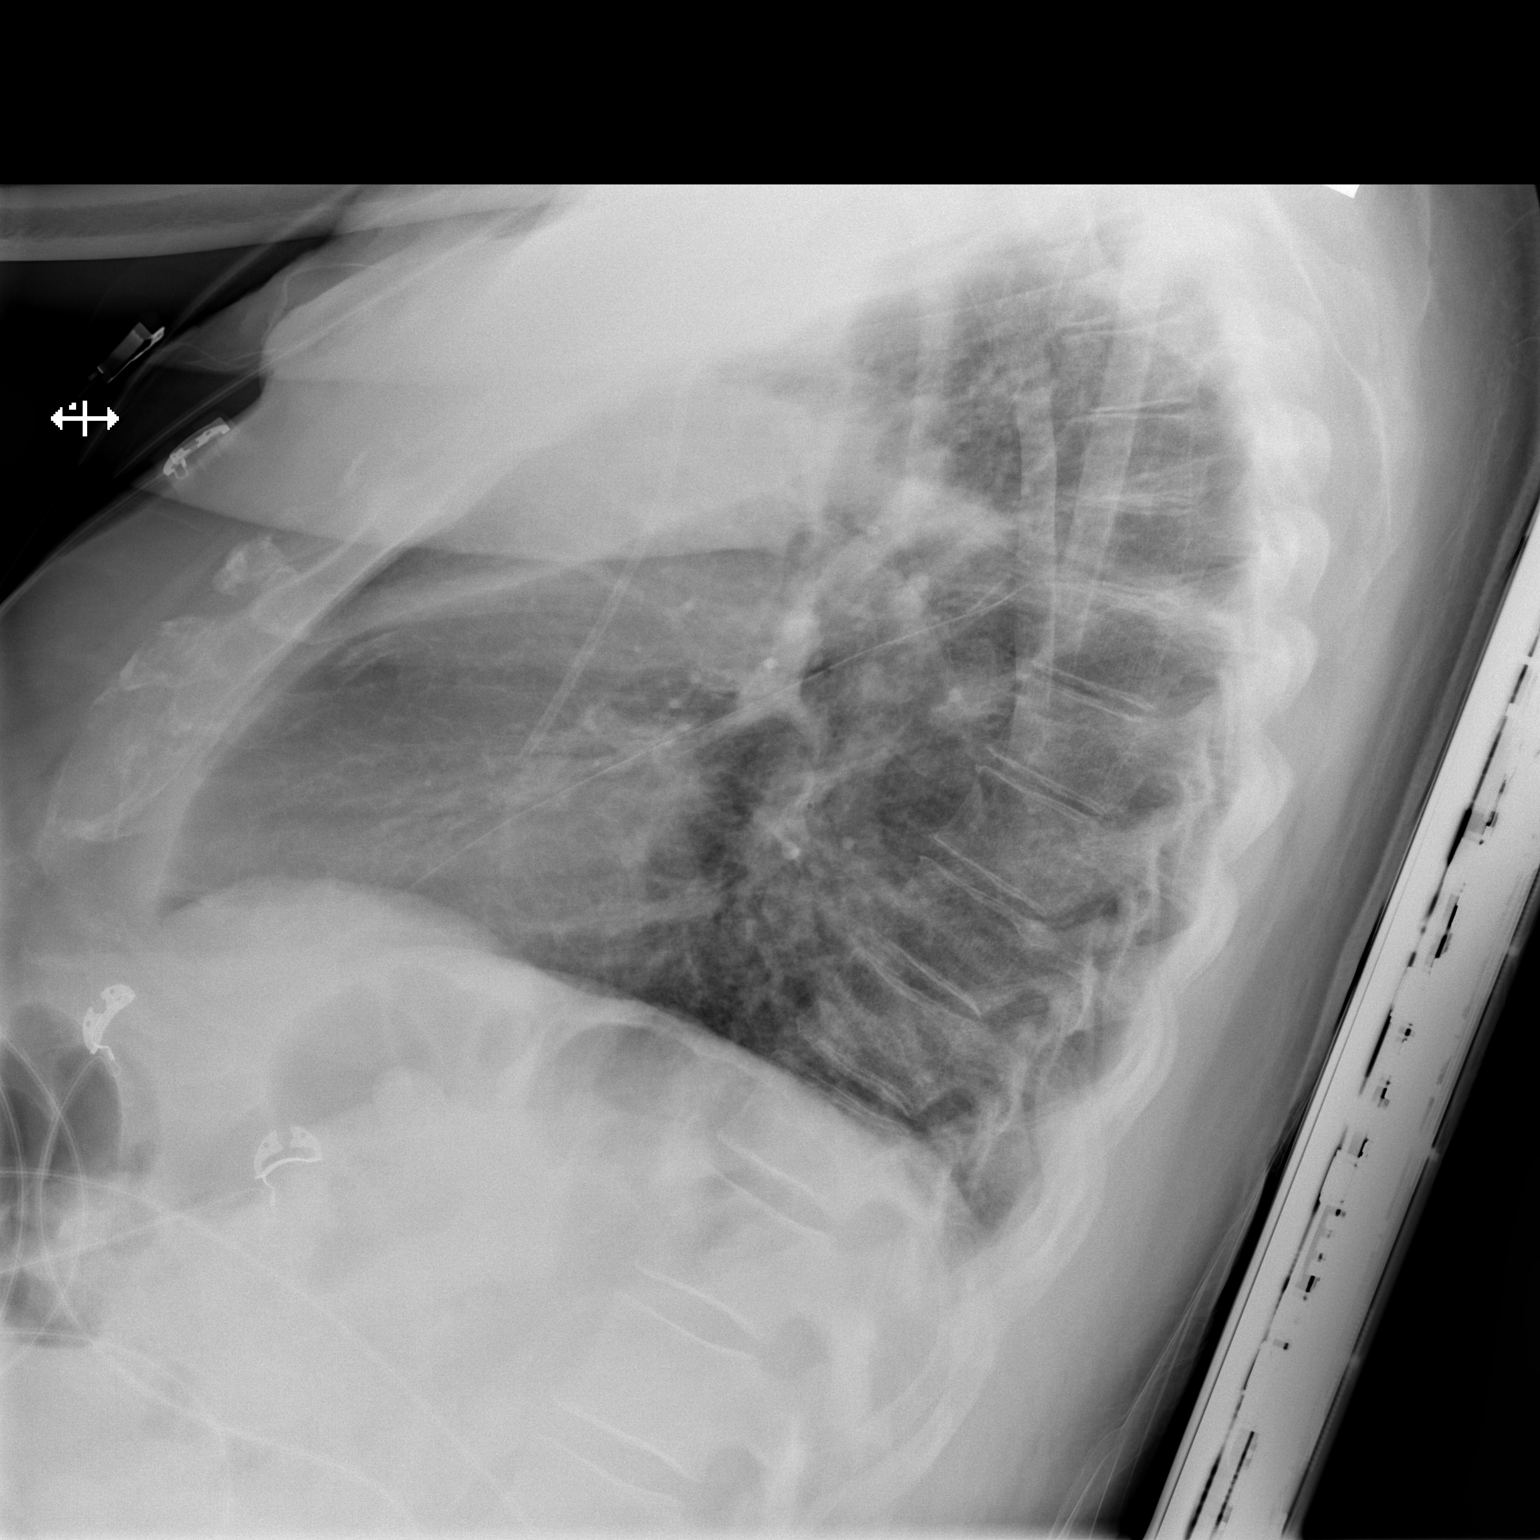

[x chest ap]
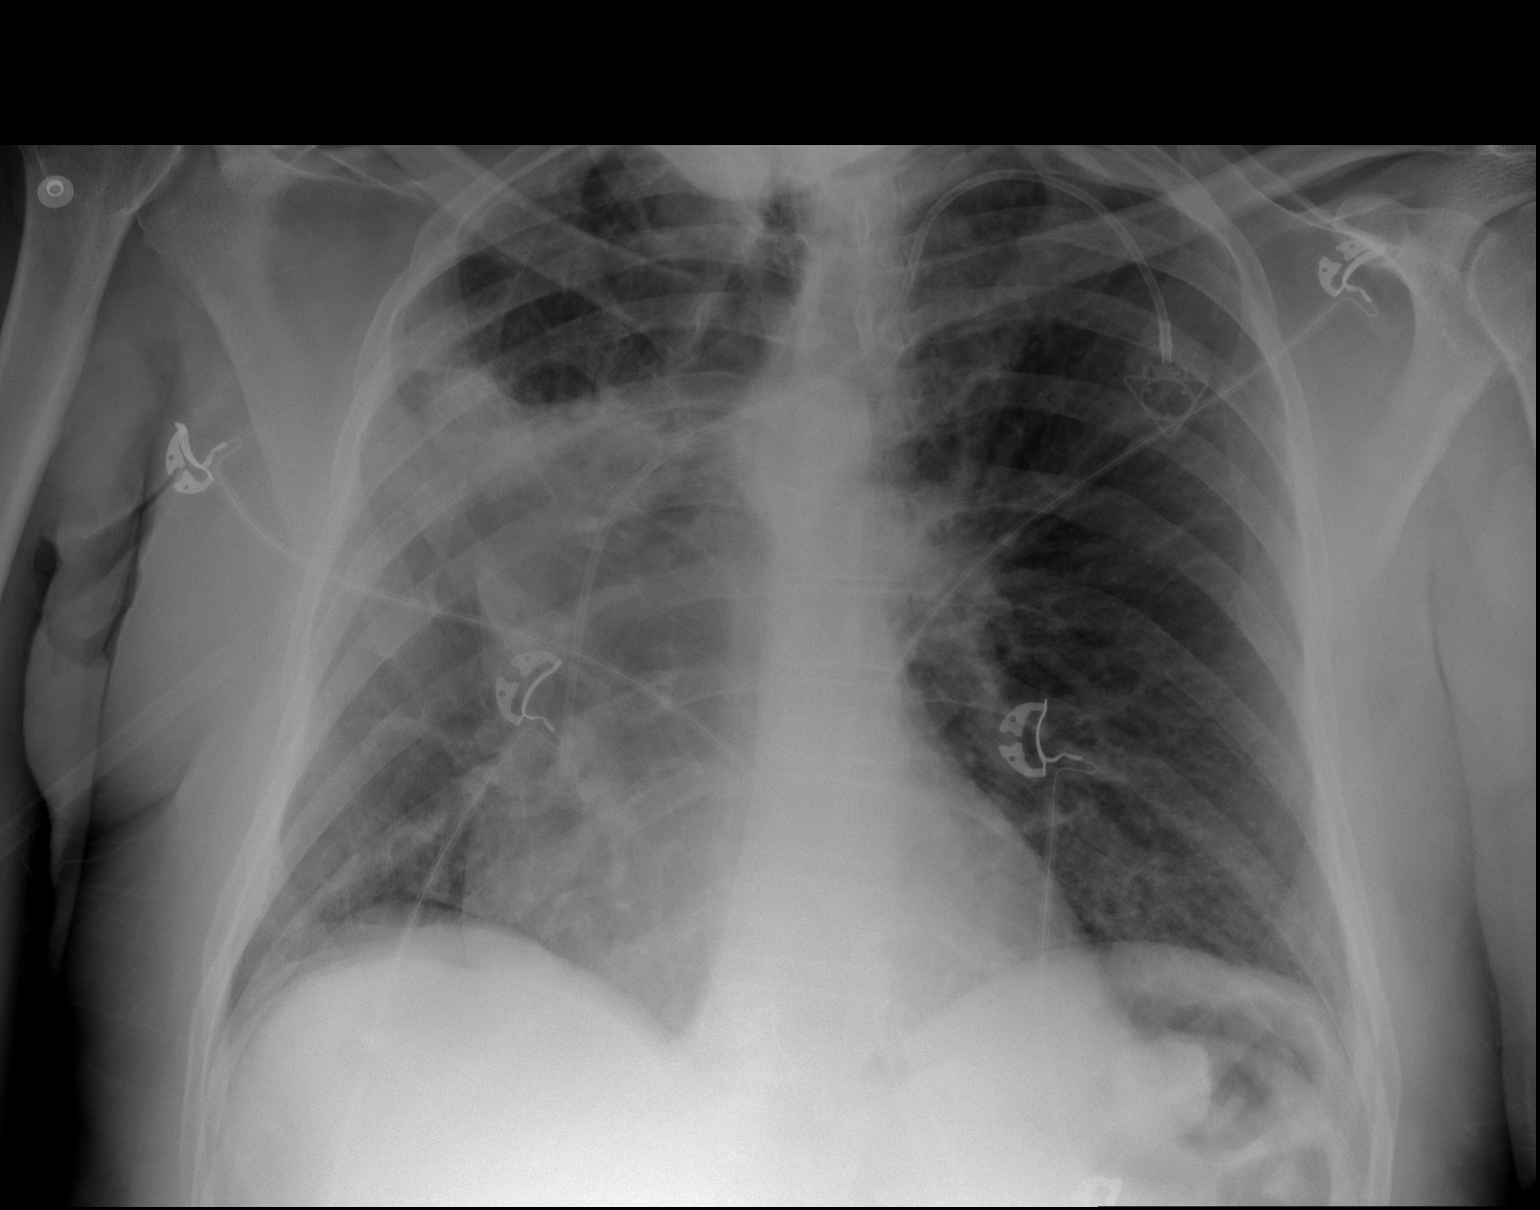

[2 of 2 positions shown; findings below may reference images not displayed]

FINDINGS: Stable right upper lobe lung mass and surrounding extensive
radiation changes with loss of volume in the right hemithorax.
Slight increase in surrounding right lung density which could
reflect pneumonitis or interstitial spread of tumor.

The left lung remains relatively clear.

Left IJ power port in good position without complicating features.
No pleural effusions or pneumothorax.
IMPRESSION: 1. Stable right upper lobe lung mass and surrounding radiation
changes.
2. Slight increase in surrounding right lung density which could
reflect pneumonitis or interstitial spread of tumor.
